# Patient Record
Sex: Female | Born: 1975 | Race: Black or African American | Hispanic: No | Smoking: Current every day smoker
Health system: Southern US, Community
[De-identification: ages and names within clinical notes are randomized; demographics above are authoritative.]

## PROBLEM LIST (undated history)

## (undated) ENCOUNTER — Emergency Department (HOSPITAL_COMMUNITY): Payer: Medicaid Other

## (undated) DIAGNOSIS — E7401 von Gierke disease: Secondary | ICD-10-CM

## (undated) DIAGNOSIS — F319 Bipolar disorder, unspecified: Secondary | ICD-10-CM

## (undated) DIAGNOSIS — F22 Delusional disorders: Secondary | ICD-10-CM

## (undated) DIAGNOSIS — F209 Schizophrenia, unspecified: Secondary | ICD-10-CM

## (undated) DIAGNOSIS — D550 Anemia due to glucose-6-phosphate dehydrogenase [G6PD] deficiency: Secondary | ICD-10-CM

## (undated) DIAGNOSIS — F99 Mental disorder, not otherwise specified: Secondary | ICD-10-CM

## (undated) DIAGNOSIS — D649 Anemia, unspecified: Secondary | ICD-10-CM

## (undated) DIAGNOSIS — F32A Depression, unspecified: Secondary | ICD-10-CM

## (undated) DIAGNOSIS — F419 Anxiety disorder, unspecified: Secondary | ICD-10-CM

## (undated) DIAGNOSIS — Z5189 Encounter for other specified aftercare: Secondary | ICD-10-CM

## (undated) HISTORY — PX: TUBAL LIGATION: SHX77

---

## 2009-03-13 ENCOUNTER — Emergency Department (HOSPITAL_COMMUNITY): Admission: EM | Admit: 2009-03-13 | Discharge: 2009-03-13 | Payer: Self-pay | Admitting: Emergency Medicine

## 2009-03-28 HISTORY — PX: CHOLECYSTECTOMY: SHX55

## 2010-03-25 ENCOUNTER — Inpatient Hospital Stay (HOSPITAL_COMMUNITY)
Admission: EM | Admit: 2010-03-25 | Discharge: 2010-03-29 | Payer: Self-pay | Source: Home / Self Care | Attending: Internal Medicine | Admitting: Internal Medicine

## 2010-03-26 ENCOUNTER — Encounter (INDEPENDENT_AMBULATORY_CARE_PROVIDER_SITE_OTHER): Payer: Self-pay | Admitting: Internal Medicine

## 2010-06-07 LAB — CBC
HCT: 17.6 % — ABNORMAL LOW (ref 36.0–46.0)
HCT: 18.8 % — ABNORMAL LOW (ref 36.0–46.0)
HCT: 30 % — ABNORMAL LOW (ref 36.0–46.0)
Hemoglobin: 4.3 g/dL — CL (ref 12.0–15.0)
Hemoglobin: 4.7 g/dL — CL (ref 12.0–15.0)
Hemoglobin: 7.9 g/dL — ABNORMAL LOW (ref 12.0–15.0)
Hemoglobin: 8 g/dL — ABNORMAL LOW (ref 12.0–15.0)
Hemoglobin: 8.8 g/dL — ABNORMAL LOW (ref 12.0–15.0)
MCH: 16.7 pg — ABNORMAL LOW (ref 26.0–34.0)
MCH: 17.1 pg — ABNORMAL LOW (ref 26.0–34.0)
MCH: 21.7 pg — ABNORMAL LOW (ref 26.0–34.0)
MCH: 21.8 pg — ABNORMAL LOW (ref 26.0–34.0)
MCH: 21.9 pg — ABNORMAL LOW (ref 26.0–34.0)
MCH: 22.3 pg — ABNORMAL LOW (ref 26.0–34.0)
MCHC: 24.4 g/dL — ABNORMAL LOW (ref 30.0–36.0)
MCHC: 25 g/dL — ABNORMAL LOW (ref 30.0–36.0)
MCHC: 28.9 g/dL — ABNORMAL LOW (ref 30.0–36.0)
MCHC: 29.3 g/dL — ABNORMAL LOW (ref 30.0–36.0)
MCHC: 29.3 g/dL — ABNORMAL LOW (ref 30.0–36.0)
MCV: 68.4 fL — ABNORMAL LOW (ref 78.0–100.0)
MCV: 68.5 fL — ABNORMAL LOW (ref 78.0–100.0)
MCV: 74.2 fL — ABNORMAL LOW (ref 78.0–100.0)
MCV: 75.2 fL — ABNORMAL LOW (ref 78.0–100.0)
Platelets: 279 10*3/uL (ref 150–400)
Platelets: 346 10*3/uL (ref 150–400)
Platelets: 425 10*3/uL — ABNORMAL HIGH (ref 150–400)
Platelets: 456 10*3/uL — ABNORMAL HIGH (ref 150–400)
RBC: 2.57 MIL/uL — ABNORMAL LOW (ref 3.87–5.11)
RBC: 2.75 MIL/uL — ABNORMAL LOW (ref 3.87–5.11)
RBC: 3.68 MIL/uL — ABNORMAL LOW (ref 3.87–5.11)
RDW: 23.6 % — ABNORMAL HIGH (ref 11.5–15.5)
RDW: 25.6 % — ABNORMAL HIGH (ref 11.5–15.5)
RDW: 25.7 % — ABNORMAL HIGH (ref 11.5–15.5)
WBC: 10 10*3/uL (ref 4.0–10.5)
WBC: 14 10*3/uL — ABNORMAL HIGH (ref 4.0–10.5)
WBC: 9.3 10*3/uL (ref 4.0–10.5)

## 2010-06-07 LAB — CROSSMATCH
ABO/RH(D): A POS
Antibody Screen: NEGATIVE
Unit division: 0
Unit division: 0
Unit division: 0

## 2010-06-07 LAB — POCT I-STAT, CHEM 8
BUN: 9 mg/dL (ref 6–23)
Calcium, Ion: 1.18 mmol/L (ref 1.12–1.32)
Chloride: 109 mEq/L (ref 96–112)
Creatinine, Ser: 0.9 mg/dL (ref 0.4–1.2)
Glucose, Bld: 80 mg/dL (ref 70–99)
HCT: 18 % — ABNORMAL LOW (ref 36.0–46.0)
Hemoglobin: 6.1 g/dL — CL (ref 12.0–15.0)
Potassium: 4.1 mEq/L (ref 3.5–5.1)
Sodium: 143 mEq/L (ref 135–145)
TCO2: 24 mmol/L (ref 0–100)

## 2010-06-07 LAB — COMPREHENSIVE METABOLIC PANEL
AST: 16 U/L (ref 0–37)
Albumin: 2.9 g/dL — ABNORMAL LOW (ref 3.5–5.2)
BUN: 7 mg/dL (ref 6–23)
CO2: 23 mEq/L (ref 19–32)
Calcium: 8.1 mg/dL — ABNORMAL LOW (ref 8.4–10.5)
Chloride: 109 mEq/L (ref 96–112)
Creatinine, Ser: 0.69 mg/dL (ref 0.4–1.2)
Creatinine, Ser: 0.94 mg/dL (ref 0.4–1.2)
GFR calc Af Amer: >60 mL/min (ref >60)
GFR calc Af Amer: >60 mL/min (ref >60)
GFR calc non Af Amer: >60 mL/min (ref >60)
Glucose, Bld: 90 mg/dL (ref 70–99)
Potassium: 3.3 mEq/L — ABNORMAL LOW (ref 3.5–5.1)
Total Bilirubin: 0.6 mg/dL (ref 0.3–1.2)

## 2010-06-07 LAB — URINE MICROSCOPIC-ADD ON

## 2010-06-07 LAB — DIFFERENTIAL
Basophils Absolute: 0.1 10*3/uL (ref 0.0–0.1)
Basophils Absolute: 0.1 10*3/uL (ref 0.0–0.1)
Basophils Relative: 1 % (ref 0–1)
Eosinophils Absolute: 0.1 10*3/uL (ref 0.0–0.7)
Eosinophils Absolute: 0.2 10*3/uL (ref 0.0–0.7)
Eosinophils Relative: 2 % (ref 0–5)
Lymphocytes Relative: 12 % (ref 12–46)
Lymphocytes Relative: 24 % (ref 12–46)
Lymphs Abs: 2.2 10*3/uL (ref 0.7–4.0)
Monocytes Absolute: 1 10*3/uL (ref 0.1–1.0)
Monocytes Absolute: 1 10*3/uL (ref 0.1–1.0)
Monocytes Relative: 11 % (ref 3–12)
Neutro Abs: 5.8 10*3/uL (ref 1.7–7.7)
Neutrophils Relative %: 62 % (ref 43–77)
Neutrophils Relative %: 79 % — ABNORMAL HIGH (ref 43–77)

## 2010-06-07 LAB — RAPID URINE DRUG SCREEN, HOSP PERFORMED
Amphetamines: NOT DETECTED
Barbiturates: NOT DETECTED
Benzodiazepines: NOT DETECTED
Cocaine: NOT DETECTED
Opiates: NOT DETECTED
Tetrahydrocannabinol: POSITIVE — AB

## 2010-06-07 LAB — URINALYSIS, ROUTINE W REFLEX MICROSCOPIC
Bilirubin Urine: NEGATIVE
Glucose, UA: NEGATIVE mg/dL
Hgb urine dipstick: NEGATIVE
Nitrite: NEGATIVE
Protein, ur: NEGATIVE mg/dL
Specific Gravity, Urine: 1.023 (ref 1.005–1.030)
Urobilinogen, UA: 1 mg/dL (ref 0.0–1.0)
pH: 7 (ref 5.0–8.0)

## 2010-06-07 LAB — PREGNANCY, URINE: Preg Test, Ur: NEGATIVE

## 2010-06-07 LAB — PREPARE RBC (CROSSMATCH)

## 2010-06-07 LAB — ABO/RH: ABO/RH(D): A POS

## 2010-06-07 LAB — VITAMIN B12: Vitamin B-12: 292 pg/mL (ref 211–911)

## 2010-06-07 LAB — LACTATE DEHYDROGENASE
LDH: 113 U/L (ref 94–250)
LDH: 212 U/L (ref 94–250)

## 2010-06-07 LAB — IRON AND TIBC
Iron: <10 ug/dL — ABNORMAL LOW (ref 42–135)
UIBC: 403 ug/dL

## 2010-06-07 LAB — FERRITIN: Ferritin: 2 ng/mL — ABNORMAL LOW (ref 10–291)

## 2010-06-07 LAB — BASIC METABOLIC PANEL
CO2: 26 mEq/L (ref 19–32)
Calcium: 8.3 mg/dL — ABNORMAL LOW (ref 8.4–10.5)
Chloride: 111 mEq/L (ref 96–112)
Creatinine, Ser: 0.72 mg/dL (ref 0.4–1.2)
Glucose, Bld: 86 mg/dL (ref 70–99)

## 2010-06-07 LAB — FOLATE: Folate: 7.8 ng/mL

## 2010-06-07 LAB — CARDIAC PANEL(CRET KIN+CKTOT+MB+TROPI)
CK, MB: 0.4 ng/mL (ref 0.3–4.0)
Relative Index: INVALID (ref 0.0–2.5)
Relative Index: INVALID (ref 0.0–2.5)
Total CK: 47 U/L (ref 7–177)
Total CK: 61 U/L (ref 7–177)
Troponin I: <0.01 ng/mL (ref 0.00–0.06)

## 2010-06-07 LAB — GLUCOSE, CAPILLARY: Glucose-Capillary: 87 mg/dL (ref 70–99)

## 2010-06-07 LAB — CK: Total CK: 59 U/L (ref 7–177)

## 2010-06-07 LAB — MAGNESIUM: Magnesium: 2.1 mg/dL (ref 1.5–2.5)

## 2010-06-07 LAB — RETICULOCYTES: Retic Ct Pct: 1.8 % (ref 0.4–3.1)

## 2010-06-07 LAB — OCCULT BLOOD, POC DEVICE: Fecal Occult Bld: NEGATIVE

## 2010-06-12 ENCOUNTER — Emergency Department (HOSPITAL_COMMUNITY): Payer: Self-pay

## 2010-06-12 ENCOUNTER — Inpatient Hospital Stay (HOSPITAL_COMMUNITY)
Admission: EM | Admit: 2010-06-12 | Discharge: 2010-06-15 | DRG: 812 | Disposition: A | Discharge status: Home / Self Care | Payer: Self-pay | Attending: Internal Medicine | Admitting: Internal Medicine

## 2010-06-12 DIAGNOSIS — F17200 Nicotine dependence, unspecified, uncomplicated: Secondary | ICD-10-CM | POA: Diagnosis present

## 2010-06-12 DIAGNOSIS — R51 Headache: Secondary | ICD-10-CM | POA: Diagnosis present

## 2010-06-12 DIAGNOSIS — D6489 Other specified anemias: Principal | ICD-10-CM | POA: Diagnosis present

## 2010-06-12 DIAGNOSIS — E860 Dehydration: Secondary | ICD-10-CM | POA: Diagnosis present

## 2010-06-12 DIAGNOSIS — R112 Nausea with vomiting, unspecified: Secondary | ICD-10-CM | POA: Diagnosis present

## 2010-06-12 DIAGNOSIS — K5900 Constipation, unspecified: Secondary | ICD-10-CM | POA: Diagnosis present

## 2010-06-12 DIAGNOSIS — D259 Leiomyoma of uterus, unspecified: Secondary | ICD-10-CM | POA: Diagnosis present

## 2010-06-12 DIAGNOSIS — R109 Unspecified abdominal pain: Secondary | ICD-10-CM | POA: Diagnosis present

## 2010-06-12 DIAGNOSIS — N920 Excessive and frequent menstruation with regular cycle: Secondary | ICD-10-CM | POA: Diagnosis present

## 2010-06-12 DIAGNOSIS — R42 Dizziness and giddiness: Secondary | ICD-10-CM | POA: Diagnosis present

## 2010-06-12 DIAGNOSIS — D551 Anemia due to other disorders of glutathione metabolism: Secondary | ICD-10-CM | POA: Diagnosis present

## 2010-06-12 DIAGNOSIS — I959 Hypotension, unspecified: Secondary | ICD-10-CM | POA: Diagnosis not present

## 2010-06-12 DIAGNOSIS — N390 Urinary tract infection, site not specified: Secondary | ICD-10-CM | POA: Diagnosis present

## 2010-06-12 LAB — COMPREHENSIVE METABOLIC PANEL
ALT: 10 U/L (ref 0–35)
AST: 18 U/L (ref 0–37)
Alkaline Phosphatase: 44 U/L (ref 39–117)
CO2: 24 mEq/L (ref 19–32)
Calcium: 8.8 mg/dL (ref 8.4–10.5)
GFR calc Af Amer: >60 mL/min (ref >60)
GFR calc non Af Amer: >60 mL/min (ref >60)
Glucose, Bld: 84 mg/dL (ref 70–99)
Potassium: 4 mEq/L (ref 3.5–5.1)
Sodium: 136 mEq/L (ref 135–145)

## 2010-06-12 LAB — DIFFERENTIAL
Basophils Absolute: 0.1 10*3/uL (ref 0.0–0.1)
Eosinophils Absolute: 0.3 10*3/uL (ref 0.0–0.7)
Lymphocytes Relative: 25 % (ref 12–46)
Monocytes Relative: 8 % (ref 3–12)
Neutrophils Relative %: 63 % (ref 43–77)

## 2010-06-12 LAB — URINALYSIS, ROUTINE W REFLEX MICROSCOPIC
Bilirubin Urine: NEGATIVE
Glucose, UA: NEGATIVE mg/dL
Ketones, ur: NEGATIVE mg/dL
Protein, ur: NEGATIVE mg/dL
pH: 6 (ref 5.0–8.0)

## 2010-06-12 LAB — CBC
HCT: 22.6 % — ABNORMAL LOW (ref 36.0–46.0)
Hemoglobin: 6.1 g/dL — CL (ref 12.0–15.0)
MCHC: 27 g/dL — ABNORMAL LOW (ref 30.0–36.0)
RBC: 3.26 MIL/uL — ABNORMAL LOW (ref 3.87–5.11)
WBC: 8.5 10*3/uL (ref 4.0–10.5)

## 2010-06-12 LAB — URINE MICROSCOPIC-ADD ON

## 2010-06-12 LAB — LIPASE, BLOOD: Lipase: 27 U/L (ref 11–59)

## 2010-06-12 LAB — OCCULT BLOOD, POC DEVICE: Fecal Occult Bld: NEGATIVE

## 2010-06-12 LAB — SAMPLE TO BLOOD BANK

## 2010-06-12 MED ORDER — IOHEXOL 300 MG/ML  SOLN
100.0000 mL | Freq: Once | INTRAMUSCULAR | Status: AC | PRN
  Administered 2010-06-12: 100 mL via INTRAVENOUS

## 2010-06-12 NOTE — H&P (Signed)
Valerie Lee, Valerie Lee                 ACCOUNT NO.:  [id number]  MEDICAL RECORD NO.:  [id number]           PATIENT TYPE:  I  LOCATION:  1320                         FACILITY:  [facility]  PHYSICIAN:  Andreas Blower, MD       DATE OF BIRTH:  1976-02-23  DATE OF ADMISSION:  06/12/2010 DATE OF DISCHARGE:                             HISTORY & PHYSICAL   The patient does not have a primary care physician.  CHIEF COMPLAINT:  Headache, nausea, vomiting, and abdominal pain.  HISTORY OF PRESENT ILLNESS:  Valerie Lee is a 35 year old African American female with history of anemia, history of uterine fibroids, menorrhagia secondary to fibroids, has received multiple blood transfusions in the past, history of headaches and G6PD deficiency, who presents with the above complaints.  The patient was recently hospitalized from December 30 until March 29, 2010, for anemia, at which time she had received 3 units of packed red blood cells.  Her hemoglobin had improved to 8.8. She had initially presented with a hemoglobin of 4.3.  Subsequently, prior to discharge, the patient was given strict instructions to establish with a PCP and a gynecologist; however, the patient has not done that due to financial reasons.  She again presents with similar complaints as her previous hospitalization.  In the ER, she was again found to be anemic with a hemoglobin of 6.1.  As a result, the hospitalist service was asked to admit the patient for further management.  She denies any recent fevers or chills.  Does report nausea, vomiting over the last 2 to 3 days, mainly vomits clear emesis. Denies any blood when she vomits.  She does report that in the last 2 to 3 days, she is able to eat food and is able to keep things down.  She also complains about having a headache initially thought that it was due to her migraines.  Her headache since then has improved.  The headache is located mainly in the posterior region.  She does report  has been constipated, has not had a bowel movement in the last 2 days.  The patient denies any chest pain or shortness of breath.  REVIEW OF SYSTEMS:  All systems were reviewed with the patient and are positive as per HPI.  Otherwise all other systems are negative.  PAST MEDICAL HISTORY: 1. G6PD deficiency. 2. Anemia likely due to uterine fibroids and menorrhagia.  The patient     reports that she has had multiple blood transfusions.  Reports that     she has had at least 7 transfusions so far. 3. Menorrhagia secondary to uterine fibroids. 4. Asthma. 5. Cholelithiasis. 6. Uterine fibroids. 7. Tobacco use.  SOCIAL HISTORY:  The patient smokes half pack per day.  Denies any alcohol use.  Denies any illegal drugs or substances.  Currently not working.  Has recently moved from [place] over the last year.  FAMILY HISTORY:  Significant for having 2 children who have asthma, and mother has asthma.  Mother also has breast cancer and bipolar disorder.  HOME MEDICATIONS: 1. Tylenol Extra Strength 1000 mg every 6 hours as needed.  2. Iron over-the-counter 1 tablet p.o. twice daily. 3. Albuterol inhaler 2 puffs every 4 hours as needed for shortness of     breath.  PHYSICAL EXAM:  VITALS:  Temperature is 98.4; blood pressure is 91/55, initially was 117/74; heart rate 77; respiration 18; saturating at 100% on room air. GENERAL:  The patient was alert, oriented, did not to be any acute distress, was lying in bed comfortably. HEENT:  Extraocular motions are intact.  Pupils equal, round.  Moist mucous membranes. NECK:  Supple. HEART:  Regular with S1 and S2. LUNGS:  Clear to auscultation bilaterally. ABDOMEN:  Soft, nondistended.  Positive bowel sounds.  Had some mild tenderness in the epigastric region.  No guarding or rebound tenderness. EXTREMITIES:  Good peripheral pulses with trace edema. NEURO:  Cranial nerves II-XII grossly intact.  Had 5/5 motor strength in upper as well as  lower extremities.  RADIOLOGY/IMAGING: 1. The patient had a CT of the head without contrast which showed     normal examination. 2. The patient had a CT of the abdomen and pelvis, with contrast,     which showed cholelithiasis.  Minimal pericholecystic fluid with     improvement.  Minimal diffuse gallbladder wall thickening and     enhancement unchanged.  Stable enlarged uterus containing multiple     enhancing fibroids.  LABORATORY DATA:  CBC shows white count of 8.5, hemoglobin 6.1, hematocrit 26.7, platelet count 383.  Electrolytes normal with a creatinine of 0.64, lipase is 2.  Liver function tests normal.  UA was negative for nitrates, had small leukocytes, many squamous epithelial cells.  Fecal occult is negative.  Urine culture is pending.  ASSESSMENT AND PLAN: 1. Anemia, symptomatic at this time.  Will transfuse her 2 units of     blood, suspect her anemia is most likely due to menorrhagia and     G6PD deficiency. 2. Nausea and vomiting, most likely secondary to severe anemia.  The     patient did not have any pain in the right upper quadrant with     palpation, suspect less likely due to cholelithiasis, maybe due to     uterine fibroids. 3. Cholelithiasis based on CT scan, unchanged from previous imaging.     Her G6PD deficiency may be contributing to her cholelithiasis. 4. Menorrhagia secondary to fibroids:  This has been an ongoing issue     for the patient.  Suspect the patient will need a gynecology     evaluation during the hospitalization for long-term management of     her anemia. 5. Uterine fibroids:  Management as indicated above, may need a     gynecology evaluation. 6. G6PD deficiency, stable. 7. Mild dehydration:  Will give her IV fluids. 8. Mild hypotension secondary to anemia:  Will give her IV fluids. 9. Constipation:  Will have her on a bowel regimen. 10.Tobacco use: Encouraged cessation. 11.Prophylaxis:  Sequential compressive devices for deep venous      thrombosis prophylaxis.  No heparin due to anemia. 12.Code status:  The patient is Full Code.  Time spent on admission, talking to the patient and coordinating care was 45 minutes.   Andreas Blower, MD   SR/MEDQ  D:  06/12/2010  T:  06/12/2010  Job:  [id number]  Electronically Signed by Wardell Heath Sunnie Odden  on 06/12/2010 11:21:55 PM

## 2010-06-13 LAB — CBC
MCHC: 29 g/dL — ABNORMAL LOW (ref 30.0–36.0)
MCV: 72.5 fL — ABNORMAL LOW (ref 78.0–100.0)
Platelets: 289 10*3/uL (ref 150–400)
RDW: 23.1 % — ABNORMAL HIGH (ref 11.5–15.5)
WBC: 10.7 10*3/uL — ABNORMAL HIGH (ref 4.0–10.5)

## 2010-06-13 LAB — BASIC METABOLIC PANEL
BUN: 5 mg/dL — ABNORMAL LOW (ref 6–23)
Calcium: 8.4 mg/dL (ref 8.4–10.5)
GFR calc non Af Amer: >60 mL/min (ref >60)
Potassium: 3.5 mEq/L (ref 3.5–5.1)

## 2010-06-14 LAB — URINE CULTURE: Colony Count: 75000

## 2010-06-14 LAB — CBC
HCT: 26.6 % — ABNORMAL LOW (ref 36.0–46.0)
Hemoglobin: 7.6 g/dL — ABNORMAL LOW (ref 12.0–15.0)
MCH: 21 pg — ABNORMAL LOW (ref 26.0–34.0)
MCV: 73.5 fL — ABNORMAL LOW (ref 78.0–100.0)
Platelets: 235 10*3/uL (ref 150–400)
RBC: 3.62 MIL/uL — ABNORMAL LOW (ref 3.87–5.11)

## 2010-06-14 LAB — URINALYSIS, ROUTINE W REFLEX MICROSCOPIC
Bilirubin Urine: NEGATIVE
Glucose, UA: NEGATIVE mg/dL
Hgb urine dipstick: NEGATIVE
Ketones, ur: NEGATIVE mg/dL
Protein, ur: NEGATIVE mg/dL
pH: 7.5 (ref 5.0–8.0)

## 2010-06-14 LAB — BASIC METABOLIC PANEL
BUN: 3 mg/dL — ABNORMAL LOW (ref 6–23)
CO2: 26 mEq/L (ref 19–32)
Chloride: 111 mEq/L (ref 96–112)
Creatinine, Ser: 0.67 mg/dL (ref 0.4–1.2)
Glucose, Bld: 85 mg/dL (ref 70–99)
Potassium: 3.9 mEq/L (ref 3.5–5.1)

## 2010-06-15 LAB — CBC
HCT: 31.4 % — ABNORMAL LOW (ref 36.0–46.0)
Hemoglobin: 9.6 g/dL — ABNORMAL LOW (ref 12.0–15.0)
MCH: 22.9 pg — ABNORMAL LOW (ref 26.0–34.0)
MCHC: 30.6 g/dL (ref 30.0–36.0)
RBC: 4.19 MIL/uL (ref 3.87–5.11)

## 2010-06-15 LAB — URINE CULTURE
Culture  Setup Time: 201203191503
Culture: NO GROWTH
Special Requests: NEGATIVE

## 2010-06-15 LAB — BASIC METABOLIC PANEL
CO2: 25 mEq/L (ref 19–32)
Calcium: 8.3 mg/dL — ABNORMAL LOW (ref 8.4–10.5)
Chloride: 110 mEq/L (ref 96–112)
Creatinine, Ser: 0.58 mg/dL (ref 0.4–1.2)
GFR calc Af Amer: >60 mL/min (ref >60)
Glucose, Bld: 85 mg/dL (ref 70–99)
Sodium: 139 mEq/L (ref 135–145)

## 2010-06-15 LAB — CROSSMATCH: Unit division: 0

## 2010-06-17 NOTE — Discharge Summary (Signed)
Valerie Lee, Valerie Lee                 ACCOUNT NO.:  [id number]  MEDICAL RECORD NO.:  [id number]           PATIENT TYPE:  I  LOCATION:  1320                         FACILITY:  [facility]  PHYSICIAN:  Thad Ranger, MD       DATE OF BIRTH:  04-07-1975  DATE OF ADMISSION:  06/12/2010 DATE OF DISCHARGE:  06/15/2010                              DISCHARGE SUMMARY   DISCHARGE DIAGNOSES: 1. Symptomatic anemia, most likely secondary to menorrhagia, uterine     fibroids, and glucose-6-phosphate transporter deficiency. 2. Nausea and vomiting, improved. 3. Uterine fibroids. 4. Dehydration, improved. 5. Mild vertigo, improved. 6. Tobacco use.  DISCHARGE MEDICATIONS: 1. Ferrous gluconate 325 mg p.o. b.i.d. 2. Meclizine 25 mg p.o. b.i.d. for 5 days then as needed for vertigo. 3. Oxycodone/APAP 5/325 mg 1-2 tablets every 6 hours as needed for     pain, headache. 4. Tylenol Extra Strength 500 mg 2 tablets p.o. every 6 hours as     needed for pain. 5. Ventolin inhaler 2 puffs inhaled q.4 h. as needed for shortness of     breath.  BRIEF HISTORY OF PRESENT ILLNESS:  At the time of admission briefly, Valerie Lee is a 35 year old female with a history of anemia, uterine fibroids, menorrhagia secondary to fibroids has received multiple blood transfusions in the past, history of headache, and G6PT deficiency presented with above complaints.  She was recently hospitalized on December 30th through January 2nd for similar presentation with anemia. For details, please refer to the admission note dictated by Dr. Andreas Blower on June 12, 2010.  RADIOLOGICAL DATA: 1. CT head without contrast, March 17th, normal exam. 2. CT abdomen and pelvis, March 17th, showed,     a.     Cholelithiasis.     b.     Minimal pericholecystic fluid with improvement.     c.     Minimal diffuse gallbladder wall thickening and enhancement,      unchanged.     d.     Stable enlarged uterus, containing multiple enhancing  fibroids.  BRIEF HOSPITALIZATION COURSE: 1. Symptomatic anemia secondary to menorrhagia and G6PT deficiency.     The patient got transfused 3 units of packed RBC transfusion.  At     the time of admission, the hemoglobin was 6.1 and at the time of     discharge has improved to 9.6 with hematocrit of 31.4.  CT abdomen     and pelvis did show multiple enhancing fibroids.  The patient     stated that after discharge from the previous admission, she did     not follow up with Gynecology as was recommended at that time in     January 2012.  PCP was established for the patient at [facility]     and importance of establishing with GYN was also discussed with the     patient. She was also given 1 IV Venofer during the     hospitalization. 2. Uterine fibroids and menorrhagia.  Currently, no active bleeding.     However, it was stressed to the patient extensively to obtain  outpatient GYN evaluation at [facility].  The patient     may eventually require hysterectomy given her multiple admissions     and transfusions for symptomatic anemia.  Headaches, multifactorial     in nature with the history of chronic headaches.  At this point,     has improved.  The patient is on pain medications for the pain.  CT     head was obtained which was negative for any acute intracranial     abnormality. 3. History of G6PT deficiency.  No hemolysis.  The patient's     hematocrit appropriately improved after the blood transfusion. 4. Tobacco abuse.  The patient was encouraged tobacco cessation and     counseled.  She, however, declined NicoDerm patches at the time of     discharge.  PHYSICAL EXAMINATION:  VITAL SIGNS:  At the time of discharge, temperature 98.2, pulse 56, respirations 16, blood pressure 98/60, O2 saturations 100% on room air. GENERAL:  The patient is alert, awake, and oriented x3, not in acute distress. HEENT:  Anicteric sclerae, pale conjunctivae.  Pupils reactive to light and  accommodation.  EOMI. NECK:  Supple.  No lymphadenopathy.  No JVD. CVS:  S1 and S2 clear. CHEST:  Clear to auscultation bilaterally. ABDOMEN:  Soft, nontender, nondistended.  Normal bowel sounds. EXTREMITIES:  No cyanosis, clubbing, or edema noted on upper or lower extremities.  DISCHARGE FOLLOWUP:  At the [facility], Dr. Daphine Deutscher on April 24 at 8:30 a.m.  She has eligibility appointment on April 12th at 10:40 a.m.  This was explained in great detail to the patient to keep up with her appointments.  DISCHARGE TIME:  35 minutes.     Thad Ranger, MD     RR/MEDQ  D:  06/15/2010  T:  06/15/2010  Job:  [id number]  Electronically Signed by Andres Labrum Rithik Odea  on 06/17/2010 05:37:50 PM

## 2010-07-14 ENCOUNTER — Emergency Department (HOSPITAL_COMMUNITY): Payer: Self-pay

## 2010-07-14 ENCOUNTER — Emergency Department (HOSPITAL_COMMUNITY)
Admission: EM | Admit: 2010-07-14 | Discharge: 2010-07-14 | Disposition: A | Discharge status: Home / Self Care | Payer: Self-pay | Attending: Emergency Medicine | Admitting: Emergency Medicine

## 2010-07-14 DIAGNOSIS — R300 Dysuria: Secondary | ICD-10-CM | POA: Insufficient documentation

## 2010-07-14 DIAGNOSIS — K8050 Calculus of bile duct without cholangitis or cholecystitis without obstruction: Secondary | ICD-10-CM | POA: Insufficient documentation

## 2010-07-14 DIAGNOSIS — D551 Anemia due to other disorders of glutathione metabolism: Secondary | ICD-10-CM | POA: Insufficient documentation

## 2010-07-14 DIAGNOSIS — R112 Nausea with vomiting, unspecified: Secondary | ICD-10-CM | POA: Insufficient documentation

## 2010-07-14 DIAGNOSIS — R42 Dizziness and giddiness: Secondary | ICD-10-CM | POA: Insufficient documentation

## 2010-07-14 DIAGNOSIS — N83209 Unspecified ovarian cyst, unspecified side: Secondary | ICD-10-CM | POA: Insufficient documentation

## 2010-07-14 DIAGNOSIS — D259 Leiomyoma of uterus, unspecified: Secondary | ICD-10-CM | POA: Insufficient documentation

## 2010-07-14 DIAGNOSIS — R109 Unspecified abdominal pain: Secondary | ICD-10-CM | POA: Insufficient documentation

## 2010-07-14 LAB — CBC
HCT: 32.8 % — ABNORMAL LOW (ref 36.0–46.0)
Hemoglobin: 10.4 g/dL — ABNORMAL LOW (ref 12.0–15.0)
MCH: 27.2 pg (ref 26.0–34.0)
MCHC: 31.7 g/dL (ref 30.0–36.0)
MCV: 85.9 fL (ref 78.0–100.0)
Platelets: 322 10*3/uL (ref 150–400)
RBC: 3.82 MIL/uL — ABNORMAL LOW (ref 3.87–5.11)
RDW: 28.8 % — ABNORMAL HIGH (ref 11.5–15.5)
WBC: 9.6 10*3/uL (ref 4.0–10.5)

## 2010-07-14 LAB — DIFFERENTIAL
Basophils Absolute: 0.1 10*3/uL (ref 0.0–0.1)
Eosinophils Absolute: 0.2 10*3/uL (ref 0.0–0.7)
Lymphs Abs: 2 10*3/uL (ref 0.7–4.0)
Monocytes Relative: 6 % (ref 3–12)
Neutro Abs: 6.7 10*3/uL (ref 1.7–7.7)

## 2010-07-14 LAB — COMPREHENSIVE METABOLIC PANEL
ALT: 12 U/L (ref 0–35)
AST: 20 U/L (ref 0–37)
Albumin: 3.6 g/dL (ref 3.5–5.2)
Calcium: 9.1 mg/dL (ref 8.4–10.5)
Creatinine, Ser: 0.75 mg/dL (ref 0.4–1.2)
GFR calc Af Amer: >60 mL/min (ref >60)
GFR calc non Af Amer: >60 mL/min (ref >60)
Sodium: 139 mEq/L (ref 135–145)
Total Protein: 6.9 g/dL (ref 6.0–8.3)

## 2010-07-14 LAB — URINALYSIS, ROUTINE W REFLEX MICROSCOPIC
Bilirubin Urine: NEGATIVE
Glucose, UA: NEGATIVE mg/dL
Hgb urine dipstick: NEGATIVE
Nitrite: NEGATIVE
Specific Gravity, Urine: 1.012 (ref 1.005–1.030)
pH: 7 (ref 5.0–8.0)

## 2010-07-14 LAB — POCT PREGNANCY, URINE: Preg Test, Ur: NEGATIVE

## 2010-07-14 LAB — URINE MICROSCOPIC-ADD ON

## 2010-07-14 MED ORDER — IOHEXOL 300 MG/ML  SOLN
100.0000 mL | Freq: Once | INTRAMUSCULAR | Status: AC | PRN
  Administered 2010-07-14: 100 mL via INTRAVENOUS

## 2010-07-20 NOTE — Consult Note (Signed)
Valerie Lee                 ACCOUNT NO.:  [id number]  MEDICAL RECORD NO.:  [id number]          PATIENT TYPE:  LOCATION:                                 FACILITY:  PHYSICIAN:  Angelia Mould. Derrell Lolling, M.D.DATE OF BIRTH:  12/17/1975  DATE OF CONSULTATION:  07/14/2010 DATE OF DISCHARGE:                                CONSULTATION   REQUESTING PHYSICIAN:  Kennon Rounds, MD  PRIMARY CARE PHYSICIAN:  [facility].  CHIEF COMPLAINT:  Abdominal pain.  HISTORY OF PRESENT ILLNESS:  [name] is a 35 year old black female with a history of G6PD deficiency anemia and asthma who developed diffuse abdominal pain last night.  She states her pain did not start at one specific spot, it started diffusely.  She subsequently developed some nausea as well as 2 episodes of emesis last night and one episode of emesis this morning.  She denies any fevers or chills.  She denies any prior abdominal pain similar to this in the past except for some lower abdominal pain she has had in the past due to her fibroids.  Otherwise, the patient had a normal bowel movement this morning with no hematochezia.  She does state that her pain is worse after eating but states it is worse at her umbilicus.  Currently, the patient states that no one specific spot hurts worse than the other but it still hurts diffusely.  She presented to the emergency department due to continuing abdominal pain.  Upon arrival, she had a workup which completed an acute abdominal series which showed no active cardiopulmonary disease and cholelithiasis but otherwise  negative abdominal series.  She did have ultrasound of the abdomen which revealed gallstones but no gallbladder wall thickening.  Her common bile duct was in the upper limits of normal with the caliber of 6 mm and no evidence of choledocholithiasis. Otherwise, her ultrasound was negative.  Due to some tenderness over the gallbladder area, they could not rule out early acute  cholecystitis but no active ultrasonic evidence of cholecystitis.  Because of this, we were asked to evaluate the patient for further recommendations.  REVIEW OF SYSTEMS:  Please see HPI.  Otherwise, all other systems have been reviewed and are negative.  FAMILY HISTORY:  Noncontributory.  PAST MEDICAL HISTORY: 1. G6PD deficiency anemia. 2. Asthma. 3. Known gallstones. 4. Uterine fibroids.  PAST SURGICAL HISTORY:  Tubal ligation.  SOCIAL HISTORY:  The patient is married, with 6 children.  She denies any alcohol or illicit drug abuse.  She admits to tobacco use on a daily basis approximately 3 cigarettes a day.  ALLERGIES:  BACTRIM and PENICILLIN which both cause throat swelling and difficulty breathing as well as a rash.  MEDICATIONS:  Iron, dose is unknown.  PHYSICAL EXAMINATION:  GENERAL:  [name] is a 35 year old black female who is currently lying in bed, in no obvious distress. VITAL SIGNS:  Temperature 97.7, pulse 61, respirations 16, blood pressure 96/64. HEENT:  Head is normocephalic, atraumatic.  Sclerae noninjected.  Pupils are equal, round and reactive to light.  Ears and nose without any obvious masses or lesions.  No rhinorrhea.  Mouth is pink. HEART:  Regular rate and rhythm.  Normal S1, S2.  No murmurs, gallops or rubs are noted.  She does have palpable carotid and pedal pulses bilaterally. LUNGS:  Clear to auscultation bilaterally with no wheezes, rhonchi or rales noted.  Respiratory effort is nonlabored. ABDOMEN:  Soft but diffusely tender with active, but variable voluntary guarding.  She does have hypoactive bowel sounds is nondistended.  There is no one spot which is more tender than any others.  She is not tympanitic and does not have any hernias or organomegaly noted. There is a fullness in the suprapubic area, and I question whether this is the uterus. MUSCULOSKELETAL:  All 4 extremities are symmetrical with no cyanosis, clubbing or edema. NEURO:   Cranial nerves II through XII appear to be grossly intact.  Deep tendon reflex exam is deferred at this time. PSYCH:  The patient is alert and oriented x3 with an appropriate affect.  LABORATORY DATA:  White blood cell count is 9600, hemoglobin 10.4, hematocrit 32.8, platelet count is 322,000.  Sodium 139, potassium 3.8, glucose 81, BUN 10, creatinine 0.75, AST 20, ALT 12, alkaline phosphatase 36, total bilirubin 0.3, lipase is 27.  DIAGNOSTICS:  Ultrasound of the abdomen revealed gallstones within the gallbladder, largest measuring 1.8 cm but no wall thickening or pericholecystic fluid.  The ultrasound tech stated that the patient is tender over the gallbladder and therefore early acute cholecystitis cannot be ruled out.  Acute abdominal series is negative.  IMPRESSION: 1. Diffuse abdominal pain of unknown etiology. 2. Cholelithiasis. Exam and lab work inconsistent with acute cholecystitis. 3. G6PD deficiency anemia. 4. Asthma. 5. History of uterine fibroids.These may be playing a role in her pain.  PLAN:  At this time, I have discussed this patient with Dr. Claud Kelp.  Given the fact that her story is not consistent with gallbladder disease and her physical exam reveals diffuse abdominal tenderness, it is doubtful that this is related to simply gallstones. Therefore, we recommend IV fluid hydration.  Would also recommend a CT scan of the abdomen and pelvis to further evaluate the patient to rule out any potential other problems that may be causing more diffuse abdominal pain.  At that time, we will re-evaluate the situation and make a determination whether this patient will need surgical admission or not.     Letha Cape, PA   ______________________________ Angelia Mould. Derrell Lolling, M.D.    KEO/MEDQ  D:  07/14/2010  T:  07/15/2010  Job:  [id number]  cc:   Melvern Banker Fax: [phone]  [facility]  Kennon Rounds, MD  Electronically Signed by  Barnetta Chapel PA on 07/19/2010 02:30:38 PM Electronically Signed by Claud Kelp M.D. on 07/20/2010 10:00:16 AM

## 2010-08-03 ENCOUNTER — Emergency Department (HOSPITAL_COMMUNITY): Payer: Self-pay

## 2010-08-03 ENCOUNTER — Emergency Department (HOSPITAL_COMMUNITY)
Admission: EM | Admit: 2010-08-03 | Discharge: 2010-08-03 | Disposition: A | Discharge status: Home / Self Care | Payer: Self-pay | Attending: Emergency Medicine | Admitting: Emergency Medicine

## 2010-08-03 DIAGNOSIS — M25559 Pain in unspecified hip: Secondary | ICD-10-CM | POA: Insufficient documentation

## 2010-08-03 DIAGNOSIS — M545 Low back pain, unspecified: Secondary | ICD-10-CM | POA: Insufficient documentation

## 2010-08-03 DIAGNOSIS — R109 Unspecified abdominal pain: Secondary | ICD-10-CM | POA: Insufficient documentation

## 2010-08-03 DIAGNOSIS — N83209 Unspecified ovarian cyst, unspecified side: Secondary | ICD-10-CM | POA: Insufficient documentation

## 2010-08-03 DIAGNOSIS — M5430 Sciatica, unspecified side: Secondary | ICD-10-CM | POA: Insufficient documentation

## 2010-08-03 DIAGNOSIS — D551 Anemia due to other disorders of glutathione metabolism: Secondary | ICD-10-CM | POA: Insufficient documentation

## 2010-08-03 DIAGNOSIS — J45909 Unspecified asthma, uncomplicated: Secondary | ICD-10-CM | POA: Insufficient documentation

## 2010-08-03 DIAGNOSIS — D259 Leiomyoma of uterus, unspecified: Secondary | ICD-10-CM | POA: Insufficient documentation

## 2010-08-03 DIAGNOSIS — N390 Urinary tract infection, site not specified: Secondary | ICD-10-CM | POA: Insufficient documentation

## 2010-08-03 DIAGNOSIS — K8020 Calculus of gallbladder without cholecystitis without obstruction: Secondary | ICD-10-CM | POA: Insufficient documentation

## 2010-08-03 LAB — URINALYSIS, ROUTINE W REFLEX MICROSCOPIC
Glucose, UA: NEGATIVE mg/dL
Protein, ur: NEGATIVE mg/dL
pH: 5.5 (ref 5.0–8.0)

## 2010-08-03 LAB — URINE MICROSCOPIC-ADD ON

## 2010-08-08 ENCOUNTER — Inpatient Hospital Stay (HOSPITAL_COMMUNITY)
Admission: EM | Admit: 2010-08-08 | Discharge: 2010-08-10 | DRG: 419 | Disposition: A | Discharge status: Home / Self Care | Payer: Self-pay | Attending: General Surgery | Admitting: General Surgery

## 2010-08-08 ENCOUNTER — Emergency Department (HOSPITAL_COMMUNITY): Payer: Self-pay

## 2010-08-08 DIAGNOSIS — K8000 Calculus of gallbladder with acute cholecystitis without obstruction: Principal | ICD-10-CM | POA: Diagnosis present

## 2010-08-08 DIAGNOSIS — J45909 Unspecified asthma, uncomplicated: Secondary | ICD-10-CM | POA: Diagnosis present

## 2010-08-08 DIAGNOSIS — Z9851 Tubal ligation status: Secondary | ICD-10-CM

## 2010-08-08 DIAGNOSIS — A5900 Urogenital trichomoniasis, unspecified: Secondary | ICD-10-CM | POA: Diagnosis present

## 2010-08-08 LAB — CBC
HCT: 31 % — ABNORMAL LOW (ref 36.0–46.0)
MCH: 29.2 pg (ref 26.0–34.0)
MCV: 90.4 fL (ref 78.0–100.0)
RBC: 3.43 MIL/uL — ABNORMAL LOW (ref 3.87–5.11)
RDW: 20.4 % — ABNORMAL HIGH (ref 11.5–15.5)
WBC: 13.9 10*3/uL — ABNORMAL HIGH (ref 4.0–10.5)

## 2010-08-08 LAB — DIFFERENTIAL
Eosinophils Relative: 1 % (ref 0–5)
Lymphocytes Relative: 21 % (ref 12–46)
Lymphs Abs: 2.9 10*3/uL (ref 0.7–4.0)
Monocytes Relative: 8 % (ref 3–12)

## 2010-08-08 LAB — COMPREHENSIVE METABOLIC PANEL
ALT: 19 U/L (ref 0–35)
Albumin: 4 g/dL (ref 3.5–5.2)
Alkaline Phosphatase: 48 U/L (ref 39–117)
Chloride: 104 mEq/L (ref 96–112)
Potassium: 3.6 mEq/L (ref 3.5–5.1)
Sodium: 138 mEq/L (ref 135–145)
Total Protein: 7.9 g/dL (ref 6.0–8.3)

## 2010-08-08 LAB — URINALYSIS, ROUTINE W REFLEX MICROSCOPIC
Nitrite: NEGATIVE
Protein, ur: NEGATIVE mg/dL
Specific Gravity, Urine: 1.026 (ref 1.005–1.030)
Urobilinogen, UA: 0.2 mg/dL (ref 0.0–1.0)

## 2010-08-08 LAB — URINE MICROSCOPIC-ADD ON

## 2010-08-08 LAB — WET PREP, GENITAL: Clue Cells Wet Prep HPF POC: NONE SEEN

## 2010-08-08 LAB — POCT PREGNANCY, URINE: Preg Test, Ur: NEGATIVE

## 2010-08-09 ENCOUNTER — Other Ambulatory Visit (INDEPENDENT_AMBULATORY_CARE_PROVIDER_SITE_OTHER): Payer: Self-pay | Admitting: General Surgery

## 2010-08-09 LAB — CBC
HCT: 27.1 % — ABNORMAL LOW (ref 36.0–46.0)
MCH: 28.3 pg (ref 26.0–34.0)
MCHC: 31 g/dL (ref 30.0–36.0)
MCV: 91.2 fL (ref 78.0–100.0)
RDW: 19.8 % — ABNORMAL HIGH (ref 11.5–15.5)

## 2010-08-11 ENCOUNTER — Emergency Department (HOSPITAL_COMMUNITY): Payer: Self-pay

## 2010-08-11 ENCOUNTER — Observation Stay (HOSPITAL_COMMUNITY)
Admission: EM | Admit: 2010-08-11 | Discharge: 2010-08-12 | Disposition: A | Discharge status: Other Acute Inpatient Hospital | Payer: Self-pay | Attending: Internal Medicine | Admitting: Internal Medicine

## 2010-08-11 DIAGNOSIS — F411 Generalized anxiety disorder: Secondary | ICD-10-CM | POA: Insufficient documentation

## 2010-08-11 DIAGNOSIS — E876 Hypokalemia: Secondary | ICD-10-CM | POA: Insufficient documentation

## 2010-08-11 DIAGNOSIS — N920 Excessive and frequent menstruation with regular cycle: Secondary | ICD-10-CM | POA: Insufficient documentation

## 2010-08-11 DIAGNOSIS — R0602 Shortness of breath: Secondary | ICD-10-CM | POA: Insufficient documentation

## 2010-08-11 DIAGNOSIS — D72829 Elevated white blood cell count, unspecified: Secondary | ICD-10-CM | POA: Insufficient documentation

## 2010-08-11 DIAGNOSIS — N390 Urinary tract infection, site not specified: Secondary | ICD-10-CM | POA: Insufficient documentation

## 2010-08-11 DIAGNOSIS — F29 Unspecified psychosis not due to a substance or known physiological condition: Principal | ICD-10-CM | POA: Insufficient documentation

## 2010-08-11 DIAGNOSIS — D259 Leiomyoma of uterus, unspecified: Secondary | ICD-10-CM | POA: Insufficient documentation

## 2010-08-11 DIAGNOSIS — R404 Transient alteration of awareness: Secondary | ICD-10-CM | POA: Insufficient documentation

## 2010-08-11 DIAGNOSIS — J45909 Unspecified asthma, uncomplicated: Secondary | ICD-10-CM | POA: Insufficient documentation

## 2010-08-11 DIAGNOSIS — R000 Tachycardia, unspecified: Secondary | ICD-10-CM | POA: Insufficient documentation

## 2010-08-11 DIAGNOSIS — K668 Other specified disorders of peritoneum: Secondary | ICD-10-CM | POA: Insufficient documentation

## 2010-08-11 DIAGNOSIS — D551 Anemia due to other disorders of glutathione metabolism: Secondary | ICD-10-CM | POA: Insufficient documentation

## 2010-08-11 DIAGNOSIS — Z79899 Other long term (current) drug therapy: Secondary | ICD-10-CM | POA: Insufficient documentation

## 2010-08-11 DIAGNOSIS — I517 Cardiomegaly: Secondary | ICD-10-CM | POA: Insufficient documentation

## 2010-08-11 DIAGNOSIS — H5316 Psychophysical visual disturbances: Secondary | ICD-10-CM | POA: Insufficient documentation

## 2010-08-11 DIAGNOSIS — J9819 Other pulmonary collapse: Secondary | ICD-10-CM | POA: Insufficient documentation

## 2010-08-11 DIAGNOSIS — Z9089 Acquired absence of other organs: Secondary | ICD-10-CM | POA: Insufficient documentation

## 2010-08-11 LAB — URINALYSIS, ROUTINE W REFLEX MICROSCOPIC
Glucose, UA: NEGATIVE mg/dL
Specific Gravity, Urine: 1.005 (ref 1.005–1.030)
Urobilinogen, UA: 0.2 mg/dL (ref 0.0–1.0)
pH: 8 (ref 5.0–8.0)

## 2010-08-11 LAB — URINE MICROSCOPIC-ADD ON

## 2010-08-12 ENCOUNTER — Encounter (HOSPITAL_COMMUNITY): Payer: Self-pay

## 2010-08-12 ENCOUNTER — Ambulatory Visit (HOSPITAL_COMMUNITY)
Admission: EM | Admit: 2010-08-12 | Discharge: 2010-08-12 | Disposition: A | Discharge status: Home / Self Care | Payer: Self-pay | Source: Ambulatory Visit | Attending: Emergency Medicine | Admitting: Emergency Medicine

## 2010-08-12 ENCOUNTER — Inpatient Hospital Stay (HOSPITAL_COMMUNITY)
Admission: AD | Admit: 2010-08-12 | Discharge: 2010-08-18 | DRG: 885 | Disposition: A | Discharge status: Home / Self Care | Payer: Self-pay | Source: Ambulatory Visit | Attending: Psychiatry | Admitting: Psychiatry

## 2010-08-12 DIAGNOSIS — IMO0002 Reserved for concepts with insufficient information to code with codable children: Secondary | ICD-10-CM

## 2010-08-12 DIAGNOSIS — N390 Urinary tract infection, site not specified: Secondary | ICD-10-CM

## 2010-08-12 DIAGNOSIS — Z6379 Other stressful life events affecting family and household: Secondary | ICD-10-CM

## 2010-08-12 DIAGNOSIS — F29 Unspecified psychosis not due to a substance or known physiological condition: Principal | ICD-10-CM

## 2010-08-12 DIAGNOSIS — Z882 Allergy status to sulfonamides status: Secondary | ICD-10-CM

## 2010-08-12 DIAGNOSIS — Z880 Allergy status to penicillin: Secondary | ICD-10-CM

## 2010-08-12 DIAGNOSIS — F4310 Post-traumatic stress disorder, unspecified: Secondary | ICD-10-CM

## 2010-08-12 DIAGNOSIS — D539 Nutritional anemia, unspecified: Secondary | ICD-10-CM

## 2010-08-12 DIAGNOSIS — J45909 Unspecified asthma, uncomplicated: Secondary | ICD-10-CM

## 2010-08-12 DIAGNOSIS — R404 Transient alteration of awareness: Secondary | ICD-10-CM

## 2010-08-12 LAB — POCT CARDIAC MARKERS: Myoglobin, poc: 28.3 ng/mL (ref 12–200)

## 2010-08-12 LAB — DIFFERENTIAL
Eosinophils Relative: 2 % (ref 0–5)
Lymphocytes Relative: 20 % (ref 12–46)
Lymphs Abs: 2.4 10*3/uL (ref 0.7–4.0)
Monocytes Absolute: 1 10*3/uL (ref 0.1–1.0)

## 2010-08-12 LAB — CBC
HCT: 30.2 % — ABNORMAL LOW (ref 36.0–46.0)
MCHC: 32.1 g/dL (ref 30.0–36.0)
MCV: 91 fL (ref 78.0–100.0)
RDW: 19 % — ABNORMAL HIGH (ref 11.5–15.5)

## 2010-08-12 LAB — BASIC METABOLIC PANEL
BUN: 7 mg/dL (ref 6–23)
Calcium: 9.2 mg/dL (ref 8.4–10.5)
GFR calc non Af Amer: >60 mL/min (ref >60)
Glucose, Bld: 93 mg/dL (ref 70–99)
Potassium: 3.4 mEq/L — ABNORMAL LOW (ref 3.5–5.1)

## 2010-08-12 MED ORDER — IOHEXOL 300 MG/ML  SOLN
80.0000 mL | Freq: Once | INTRAMUSCULAR | Status: AC | PRN
  Administered 2010-08-12: 80 mL via INTRAVENOUS

## 2010-08-13 ENCOUNTER — Encounter (HOSPITAL_COMMUNITY): Payer: Self-pay

## 2010-08-13 ENCOUNTER — Inpatient Hospital Stay (HOSPITAL_COMMUNITY): Payer: Self-pay

## 2010-08-13 DIAGNOSIS — F29 Unspecified psychosis not due to a substance or known physiological condition: Secondary | ICD-10-CM

## 2010-08-13 LAB — CBC
HCT: 29.1 % — ABNORMAL LOW (ref 36.0–46.0)
Hemoglobin: 9.1 g/dL — ABNORMAL LOW (ref 12.0–15.0)
RBC: 3.19 MIL/uL — ABNORMAL LOW (ref 3.87–5.11)
WBC: 10.1 10*3/uL (ref 4.0–10.5)

## 2010-08-13 LAB — DIFFERENTIAL
Basophils Absolute: 0 10*3/uL (ref 0.0–0.1)
Lymphocytes Relative: 23 % (ref 12–46)
Monocytes Absolute: 0.7 10*3/uL (ref 0.1–1.0)
Neutro Abs: 6.8 10*3/uL (ref 1.7–7.7)
Neutrophils Relative %: 68 % (ref 43–77)

## 2010-08-13 LAB — URINALYSIS, ROUTINE W REFLEX MICROSCOPIC
Bilirubin Urine: NEGATIVE
Hgb urine dipstick: NEGATIVE
Ketones, ur: NEGATIVE mg/dL
Nitrite: NEGATIVE
pH: 6 (ref 5.0–8.0)

## 2010-08-13 LAB — COMPREHENSIVE METABOLIC PANEL
ALT: 322 U/L — ABNORMAL HIGH (ref 0–35)
AST: 87 U/L — ABNORMAL HIGH (ref 0–37)
Alkaline Phosphatase: 105 U/L (ref 39–117)
CO2: 22 mEq/L (ref 19–32)
Chloride: 105 mEq/L (ref 96–112)
GFR calc Af Amer: >60 mL/min (ref >60)
GFR calc non Af Amer: >60 mL/min (ref >60)
Glucose, Bld: 93 mg/dL (ref 70–99)
Potassium: 3.8 mEq/L (ref 3.5–5.1)
Sodium: 136 mEq/L (ref 135–145)

## 2010-08-13 LAB — LIPASE, BLOOD: Lipase: 46 U/L (ref 11–59)

## 2010-08-13 LAB — URINE MICROSCOPIC-ADD ON

## 2010-08-13 LAB — URINE CULTURE

## 2010-08-13 MED ORDER — IOHEXOL 300 MG/ML  SOLN
100.0000 mL | Freq: Once | INTRAMUSCULAR | Status: AC | PRN
  Administered 2010-08-13: 100 mL via INTRAVENOUS

## 2010-08-13 MED ORDER — IOHEXOL 300 MG/ML  SOLN: 100.0000 mL | Freq: Once | INTRAMUSCULAR | Status: AC | PRN

## 2010-08-14 LAB — URINE CULTURE: Colony Count: 10000

## 2010-08-14 NOTE — H&P (Signed)
NAMEIGNACIA, Valerie Lee                 ACCOUNT NO.:  [id number]  MEDICAL RECORD NO.:  [id number]           PATIENT TYPE:  E  LOCATION:  WLED                         FACILITY:  [facility]  PHYSICIAN:  Houston Siren, MD           DATE OF BIRTH:  01/21/1976  DATE OF ADMISSION:  08/11/2010 DATE OF DISCHARGE:                             HISTORY & PHYSICAL   PRIMARY CARE PHYSICIAN:  [facility].  ADVANCE DIRECTIVE:  Full code.  REASON FOR ADMISSION:  Acute delirium.  HISTORY OF PRESENT ILLNESS:  This is a 35 year old female with a history of asthma and G6PD deficiency anemia, status post lap chole a few days ago, brought to the emergency room with altered mental status.  She was exhibiting severe paranoia, anxiety, and delirium.  She was saying "people were coming into my room."  She points to the wall worrying about people coming, and she exhibits extreme paranoia.  Evaluation in the emergency room included a CBC with slight leukocytosis of 12.3 thousand and hemoglobin of 9.7.  Chest x-ray showed no acute cardiopulmonary disease.  Her urinalysis did show 7-10 wbcs and a few bacteria.  She came into the emergency room on Cipro.  She denies any prior psychiatric problem or any event similar to this.  Hospitalist was asked to admit the patient for altered mental status.  PAST MEDICAL HISTORY:  As above.  SOCIAL HISTORY:  She denies any drug use.  Denies alcohol use.  She is married, with 6 children.  Her husband work's at [employer].  FAMILY HISTORY:  Noncontributory.  REVIEW OF SYSTEMS:  Otherwise unremarkable.  She does have slight abdominal pain with her recent surgery.  MEDICATIONS:  Cipro and oxycodone.  PHYSICAL EXAMINATION:  VITAL SIGNS:  Temperature 98.3, blood pressure 110/63, pulse of 74, respiratory rate of 16. GENERAL:  She is alert and oriented and conversing.  She does exhibit paranoia and anxiety.  She was worrying of people coming through the curtain, etc.  She knows where she is  and she is able to recall recent happenings. CARDIAC:  S1 and S2 regular. LUNGS:  Clear. ABDOMEN:  With well healed scar, slightly tender.  Bowel sounds present, nondistended. EXTREMITIES:  Show no edema.  She is able to move all 4 extremities. SKIN:  Warm and dry. NEUROLOGIC:  Nonfocal.  LABORATORY STUDIES:  See above.  IMPRESSION:  This is a 35 year old who has urinary tract infection and recent laparoscopic cholecystectomy on narcotic along with Cipro presented with acute delirium.  It is highly likely that she has an adverse reaction to her narcotics.  Other differential could include depression with psychotic features, or acute psychosis from psychiatric disorders.  I agree to admit her for observation.  If she has severe anxiety, we will use Ativan.  She also has urinary tract infection and Cipro can cause hemolysis in patients with G6PD deficiency, so I will change it to Macrodantin 100 mg b.i.d. for 3 days. Note that she should avoid fava beans in her diet as this can cause hemolysis as well with G6PD deficiency.  She is a full code.  Will be admitted to [facility].  I would like to get urinary pregnancy test and urinary drug screen as well.  She is stable otherwise.     Houston Siren, MD     PL/MEDQ  D:  08/12/2010  T:  08/12/2010  Job:  [id number]  Electronically Signed by Houston Siren  on 08/14/2010 11:08:04 PM
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
## 2010-08-18 ENCOUNTER — Encounter: Payer: Self-pay | Admitting: Family Medicine

## 2010-08-18 ENCOUNTER — Encounter: Payer: Self-pay | Admitting: Obstetrics & Gynecology
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
## 2010-08-26 ENCOUNTER — Encounter (INDEPENDENT_AMBULATORY_CARE_PROVIDER_SITE_OTHER): Payer: Self-pay | Admitting: General Surgery
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
## 2010-09-13 NOTE — Op Note (Signed)
NAMEDEVANNY, REISSIG                 ACCOUNT NO.:  [id number]  MEDICAL RECORD NO.:  [id number]           PATIENT TYPE:  I  LOCATION:  1524                         FACILITY:  [facility]  PHYSICIAN:  Lennie Muckle, MD      DATE OF BIRTH:  07/24/1975  DATE OF PROCEDURE:  08/09/2010 DATE OF DISCHARGE:                              OPERATIVE REPORT   PREOPERATIVE DIAGNOSES:  Acute cholecystitis, history of cholelithiasis.  POSTOPERATIVE DIAGNOSIS:  Acute cholecystitis, history of cholelithiasis.  PROCEDURE:  Laparoscopic cholecystectomy.  SURGEON:  Irelynn Schermerhorn L. Freida Busman, MD  ASSISTANT:  OR staff.  ANESTHESIA:  General endotracheal anesthesia.  ESTIMATED BLOOD LOSS:  Minimal.  SPECIMEN:  Gallbladder.  COMPLICATIONS:  No immediate complications.  DRAINS:  No drains were placed.  INDICATIONS FOR PROCEDURE:  Ms. [name] is a 34-year female who came in with abdominal pain.  She had had intermittent episodes throughout the year.  Due to her persistent pain, it was believed she had had acute cholecystitis.  She was admitted and received IV antibiotics and informed consent was obtained for laparoscopic cholecystectomy.  DETAILS OF PROCEDURE:  Ms. [name] was identified in the preoperative holding area, seen by Anesthesia, taken to the operating room and once in the operating room placed in supine position.  Sequential compression devices were applied to her lower extremity.  Her abdomen was then prepped and draped in usual sterile fashion after the patient was placed under general endotracheal anesthesia.  A surgical time-out was performed.  I began by placing incision in the right upper quadrant using 5 mm camera and the Optiview, placed the camera into the abdominal cavity.  All layers of the abdominal wall were visualized upon entry.  I inspected the abdomen and found no evidence of injury upon placement of the trocar.  I placed an 11 mm trocar at the umbilical region, I could find no adhesions  except for a few omental bands.  I then placed the camera in the umbilical region, placed 2 additional trocars in the right upper quadrant under visualization with the camera.  The gallbladder was identified in normal anatomic position.  There were few adhesions from the liver to the abdominal wall.  I grasped the fundus of the gallbladder, retracted this to the head of the patient, retracted infundibulum away from liver bed.  Using [place] forceps and electrocautery, obtained a critical view of the cystic duct and artery. There was adequate length on the cystic duct.  I was able to visualize the common bile duct to the patient's thin habitus.  No stones were palpated in the cystic duct.  After clipping and dividing the cyst duct and artery, I continued dissecting the gallbladder from the liver bed. There was no bile spillage.  Minimal amount of bleeding.  I placed the gallbladder in the EndoCatch bag and removed it from the umbilical incision.  I then inspected the liver bed.  There was only minimal amount of bleeding from the liver capsule.  I used electrocautery to easily correct this.  Clips were placed on the cystic duct and artery. I did 1 final inspection of  the liver bed, irrigated, and found no bile leakage and no bleeding.  I then removed the gallbladder from the abdomen, closed the fascial defect in the umbilical region using a 0 Vicryl suture.  After closure of the fascial defect, I had 1 final inspection of the abdomen.  No evidence of bleeding, injury or bile leakage.  I then released pneumoperitoneum and removed the trocars.  20 mL of 0.25% Marcaine was used for local block.  Skin was closed with 4-0 Monocryl.  Dermabond was placed as final dressing.  The patient was extubated, transferred to postanesthesia care in stable condition.  She will probably be discharged home tomorrow.     Lennie Muckle, MD     ALA/MEDQ  D:  08/09/2010  T:  08/09/2010  Job:   [id number]  Electronically Signed by Bertram Savin MD on 09/13/2010 12:06:01 PM

## 2010-09-13 NOTE — H&P (Signed)
NAMETEMPE, RAZZAK                 ACCOUNT NO.:  [id number]  MEDICAL RECORD NO.:  [id number]           PATIENT TYPE:  E  LOCATION:  WLED                         FACILITY:  [facility]  PHYSICIAN:  Lennie Muckle, MD      DATE OF BIRTH:  01/24/1976  DATE OF ADMISSION:  08/08/2010 DATE OF DISCHARGE:                             HISTORY & PHYSICAL   CHIEF COMPLAINT:  Abdominal pain.  HISTORY:  [name] is a 35 year old female who describes having onset of abdominal pain 2 days ago.  She had had a previous bout approximately a month ago, was seen in the emergency department, was told she "passed the stone" and was discharged home.  She states she had several episodes of epigastric discomfort with radiation to her abdomen since then.  The most recent episode started 2 days ago.  The pain is located in epigastric region and it does radiate across all of her abdomen.  She feels slightly distended.  She had associated bouts of nausea and vomiting.  She has had no documented fever but has chills.  She states that nothing relieves the pain.  It has been 10 out of 10 at its worst. She states the pain is worse with eating.  She also describes difficulty with swallowing both to solids and liquids.  PAST MEDICAL HISTORY:  Asthma, G6PD iron-deficiency, has had several transfusions.  FAMILY HISTORY:  Negative.  SURGICAL HISTORY:  Tubal ligation.  SOCIAL HISTORY:  She is married.  Does smoke 1 cigarette a day.  No significant alcohol use.  MEDICATIONS:  No current medications.  ALLERGIES:  Include PENICILLIN and BACTRIM, both cause rash and throat swelling.  REVIEW OF SYSTEMS:  Negative other than she feels that she has a urinary tract infection.  PHYSICAL EXAMINATION:  GENERAL:  On physical examination, she is well- developed, well-nourished female, lying in a stretcher, in no acute distress. VITAL SIGNS:  Temperature is 98.2, pulse is 71, blood pressure 99/82. HEENT EXAM:  Head is  normocephalic.  Extraocular muscles are intact. Pupils are equal and round.  Conjunctivae are clear.  Nares are clear without drainage.  Oral mucosa is pink and moist.  Dentition is fair. NECK:  Neck has no tenderness.  Normal range of motion.  Thyroid without abnormalities.  Trachea is midline. LYMPHATIC SYSTEM:  No lymphadenopathy along the neck, submandibular, submental, superficial, cervical, posterior cervical, supraclavicular area. CHEST:  Chest has normal expansion and excursion.  Clear to auscultation bilaterally.  No crepitus. CARDIOVASCULAR EXAM:  Regular rate and rhythm.  No murmurs, gallops or rubs. EXTREMITIES:  No edema in the lower extremities.  Pulses palpated in the upper extremities. ABDOMEN:  Soft, mildly distended.  She has tenderness throughout most of the abdomen.  She has more pronounced in the epigastric region.  No hernias are listed.  No masses are appreciated. SKIN EXAM:  No rashes, no jaundice.  Skin is warm to touch. MUSCULOSKELETAL:  No deformities.  No pain with palpation. PSYCHIATRIC:  Psychological exam is normal.  Affect, she answers questions appropriately.  LABORATORY DATA:  Blood work reveals a mildly elevated white count  of 13, hemoglobin and hematocrit of 10 and 31.  Liver enzymes are all within normal limits.  Serum chemistries are normal.  Ultrasound reveals stones within the gallbladder.  No pericholecystic fluid.  No Murphy sign.  Common bile duct normal at 5.6 mm.  ASSESSMENT AND PLAN:  Acute cholecystitis with cholelithiasis.  Plan to admit with IV antibiotics.  Discussed laparoscopic cholecystectomy with the patient.  I do not think this is going to solve all of her major complaints, however, I do think this will relate the gallbladder issue. I will give her albuterol and Atrovent for possible asthma.  No need for transfusion with her anemia not significant.  Will apply SCDs for DVT prophylaxis.     Lennie Muckle,  MD     ALA/MEDQ  D:  08/08/2010  T:  08/09/2010  Job:  [id number]  Electronically Signed by Bertram Savin MD on 09/13/2010 12:05:57 PM

## 2010-12-07 ENCOUNTER — Emergency Department (HOSPITAL_COMMUNITY)
Admission: EM | Admit: 2010-12-07 | Discharge: 2010-12-08 | Disposition: A | Discharge status: Home / Self Care | Payer: Self-pay | Attending: Emergency Medicine | Admitting: Emergency Medicine

## 2010-12-07 DIAGNOSIS — N898 Other specified noninflammatory disorders of vagina: Secondary | ICD-10-CM | POA: Insufficient documentation

## 2010-12-07 DIAGNOSIS — A64 Unspecified sexually transmitted disease: Secondary | ICD-10-CM | POA: Insufficient documentation

## 2010-12-07 DIAGNOSIS — N739 Female pelvic inflammatory disease, unspecified: Secondary | ICD-10-CM | POA: Insufficient documentation

## 2010-12-07 DIAGNOSIS — A599 Trichomoniasis, unspecified: Secondary | ICD-10-CM | POA: Insufficient documentation

## 2010-12-07 DIAGNOSIS — D259 Leiomyoma of uterus, unspecified: Secondary | ICD-10-CM | POA: Insufficient documentation

## 2010-12-07 LAB — CBC
Hemoglobin: 9.4 g/dL — ABNORMAL LOW (ref 12.0–15.0)
MCHC: 31.3 g/dL (ref 30.0–36.0)
RDW: 16.1 % — ABNORMAL HIGH (ref 11.5–15.5)

## 2010-12-07 LAB — BASIC METABOLIC PANEL
BUN: 7 mg/dL (ref 6–23)
CO2: 25 mEq/L (ref 19–32)
Calcium: 9.7 mg/dL (ref 8.4–10.5)
Creatinine, Ser: 0.63 mg/dL (ref 0.50–1.10)
Glucose, Bld: 82 mg/dL (ref 70–99)

## 2010-12-07 LAB — URINE MICROSCOPIC-ADD ON

## 2010-12-07 LAB — URINALYSIS, ROUTINE W REFLEX MICROSCOPIC
Glucose, UA: NEGATIVE mg/dL
pH: 6 (ref 5.0–8.0)

## 2010-12-07 LAB — WET PREP, GENITAL: Yeast Wet Prep HPF POC: NONE SEEN

## 2010-12-07 LAB — POCT PREGNANCY, URINE: Preg Test, Ur: NEGATIVE

## 2010-12-08 ENCOUNTER — Emergency Department (HOSPITAL_COMMUNITY): Payer: Self-pay

## 2010-12-08 LAB — DIFFERENTIAL
Basophils Relative: 1 % (ref 0–1)
Lymphocytes Relative: 24 % (ref 12–46)
Monocytes Absolute: 1.1 10*3/uL — ABNORMAL HIGH (ref 0.1–1.0)
Monocytes Relative: 9 % (ref 3–12)

## 2011-01-14 ENCOUNTER — Inpatient Hospital Stay (HOSPITAL_COMMUNITY)
Admission: RE | Admit: 2011-01-14 | Discharge: 2011-01-14 | Disposition: A | Discharge status: Home / Self Care | Payer: Self-pay | Source: Ambulatory Visit | Attending: Emergency Medicine | Admitting: Emergency Medicine

## 2011-01-16 ENCOUNTER — Emergency Department (HOSPITAL_COMMUNITY)
Admission: EM | Admit: 2011-01-16 | Discharge: 2011-01-16 | Discharge status: Against Medical Advice | Payer: Self-pay | Attending: Emergency Medicine | Admitting: Emergency Medicine

## 2011-01-16 ENCOUNTER — Emergency Department (HOSPITAL_COMMUNITY): Payer: Self-pay

## 2011-01-16 DIAGNOSIS — R05 Cough: Secondary | ICD-10-CM | POA: Insufficient documentation

## 2011-01-16 DIAGNOSIS — R51 Headache: Secondary | ICD-10-CM | POA: Insufficient documentation

## 2011-01-16 DIAGNOSIS — H9209 Otalgia, unspecified ear: Secondary | ICD-10-CM | POA: Insufficient documentation

## 2011-01-16 DIAGNOSIS — R059 Cough, unspecified: Secondary | ICD-10-CM | POA: Insufficient documentation

## 2011-01-18 ENCOUNTER — Emergency Department (HOSPITAL_COMMUNITY): Payer: Self-pay

## 2011-01-18 ENCOUNTER — Emergency Department (HOSPITAL_COMMUNITY)
Admission: EM | Admit: 2011-01-18 | Discharge: 2011-01-18 | Disposition: A | Discharge status: Home / Self Care | Payer: Self-pay | Attending: Emergency Medicine | Admitting: Emergency Medicine

## 2011-01-18 DIAGNOSIS — D259 Leiomyoma of uterus, unspecified: Secondary | ICD-10-CM | POA: Insufficient documentation

## 2011-01-18 DIAGNOSIS — J45909 Unspecified asthma, uncomplicated: Secondary | ICD-10-CM | POA: Insufficient documentation

## 2011-01-18 DIAGNOSIS — H6690 Otitis media, unspecified, unspecified ear: Secondary | ICD-10-CM | POA: Insufficient documentation

## 2011-01-18 DIAGNOSIS — K047 Periapical abscess without sinus: Secondary | ICD-10-CM | POA: Insufficient documentation

## 2011-01-18 DIAGNOSIS — Z8659 Personal history of other mental and behavioral disorders: Secondary | ICD-10-CM | POA: Insufficient documentation

## 2011-01-18 DIAGNOSIS — Z79899 Other long term (current) drug therapy: Secondary | ICD-10-CM | POA: Insufficient documentation

## 2011-01-18 DIAGNOSIS — D539 Nutritional anemia, unspecified: Secondary | ICD-10-CM | POA: Insufficient documentation

## 2011-01-18 LAB — POCT I-STAT, CHEM 8
BUN: 7 mg/dL (ref 6–23)
Calcium, Ion: 1.13 mmol/L (ref 1.12–1.32)
Creatinine, Ser: 0.9 mg/dL (ref 0.50–1.10)
Glucose, Bld: 82 mg/dL (ref 70–99)
TCO2: 24 mmol/L (ref 0–100)

## 2011-04-11 ENCOUNTER — Observation Stay (HOSPITAL_COMMUNITY)
Admission: AD | Admit: 2011-04-11 | Discharge: 2011-04-13 | Disposition: A | Discharge status: Home / Self Care | Payer: Medicaid Other | Source: Ambulatory Visit | Attending: Obstetrics & Gynecology | Admitting: Obstetrics & Gynecology

## 2011-04-11 ENCOUNTER — Emergency Department (HOSPITAL_COMMUNITY): Payer: Medicaid Other

## 2011-04-11 ENCOUNTER — Encounter (HOSPITAL_COMMUNITY): Payer: Self-pay | Admitting: *Deleted

## 2011-04-11 DIAGNOSIS — N946 Dysmenorrhea, unspecified: Secondary | ICD-10-CM | POA: Insufficient documentation

## 2011-04-11 DIAGNOSIS — D649 Anemia, unspecified: Secondary | ICD-10-CM | POA: Insufficient documentation

## 2011-04-11 DIAGNOSIS — D219 Benign neoplasm of connective and other soft tissue, unspecified: Secondary | ICD-10-CM

## 2011-04-11 DIAGNOSIS — D259 Leiomyoma of uterus, unspecified: Secondary | ICD-10-CM | POA: Insufficient documentation

## 2011-04-11 DIAGNOSIS — N920 Excessive and frequent menstruation with regular cycle: Principal | ICD-10-CM | POA: Insufficient documentation

## 2011-04-11 HISTORY — DX: Von Gierke disease: E74.01

## 2011-04-11 LAB — URINALYSIS, ROUTINE W REFLEX MICROSCOPIC
Bilirubin Urine: NEGATIVE
Glucose, UA: NEGATIVE mg/dL
Hgb urine dipstick: NEGATIVE
Nitrite: NEGATIVE
Protein, ur: NEGATIVE mg/dL
Specific Gravity, Urine: 1.03 (ref 1.005–1.030)
Urobilinogen, UA: 0.2 mg/dL (ref 0.0–1.0)
pH: 5.5 (ref 5.0–8.0)

## 2011-04-11 LAB — TYPE AND SCREEN
ABO/RH(D): A POS
Antibody Screen: NEGATIVE
Unit division: 0
Unit division: 0

## 2011-04-11 LAB — PREPARE RBC (CROSSMATCH)

## 2011-04-11 LAB — APTT: aPTT: 35 seconds (ref 24–37)

## 2011-04-11 LAB — BASIC METABOLIC PANEL
BUN: 9 mg/dL (ref 6–23)
CO2: 23 mEq/L (ref 19–32)
Calcium: 9 mg/dL (ref 8.4–10.5)
Chloride: 108 mEq/L (ref 96–112)
Creatinine, Ser: 0.76 mg/dL (ref 0.50–1.10)
GFR calc Af Amer: >90 mL/min (ref >90)
GFR calc non Af Amer: >90 mL/min (ref >90)
Glucose, Bld: 118 mg/dL — ABNORMAL HIGH (ref 70–99)
Potassium: 3.6 mEq/L (ref 3.5–5.1)
Sodium: 139 mEq/L (ref 135–145)

## 2011-04-11 LAB — CBC
HCT: 21 % — ABNORMAL LOW (ref 36.0–46.0)
Hemoglobin: 5.9 g/dL — CL (ref 12.0–15.0)
MCH: 18.6 pg — ABNORMAL LOW (ref 26.0–34.0)
MCHC: 28.1 g/dL — ABNORMAL LOW (ref 30.0–36.0)
MCV: 66 fL — ABNORMAL LOW (ref 78.0–100.0)
Platelets: 214 10*3/uL (ref 150–400)
RBC: 3.18 MIL/uL — ABNORMAL LOW (ref 3.87–5.11)
RDW: 20.6 % — ABNORMAL HIGH (ref 11.5–15.5)
WBC: 12.5 10*3/uL — ABNORMAL HIGH (ref 4.0–10.5)

## 2011-04-11 LAB — PREGNANCY, URINE: Preg Test, Ur: NEGATIVE

## 2011-04-11 LAB — PROTIME-INR
INR: 1.09 (ref 0.00–1.49)
Prothrombin Time: 14.3 seconds (ref 11.6–15.2)

## 2011-04-11 LAB — URINE MICROSCOPIC-ADD ON

## 2011-04-11 MED ORDER — ONDANSETRON HCL 4 MG/2ML IJ SOLN
4.0000 mg | Freq: Once | INTRAMUSCULAR | Status: AC
  Administered 2011-04-11: 4 mg via INTRAVENOUS
  Filled 2011-04-11: qty 2

## 2011-04-11 MED ORDER — SODIUM CHLORIDE 0.9 % IV BOLUS (SEPSIS)
1000.0000 mL | Freq: Once | INTRAVENOUS | Status: AC
  Administered 2011-04-11: 1000 mL via INTRAVENOUS

## 2011-04-11 MED ORDER — HYDROMORPHONE HCL PF 1 MG/ML IJ SOLN
1.0000 mg | Freq: Once | INTRAMUSCULAR | Status: AC
  Administered 2011-04-11: 1 mg via INTRAVENOUS
  Filled 2011-04-11: qty 1

## 2011-04-11 NOTE — ED Notes (Signed)
Pt in c/o generalized body aches and n/v/d since yesterday

## 2011-04-11 NOTE — ED Notes (Signed)
Pt with abdominal pain that increases with palpation and generalized body aches since yesterday.  Also chills.  Pt states that she has had n/v with this and LBM was 2 days ago.  Pt states that she has had decreased urine output (no pain or burning with urination)

## 2011-04-12 ENCOUNTER — Encounter (HOSPITAL_COMMUNITY): Payer: Self-pay | Admitting: *Deleted

## 2011-04-12 DIAGNOSIS — N946 Dysmenorrhea, unspecified: Secondary | ICD-10-CM

## 2011-04-12 DIAGNOSIS — D259 Leiomyoma of uterus, unspecified: Secondary | ICD-10-CM

## 2011-04-12 DIAGNOSIS — D649 Anemia, unspecified: Secondary | ICD-10-CM

## 2011-04-12 DIAGNOSIS — N920 Excessive and frequent menstruation with regular cycle: Secondary | ICD-10-CM

## 2011-04-12 LAB — TYPE AND SCREEN
ABO/RH(D): A POS
Unit division: 0

## 2011-04-12 LAB — CBC
Hemoglobin: 11.8 g/dL — ABNORMAL LOW (ref 12.0–15.0)
MCHC: 31.7 g/dL (ref 30.0–36.0)
RDW: 20.7 % — ABNORMAL HIGH (ref 11.5–15.5)
WBC: 10.1 10*3/uL (ref 4.0–10.5)

## 2011-04-12 LAB — MRSA PCR SCREENING: MRSA by PCR: NEGATIVE

## 2011-04-12 LAB — ABO/RH: ABO/RH(D): A POS

## 2011-04-12 MED ORDER — ACETAMINOPHEN 500 MG PO TABS
1000.0000 mg | ORAL_TABLET | Freq: Once | ORAL | Status: AC
  Administered 2011-04-12: 1000 mg via ORAL
  Filled 2011-04-12: qty 2

## 2011-04-12 MED ORDER — PRENATAL MULTIVITAMIN CH
1.0000 | ORAL_TABLET | Freq: Every day | ORAL | Status: DC
  Administered 2011-04-12 – 2011-04-13 (×2): 1 via ORAL
  Filled 2011-04-12 (×2): qty 1

## 2011-04-12 MED ORDER — ONDANSETRON HCL 4 MG/2ML IJ SOLN
4.0000 mg | Freq: Four times a day (QID) | INTRAMUSCULAR | Status: DC | PRN
  Administered 2011-04-12: 4 mg via INTRAVENOUS
  Filled 2011-04-12: qty 2

## 2011-04-12 MED ORDER — ONDANSETRON HCL 4 MG PO TABS: 4.0000 mg | ORAL_TABLET | Freq: Four times a day (QID) | ORAL | Status: DC | PRN

## 2011-04-12 MED ORDER — LACTATED RINGERS IV SOLN
INTRAVENOUS | Status: DC
  Administered 2011-04-12 – 2011-04-13 (×3): via INTRAVENOUS

## 2011-04-12 MED ORDER — DOCUSATE SODIUM 100 MG PO CAPS
100.0000 mg | ORAL_CAPSULE | Freq: Two times a day (BID) | ORAL | Status: DC
  Administered 2011-04-12 – 2011-04-13 (×3): 100 mg via ORAL
  Filled 2011-04-12 (×3): qty 1

## 2011-04-12 MED ORDER — TRAMADOL HCL 50 MG PO TABS
50.0000 mg | ORAL_TABLET | Freq: Four times a day (QID) | ORAL | Status: DC | PRN
  Administered 2011-04-12 (×2): 50 mg via ORAL
  Filled 2011-04-12 (×2): qty 1

## 2011-04-12 MED ORDER — HYDROMORPHONE HCL PF 1 MG/ML IJ SOLN
1.0000 mg | INTRAMUSCULAR | Status: DC | PRN
  Administered 2011-04-12 – 2011-04-13 (×5): 1 mg via INTRAVENOUS
  Filled 2011-04-12 (×6): qty 1

## 2011-04-12 MED ORDER — IOHEXOL 300 MG/ML  SOLN
100.0000 mL | Freq: Once | INTRAMUSCULAR | Status: AC | PRN
  Administered 2011-04-12: 100 mL via INTRAVENOUS

## 2011-04-12 MED ORDER — DIPHENHYDRAMINE HCL 25 MG PO CAPS
25.0000 mg | ORAL_CAPSULE | Freq: Once | ORAL | Status: AC
  Administered 2011-04-12: 25 mg via ORAL
  Filled 2011-04-12: qty 1

## 2011-04-12 MED ORDER — HYDROMORPHONE HCL PF 1 MG/ML IJ SOLN
1.0000 mg | Freq: Once | INTRAMUSCULAR | Status: AC
  Administered 2011-04-12: 1 mg via INTRAVENOUS
  Filled 2011-04-12: qty 1

## 2011-04-12 NOTE — Progress Notes (Signed)
   CARE MANAGEMENT NOTE 04/12/2011  Patient:  Valerie Lee, Valerie Lee   Account Number:  [id number]  Date Initiated:  04/12/2011  Documentation initiated by:  Roseanne Reno  Subjective/Objective Assessment:   Anemia, fibroid uterus.     Action/Plan:   Medication assistance per MD consult.   Anticipated DC Date:  04/12/2011   Anticipated DC Plan:  HOME/SELF CARE      DC Planning Services  Medication Assistance      Choice offered to / List presented to:             Status of service:  Completed, signed off  Discharge Disposition:  HOME/SELF CARE  Comments:  04/12/11  1200p  Noted CM consult for medication assistance. Spoke w/ pt regarding medications and pt stated that she was able to afford her medications and did not need any assistance obtaining them.  Noted that pt does have Medicaid at present.  Pt did say that transportation was an issue for her.  CM noted that a SW consult had been made. CM spoke w/ SW to confirm that consult had been obtained and that SW would follow up regarding transportation issue. Pt's nurse aware of CM consult and that no assistance needed at this time from CM and that SW would be following up regarding transportation.  CM available to assist as needed.  TJohnson, RNBSN  [phone]

## 2011-04-12 NOTE — ED Notes (Signed)
[facility] is at the bedside for transport to [facility].  IV WNL.  Pt is pain free

## 2011-04-12 NOTE — ED Notes (Signed)
2nd unit of PRBC verified with 2nd RN using 4 identifiers.  VSS.  Unit # [id number] Apositive, expiration 04/27/2011 (as previous bag was). VS documented on white sheet

## 2011-04-12 NOTE — ED Notes (Signed)
Pt tolerated the first 15 minutes of the PRBC well.  Increased rate to 224ml/h.  Will continue to monitor

## 2011-04-12 NOTE — ED Notes (Signed)
Blood complete, no reaction.  Pt tolerated this well.  Flushing with NS at this time.

## 2011-04-12 NOTE — H&P (Signed)
Valerie Lee is an 36 y.o. married black female who presents from [facility] ED for further treatment of severe anemia and fibroid pain.  Pt has received 2 units of PRBC's thus far, and to further receive 2 more units.  She was resting on side on arrival to her room.  Placed in AICU as overflow, secondary to 3rd floor acuity.  Accompanied by her eldest daughter who was asleep at bedside at time of assessment.  Pt report lower abdominal pain currently 7/10, and last had "Dilaudid" before transfer to [facility].  She reports monthly menses.  Denies current vaginal bleeding.  Reports only surgeries in past are BTL and gall bladder removal.  When asked about allergies, reports codeine causes "itching" and ibuprofen causes her "blood levels to drop, and I have to get a blood transfusion if I take it."  Medical hx remarkable for fibroid uterus, schizophrenia, and G6PD deficiency.  Has had recent constipation, and last BM was small yesterday.  No UTI s/s, no resp c/o's, or fever to report.  No N/V/D.    Complete H&P and extended PE by ED physician at [facility].    No LMP recorded.    Past Medical History  Diagnosis Date  . Asthma   . G6PD (glucose 6 phosphatase deficiency)     Past Surgical History  Procedure Date  . Cholecystectomy     History reviewed. No pertinent family history.  Social History:  reports that she has been smoking Cigarettes.  She has been smoking about .2 packs per day. She does not have any smokeless tobacco history on file. Her alcohol and drug histories not on file.  Allergies:  Allergies  Allergen Reactions  . Bactrim Anaphylaxis, Itching and Swelling  . Codeine Anaphylaxis, Itching and Swelling  . Penicillins Anaphylaxis, Itching and Swelling  . Ibuprofen Other (See Comments)    Lowers hemoglobin    Prescriptions prior to admission  Medication Sig Dispense Refill  . albuterol (PROVENTIL HFA;VENTOLIN HFA) 108 (90 BASE) MCG/ACT inhaler Inhale 2 puffs into the lungs  every 6 (six) hours as needed.      . diphenhydrAMINE (BENADRYL) 25 MG tablet Take 25 mg by mouth every 6 (six) hours as needed. For allergies, itching, and sleep      . docusate sodium (COLACE) 100 MG capsule Take 100 mg by mouth 2 (two) times daily.      . ferrous sulfate 325 (65 FE) MG tablet Take 325 mg by mouth daily with breakfast.      . lurasidone (LATUDA) 80 MG TABS Take 80 mg by mouth daily with breakfast.      . RisperiDONE (RISPERDAL PO) Take 1 tablet by mouth daily after breakfast.        ROS--see HPI  Blood pressure 90/45, pulse 91, temperature 97.9 F (36.6 C), temperature source Oral, resp. rate 18, weight 58.06 kg (128 lb), SpO2 100.00%. Physical Exam  Constitutional: She is oriented to person, place, and time. She appears well-developed. No distress.  Cardiovascular: Normal rate.   Respiratory: Effort normal.  GI: Soft. She exhibits no mass. There is tenderness. There is no rebound and no guarding.       Uterus enlarged and fundus u/-2-3  Musculoskeletal: She exhibits no edema.  Neurological: She is alert and oriented to person, place, and time.  Skin: Skin is warm and dry.    Results for orders placed during the hospital encounter of 04/11/11 (from the past 24 hour(s))  CBC     Status:  Abnormal   Collection Time   04/11/11  9:48 PM      Component Value Range   WBC 12.5 (*) 4.0 - 10.5 (K/uL)   RBC 3.18 (*) 3.87 - 5.11 (MIL/uL)   Hemoglobin 5.9 (*) 12.0 - 15.0 (g/dL)   HCT 16.1 (*) 09.6 - 46.0 (%)   MCV 66.0 (*) 78.0 - 100.0 (fL)   MCH 18.6 (*) 26.0 - 34.0 (pg)   MCHC 28.1 (*) 30.0 - 36.0 (g/dL)   RDW 04.5 (*) 40.9 - 15.5 (%)   Platelets 214  150 - 400 (K/uL)  BASIC METABOLIC PANEL     Status: Abnormal   Collection Time   04/11/11  9:48 PM      Component Value Range   Sodium 139  135 - 145 (mEq/L)   Potassium 3.6  3.5 - 5.1 (mEq/L)   Chloride 108  96 - 112 (mEq/L)   CO2 23  19 - 32 (mEq/L)   Glucose, Bld 118 (*) 70 - 99 (mg/dL)   BUN 9  6 - 23 (mg/dL)    Creatinine, Ser 8.11  0.50 - 1.10 (mg/dL)   Calcium 9.0  8.4 - 91.4 (mg/dL)   GFR calc non Af Amer >90  >90 (mL/min)   GFR calc Af Amer >90  >90 (mL/min)  PROTIME-INR     Status: Normal   Collection Time   04/11/11  9:48 PM      Component Value Range   Prothrombin Time 14.3  11.6 - 15.2 (seconds)   INR 1.09  0.00 - 1.49   APTT     Status: Normal   Collection Time   04/11/11  9:48 PM      Component Value Range   aPTT 35  24 - 37 (seconds)  URINALYSIS, ROUTINE W REFLEX MICROSCOPIC     Status: Abnormal   Collection Time   04/11/11  9:51 PM      Component Value Range   Color, Urine YELLOW  YELLOW    APPearance CLOUDY (*) CLEAR    Specific Gravity, Urine 1.030  1.005 - 1.030    pH 5.5  5.0 - 8.0    Glucose, UA NEGATIVE  NEGATIVE (mg/dL)   Hgb urine dipstick NEGATIVE  NEGATIVE    Bilirubin Urine NEGATIVE  NEGATIVE    Ketones, ur TRACE (*) NEGATIVE (mg/dL)   Protein, ur NEGATIVE  NEGATIVE (mg/dL)   Urobilinogen, UA 0.2  0.0 - 1.0 (mg/dL)   Nitrite NEGATIVE  NEGATIVE    Leukocytes, UA SMALL (*) NEGATIVE   PREGNANCY, URINE     Status: Normal   Collection Time   04/11/11  9:51 PM      Component Value Range   Preg Test, Ur NEGATIVE    URINE MICROSCOPIC-ADD ON     Status: Abnormal   Collection Time   04/11/11  9:51 PM      Component Value Range   Squamous Epithelial / LPF FEW (*) RARE    WBC, UA 3-6  <3 (WBC/hpf)   Bacteria, UA FEW (*) RARE    Urine-Other MUCOUS PRESENT    TYPE AND SCREEN     Status: Normal (Preliminary result)   Collection Time   04/11/11 10:46 PM      Component Value Range   ABO/RH(D) A POS     Antibody Screen NEG     Sample Expiration 04/14/2011     Unit Number [id number]     Blood Component Type RED CELLS,LR     Unit  division 00     Status of Unit ISSUED     Transfusion Status OK TO TRANSFUSE     Crossmatch Result Compatible     Unit Number [id number]     Blood Component Type RED CELLS,LR     Unit division 00     Status of Unit ISSUED     Transfusion  Status OK TO TRANSFUSE     Crossmatch Result Compatible    PREPARE RBC (CROSSMATCH)     Status: Normal   Collection Time   04/11/11 11:00 PM      Component Value Range   Order Confirmation ORDER PROCESSED BY BLOOD BANK    MRSA PCR SCREENING     Status: Normal   Collection Time   04/12/11  3:10 AM      Component Value Range   MRSA by PCR NEGATIVE  NEGATIVE   PREPARE RBC (CROSSMATCH)     Status: Normal   Collection Time   04/12/11  4:55 AM      Component Value Range   Order Confirmation ORDER PROCESSED BY BLOOD BANK    TYPE AND SCREEN     Status: Normal (Preliminary result)   Collection Time   04/12/11  5:10 AM      Component Value Range   ABO/RH(D) A POS     Antibody Screen NEG     Sample Expiration 04/15/2011     Unit Number [id number]     Blood Component Type RED CELLS,LR     Unit division 00     Status of Unit ISSUED     Transfusion Status OK TO TRANSFUSE     Crossmatch Result Compatible     Unit Number [id number]     Blood Component Type RED CELLS,LR     Unit division 00     Status of Unit ALLOCATED     Transfusion Status OK TO TRANSFUSE     Crossmatch Result Compatible    ABO/RH     Status: Normal   Collection Time   04/12/11  5:10 AM      Component Value Range   ABO/RH(D) A POS      Ct Abdomen Pelvis W Contrast  04/12/2011  *RADIOLOGY REPORT*  Clinical Data: Right lower quadrant pain, tenderness, history of uterine fibroids and prior cholecystectomy.  CT ABDOMEN AND PELVIS WITH CONTRAST  Technique:  Multidetector CT imaging of the abdomen and pelvis was performed following the standard protocol during bolus administration of intravenous contrast.  Contrast: OMNIPAQUE IOHEXOL 300 MG/ML IV SOLN  Comparison: 08/13/2010  Findings: Lung bases clear.  Normal heart size.  No pericardial or pleural effusion.  Abdomen:  Previous cholecystectomy evident.  Liver, biliary system, pancreas, spleen, both kidneys, adrenal glands are within normal limits and demonstrate no acute  process.  No abdominal free fluid, fluid collection, hemorrhage, hematoma, abscess, adenopathy.  No bowel obstruction pattern, dilatation, ileus, or free air.  Pelvis:  Heterogeneous diffuse numerous uterine masses some of which have central necrosis compatible with numerous uterine fibroids. Slight interval enlargement of the multi fibroid uterus. Small amount of physiologic pelvic free fluid dependently.  Stable prominence of the left ovary in the left lower quadrant roughly measuring 4.4 x 2.1 cm. Right ovary not well appreciated.  No pelvic acute inflammation and nine, distal bowel process, adenopathy, hemorrhage, hematoma, inguinal abnormality, or hernia.  No acute abnormal osseous finding.  IMPRESSION: Previous cholecystectomy.  Continued slight interval enlargement of the multi fibroid uterus.  No acute intra-abdominal or  pelvic process demonstrated.  Original Report Authenticated By: Judie Petit. Ruel Favors, M.D.    Assessment/Plan: 1.  36 y.o. MBF w/ severe symptomatic anemia and fibroid pain 2. S/p BTL in past 3.  S/p 2 units PRBC's at [facility] 4.  Schizophrenia/asthma/G6PD def/several medication allergies 5.  Recent constipation  1.  Admit for 23 hr observation w/ Dr. Estanislado Pandy as attending 2.  Rout gyn orders/ pain management 3.  Secondary to allergies, will try po Ultram, and if breakthrough pain, will give prn IV Dilaudid (used at [facility]) 4. Per dr. Estanislado Pandy, transfuse 2 more units PRBC (4 total) and pt to receive H/H 4hr after 4th unit infused 5.  Tylenol 1gm and Benadryl 25 mg po prior to start of 3rd unit 6.  MD to follow  Frazer [place] H 04/12/2011, 6:41 AM

## 2011-04-12 NOTE — ED Provider Notes (Signed)
History    35yF with multiple complaints. General malaise since yesterday. Diffuse lower abdominal pain. Does not lateralize. Constant and worse with movement. No fever. Chills. No urinary complaints. Nausea and vomiting yesterday and today. Pt specifically asked about diarrhea which mentioned in triage note and denied to me. No sick contacts. No CP or SOB.  CSN: [id number]  Arrival date & time 04/11/11  1906   None     Chief Complaint  Patient presents with  . Influenza    (Consider location/radiation/quality/duration/timing/severity/associated sxs/prior treatment) HPI  Past Medical History  Diagnosis Date  . Asthma   . G6PD (glucose 6 phosphatase deficiency)     Past Surgical History  Procedure Date  . Cholecystectomy     History reviewed. No pertinent family history.  History  Substance Use Topics  . Smoking status: Not on file  . Smokeless tobacco: Not on file  . Alcohol Use:     OB History    Grav Para Term Preterm Abortions TAB SAB Ect Mult Living                  Review of Systems   Review of symptoms negative unless otherwise noted in HPI.   Allergies  Codeine; Penicillins; Sulfa antibiotics; Sulfamethoxazole; and Trimethoprim  Home Medications   Current Outpatient Rx  Name Route Sig Dispense Refill  . ALBUTEROL SULFATE HFA 108 (90 BASE) MCG/ACT IN AERS Inhalation Inhale 2 puffs into the lungs every 6 (six) hours as needed.    Marland Kitchen DOCUSATE SODIUM 100 MG PO CAPS Oral Take 100 mg by mouth 2 (two) times daily.    Marland Kitchen FERROUS SULFATE 325 (65 FE) MG PO TABS Oral Take 325 mg by mouth daily with breakfast.    . LURASIDONE HCL 80 MG PO TABS Oral Take 80 mg by mouth daily with breakfast.    . DIPHENHYDRAMINE CITRATE PO Oral Take by mouth.        BP 112/52  Pulse 79  Temp(Src) 99.4 F (37.4 C) (Oral)  Resp 16  SpO2 100%  Physical Exam  Nursing note and vitals reviewed. Constitutional: She is oriented to person, place, and time. She appears  well-developed and well-nourished. No distress.       Laying in bed. Mildly uncomfortable appearing, but not toxic.  HENT:  Head: Normocephalic and atraumatic.  Right Ear: External ear normal.  Left Ear: External ear normal.  Nose: Nose normal.  Mouth/Throat: Oropharynx is clear and moist.  Eyes: Conjunctivae are normal. Pupils are equal, round, and reactive to light. Right eye exhibits no discharge. Left eye exhibits no discharge.  Neck: Neck supple.  Cardiovascular: Normal rate, regular rhythm and normal heart sounds.  Exam reveals no gallop and no friction rub.   No murmur heard. Pulmonary/Chest: Effort normal and breath sounds normal. No respiratory distress.  Abdominal: Soft. She exhibits no distension and no mass. There is tenderness. There is rebound and guarding.       Abdomen exquisitely tender even to relatively superificial palpation. Lower abdomen worse. Pt would not allow deeper palpation and pushes hands away. No rigidity. Does not appear to be rebound tenderness but pt not very tolerant of exam.  Genitourinary:       B/l cva tenderness  Musculoskeletal: She exhibits no edema and no tenderness.  Neurological: She is alert and oriented to person, place, and time.  Skin: Skin is warm and dry.  Psychiatric: She has a normal mood and affect. Her behavior is normal. Thought content normal.  ED Course  Procedures (including critical care time)  Labs Reviewed  CBC - Abnormal; Notable for the following:    WBC 12.5 (*)    RBC 3.18 (*)    Hemoglobin 5.9 (*)    HCT 21.0 (*)    MCV 66.0 (*)    MCH 18.6 (*)    MCHC 28.1 (*)    RDW 20.6 (*)    All other components within normal limits  BASIC METABOLIC PANEL - Abnormal; Notable for the following:    Glucose, Bld 118 (*)    All other components within normal limits  URINALYSIS, ROUTINE W REFLEX MICROSCOPIC - Abnormal; Notable for the following:    APPearance CLOUDY (*)    Ketones, ur TRACE (*)    Leukocytes, UA SMALL (*)      All other components within normal limits  URINE MICROSCOPIC-ADD ON - Abnormal; Notable for the following:    Squamous Epithelial / LPF FEW (*)    Bacteria, UA FEW (*)    All other components within normal limits  PREGNANCY, URINE  TYPE AND SCREEN  PREPARE RBC (CROSSMATCH)  PROTIME-INR  APTT  POCT OCCULT BLOOD STOOL, DEVICE   Ct Abdomen Pelvis W Contrast  04/12/2011  *RADIOLOGY REPORT*  Clinical Data: Right lower quadrant pain, tenderness, history of uterine fibroids and prior cholecystectomy.  CT ABDOMEN AND PELVIS WITH CONTRAST  Technique:  Multidetector CT imaging of the abdomen and pelvis was performed following the standard protocol during bolus administration of intravenous contrast.  Contrast: OMNIPAQUE IOHEXOL 300 MG/ML IV SOLN  Comparison: 08/13/2010  Findings: Lung bases clear.  Normal heart size.  No pericardial or pleural effusion.  Abdomen:  Previous cholecystectomy evident.  Liver, biliary system, pancreas, spleen, both kidneys, adrenal glands are within normal limits and demonstrate no acute process.  No abdominal free fluid, fluid collection, hemorrhage, hematoma, abscess, adenopathy.  No bowel obstruction pattern, dilatation, ileus, or free air.  Pelvis:  Heterogeneous diffuse numerous uterine masses some of which have central necrosis compatible with numerous uterine fibroids. Slight interval enlargement of the multi fibroid uterus. Small amount of physiologic pelvic free fluid dependently.  Stable prominence of the left ovary in the left lower quadrant roughly measuring 4.4 x 2.1 cm. Right ovary not well appreciated.  No pelvic acute inflammation and nine, distal bowel process, adenopathy, hemorrhage, hematoma, inguinal abnormality, or hernia.  No acute abnormal osseous finding.  IMPRESSION: Previous cholecystectomy.  Continued slight interval enlargement of the multi fibroid uterus.  No acute intra-abdominal or pelvic process demonstrated.  Original Report Authenticated By:  Judie Petit. Ruel Favors, M.D.     1:24 AM Pt again asked about previous gynecologic care. Pt states that last seen 11 years ago in [place], [place] with the birth of her son. Pt with numerous ED evaluations and imaging ordered by ER physicians. Pt states that she has followed up with [facility] for various things but doesn't think has ever seen a gynecologist.  1. Fibroids   2. Anemia       MDM  35yF with abdominal pain and anemia. CT with evidence of multiple large fibroids, some of which appear necrotic. Given most likely source of anemia, feel that most appropriate to be evaluated by gynecology. Drop of hemoglobin of 5g from last value, but baseline appears to be around 9. Transfusion initiated. Discussed with gynecology. Will transfer to [facility] for further evaluation.        Raeford Razor, MD 04/12/11 [phone]

## 2011-04-12 NOTE — Progress Notes (Signed)
S: Still struggling with a lot of pain despite multiple doses of Dilaudid. Completed 4 units of PRC at noon. Denies vaginal bleeding.      Reports a regular menstrual cycle but lasting 7-28 days, having to change Pampers every 60 minutes passing clots as large as 5 cm. Associated dysmenorrhea up to 10/10.  O: Alert, cooperative      Normal VS      Uterus exquisitely tender to touch ( pt starts sobbing)      Ext: negative  A: Large symptomatic uterine fibroids with severe pelvic pain due to degeneration     Anemia  S/p transfusion of 4 units of PRC     Poorly controlled schizophrenia     G6PD limiting pain management options     Pt is G6P6 with BTL and desires hysterectomy  P: Transfer to the care of Dr Debroah Loop      Considering Depo-Lupron therapy and Toradol trial for pain management

## 2011-04-12 NOTE — ED Notes (Signed)
Patient transported to CT 

## 2011-04-12 NOTE — Progress Notes (Signed)
Unit #2 (Unit #: [id number]) of blood started at [facility] ED at 0225, pt arrived to [facility] room 373 at 0245 via [facility] with unit infusing... Unit finished transfusing at 0440... Volume infused 350 mL.

## 2011-04-12 NOTE — ED Notes (Signed)
[facility] called for transport 

## 2011-04-12 NOTE — ED Notes (Signed)
Spoke with Corrie Dandy RN in MAU regarding transfer and gave her report. In order to make things smoother for pt she may be transferred directly to a bed in [facility] (verified with Dr. Estanislado Pandy that she will be a 23hour observation pt).   RN at [facility] is to call for orders on pt arrival there.  Passed this along.  Await call back from RN at [facility] with directions on pt room.  Will call for transport after I hear back from her.

## 2011-04-12 NOTE — ED Notes (Signed)
Gave pt PRBC unit # [id number].  A pos.  Verified with 2nd RN using pt DOB, MRN, BLood bank bracelet.  Initial rate of administration 44ml/h for 15 minutes.  Will continue to monitor.

## 2011-04-13 ENCOUNTER — Other Ambulatory Visit: Payer: Self-pay | Admitting: *Deleted

## 2011-04-13 ENCOUNTER — Telehealth: Payer: Self-pay | Admitting: *Deleted

## 2011-04-13 DIAGNOSIS — D259 Leiomyoma of uterus, unspecified: Secondary | ICD-10-CM

## 2011-04-13 MED ORDER — MEDROXYPROGESTERONE ACETATE 5 MG PO TABS
5.0000 mg | ORAL_TABLET | Freq: Every day | ORAL | Status: DC
  Administered 2011-04-13: 5 mg via ORAL
  Filled 2011-04-13 (×2): qty 1

## 2011-04-13 MED ORDER — MEDROXYPROGESTERONE ACETATE 5 MG PO TABS: 5.0000 mg | ORAL_TABLET | Freq: Every day | ORAL | Status: DC

## 2011-04-13 MED ORDER — PRENATAL MULTIVITAMIN CH: 1.0000 | ORAL_TABLET | Freq: Every day | ORAL | Status: DC

## 2011-04-13 MED ORDER — PROMETHAZINE HCL 25 MG PO TABS: ORAL_TABLET | ORAL | Status: DC

## 2011-04-13 MED ORDER — LEUPROLIDE ACETATE 3.75 MG IM KIT: 3.7500 mg | PACK | Freq: Once | INTRAMUSCULAR | Status: AC

## 2011-04-13 MED ORDER — HYDROCODONE-ACETAMINOPHEN 5-500 MG PO TABS: 1.0000 | ORAL_TABLET | Freq: Four times a day (QID) | ORAL | Status: AC | PRN

## 2011-04-13 NOTE — Discharge Summary (Signed)
Physician Discharge Summary  Patient ID: Valerie Lee MRN: [id number] DOB/AGE: 30-Aug-1975 36 y.o.`  Admit date: 04/11/2011 Discharge date: 04/13/2011  Admission Diagnoses:  Discharge Diagnoses:  Active Problems:  * No active hospital problems. *    Discharged Condition: improved   Hospital Course: Patient was admitted for menorrhagia and anemia, dysmenorrhea with fibroid uterus. She was transfused and received Lupron [place], Provera  Consults: none  Significant Diagnostic Studies: labs: HB 5.9 at admission  Treatments: IV hydration, analgesia: Dilaudid and Lupron. Transfused 4 units PRBC  Discharge Exam: Blood pressure 126/72, pulse 55, temperature 98.6 F (37 C), temperature source Oral, resp. rate 18, height 5' (1.524 m), weight 128 lb (58.06 kg), SpO2 99.00%. General appearance: alert, cooperative and no distress GI: soft, not tender, low abdominal mass to umbilicus  Disposition: Home or Self Care  Discharge Orders    Future Appointments: Provider: Department: Dept Phone: Center:   05/19/2011 3:30 PM Myra C. Marice Potter, MD [facility] [phone] [facility]     Medication List  As of 04/13/2011  2:46 PM   TAKE these medications         albuterol 108 (90 BASE) MCG/ACT inhaler   Commonly known as: PROVENTIL HFA;VENTOLIN HFA   Inhale 2 puffs into the lungs every 6 (six) hours as needed.      diphenhydrAMINE 25 MG tablet   Commonly known as: BENADRYL   Take 25 mg by mouth every 6 (six) hours as needed. For allergies, itching, and sleep      docusate sodium 100 MG capsule   Commonly known as: COLACE   Take 100 mg by mouth 2 (two) times daily.      ferrous sulfate 325 (65 FE) MG tablet   Take 325 mg by mouth daily with breakfast.      HYDROcodone-acetaminophen 5-500 MG per tablet   Commonly known as: VICODIN   Take 1 tablet by mouth every 6 (six) hours as needed for pain.      leuprolide 3.75 MG injection   Commonly known as: LUPRON   Inject 3.75 mg into the  muscle once.      lurasidone 80 MG Tabs   Commonly known as: LATUDA   Take 80 mg by mouth daily with breakfast.      medroxyPROGESTERone 5 MG tablet   Commonly known as: PROVERA   Take 1 tablet (5 mg total) by mouth daily.      prenatal multivitamin Tabs   Take 1 tablet by mouth daily.      risperiDONE 1 MG tablet   Commonly known as: RISPERDAL   Take 1 mg by mouth 2 (two) times daily.           Follow-up Information    Follow up with [facility] in 2 weeks.         Signed: Depaul Arizpe 04/13/2011, 2:46 PM

## 2011-04-13 NOTE — Telephone Encounter (Signed)
Pt left message stating that she was calling for Dr. Debroah Loop. She said that since leaving the hospital she is getting very sick. She reported increased amount of bleeding and nausea. Also, she states that she called to get her follow up appt in 2 wks as directed by Dr. Debroah Loop and was told no appts available. I called pt and discussed her concerns. She stated that she is having some dizziness and the bleeding has increased. She reports that she is wearing 2 Depends at a time because she soaks through pads too quickly. I asked pt what time she had received her injection (Lupron) today and she stated that she was given a Rx for the medication which she has not picked up from the pharmacy. I called Dr. Debroah Loop to tell him of the pt's status and he said he had wanted the pt to have the injection while in the hospital. He also would like pt to take provera 5 mg today when she gets the Rx even though she already had a dose while inpatient. I then spoke w/pt again and asked if she could come to the clinic today so that we could administer her injection. She does not have a ride until tomorrow @ 1 pm. I asked pt to come in @ that time and she agreed. Pt will also obtain Rx for provera today and take as directed by Dr. Debroah Loop. I advised pt not to be active today and if she felt her condition deteriorated, she should go to MAU. I offered pt Rx for nausea medication as she previously reported this problem. Pt stated that she would like that. I informed her of her follow up appt on 04/27/11 @ 1315 and this appt will take the place of the one that she was given for February. Pt voiced understanding of all instructions and information. I asked if there was anything else I could do for her and she responded that I had taken care of everything for her.

## 2011-04-14 ENCOUNTER — Ambulatory Visit (INDEPENDENT_AMBULATORY_CARE_PROVIDER_SITE_OTHER): Payer: Medicaid Other

## 2011-04-14 VITALS — BP 108/66 | HR 72

## 2011-04-14 DIAGNOSIS — D219 Benign neoplasm of connective and other soft tissue, unspecified: Secondary | ICD-10-CM

## 2011-04-14 DIAGNOSIS — D259 Leiomyoma of uterus, unspecified: Secondary | ICD-10-CM

## 2011-04-14 MED ORDER — LEUPROLIDE ACETATE (3 MONTH) 11.25 MG IM KIT
11.2500 mg | PACK | Freq: Once | INTRAMUSCULAR | Status: AC
  Administered 2011-04-14: 11.25 mg via INTRAMUSCULAR

## 2011-04-27 ENCOUNTER — Ambulatory Visit: Payer: Medicaid Other | Admitting: Obstetrics & Gynecology

## 2011-04-29 ENCOUNTER — Inpatient Hospital Stay (HOSPITAL_COMMUNITY)
Admission: AD | Admit: 2011-04-29 | Discharge: 2011-04-29 | Disposition: A | Discharge status: Home / Self Care | Payer: Medicaid Other | Source: Ambulatory Visit | Attending: Obstetrics and Gynecology | Admitting: Obstetrics and Gynecology

## 2011-04-29 DIAGNOSIS — R109 Unspecified abdominal pain: Secondary | ICD-10-CM | POA: Insufficient documentation

## 2011-04-29 NOTE — Progress Notes (Signed)
Pt states, " I've had pain in my low abdomen for about a month. I was in the hospital 2 weeks ago far my fibroid tumors. The pain has been worse for two days. I've been taking X-Strenght tylenol but it doesn't help."

## 2011-04-29 NOTE — ED Notes (Signed)
Called, pt not in lobby.

## 2011-04-29 NOTE — ED Notes (Signed)
Called, not in lobby 

## 2011-04-29 NOTE — Progress Notes (Signed)
NOT IN LOBBY 

## 2011-04-30 ENCOUNTER — Inpatient Hospital Stay (HOSPITAL_COMMUNITY)
Admission: AD | Admit: 2011-04-30 | Discharge: 2011-04-30 | Disposition: A | Discharge status: Home / Self Care | Payer: Medicaid Other | Source: Ambulatory Visit | Attending: Family Medicine | Admitting: Family Medicine

## 2011-04-30 ENCOUNTER — Encounter (HOSPITAL_COMMUNITY): Payer: Self-pay | Admitting: Obstetrics and Gynecology

## 2011-04-30 DIAGNOSIS — R109 Unspecified abdominal pain: Secondary | ICD-10-CM | POA: Insufficient documentation

## 2011-04-30 DIAGNOSIS — N390 Urinary tract infection, site not specified: Secondary | ICD-10-CM

## 2011-04-30 DIAGNOSIS — D219 Benign neoplasm of connective and other soft tissue, unspecified: Secondary | ICD-10-CM

## 2011-04-30 DIAGNOSIS — D259 Leiomyoma of uterus, unspecified: Secondary | ICD-10-CM

## 2011-04-30 HISTORY — DX: Anemia due to glucose-6-phosphate dehydrogenase (g6pd) deficiency: D55.0

## 2011-04-30 HISTORY — DX: Anemia, unspecified: D64.9

## 2011-04-30 HISTORY — DX: Schizophrenia, unspecified: F20.9

## 2011-04-30 HISTORY — DX: Mental disorder, not otherwise specified: F99

## 2011-04-30 LAB — URINALYSIS, MICROSCOPIC ONLY
Leukocytes, UA: NEGATIVE
Protein, ur: NEGATIVE mg/dL
Specific Gravity, Urine: 1.025 (ref 1.005–1.030)
Urobilinogen, UA: 0.2 mg/dL (ref 0.0–1.0)

## 2011-04-30 LAB — CBC
HCT: 32.6 % — ABNORMAL LOW (ref 36.0–46.0)
Hemoglobin: 9.8 g/dL — ABNORMAL LOW (ref 12.0–15.0)
MCH: 23.8 pg — ABNORMAL LOW (ref 26.0–34.0)
MCHC: 30.1 g/dL (ref 30.0–36.0)
MCV: 79.1 fL (ref 78.0–100.0)

## 2011-04-30 MED ORDER — CIPROFLOXACIN HCL 500 MG PO TABS: 500.0000 mg | ORAL_TABLET | Freq: Two times a day (BID) | ORAL | Status: AC

## 2011-04-30 MED ORDER — HYDROCODONE-ACETAMINOPHEN 5-325 MG PO TABS
2.0000 | ORAL_TABLET | Freq: Once | ORAL | Status: AC
  Administered 2011-04-30: 2 via ORAL
  Filled 2011-04-30: qty 2

## 2011-04-30 MED ORDER — HYDROCODONE-ACETAMINOPHEN 5-500 MG PO TABS: 1.0000 | ORAL_TABLET | Freq: Four times a day (QID) | ORAL | Status: AC | PRN

## 2011-04-30 NOTE — ED Provider Notes (Signed)
History   Valerie Lee is a 36 y.o. year old [id number] female who presents to MAU reporting generalized low abd pain. She has a Hx of multiple fibroids and was hospitalized for pain due to degenerating fibroids, menorrhagia and blood transfusion in January. She has an appointment w/ Dr. Debroah Loop in Gyn clinic 05/12/11 to discuss surgery. She was Rx Vicodin #30 on 04/13/11 and is requesting pain meds today.  Fibroids, abd pain  CSN: [id number]  Arrival date & time 04/30/11  1128   None     Chief Complaint  Patient presents with  . Abdominal Pain    (Consider location/radiation/quality/duration/timing/severity/associated sxs/prior treatment) HPI  Past Medical History  Diagnosis Date  . Asthma   . G6PD (glucose 6 phosphatase deficiency)   . Anemia   . Mental disorder   . G6PD deficiency anemia   . Schizophrenia   . Schizophrenia     Past Surgical History  Procedure Date  . Cholecystectomy   . Tubal ligation     History reviewed. No pertinent family history.  History  Substance Use Topics  . Smoking status: Current Everyday Smoker -- 0.2 packs/day    Types: Cigarettes  . Smokeless tobacco: Not on file  . Alcohol Use: No    OB History    Grav Para Term Preterm Abortions TAB SAB Ect Mult Living   6 6 5 1  0 0 0 0 0 6      Review of Systems  Constitutional: Negative for fever and chills.  Gastrointestinal: Positive for abdominal pain. Negative for nausea, vomiting, diarrhea, constipation and abdominal distention.  Genitourinary: Negative for dysuria, urgency, frequency, vaginal bleeding and vaginal discharge.  Musculoskeletal: Negative for back pain.    Allergies  Bactrim; Codeine; Penicillins; and Ibuprofen  Home Medications  No current outpatient prescriptions on file.  BP 102/56  Pulse 83  Temp(Src) 98.7 F (37.1 C) (Oral)  Resp 18  Ht 5' 0.5" (1.537 m)  Wt 53.615 kg (118 lb 3.2 oz)  BMI 22.70 kg/m2  LMP 04/12/2011  Physical Exam  Nursing note  and vitals reviewed. Constitutional: She is oriented to person, place, and time. She appears well-developed and well-nourished. She appears distressed (in fetal position, grimacing).  Cardiovascular: Normal rate.   Pulmonary/Chest: Effort normal.  Abdominal: Soft. She exhibits mass (uterus). There is no tenderness. There is no rebound and no guarding.  Neurological: She is alert and oriented to person, place, and time.  Skin: Skin is warm and dry.  Psychiatric: She has a normal mood and affect.    ED Course  Procedures (including critical care time)  Labs Reviewed  URINALYSIS, WITH MICROSCOPIC - Abnormal; Notable for the following:    APPearance HAZY (*)    Hgb urine dipstick LARGE (*)    Bacteria, UA MANY (*)    Squamous Epithelial / LPF FEW (*)    All other components within normal limits  CBC - Abnormal; Notable for the following:    Hemoglobin 9.8 (*)    HCT 32.6 (*)    MCH 23.8 (*)    RDW 23.2 (*)    Platelets 428 (*)    All other components within normal limits  POCT PREGNANCY, URINE  URINALYSIS, ROUTINE W REFLEX MICROSCOPIC   Pain improved w/ 2 Vicodin   1. Fibroid   2. UTI (lower urinary tract infection)    MDM  D/C home per consult w/Dr. Shawnie Pons Rx Vicodin #60 RX Cipro F/U AS 05/12/11 w/ Dr. Debroah Loop in [place] [facility]  Dorathy Kinsman 05/03/2011 4:02 PM

## 2011-04-30 NOTE — Progress Notes (Signed)
Pt presents to MAU with chief complaint of abdominal pain. Pt is a non pregnant female with a significant history of fibroids; surgery has been discussed. Pt is is here for pain management, has an appointment with Dr. Debroah Loop on Feb 14; says she cannot wait that long due to pain.

## 2011-04-30 NOTE — Progress Notes (Signed)
Constant Lower abd pian and dizzy spells x 3 days. Pt reports having a period that started on 1/15 and just stopped bleeding yesterday. Reports bleeding heavier than usual. Still having pain.

## 2011-05-03 NOTE — ED Provider Notes (Signed)
Chart reviewed and agree with management and plan.  

## 2011-05-12 ENCOUNTER — Ambulatory Visit (INDEPENDENT_AMBULATORY_CARE_PROVIDER_SITE_OTHER): Payer: Medicaid Other | Admitting: Obstetrics and Gynecology

## 2011-05-12 ENCOUNTER — Encounter: Payer: Self-pay | Admitting: Obstetrics and Gynecology

## 2011-05-12 ENCOUNTER — Other Ambulatory Visit (HOSPITAL_COMMUNITY)
Admission: RE | Admit: 2011-05-12 | Discharge: 2011-05-12 | Disposition: A | Discharge status: Home / Self Care | Payer: Medicaid Other | Source: Ambulatory Visit | Attending: Obstetrics and Gynecology | Admitting: Obstetrics and Gynecology

## 2011-05-12 DIAGNOSIS — N938 Other specified abnormal uterine and vaginal bleeding: Secondary | ICD-10-CM

## 2011-05-12 DIAGNOSIS — N939 Abnormal uterine and vaginal bleeding, unspecified: Secondary | ICD-10-CM

## 2011-05-12 DIAGNOSIS — D259 Leiomyoma of uterus, unspecified: Secondary | ICD-10-CM | POA: Insufficient documentation

## 2011-05-12 DIAGNOSIS — N949 Unspecified condition associated with female genital organs and menstrual cycle: Secondary | ICD-10-CM

## 2011-05-12 DIAGNOSIS — Z01419 Encounter for gynecological examination (general) (routine) without abnormal findings: Secondary | ICD-10-CM | POA: Insufficient documentation

## 2011-05-12 DIAGNOSIS — R102 Pelvic and perineal pain: Secondary | ICD-10-CM | POA: Insufficient documentation

## 2011-05-12 MED ORDER — LEUPROLIDE ACETATE 3.75 MG IM KIT
3.7500 mg | PACK | Freq: Once | INTRAMUSCULAR | Status: AC
  Administered 2011-05-12: 3.75 mg via INTRAMUSCULAR

## 2011-05-12 NOTE — Progress Notes (Signed)
Subjective:    Patient ID: Valerie Lee, female    DOB: October 30, 1975, 36 y.o.   MRN: [id number]  HPI 36 yo [id number] with LMP 04/12/2011 and BMI 22 presenting today requesting surgical consult. Patient reports experiencing heavy vaginal bleeding lasting 2-4 weeks. Patient reports going through 20 Depends in less than one month. Patient also reports Jozalyn Baglio pelvic pain worst with her menses. Patient is requesting a hysterectomy. Patient has been admitted for blood transfusion in January 2013.   Review of Systems  All other systems reviewed and are negative.   Past Medical History  Diagnosis Date  . Asthma   . G6PD (glucose 6 phosphatase deficiency)   . Anemia   . Mental disorder   . G6PD deficiency anemia   . Schizophrenia   . Schizophrenia    Past Surgical History  Procedure Date  . Cholecystectomy   . Tubal ligation    History reviewed. No pertinent family history. History  Substance Use Topics  . Smoking status: Current Everyday Smoker -- 0.2 packs/day    Types: Cigarettes  . Smokeless tobacco: Not on file  . Alcohol Use: No       Objective:   Physical Exam  GENERAL: Well-developed, well-nourished female in no acute distress.  HEENT: Normocephalic, atraumatic. Sclerae anicteric.  NECK: Supple. Normal thyroid.  LUNGS: Clear to auscultation bilaterally.  HEART: Regular rate and rhythm. BREASTS: Symmetric in size. No palpable masses or lymphadenopathy, skin changes, or nipple drainage. ABDOMEN: Soft, nontender, nondistended. No organomegaly. PELVIC: Normal external female genitalia. Vagina is pink and rugated.  Normal discharge. Normal appearing cervix. Uterus is 18-week size with palpable fibroids. No adnexal mass or tenderness. EXTREMITIES: No cyanosis, clubbing, or edema, 2+ distal pulses.    Pelvic ultrasound: Uterus: Markedly enlarged, measuring 17.0 cm in length, 8.6 cm in  AP dimension and 9.8 cm in transverse dimension. Multiple large  fibroids are again  seen, the largest of which measures 6.2 cm in  Size. Normal ovaries  Assessment & Plan:  36 yo [phone] with abnormal uterine bleeding and fibroid uterus - pap smear performed  - ENDOMETRIAL BIOPSY     The indications for endometrial biopsy were reviewed.   Risks of the biopsy including cramping, bleeding, infection, uterine perforation, inadequate specimen and need for additional procedures  were discussed. The patient states she understands and agrees to undergo procedure today. Consent was signed. Time out was performed. Urine HCG was negative. A sterile speculum was placed in the patient's vagina and the cervix was prepped with Betadine. A single-toothed tenaculum was placed on the anterior lip of the cervix to stabilize it. The uterine cavity was sounded to a depth of 14 cm using the uterine sound. The 3 mm pipelle was introduced into the endometrial cavity without difficulty, 2 passes were made.  A  moderate amount of tissue was  sent to pathology. The instruments were removed from the patient's vagina. Minimal bleeding from the cervix was noted. The patient tolerated the procedure well.  Routine post-procedure instructions were given to the patient. The patient will follow up in two weeks to review the results and for further management.   - Patient will be scheduled for hysterectomy. Risk, benefits and alternatives were explained including but not limited to risk of bleeding, infection and damage to adjacent organs. Patient verbalized understanding. All questions were answered. Patient will be scheduled for TAH.  - Patient to receive Depo-Lupron today.

## 2011-05-12 NOTE — Progress Notes (Signed)
Addended by: Lynnell Dike on: 05/12/2011 03:02 PM   Modules accepted: Orders

## 2011-05-12 NOTE — Progress Notes (Signed)
Addended by: Faythe Casa on: 05/12/2011 02:59 PM   Modules accepted: Orders

## 2011-05-18 ENCOUNTER — Encounter (HOSPITAL_COMMUNITY): Payer: Self-pay | Admitting: Pharmacist

## 2011-05-19 ENCOUNTER — Encounter: Payer: Medicaid Other | Admitting: Obstetrics & Gynecology

## 2011-05-19 ENCOUNTER — Encounter (HOSPITAL_COMMUNITY): Payer: Self-pay

## 2011-05-19 ENCOUNTER — Encounter (HOSPITAL_COMMUNITY)
Admission: RE | Admit: 2011-05-19 | Discharge: 2011-05-19 | Disposition: A | Discharge status: Home / Self Care | Payer: Medicaid Other | Source: Ambulatory Visit | Attending: Obstetrics and Gynecology | Admitting: Obstetrics and Gynecology

## 2011-05-19 HISTORY — DX: Encounter for other specified aftercare: Z51.89

## 2011-05-19 LAB — CBC
MCH: 24.7 pg — ABNORMAL LOW (ref 26.0–34.0)
MCHC: 30.5 g/dL (ref 30.0–36.0)
MCV: 80.9 fL (ref 78.0–100.0)
Platelets: 278 10*3/uL (ref 150–400)
RDW: 23.5 % — ABNORMAL HIGH (ref 11.5–15.5)

## 2011-05-19 NOTE — Patient Instructions (Addendum)
   Your procedure is scheduled [id number] Feb 28th  Enter through the Main Entrance of [facility] at:11am Pick up the phone at the desk and dial [phone] and inform us of your arrival.  Please call this number if you have any problems the morning of surgery: [phone]  Remember: Do not eat food after midnight:Wednesday Do not drink clear liquids after:8:30am Thursday Take these medicines the morning of surgery with a SIP OF WATER: Risperdal in morning day of surgery as taking, please bring your albuterol inhaler with you to hospital. Do not wear jewelry, make-up, or FINGER nail polish Do not wear lotions, powders, perfumes or deodorant. Do not shave 48 hours prior to surgery. Do not bring valuables to the hospital.  Leave suitcase in the car. After Surgery it may be brought to your room. For patients being admitted to the hospital, checkout time is 11:00am the day of discharge.  Patients discharged on the day of surgery will not be allowed to drive home.     Remember to use your hibiclens as instructed.Please shower with 1/2 bottle the evening before your surgery and the other 1/2 bottle the morning of surgery.

## 2011-05-25 MED ORDER — GENTAMICIN IN SALINE 1-0.9 MG/ML-% IV SOLN
100.0000 mg | INTRAVENOUS | Status: DC
  Filled 2011-05-25: qty 100

## 2011-05-25 MED ORDER — METRONIDAZOLE IN NACL 5-0.79 MG/ML-% IV SOLN
500.0000 mg | INTRAVENOUS | Status: DC
  Filled 2011-05-25: qty 100

## 2011-05-25 NOTE — H&P (Signed)
Valerie Lee is an 36 y.o. female [phone] with abnormal uterine bleeding and fibroid uterus presenting today for scheduled hysterectomy. Patient reports heavy vaginal bleeding with her menses to the point that she has to use Depends underwear. Patient was also admitted for blood transfusion in January as a result of severe anemia. Patient requesting definitive treatment with hysterectomy.  Pertinent Gynecological History: Bleeding: dysfunctional uterine bleeding Contraception: tubal ligation DES exposure: denies Blood transfusions: January 2013 Sexually transmitted diseases: no past history Previous GYN Procedures: denies  Last mammogram: n/a  Last pap: normal Date: 05/12/2011    Menstrual History:  Patient's last menstrual period was 04/12/2011.    Past Medical History  Diagnosis Date  . G6PD (glucose 6 phosphatase deficiency)   . Anemia   . G6PD deficiency anemia   . Blood transfusion   . Asthma     rarely uses albuterol rescue  . Mental disorder   . Schizophrenia   . Schizophrenia     Past Surgical History  Procedure Date  . Tubal ligation   . Cholecystectomy 2011    History reviewed. No pertinent family history.  Social History:  reports that she has been smoking Cigarettes.  She has a 3 pack-year smoking history. She does not have any smokeless tobacco history on file. She reports that she does not drink alcohol or use illicit drugs.  Allergies:  Allergies  Allergen Reactions  . Bactrim Anaphylaxis, Itching and Swelling  . Codeine Itching and Swelling    Hallucinations Pt can take vicodin with no reaction  . Penicillins Anaphylaxis, Itching and Swelling  . Ibuprofen Other (See Comments)    Lowers hemoglobin - pt has G6PD deficiency    Prescriptions prior to admission  Medication Sig Dispense Refill  . risperiDONE (RISPERDAL) 1 MG tablet Take 1 mg by mouth 2 (two) times daily.      Marland Kitchen albuterol (PROVENTIL HFA;VENTOLIN HFA) 108 (90 BASE) MCG/ACT inhaler  Inhale 2 puffs into the lungs every 6 (six) hours as needed. For asthma       . diphenhydrAMINE (BENADRYL) 25 MG tablet Take 25 mg by mouth at bedtime.       Marland Kitchen lurasidone (LATUDA) 80 MG TABS Take 80 mg by mouth at bedtime.         Review of Systems  All other systems reviewed and are negative.    Blood pressure 106/62, pulse 77, temperature 98.1 F (36.7 C), temperature source Oral, resp. rate 18, last menstrual period 04/12/2011, SpO2 100.00%. Physical Exam GENERAL: Well-developed, well-nourished female in no acute distress.  HEENT: Normocephalic, atraumatic. Sclerae anicteric.  NECK: Supple. Normal thyroid.  LUNGS: Clear to auscultation bilaterally.  HEART: Regular rate and rhythm. BREASTS: Symmetric in size. No palpable masses or lymphadenopathy, skin changes, or nipple drainage. ABDOMEN: Soft, nontender, nondistended. No organomegaly. PELVIC: Normal external female genitalia. Vagina is pink and rugated.  Normal discharge. Normal appearing cervix. 18-week size fibroid uterus. No adnexal mass or tenderness. EXTREMITIES: No cyanosis, clubbing, or edema, 2+ distal pulses.  Results for orders placed during the hospital encounter of 05/26/11 (from the past 24 hour(s))  PREGNANCY, URINE     Status: Normal   Collection Time   05/26/11 11:08 AM      Component Value Range   Preg Test, Ur NEGATIVE  NEGATIVE     05/12/2011 EmBx: benign secretory endometrium without evidence of hyperplasia or malignancy  Assessment/Plan: 36 yo [id number] with abnormal uterine bleeding and fibroid uterus requesting hysterectomy - Risks, benefits and alternatives were  explained including but not limited to risk of bleeding, infection and damage to adjacent organs. Patient verbalized understanding and all questions were answered. - It was explained to the patient that there is a great likelihood that her abnormal uterine bleeding will be resolved following this procedure.  Denton Derks 05/26/2011, 12:14  PM

## 2011-05-26 ENCOUNTER — Inpatient Hospital Stay (HOSPITAL_COMMUNITY): Payer: Medicaid Other | Admitting: Anesthesiology

## 2011-05-26 ENCOUNTER — Encounter (HOSPITAL_COMMUNITY): Payer: Self-pay | Admitting: Anesthesiology

## 2011-05-26 ENCOUNTER — Inpatient Hospital Stay (HOSPITAL_COMMUNITY)
Admission: RE | Admit: 2011-05-26 | Discharge: 2011-05-28 | DRG: 742 | Disposition: A | Discharge status: Home / Self Care | Payer: Medicaid Other | Source: Ambulatory Visit | Attending: Obstetrics and Gynecology | Admitting: Obstetrics and Gynecology

## 2011-05-26 ENCOUNTER — Encounter (HOSPITAL_COMMUNITY)
Admission: RE | Disposition: A | Discharge status: Home / Self Care | Payer: Self-pay | Source: Ambulatory Visit | Attending: Obstetrics and Gynecology

## 2011-05-26 ENCOUNTER — Encounter (HOSPITAL_COMMUNITY): Payer: Self-pay | Admitting: *Deleted

## 2011-05-26 DIAGNOSIS — D252 Subserosal leiomyoma of uterus: Secondary | ICD-10-CM | POA: Diagnosis present

## 2011-05-26 DIAGNOSIS — D251 Intramural leiomyoma of uterus: Secondary | ICD-10-CM | POA: Diagnosis present

## 2011-05-26 DIAGNOSIS — D649 Anemia, unspecified: Secondary | ICD-10-CM | POA: Diagnosis present

## 2011-05-26 DIAGNOSIS — R102 Pelvic and perineal pain: Secondary | ICD-10-CM

## 2011-05-26 DIAGNOSIS — D259 Leiomyoma of uterus, unspecified: Secondary | ICD-10-CM

## 2011-05-26 DIAGNOSIS — N920 Excessive and frequent menstruation with regular cycle: Secondary | ICD-10-CM | POA: Diagnosis present

## 2011-05-26 DIAGNOSIS — D250 Submucous leiomyoma of uterus: Principal | ICD-10-CM | POA: Diagnosis present

## 2011-05-26 DIAGNOSIS — N949 Unspecified condition associated with female genital organs and menstrual cycle: Secondary | ICD-10-CM

## 2011-05-26 DIAGNOSIS — E7400 Glycogen storage disease, unspecified: Secondary | ICD-10-CM | POA: Diagnosis present

## 2011-05-26 DIAGNOSIS — Z01818 Encounter for other preprocedural examination: Secondary | ICD-10-CM

## 2011-05-26 DIAGNOSIS — Z01812 Encounter for preprocedural laboratory examination: Secondary | ICD-10-CM

## 2011-05-26 HISTORY — PX: ABDOMINAL HYSTERECTOMY: SHX81

## 2011-05-26 LAB — TYPE AND SCREEN
ABO/RH(D): A POS
Antibody Screen: NEGATIVE

## 2011-05-26 SURGERY — HYSTERECTOMY, ABDOMINAL
Anesthesia: General | Site: Abdomen | Wound class: Clean Contaminated

## 2011-05-26 MED ORDER — MUPIROCIN 2 % EX OINT
TOPICAL_OINTMENT | CUTANEOUS | Status: AC
  Administered 2011-05-26: 1 via NASAL
  Filled 2011-05-26: qty 22

## 2011-05-26 MED ORDER — FENTANYL CITRATE 0.05 MG/ML IJ SOLN
INTRAMUSCULAR | Status: AC
  Filled 2011-05-26: qty 2

## 2011-05-26 MED ORDER — LURASIDONE HCL 80 MG PO TABS
80.0000 mg | ORAL_TABLET | Freq: Every day | ORAL | Status: DC
  Administered 2011-05-26 – 2011-05-27 (×2): 80 mg via ORAL
  Filled 2011-05-26 (×2): qty 1

## 2011-05-26 MED ORDER — FENTANYL CITRATE 0.05 MG/ML IJ SOLN
INTRAMUSCULAR | Status: AC
  Administered 2011-05-26: 50 ug via INTRAVENOUS
  Filled 2011-05-26: qty 2

## 2011-05-26 MED ORDER — MIDAZOLAM HCL 2 MG/2ML IJ SOLN
INTRAMUSCULAR | Status: AC
  Filled 2011-05-26: qty 2

## 2011-05-26 MED ORDER — LACTATED RINGERS IV SOLN
INTRAVENOUS | Status: DC
  Administered 2011-05-26 (×2): via INTRAVENOUS

## 2011-05-26 MED ORDER — ONDANSETRON HCL 4 MG/2ML IJ SOLN
INTRAMUSCULAR | Status: AC
  Filled 2011-05-26: qty 2

## 2011-05-26 MED ORDER — DIPHENHYDRAMINE HCL 12.5 MG/5ML PO ELIX: 12.5000 mg | ORAL_SOLUTION | Freq: Four times a day (QID) | ORAL | Status: DC | PRN

## 2011-05-26 MED ORDER — RISPERIDONE 1 MG PO TABS
1.0000 mg | ORAL_TABLET | Freq: Two times a day (BID) | ORAL | Status: DC
  Administered 2011-05-26 – 2011-05-28 (×4): 1 mg via ORAL
  Filled 2011-05-26 (×4): qty 1

## 2011-05-26 MED ORDER — NEOSTIGMINE METHYLSULFATE 1 MG/ML IJ SOLN
INTRAMUSCULAR | Status: DC | PRN
  Administered 2011-05-26: 4 mg via INTRAVENOUS

## 2011-05-26 MED ORDER — DIPHENHYDRAMINE HCL 50 MG/ML IJ SOLN
12.5000 mg | Freq: Four times a day (QID) | INTRAMUSCULAR | Status: DC | PRN
  Administered 2011-05-27: 12.5 mg via INTRAVENOUS
  Filled 2011-05-26: qty 1

## 2011-05-26 MED ORDER — HYDROMORPHONE HCL PF 1 MG/ML IJ SOLN
0.2500 mg | INTRAMUSCULAR | Status: DC | PRN
  Administered 2011-05-26 (×2): 0.5 mg via INTRAVENOUS

## 2011-05-26 MED ORDER — HYDROMORPHONE HCL PF 1 MG/ML IJ SOLN
INTRAMUSCULAR | Status: AC
  Administered 2011-05-26: 0.5 mg via INTRAVENOUS
  Filled 2011-05-26: qty 1

## 2011-05-26 MED ORDER — LIDOCAINE HCL (CARDIAC) 20 MG/ML IV SOLN
INTRAVENOUS | Status: DC | PRN
  Administered 2011-05-26: 40 mg via INTRAVENOUS

## 2011-05-26 MED ORDER — ALBUTEROL SULFATE HFA 108 (90 BASE) MCG/ACT IN AERS: 2.0000 | INHALATION_SPRAY | RESPIRATORY_TRACT | Status: DC | PRN

## 2011-05-26 MED ORDER — GLYCOPYRROLATE 0.2 MG/ML IJ SOLN
INTRAMUSCULAR | Status: AC
  Filled 2011-05-26: qty 1

## 2011-05-26 MED ORDER — GLYCOPYRROLATE 0.2 MG/ML IJ SOLN
INTRAMUSCULAR | Status: DC | PRN
  Administered 2011-05-26: .08 mg via INTRAVENOUS

## 2011-05-26 MED ORDER — FENTANYL CITRATE 0.05 MG/ML IJ SOLN
INTRAMUSCULAR | Status: DC | PRN
  Administered 2011-05-26 (×4): 50 ug via INTRAVENOUS
  Administered 2011-05-26: 100 ug via INTRAVENOUS
  Administered 2011-05-26: 50 ug via INTRAVENOUS

## 2011-05-26 MED ORDER — HYDROMORPHONE 0.3 MG/ML IV SOLN
INTRAVENOUS | Status: AC
  Filled 2011-05-26: qty 25

## 2011-05-26 MED ORDER — MUPIROCIN 2 % EX OINT
TOPICAL_OINTMENT | Freq: Two times a day (BID) | CUTANEOUS | Status: DC
  Administered 2011-05-26: 1 via NASAL

## 2011-05-26 MED ORDER — FENTANYL CITRATE 0.05 MG/ML IJ SOLN
25.0000 ug | INTRAMUSCULAR | Status: DC | PRN
  Administered 2011-05-26 (×3): 50 ug via INTRAVENOUS

## 2011-05-26 MED ORDER — MIDAZOLAM HCL 5 MG/5ML IJ SOLN
INTRAMUSCULAR | Status: DC | PRN
  Administered 2011-05-26: 2 mg via INTRAVENOUS

## 2011-05-26 MED ORDER — LIDOCAINE HCL (CARDIAC) 20 MG/ML IV SOLN
INTRAVENOUS | Status: AC
  Filled 2011-05-26: qty 5

## 2011-05-26 MED ORDER — GENTAMICIN IN SALINE 1.6-0.9 MG/ML-% IV SOLN
INTRAVENOUS | Status: DC | PRN
  Administered 2011-05-26: 100 mg via INTRAVENOUS

## 2011-05-26 MED ORDER — PROPOFOL 10 MG/ML IV EMUL
INTRAVENOUS | Status: AC
  Filled 2011-05-26: qty 50

## 2011-05-26 MED ORDER — LACTATED RINGERS IV SOLN
INTRAVENOUS | Status: DC | PRN
  Administered 2011-05-26 (×2): via INTRAVENOUS

## 2011-05-26 MED ORDER — FENTANYL CITRATE 0.05 MG/ML IJ SOLN
INTRAMUSCULAR | Status: AC
  Filled 2011-05-26: qty 5

## 2011-05-26 MED ORDER — LACTATED RINGERS IV SOLN
INTRAVENOUS | Status: DC
  Administered 2011-05-26: 23:00:00 via INTRAVENOUS

## 2011-05-26 MED ORDER — ROCURONIUM BROMIDE 100 MG/10ML IV SOLN
INTRAVENOUS | Status: DC | PRN
  Administered 2011-05-26: 30 mg via INTRAVENOUS

## 2011-05-26 MED ORDER — PROPOFOL 10 MG/ML IV EMUL
INTRAVENOUS | Status: DC | PRN
  Administered 2011-05-26: 130 mg via INTRAVENOUS
  Administered 2011-05-26: 20 mg via INTRAVENOUS

## 2011-05-26 MED ORDER — PROPOFOL 10 MG/ML IV EMUL
INTRAVENOUS | Status: AC
  Filled 2011-05-26: qty 20

## 2011-05-26 MED ORDER — ALBUTEROL SULFATE HFA 108 (90 BASE) MCG/ACT IN AERS
INHALATION_SPRAY | RESPIRATORY_TRACT | Status: AC
  Filled 2011-05-26: qty 6.7

## 2011-05-26 MED ORDER — ONDANSETRON HCL 4 MG/2ML IJ SOLN: 4.0000 mg | Freq: Four times a day (QID) | INTRAMUSCULAR | Status: DC | PRN

## 2011-05-26 MED ORDER — METRONIDAZOLE IN NACL 5-0.79 MG/ML-% IV SOLN
INTRAVENOUS | Status: DC | PRN
  Administered 2011-05-26: 500 mg via INTRAVENOUS

## 2011-05-26 MED ORDER — HYDROMORPHONE 0.3 MG/ML IV SOLN
INTRAVENOUS | Status: DC
  Administered 2011-05-26: 6 mg via INTRAVENOUS
  Administered 2011-05-26: 16:00:00 via INTRAVENOUS
  Administered 2011-05-26: 3 mg via INTRAVENOUS
  Administered 2011-05-27: 1.2 mg via INTRAVENOUS
  Administered 2011-05-27: 0.9 mg via INTRAVENOUS

## 2011-05-26 MED ORDER — ONDANSETRON HCL 4 MG/2ML IJ SOLN
INTRAMUSCULAR | Status: DC | PRN
  Administered 2011-05-26: 4 mg via INTRAVENOUS

## 2011-05-26 MED ORDER — SODIUM CHLORIDE 0.9 % IJ SOLN: 9.0000 mL | INTRAMUSCULAR | Status: DC | PRN

## 2011-05-26 MED ORDER — NEOSTIGMINE METHYLSULFATE 1 MG/ML IJ SOLN
INTRAMUSCULAR | Status: AC
  Filled 2011-05-26: qty 10

## 2011-05-26 MED ORDER — NALOXONE HCL 0.4 MG/ML IJ SOLN: 0.4000 mg | INTRAMUSCULAR | Status: DC | PRN

## 2011-05-26 MED ORDER — PROPOFOL 10 MG/ML IV EMUL
INTRAVENOUS | Status: DC | PRN
  Administered 2011-05-26: 120 ug/kg/min via INTRAVENOUS

## 2011-05-26 MED ORDER — ROCURONIUM BROMIDE 50 MG/5ML IV SOLN
INTRAVENOUS | Status: AC
  Filled 2011-05-26: qty 1

## 2011-05-26 SURGICAL SUPPLY — 30 items
CANISTER SUCTION 2500CC (MISCELLANEOUS) ×2 IMPLANT
CHLORAPREP W/TINT 26ML (MISCELLANEOUS) ×2 IMPLANT
CLOTH BEACON ORANGE TIMEOUT ST (SAFETY) ×2 IMPLANT
CONT PATH 16OZ SNAP LID 3702 (MISCELLANEOUS) ×2 IMPLANT
DRESSING TELFA 8X3 (GAUZE/BANDAGES/DRESSINGS) ×2 IMPLANT
GAUZE SPONGE 4X4 12PLY STRL LF (GAUZE/BANDAGES/DRESSINGS) ×2 IMPLANT
GAUZE SPONGE 4X4 16PLY XRAY LF (GAUZE/BANDAGES/DRESSINGS) ×2 IMPLANT
GLOVE BIOGEL PI IND STRL 6.5 (GLOVE) ×2 IMPLANT
GLOVE BIOGEL PI INDICATOR 6.5 (GLOVE) ×2
GLOVE SURG SS PI 6.0 STRL IVOR (GLOVE) ×2 IMPLANT
GOWN PREVENTION PLUS LG XLONG (DISPOSABLE) ×6 IMPLANT
NEEDLE HYPO 25X1 1.5 SAFETY (NEEDLE) ×2 IMPLANT
NS IRRIG 1000ML POUR BTL (IV SOLUTION) ×2 IMPLANT
PACK ABDOMINAL GYN (CUSTOM PROCEDURE TRAY) ×2 IMPLANT
PAD ABD 7.5X8 STRL (GAUZE/BANDAGES/DRESSINGS) ×2 IMPLANT
PAD OB MATERNITY 4.3X12.25 (PERSONAL CARE ITEMS) ×2 IMPLANT
PROTECTOR NERVE ULNAR (MISCELLANEOUS) ×2 IMPLANT
SPONGE LAP 18X18 X RAY DECT (DISPOSABLE) ×4 IMPLANT
STAPLER VISISTAT 35W (STAPLE) ×2 IMPLANT
SUT VIC AB 0 CT1 18XCR BRD8 (SUTURE) ×3 IMPLANT
SUT VIC AB 0 CT1 36 (SUTURE) ×8 IMPLANT
SUT VIC AB 0 CT1 8-18 (SUTURE) ×3
SUT VIC AB 3-0 FS2 27 (SUTURE) ×2 IMPLANT
SUT VIC AB 3-0 PS2 18 (SUTURE) ×1
SUT VIC AB 3-0 PS2 18XBRD (SUTURE) ×1 IMPLANT
SUT VICRYL 0 TIES 12 18 (SUTURE) ×2 IMPLANT
SYR CONTROL 10ML LL (SYRINGE) ×2 IMPLANT
TOWEL OR 17X24 6PK STRL BLUE (TOWEL DISPOSABLE) ×4 IMPLANT
TRAY FOLEY CATH 14FR (SET/KITS/TRAYS/PACK) ×2 IMPLANT
WATER STERILE IRR 1000ML POUR (IV SOLUTION) ×2 IMPLANT

## 2011-05-26 NOTE — Transfer of Care (Signed)
Immediate Anesthesia Transfer of Care Note  Patient: Valerie Lee  Procedure(s) Performed: Procedure(s) (LRB): HYSTERECTOMY ABDOMINAL (N/A)  Patient Location: PACU  Anesthesia Type: General  Level of Consciousness: awake, alert  and oriented  Airway & Oxygen Therapy: Patient Spontanous Breathing and Patient connected to nasal cannula oxygen  Post-op Assessment: Report given to PACU RN and Post -op Vital signs reviewed and stable  Post vital signs: Reviewed and stable  Complications: No apparent anesthesia complications

## 2011-05-26 NOTE — Anesthesia Preprocedure Evaluation (Addendum)
Anesthesia Evaluation  Patient identified by MRN, date of birth, ID band Patient awake    Reviewed: Allergy & Precautions, H&P , NPO status , Patient's Chart, lab work & pertinent test results, reviewed documented beta blocker date and time   History of Anesthesia Complications Negative for: history of anesthetic complications  Airway Mallampati: I TM Distance: >3 FB Neck ROM: full    Dental   Reports many broken or chipped teeth:   Pulmonary asthma (last inhaler use last week, uses once every few months) , Current Smoker (3 cigs/day),  clear to auscultation  Pulmonary exam normal       Cardiovascular Exercise Tolerance: Good neg cardio ROS regular Normal    Neuro/Psych PSYCHIATRIC DISORDERS (schizophrenia, ) Negative Neurological ROS     GI/Hepatic negative GI ROS, Neg liver ROS,   Endo/Other  Negative Endocrine ROS  Renal/GU negative Renal ROS  Genitourinary negative   Musculoskeletal   Abdominal   Peds  Hematology  (+) Blood dyscrasia (G6PD, has had multiple blood transfusions - last was in 1/13), anemia ,   Anesthesia Other Findings   Reproductive/Obstetrics negative OB ROS                          Anesthesia Physical Anesthesia Plan  ASA: II  Anesthesia Plan: General ETT   Post-op Pain Management:    Induction: Intravenous  Airway Management Planned: Oral ETT  Additional Equipment:   Intra-op Plan:   Post-operative Plan:   Informed Consent: I have reviewed the patients History and Physical, chart, labs and discussed the procedure including the risks, benefits and alternatives for the proposed anesthesia with the patient or authorized representative who has indicated his/her understanding and acceptance.   Dental Advisory Given  Plan Discussed with: CRNA and Surgeon  Anesthesia Plan Comments: (G6PD - avoid hypothermia, acidosis and hyperglycemia.  Avoid isoflurane,  sevoflurane, diazepam and methylene blue.  OK to use midazolam, fentanyl, propofol and ketamine.  Will plan TIVA.)      Anesthesia Quick Evaluation

## 2011-05-26 NOTE — Op Note (Addendum)
TONGELA ENCINAS PROCEDURE DATE: 05/26/2011  PREOPERATIVE DIAGNOSIS:  36 y.o. [id number] with symptomatic fibroids, menorrhagia POSTOPERATIVE DIAGNOSIS:  Same SURGEON:   Catalina Antigua, M.D. ASSISTANT:   Tinnie Gens OPERATION:  Total abdominal hysterectomy ANESTHESIA:  General endotracheal.  INDICATIONS: The patient is a 36 y.o. [id number] with history of symptomatic uterine fibroids/menorrhagia. The patient made a decision to undergo definite surgical treatment. On the preoperative visit, the risks, benefits, indications, and alternatives of the procedure were reviewed with the patient.  On the day of surgery, the risks of surgery were again discussed with the patient including but not limited to: bleeding which may require transfusion or reoperation; infection which may require antibiotics; injury to bowel, bladder, ureters or other surrounding organs; need for additional procedures; thromboembolic phenomenon, incisional problems and other postoperative/anesthesia complications. Written informed consent was obtained.    OPERATIVE FINDINGS: An 18-week size fibroid uterus with normal tubes and ovaries bilaterally.  ESTIMATED BLOOD LOSS: 200 ml FLUIDS:  1600 ml of Lactated Ringers URINE OUTPUT:  25 ml of clear yellow urine. SPECIMENS:  Uterus and cervix sent to pathology COMPLICATIONS:  None immediate.   DESCRIPTION OF PROCEDURE: The patient received intravenous antibiotics and had sequential compression devices applied to her lower extremities while in the preoperative area.   She was taken to the operating room where anesthesia was induced and found to be adequate. The patient was placed in supine position. The abdomen and perineum were prepped and draped in a sterile manner, and a Foley catheter was inserted into the bladder and attached to Jalayia Bagheri drainage. After an adequate timeout was performed, a Pfannensteil skin incision was made. This incision was taken down to the fascia using  electrocautery with care given to maintain good hemostasis. The fascia was incised in the midline and the fascial incision was then extended bilaterally using mayo scissors. The fascia was then dissected off the underlying rectus muscles using blunt and sharp dissection. The rectus muscles were split bluntly in the midline and the peritoneum entered sharply without complication. This peritoneal incision was then extended superiorly and inferiorly with care given to prevent bowel or bladder injury. Upon entry into the abdominal cavity, the upper abdomen was inspected and found to be normal. Attention was then turned to the pelvis. A retractor was placed into the incision, and the bowel was packed away with moist laparotomy sponges. The uterus at this point was noted to be mobilized. The round ligaments on each side were clamped, suture ligated, and transected with electrocautery allowing entry into the broad ligament. The anterior and posterior leaves of the broad ligament were separated and the ureters were inspected to be safely away from the area of dissection bilaterally. A hole was created in the clear portion of the posterior broad ligament. Adnexae were clamped on the patient's right side. This pedicle was then clamped, cut, and doubly suture ligated with good hemostasis. This procedure was repeated in an identical fashion on the left site allowing for both adnexa to remain in place.   A bladder flap was then created across the anterior leaf of the broad ligament and the bladder was then bluntly dissected off the lower uterine segment and cervix with good hemostasis. The uterine arteries were then skeletonized bilaterally and then clamped, cut, and ligated with care given to prevent ureteral injury. The uterosacral ligaments were then clamped, cut, and ligated bilaterally. Finally, the cardinal ligaments were clamped, cut, and ligated bilaterally.  Acutely curved clamps were placed across the vagina just under  the cervix, and the specimen was amputated and sent to pathology. The vaginal cuff was closed with a series of interrupted 0 Vicryl figure-of-eight sutures with care given to incorporate the anterior pubocervical fascia and the posterior rectovaginal fascia.  The vaginal cuff angles were noted to have good hemostasis.  The pelvis was irrigated and hemostasis was reconfirmed at all pedicles and along the pelvic sidewall.  All laparotomy sponges and instruments were removed from the abdomen. The fascia was closed with 0 Vicryl in a running fashion. The skin was closed with a 3-0 Vicryl subcuticular stitch. Sponge, lap, needle, and instrument counts were correct times two. The patient was taken to the recovery area awake, extubated and in stable condition.

## 2011-05-26 NOTE — Anesthesia Postprocedure Evaluation (Signed)
  Anesthesia Post-op Note  Patient: Valerie Lee  Procedure(s) Performed: Procedure(s) (LRB): HYSTERECTOMY ABDOMINAL (N/A) Patient is awake and responsive. Pain and nausea are reasonably well controlled. Vital signs are stable and clinically acceptable. Oxygen saturation is clinically acceptable. There are no apparent anesthetic complications at this time. Patient is ready for discharge.

## 2011-05-27 ENCOUNTER — Encounter (HOSPITAL_COMMUNITY): Payer: Self-pay | Admitting: Obstetrics and Gynecology

## 2011-05-27 LAB — CBC
HCT: 31.8 % — ABNORMAL LOW (ref 36.0–46.0)
Hemoglobin: 9.6 g/dL — ABNORMAL LOW (ref 12.0–15.0)
MCH: 24.4 pg — ABNORMAL LOW (ref 26.0–34.0)
MCV: 80.9 fL (ref 78.0–100.0)
RBC: 3.93 MIL/uL (ref 3.87–5.11)
WBC: 17.3 10*3/uL — ABNORMAL HIGH (ref 4.0–10.5)

## 2011-05-27 MED ORDER — HYDROMORPHONE HCL 2 MG PO TABS
2.0000 mg | ORAL_TABLET | Freq: Once | ORAL | Status: AC
  Administered 2011-05-28: 2 mg via ORAL
  Filled 2011-05-27 (×2): qty 1

## 2011-05-27 MED ORDER — PROMETHAZINE HCL 25 MG/ML IJ SOLN
12.5000 mg | Freq: Four times a day (QID) | INTRAMUSCULAR | Status: DC | PRN
  Administered 2011-05-27: 12.5 mg via INTRAVENOUS
  Filled 2011-05-27: qty 1

## 2011-05-27 MED ORDER — OXYCODONE-ACETAMINOPHEN 5-325 MG PO TABS
1.0000 | ORAL_TABLET | ORAL | Status: DC | PRN
  Administered 2011-05-27 (×2): 2 via ORAL
  Administered 2011-05-27: 1 via ORAL
  Filled 2011-05-27 (×2): qty 2
  Filled 2011-05-27: qty 1

## 2011-05-27 NOTE — Addendum Note (Signed)
Addendum  created 05/27/11 1610 by Madison Hickman, CRNA   Modules edited:Notes Section

## 2011-05-27 NOTE — Progress Notes (Signed)
1 Day Post-Op Procedure(s) (LRB): HYSTERECTOMY ABDOMINAL (N/A)  Subjective: Patient reports doing well. She is eager to have foley catheter removed. Tolerated clear liquid diet    Objective: I have reviewed patient's vital signs, intake and output and labs.  General: alert, cooperative and no distress Resp: clear to auscultation bilaterally Cardio: regular rate and rhythm GI: soft, non-tender; bowel sounds normal; no masses,  no organomegaly and incision: clean, dry and intact Extremities: Homans sign is negative, no sign of DVT and no edema, redness or tenderness in the calves or thighs  Assessment: s/p Procedure(s) (LRB): HYSTERECTOMY ABDOMINAL (N/A): stable  Plan: Advance diet Encourage ambulation Advance to PO medication  LOS: 1 day    Valerie Lee 05/27/2011, 8:17 AM

## 2011-05-27 NOTE — Progress Notes (Signed)
UR Chart review completed.  

## 2011-05-27 NOTE — Anesthesia Postprocedure Evaluation (Signed)
  Anesthesia Post-op Note  Patient: Valerie Lee  Procedure(s) Performed: Procedure(s) (LRB): HYSTERECTOMY ABDOMINAL (N/A)  Patient Location: [facility]  Anesthesia Type: General  Level of Consciousness: awake, alert  and oriented  Airway and Oxygen Therapy: Patient Spontanous Breathing  Post-op Pain: none  Post-op Assessment: Post-op Vital signs reviewed and Patient's Cardiovascular Status Stable  Post-op Vital Signs: Reviewed and stable  Complications: No apparent anesthesia complications

## 2011-05-27 NOTE — Progress Notes (Signed)
Pt vomited moderate amount of greenish liquid. Pt states she does not like the percocet for pain relief. Will notify md.

## 2011-05-28 MED ORDER — HYDROCODONE-ACETAMINOPHEN 5-325 MG PO TABS: 1.0000 | ORAL_TABLET | ORAL | Status: AC | PRN

## 2011-05-28 MED ORDER — DOCUSATE SODIUM 100 MG PO CAPS: 100.0000 mg | ORAL_CAPSULE | Freq: Two times a day (BID) | ORAL | Status: AC

## 2011-05-28 MED ORDER — BISACODYL 10 MG RE SUPP
10.0000 mg | Freq: Once | RECTAL | Status: AC
  Administered 2011-05-28: 10 mg via RECTAL
  Filled 2011-05-28: qty 1

## 2011-05-28 MED ORDER — HYDROCODONE-ACETAMINOPHEN 5-325 MG PO TABS
1.0000 | ORAL_TABLET | ORAL | Status: DC | PRN
  Administered 2011-05-28: 2 via ORAL
  Filled 2011-05-28: qty 2

## 2011-05-28 NOTE — Progress Notes (Signed)
Went to give patient 2 mg of dilaudid PO for unrelieved pain. She stated" can I take it later, I am too tired now". I asked "how is your pain"? She state " none, I am not having any when I lay on my stomach like this, it feels better". Informed pt that med was a one time dose and that when her pain reoccurred we would do trial to see if it worked better than percocet.

## 2011-05-28 NOTE — Progress Notes (Signed)
2 Days Post-Op Procedure(s) (LRB): HYSTERECTOMY ABDOMINAL (N/A)  Subjective: Patient reports no problem. She is tolerating regular diet, voiding, passing some gas and ambulating without difficulty  Objective: I have reviewed patient's vital signs.  General: alert, cooperative and no distress GI: soft, non-tender; bowel sounds normal; no masses,  no organomegaly and incision: clean, dry and intact Extremities: Homans sign is negative, no sign of DVT and no edema, redness or tenderness in the calves or thighs  Assessment: s/p Procedure(s) (LRB): HYSTERECTOMY ABDOMINAL (N/A): stable  Plan: Discharge home  LOS: 2 days    Valerie Lee 05/28/2011, 8:00 AM

## 2011-05-28 NOTE — Discharge Instructions (Signed)
Hysterectomy, Abdominal  °Care After °Please read the instructions below. Refer to these instructions for the next few weeks. These instructions provide you with general information on caring for yourself after surgery. Your caregiver may also give you specific instructions. While your treatment has been planned according to the most current medical practices available, unavoidable problems sometimes happen. If you have any problems or questions after you leave, please call your caregiver. °HOME CARE INSTRUCTIONS  °Healing will take time. You will have discomfort, tenderness, swelling and bruising at the operative site for a couple of weeks. This is normal and will get better as time goes on.  °· Only take over-the-counter or prescription medicines for pain, discomfort or fever as directed by your caregiver.  °· Do not take aspirin. It can cause bleeding.  °· Do not drive when taking pain medication.  °· Follow your caregiver's advice regarding diet, exercise, lifting, driving and general activities.  °· Resume your usual diet as directed and allowed.  °· Get plenty of rest and sleep.  °· Do not douche, use tampons, or have sexual intercourse until your caregiver gives you permission.  °· Change your bandages (dressings) as directed.  °· Take your temperature twice a day. Write it down.  °· Your caregiver may recommend showers instead of baths for a few weeks.  °· Do not drink alcohol until your caregiver gives you permission.  °· If you develop constipation, you may take a mild laxative with your caregiver's permission. Bran foods and drinking fluids helps with constipation problems.  °· Try to have someone home with you for a week or two to help with the household activities.  °· Make sure you and your family understands everything about your operation and recovery.  °· Do not sign any legal documents until you feel normal again.  °· Keep all your follow-up appointments as recommended by your caregiver.  °SEEK  MEDICAL CARE IF:  °· There is swelling, redness or increasing pain in the wound area.  °· Pus is coming from the wound.  °· You notice a bad smell from the wound or surgical dressing.  °· You have pain, redness and swelling from the intravenous site.  °· The wound is breaking open (the edges are not staying together).  °· You feel dizzy or feel like fainting.  °· You develop pain or bleeding when you urinate.  °· You develop diarrhea.  °· You develop nausea and vomiting.  °· You develop abnormal vaginal discharge.  °· You develop a rash.  °· You have any type of abnormal reaction or develop an allergy to your medication.  °· You need stronger pain medication for your pain.  °SEEK IMMEDIATE MEDICAL CARE IF: °· You have a fever.  °· You develop abdominal pain.  °· You develop chest pain.  °· You develop shortness of breath.  °· You pass out.  °· You develop pain, swelling or redness of your leg.  °· You develop heavy vaginal bleeding with or without blood clots.  °Document Released: 10/01/2004 Document Revised: 09/17/2010 Document Reviewed: 12/14/2008 °[employer]® Patient Information ©2012 [employer]. °

## 2011-05-28 NOTE — Discharge Summary (Signed)
Physician Discharge Summary  Patient ID: Valerie Lee MRN: [id number] DOB/AGE: 08/27/1975 35 y.o.  Admit date: 05/26/2011 Discharge date: 05/28/2011  Admission Diagnoses: Fibroid uterus and menorrhagia   Discharge Diagnoses: same, s/p TAH Active Problems:  * No active hospital problems. *    Discharged Condition: good  Hospital Course: Patient admitted on 2/28 for scheduled TAH for definitive management of fibroid uterus and abnormal uterine bleeding. An 18 week size fibroid uterus was removed and normal tubes and ovaries were noted at the time of her laparotomy. Patient had an uncomplicated post-operative period and was found stable for discharge on POD#2.  Consults: None  Significant Diagnostic Studies: labs: pre-op Hg 11--> POD#1 Hg 9.6  Treatments: surgery: TAH  Discharge Exam: Blood pressure 93/55, pulse 66, temperature 98.2 F (36.8 C), temperature source Oral, resp. rate 18, height 5' 0.5" (1.537 m), weight 58.06 kg (128 lb), last menstrual period 04/12/2011, SpO2 97.00%. General appearance: alert, cooperative and no distress Resp: clear to auscultation bilaterally Cardio: regular rate and rhythm GI: soft, non-tender; bowel sounds normal; no masses,  no organomegaly and incision: clean, dry and intact Extremities: Homans sign is negative, no sign of DVT and no edema, redness or tenderness in the calves or thighs  Disposition: 01-Home or Self Care   Medication List  As of 05/28/2011  8:40 AM   TAKE these medications         albuterol 108 (90 BASE) MCG/ACT inhaler   Commonly known as: PROVENTIL HFA;VENTOLIN HFA   Inhale 2 puffs into the lungs every 6 (six) hours as needed. For asthma        diphenhydrAMINE 25 MG tablet   Commonly known as: BENADRYL   Take 25 mg by mouth at bedtime.      docusate sodium 100 MG capsule   Commonly known as: COLACE   Take 1 capsule (100 mg total) by mouth 2 (two) times daily.      HYDROcodone-acetaminophen 5-325 MG per tablet     Commonly known as: NORCO   Take 1-2 tablets by mouth every 4 (four) hours as needed.      lurasidone 80 MG Tabs   Commonly known as: LATUDA   Take 80 mg by mouth at bedtime.      risperiDONE 1 MG tablet   Commonly known as: RISPERDAL   Take 1 mg by mouth 2 (two) times daily.           Follow-up Information    Follow up with [facility]. (An appointment will be made for you in 4 -6 weeks)    Contact information:   [address] [place] [address] [phone]         Signed: Marisa Hufstetler 05/28/2011, 8:40 AM

## 2011-05-28 NOTE — Progress Notes (Signed)
Pt complaining of no pain relief from 2  Percocet given at 2118.Reasseses at 2225. Called provider. CNM will put in order for 2 mg dilaudid PO. Instructed to call back if dilaudid not effective.

## 2011-05-28 NOTE — Progress Notes (Signed)
Pt d/c home with friend via wc, to private car. D/C instructions and prescriptions reviewed with pt. Pt verbalized understanding.

## 2011-07-08 ENCOUNTER — Ambulatory Visit: Payer: Medicaid Other | Admitting: Obstetrics and Gynecology

## 2011-11-09 ENCOUNTER — Emergency Department (HOSPITAL_COMMUNITY)
Admission: EM | Admit: 2011-11-09 | Discharge: 2011-11-09 | Disposition: A | Discharge status: Home / Self Care | Payer: Medicaid Other | Attending: Emergency Medicine | Admitting: Emergency Medicine

## 2011-11-09 ENCOUNTER — Encounter (HOSPITAL_COMMUNITY): Payer: Self-pay | Admitting: *Deleted

## 2011-11-09 DIAGNOSIS — F17200 Nicotine dependence, unspecified, uncomplicated: Secondary | ICD-10-CM | POA: Insufficient documentation

## 2011-11-09 DIAGNOSIS — R42 Dizziness and giddiness: Secondary | ICD-10-CM | POA: Insufficient documentation

## 2011-11-09 DIAGNOSIS — D551 Anemia due to other disorders of glutathione metabolism: Secondary | ICD-10-CM | POA: Insufficient documentation

## 2011-11-09 DIAGNOSIS — F209 Schizophrenia, unspecified: Secondary | ICD-10-CM | POA: Insufficient documentation

## 2011-11-09 DIAGNOSIS — R51 Headache: Secondary | ICD-10-CM

## 2011-11-09 DIAGNOSIS — D649 Anemia, unspecified: Secondary | ICD-10-CM | POA: Insufficient documentation

## 2011-11-09 LAB — CBC
HCT: 40 % (ref 36.0–46.0)
Hemoglobin: 13.2 g/dL (ref 12.0–15.0)
RBC: 4.14 MIL/uL (ref 3.87–5.11)

## 2011-11-09 LAB — BASIC METABOLIC PANEL
BUN: 10 mg/dL (ref 6–23)
CO2: 24 mEq/L (ref 19–32)
Chloride: 104 mEq/L (ref 96–112)
Glucose, Bld: 92 mg/dL (ref 70–99)
Potassium: 3.4 mEq/L — ABNORMAL LOW (ref 3.5–5.1)
Sodium: 139 mEq/L (ref 135–145)

## 2011-11-09 MED ORDER — KETOROLAC TROMETHAMINE 30 MG/ML IJ SOLN
30.0000 mg | Freq: Once | INTRAMUSCULAR | Status: AC
  Administered 2011-11-09: 30 mg via INTRAVENOUS
  Filled 2011-11-09: qty 1

## 2011-11-09 MED ORDER — DIAZEPAM 5 MG PO TABS
5.0000 mg | ORAL_TABLET | Freq: Once | ORAL | Status: AC
Start: 2011-11-09 — End: 2011-11-09
  Administered 2011-11-09: 5 mg via ORAL
  Filled 2011-11-09: qty 1

## 2011-11-09 MED ORDER — LORAZEPAM 2 MG/ML IJ SOLN
1.0000 mg | Freq: Once | INTRAMUSCULAR | Status: AC
Start: 2011-11-09 — End: 2011-11-09
  Administered 2011-11-09: 1 mg via INTRAVENOUS
  Filled 2011-11-09: qty 1

## 2011-11-09 MED ORDER — METOCLOPRAMIDE HCL 5 MG/ML IJ SOLN
10.0000 mg | Freq: Once | INTRAMUSCULAR | Status: AC
  Administered 2011-11-09: 10 mg via INTRAVENOUS
  Filled 2011-11-09: qty 2

## 2011-11-09 MED ORDER — MECLIZINE HCL 25 MG PO TABS: 25.0000 mg | ORAL_TABLET | Freq: Four times a day (QID) | ORAL | Status: AC

## 2011-11-09 MED ORDER — HYDROMORPHONE HCL PF 1 MG/ML IJ SOLN
1.0000 mg | Freq: Once | INTRAMUSCULAR | Status: AC
  Administered 2011-11-09: 1 mg via INTRAVENOUS
  Filled 2011-11-09: qty 1

## 2011-11-09 NOTE — ED Notes (Addendum)
Pt reports having dizziness and headaches for three days accompanied with blurry vision, seeing spots. Pt reports having similar symptoms before then getting a blood transfusion as a result. Pt has hx of anemia. Pt reports shortness of breath. Upon assessment respirations are equal and unlabored. Skin is warm and dry. Pt denies chest pain. Pt reports sharp pain in head, neck, shoulder, back and feet.

## 2011-11-09 NOTE — ED Provider Notes (Signed)
History     CSN: [id number]  Arrival date & time 11/09/11  0235   First MD Initiated Contact with Patient 11/09/11 0320      Chief Complaint  Patient presents with  . Dizziness  . Headache    (Consider location/radiation/quality/duration/timing/severity/associated sxs/prior treatment) The history is provided by the patient.   the patient reports 2 days if generalized posterior headache.  She has photophobia without phonophobia.  She reports a history of migraine headaches and reports this feels similar to that.  She also reports new vertiginous symptoms with a sensation that the room is spinning around and around when she opens her eyes.  Her symptoms are worsened when she moves her head rapidly.  She denies weakness in her upper lower extremities.  She's had no change in her vision.  She denies recent trauma to her head.  Past Medical History  Diagnosis Date  . G6PD (glucose 6 phosphatase deficiency)   . Anemia   . G6PD deficiency anemia   . Blood transfusion   . Asthma     rarely uses albuterol rescue  . Mental disorder   . Schizophrenia   . Schizophrenia     Past Surgical History  Procedure Date  . Tubal ligation   . Cholecystectomy 2011  . Abdominal hysterectomy 05/26/2011    Procedure: HYSTERECTOMY ABDOMINAL;  Surgeon: Catalina Antigua, MD;  Location: [facility];  Service: Gynecology;  Laterality: N/A;  . Tubal ligation     History reviewed. No pertinent family history.  History  Substance Use Topics  . Smoking status: Current Everyday Smoker -- 0.2 packs/day for 15 years    Types: Cigarettes  . Smokeless tobacco: Never Used  . Alcohol Use: No    OB History    Grav Para Term Preterm Abortions TAB SAB Ect Mult Living   6 6 5 1  0 0 0 0 0 6      Review of Systems  Neurological: Positive for headaches.  All other systems reviewed and are negative.    Allergies  Bactrim; Codeine; Penicillins; Aspirin; and Ibuprofen  Home Medications   Current Outpatient Rx   Name Route Sig Dispense Refill  . ALBUTEROL SULFATE HFA 108 (90 BASE) MCG/ACT IN AERS Inhalation Inhale 2 puffs into the lungs every 6 (six) hours as needed. For asthma     . DIPHENHYDRAMINE HCL 25 MG PO TABS Oral Take 25 mg by mouth at bedtime.     Marland Kitchen LURASIDONE HCL 80 MG PO TABS Oral Take 80 mg by mouth at bedtime.     Marland Kitchen RISPERIDONE 1 MG PO TABS Oral Take 1 mg by mouth 2 (two) times daily.    Marland Kitchen MECLIZINE HCL 25 MG PO TABS Oral Take 1 tablet (25 mg total) by mouth 4 (four) times daily. 28 tablet 0    BP 113/62  Pulse 59  Temp 98.2 F (36.8 C) (Oral)  Resp 16  SpO2 98%  LMP 04/12/2011  Physical Exam  Nursing note and vitals reviewed. Constitutional: She is oriented to person, place, and time. She appears well-developed and well-nourished. No distress.  HENT:  Head: Normocephalic and atraumatic.  Eyes: EOM are normal. Pupils are equal, round, and reactive to light.  Neck: Normal range of motion.  Cardiovascular: Normal rate, regular rhythm and normal heart sounds.   Pulmonary/Chest: Effort normal and breath sounds normal.  Abdominal: Soft. She exhibits no distension. There is no tenderness.  Musculoskeletal: Normal range of motion.  Neurological: She is alert and oriented to  person, place, and time.       5/5 strength in major muscle groups of  bilateral upper and lower extremities. Speech normal. No facial asymetry.   Skin: Skin is warm and dry.  Psychiatric: She has a normal mood and affect. Judgment normal.    ED Course  Procedures (including critical care time)  Labs Reviewed  BASIC METABOLIC PANEL - Abnormal; Notable for the following:    Potassium 3.4 (*)     All other components within normal limits  CBC   No results found.   1. Headache   2. Vertigo       MDM  5:49 AM The patient feels much better at this time.  Sober symptoms unlike peripheral vertigo and other sound like comfort and migraine.  Regardless she feels much better at this time.  She has a  normal neurologic exam.  There is no indication for head CT additional imaging at this time.        Lyanne Co, MD 11/09/11 [phone]

## 2012-01-01 IMAGING — CT CT ABD-PELV W/ CM
1 of 2 series · 15 of 32 positions shown, 19 images · IV contrast (agent unspecified)
Comparison: CT abdomen and pelvis 06/12/2010 and 03/28/2010.

CLINICAL DATA: Diffuse abdominal pain.  Nausea and vomiting.

CT ABDOMEN AND PELVIS WITH CONTRAST 07/14/2010:
TECHNIQUE: Multidetector CT imaging of the abdomen and pelvis was
performed following the standard protocol during bolus
administration of intravenous contrast.
Contrast: 100 ml Tmnipaque-XJJ IV.  Oral contrast also
administered.

[Series 2: rtn ap with st · axial · 0.60mm/px · z∈[-426,-30]mm · 15 of 87 slices shown, 19 images]
[im 4/87  soft-tissue]
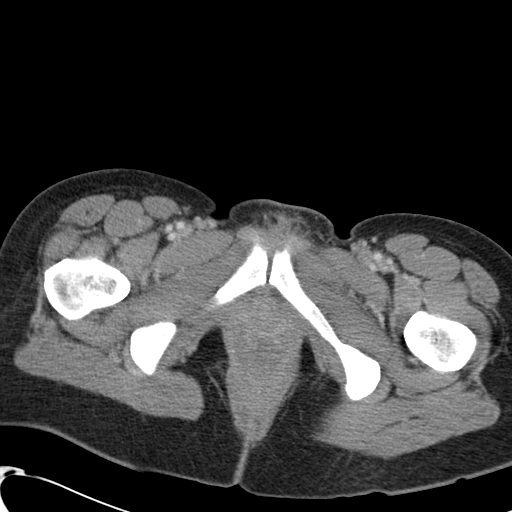
[im 4/87  bone]
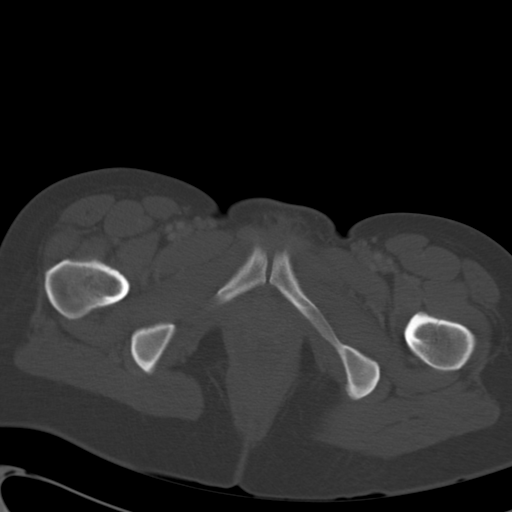
[im 11/87  soft-tissue]
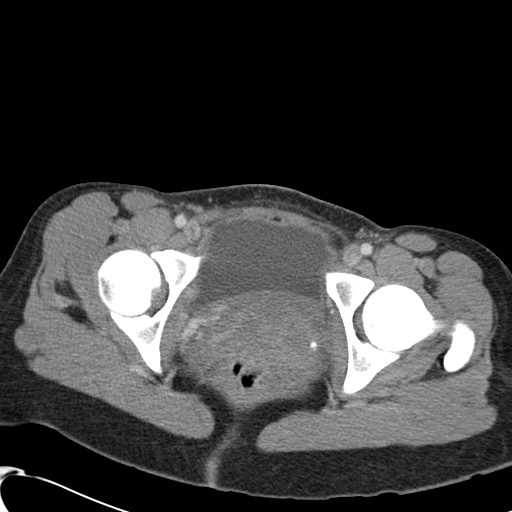
[im 18/87  soft-tissue]
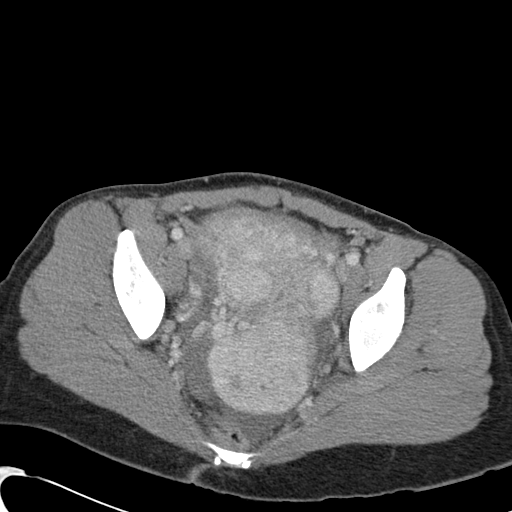
[im 26/87  soft-tissue]
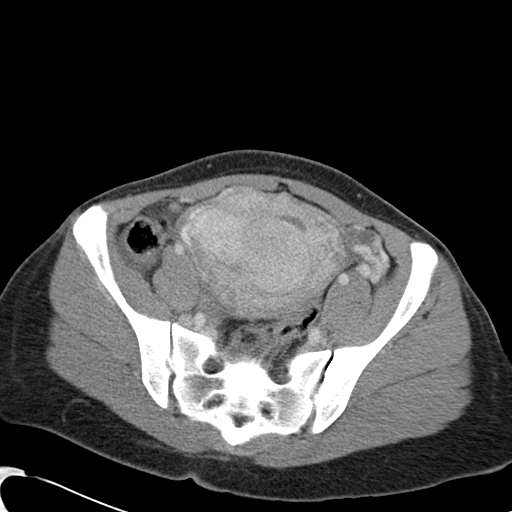
[im 29/87  soft-tissue]
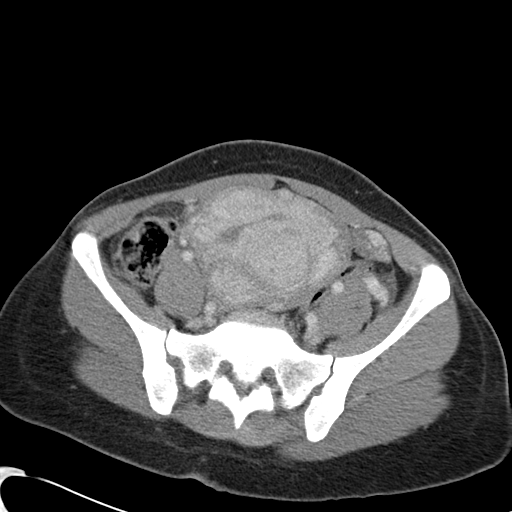
[im 36/87  soft-tissue]
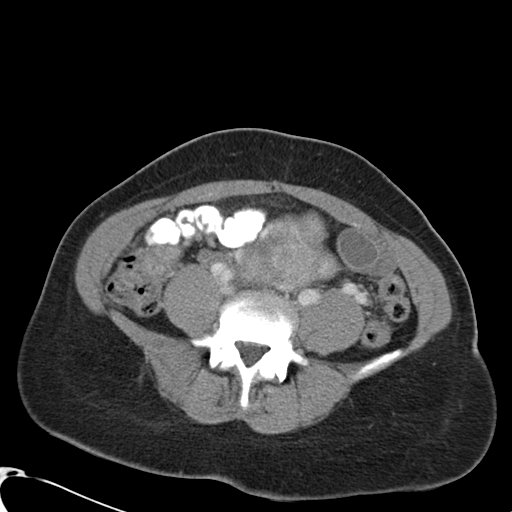
[im 44/87  soft-tissue]
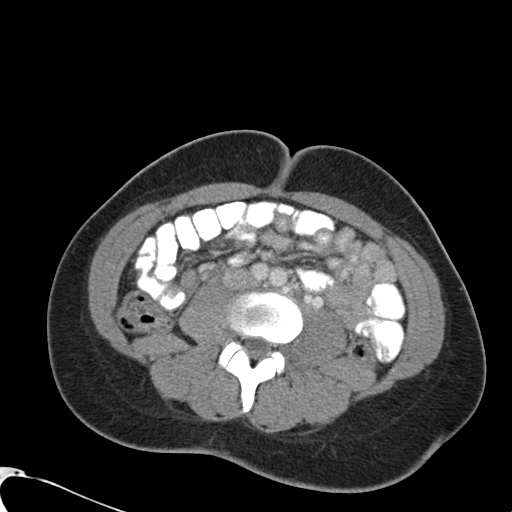
[im 51/87  soft-tissue]
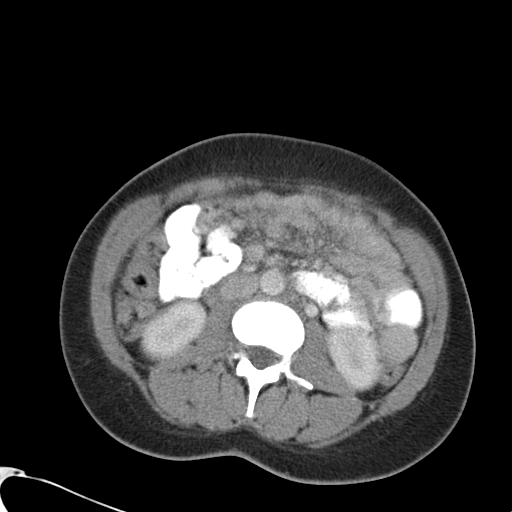
[im 58/87  soft-tissue]
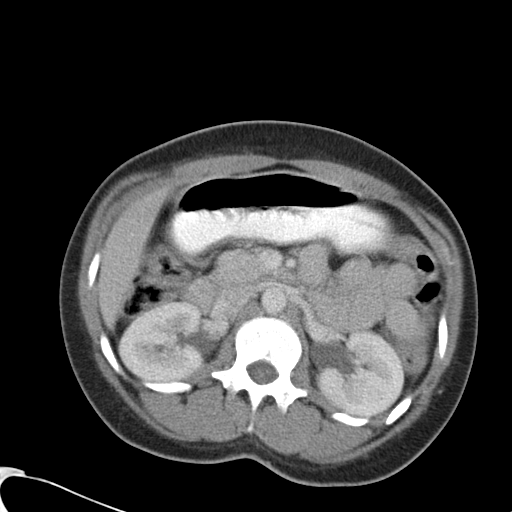
[im 58/87  bone]
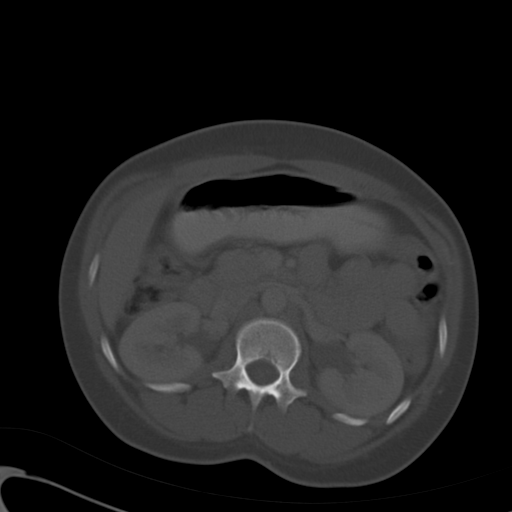
[im 61/87  soft-tissue]
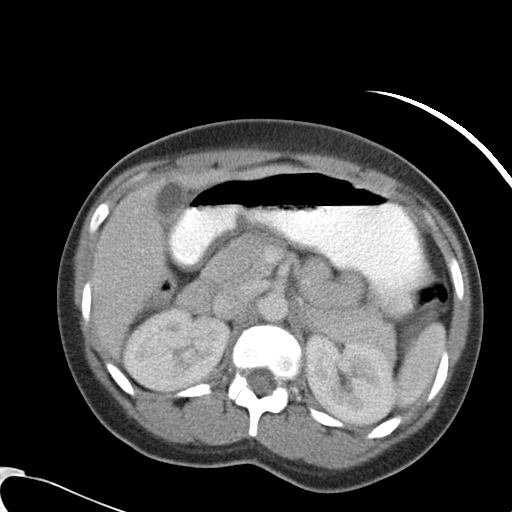
[im 69/87  soft-tissue]
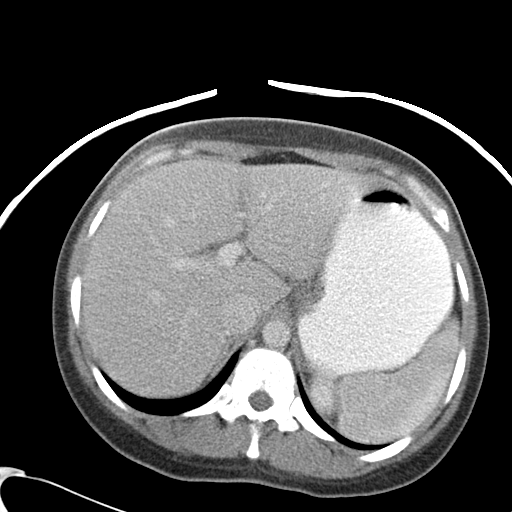
[im 72/87  lung]
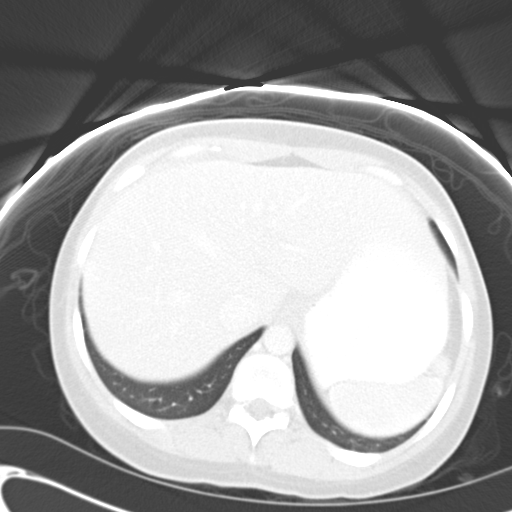
[im 76/87  soft-tissue]
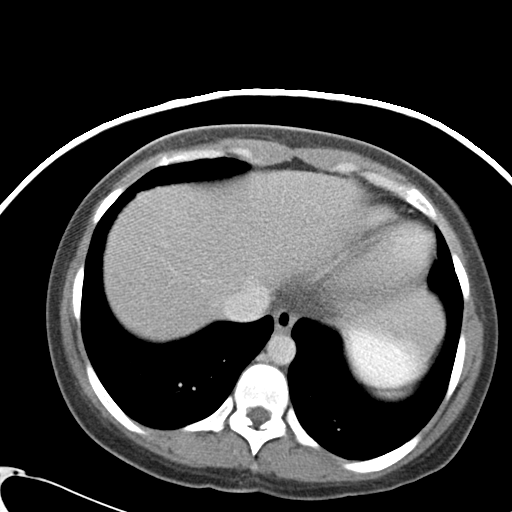
[im 76/87  lung]
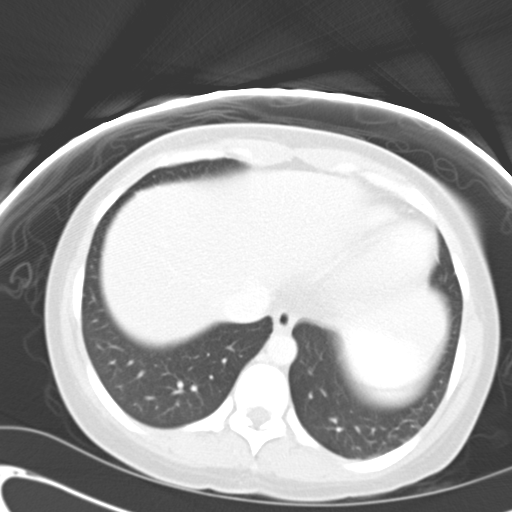
[im 79/87  lung]
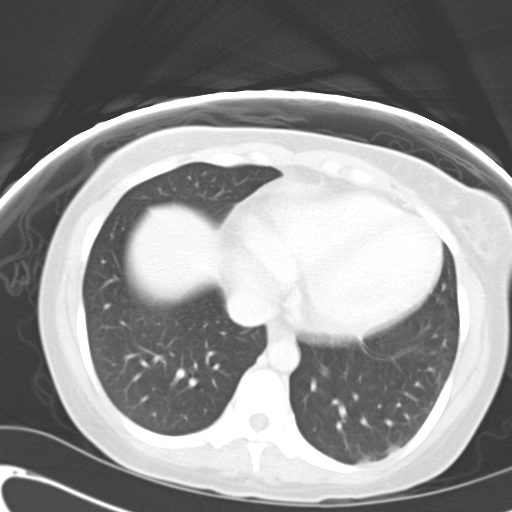
[im 83/87  soft-tissue]
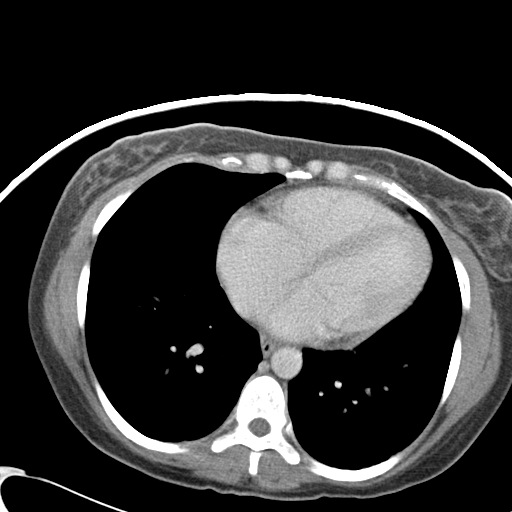
[im 83/87  lung]
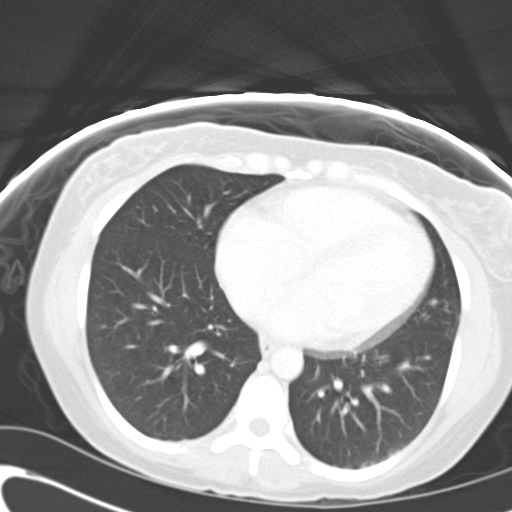

[15 of 32 positions shown; findings below may reference images not displayed]

FINDINGS: Calcified gallstones within the contracted gallbladder,
the largest approximating 1.3 cm, unchanged.  Borderline
gallbladder wall thickening without pericholecystic fluid
currently.  No biliary ductal dilation.  Normal appearing liver,
spleen, pancreas, adrenal glands, and kidneys.  No visible aorto-
iliofemoral atherosclerosis.  Patent visceral arteries.  No
significant lymphadenopathy.

Stomach normal by CT.  Normal appearing small bowel.  Moderate
stool throughout normal appearing colon.  No ascites.

Markedly enlarged uterus containing innumerable fibroids as noted
previously, the largest fibroid arising from the lower uterine
segment approximating 6 cm.  Prominent parametrial vessels.  Right
ovarian cyst approximating 4.0 x 1.9 cm.  Small amount of free
fluid in the cul-de-sac.  Urinary bladder unremarkable.  Pelvic
phleboliths.  Bone window images unremarkable.  Visualized lung
bases clear.  Heart size upper normal.
IMPRESSION: 1.  Cholelithiasis.  Contracted gallbladder.  No CT evidence for
acute cholecystitis.
2.  Markedly enlarged uterus containing innumerable fibroids.
3.  Approximate 4 x 2 cm right ovarian cyst.  Small amount of free
fluid in the cul-de-sac, query recent cyst rupture.

## 2012-01-26 ENCOUNTER — Encounter (HOSPITAL_COMMUNITY): Payer: Self-pay | Admitting: Emergency Medicine

## 2012-01-26 ENCOUNTER — Emergency Department (HOSPITAL_COMMUNITY)
Admission: EM | Admit: 2012-01-26 | Discharge: 2012-01-26 | Disposition: A | Discharge status: Home / Self Care | Payer: Medicaid Other | Attending: Emergency Medicine | Admitting: Emergency Medicine

## 2012-01-26 DIAGNOSIS — Z8614 Personal history of Methicillin resistant Staphylococcus aureus infection: Secondary | ICD-10-CM | POA: Insufficient documentation

## 2012-01-26 DIAGNOSIS — Z79899 Other long term (current) drug therapy: Secondary | ICD-10-CM | POA: Insufficient documentation

## 2012-01-26 DIAGNOSIS — D649 Anemia, unspecified: Secondary | ICD-10-CM | POA: Insufficient documentation

## 2012-01-26 DIAGNOSIS — D551 Anemia due to other disorders of glutathione metabolism: Secondary | ICD-10-CM | POA: Insufficient documentation

## 2012-01-26 DIAGNOSIS — IMO0002 Reserved for concepts with insufficient information to code with codable children: Secondary | ICD-10-CM | POA: Insufficient documentation

## 2012-01-26 DIAGNOSIS — L0291 Cutaneous abscess, unspecified: Secondary | ICD-10-CM

## 2012-01-26 DIAGNOSIS — F209 Schizophrenia, unspecified: Secondary | ICD-10-CM | POA: Insufficient documentation

## 2012-01-26 DIAGNOSIS — F17200 Nicotine dependence, unspecified, uncomplicated: Secondary | ICD-10-CM | POA: Insufficient documentation

## 2012-01-26 DIAGNOSIS — J45909 Unspecified asthma, uncomplicated: Secondary | ICD-10-CM | POA: Insufficient documentation

## 2012-01-26 MED ORDER — DIPHENHYDRAMINE HCL 50 MG/ML IJ SOLN: 25.0000 mg | Freq: Once | INTRAMUSCULAR | Status: DC

## 2012-01-26 MED ORDER — HYDROCODONE-ACETAMINOPHEN 5-325 MG PO TABS: 2.0000 | ORAL_TABLET | Freq: Four times a day (QID) | ORAL | Status: DC | PRN

## 2012-01-26 MED ORDER — HYDROMORPHONE HCL PF 1 MG/ML IJ SOLN: 1.0000 mg | Freq: Once | INTRAMUSCULAR | Status: DC

## 2012-01-26 NOTE — ED Provider Notes (Signed)
History     CSN: [id number]  Arrival date & time 01/26/12  1144   First MD Initiated Contact with Patient 01/26/12 1211      Chief Complaint  Patient presents with  . Abscess    swelling under r/axilla x 7 days    (Consider location/radiation/quality/duration/timing/severity/associated sxs/prior treatment) HPI Comments: Patient presents with an abscess of her right axilla.  She has never had an abscess before.  No known history of MRSA or DM.    Patient is a 36 y.o. female presenting with abscess. The history is provided by the patient.  Abscess  This is a new problem. Episode onset: one week. The onset was gradual. The problem has been gradually worsening. Affected Location: right axilla. Pertinent negatives include no fever and no vomiting.    Past Medical History  Diagnosis Date  . G6PD (glucose 6 phosphatase deficiency)   . Anemia   . G6PD deficiency anemia   . Blood transfusion   . Asthma     rarely uses albuterol rescue  . Mental disorder   . Schizophrenia   . Schizophrenia     Past Surgical History  Procedure Date  . Tubal ligation   . Cholecystectomy 2011  . Abdominal hysterectomy 05/26/2011    Procedure: HYSTERECTOMY ABDOMINAL;  Surgeon: Catalina Antigua, MD;  Location: [facility];  Service: Gynecology;  Laterality: N/A;  . Tubal ligation     Family History  Problem Relation Age of Onset  . Hypertension Mother     History  Substance Use Topics  . Smoking status: Current Every Day Smoker -- 0.2 packs/day for 15 years    Types: Cigarettes  . Smokeless tobacco: Never Used  . Alcohol Use: No    OB History    Grav Para Term Preterm Abortions TAB SAB Ect Mult Living   6 6 5 1  0 0 0 0 0 6      Review of Systems  Constitutional: Negative for fever and chills.  Gastrointestinal: Negative for vomiting.  Skin:       abscess  All other systems reviewed and are negative.    Allergies  Bactrim; Codeine; Penicillins; Aspirin; and Ibuprofen  Home  Medications   Current Outpatient Rx  Name Route Sig Dispense Refill  . ALBUTEROL SULFATE HFA 108 (90 BASE) MCG/ACT IN AERS Inhalation Inhale 2 puffs into the lungs every 6 (six) hours as needed. For asthma     . DIPHENHYDRAMINE HCL 25 MG PO TABS Oral Take 25 mg by mouth at bedtime.     Marland Kitchen LURASIDONE HCL 80 MG PO TABS Oral Take 80 mg by mouth at bedtime.     . MULTI-VITAMIN/MINERALS PO TABS Oral Take 1 tablet by mouth daily.    Marland Kitchen RISPERIDONE 1 MG PO TABS Oral Take 1 mg by mouth 2 (two) times daily.      BP 97/56  Pulse 84  Temp 98.7 F (37.1 C) (Oral)  Resp 16  SpO2 99%  LMP 04/12/2011  Physical Exam  Nursing note and vitals reviewed. Constitutional: She appears well-developed and well-nourished. No distress.  HENT:  Head: Normocephalic and atraumatic.  Mouth/Throat: Oropharynx is clear and moist.  Cardiovascular: Normal rate, regular rhythm and normal heart sounds.   Pulmonary/Chest: Effort normal and breath sounds normal.  Neurological: She is alert.  Skin: She is not diaphoretic.       Approximately 3 cm abscess of the right axilla without surrounding erythema, induration, or warmth.  Psychiatric: She has a normal mood and  affect.    ED Course  Procedures (including critical care time)  Labs Reviewed - No data to display No results found.   No diagnosis found.  INCISION AND DRAINAGE Performed by: Anne Shutter, Angla Delahunt Consent: Verbal consent obtained. Risks and benefits: risks, benefits and alternatives were discussed Type: abscess  Body area: right axilla  Anesthesia: local infiltration  Local anesthetic: lidocaine 2% with epinephrine  Anesthetic total: 4 ml  Complexity: complex Blunt dissection to break up loculations  Drainage: purulent  Drainage amount: moderate  Patient tolerance: Patient tolerated the procedure well with no immediate complications.     MDM  Patient with skin abscess amenable to incision and drainage.  Abscess was not large  enough to warrant packing or drain,  wound recheck in 2 days. Encouraged home warm soaks.  No signs of cellulitis in surrounding skin.  Will d/c to home.  No antibiotic therapy is indicated.        Pascal Lux [place], PA-C 01/26/12 [phone]

## 2012-01-26 NOTE — ED Notes (Signed)
Pt reports swelling and tenderness  Under r/axilla x 1 week

## 2012-01-27 NOTE — ED Provider Notes (Signed)
Medical screening examination/treatment/procedure(s) were performed by non-physician practitioner and as supervising physician I was immediately available for consultation/collaboration.  Flint Melter, MD 01/27/12 [phone]

## 2012-02-29 ENCOUNTER — Emergency Department (HOSPITAL_COMMUNITY)
Admission: EM | Admit: 2012-02-29 | Discharge: 2012-02-29 | Disposition: A | Discharge status: Home / Self Care | Payer: Medicaid Other | Attending: Emergency Medicine | Admitting: Emergency Medicine

## 2012-02-29 ENCOUNTER — Encounter (HOSPITAL_COMMUNITY): Payer: Self-pay

## 2012-02-29 DIAGNOSIS — R112 Nausea with vomiting, unspecified: Secondary | ICD-10-CM | POA: Insufficient documentation

## 2012-02-29 DIAGNOSIS — B349 Viral infection, unspecified: Secondary | ICD-10-CM

## 2012-02-29 DIAGNOSIS — R52 Pain, unspecified: Secondary | ICD-10-CM | POA: Insufficient documentation

## 2012-02-29 DIAGNOSIS — B9789 Other viral agents as the cause of diseases classified elsewhere: Secondary | ICD-10-CM | POA: Insufficient documentation

## 2012-02-29 DIAGNOSIS — F209 Schizophrenia, unspecified: Secondary | ICD-10-CM | POA: Insufficient documentation

## 2012-02-29 DIAGNOSIS — R5381 Other malaise: Secondary | ICD-10-CM | POA: Insufficient documentation

## 2012-02-29 DIAGNOSIS — Z5189 Encounter for other specified aftercare: Secondary | ICD-10-CM | POA: Insufficient documentation

## 2012-02-29 DIAGNOSIS — Z79899 Other long term (current) drug therapy: Secondary | ICD-10-CM | POA: Insufficient documentation

## 2012-02-29 DIAGNOSIS — J45909 Unspecified asthma, uncomplicated: Secondary | ICD-10-CM | POA: Insufficient documentation

## 2012-02-29 DIAGNOSIS — F17200 Nicotine dependence, unspecified, uncomplicated: Secondary | ICD-10-CM | POA: Insufficient documentation

## 2012-02-29 DIAGNOSIS — Z8639 Personal history of other endocrine, nutritional and metabolic disease: Secondary | ICD-10-CM | POA: Insufficient documentation

## 2012-02-29 DIAGNOSIS — R42 Dizziness and giddiness: Secondary | ICD-10-CM | POA: Insufficient documentation

## 2012-02-29 DIAGNOSIS — Z862 Personal history of diseases of the blood and blood-forming organs and certain disorders involving the immune mechanism: Secondary | ICD-10-CM | POA: Insufficient documentation

## 2012-02-29 DIAGNOSIS — F489 Nonpsychotic mental disorder, unspecified: Secondary | ICD-10-CM | POA: Insufficient documentation

## 2012-02-29 LAB — URINALYSIS, ROUTINE W REFLEX MICROSCOPIC
Bilirubin Urine: NEGATIVE
Ketones, ur: NEGATIVE mg/dL
Nitrite: NEGATIVE
Specific Gravity, Urine: 1.027 (ref 1.005–1.030)
Urobilinogen, UA: 1 mg/dL (ref 0.0–1.0)

## 2012-02-29 LAB — COMPREHENSIVE METABOLIC PANEL
ALT: 8 U/L (ref 0–35)
BUN: 10 mg/dL (ref 6–23)
CO2: 28 mEq/L (ref 19–32)
Calcium: 9.4 mg/dL (ref 8.4–10.5)
GFR calc Af Amer: >90 mL/min (ref >90)
GFR calc non Af Amer: >90 mL/min (ref >90)
Glucose, Bld: 92 mg/dL (ref 70–99)
Sodium: 140 mEq/L (ref 135–145)

## 2012-02-29 LAB — CBC WITH DIFFERENTIAL/PLATELET
Eosinophils Relative: 2 % (ref 0–5)
HCT: 34 % — ABNORMAL LOW (ref 36.0–46.0)
Hemoglobin: 11.4 g/dL — ABNORMAL LOW (ref 12.0–15.0)
Lymphocytes Relative: 28 % (ref 12–46)
Lymphs Abs: 3.2 10*3/uL (ref 0.7–4.0)
MCH: 32.5 pg (ref 26.0–34.0)
MCV: 96.9 fL (ref 78.0–100.0)
Monocytes Absolute: 0.9 10*3/uL (ref 0.1–1.0)
Monocytes Relative: 8 % (ref 3–12)
Platelets: 302 10*3/uL (ref 150–400)
RBC: 3.51 MIL/uL — ABNORMAL LOW (ref 3.87–5.11)
WBC: 11.7 10*3/uL — ABNORMAL HIGH (ref 4.0–10.5)

## 2012-02-29 MED ORDER — MORPHINE SULFATE 4 MG/ML IJ SOLN
4.0000 mg | Freq: Once | INTRAMUSCULAR | Status: AC
  Administered 2012-02-29: 4 mg via INTRAVENOUS
  Filled 2012-02-29: qty 1

## 2012-02-29 MED ORDER — SODIUM CHLORIDE 0.9 % IV BOLUS (SEPSIS)
1000.0000 mL | Freq: Once | INTRAVENOUS | Status: AC
  Administered 2012-02-29: 1000 mL via INTRAVENOUS

## 2012-02-29 NOTE — ED Notes (Addendum)
Patient reports that she has been having a headache, body aches, sore throat, and dizziness x 2 days. Patient denies N/V, diarrhea, or fever.

## 2012-02-29 NOTE — ED Provider Notes (Signed)
History     CSN: [id number]  Arrival date & time 02/29/12  1217   First MD Initiated Contact with Patient 02/29/12 1608      Chief Complaint  Patient presents with  . Headache  . Dizziness  . Generalized Body Aches    (Consider location/radiation/quality/duration/timing/severity/associated sxs/prior treatment) HPI Comments: Patient with a 2 day history of body aches, dizziness, headache, sore throat.  She denies fevers or chills.  No n/v/d.    Patient is a 36 y.o. female presenting with headaches. The history is provided by the patient.  Headache  This is a new problem. The current episode started 2 days ago. The problem occurs constantly. The problem has been gradually worsening. The pain is moderate. The pain does not radiate. Associated symptoms include malaise/fatigue and nausea. Pertinent negatives include no fever and no vomiting. She has tried nothing for the symptoms.    Past Medical History  Diagnosis Date  . G6PD (glucose 6 phosphatase deficiency)   . Anemia   . G6PD deficiency anemia   . Blood transfusion   . Asthma     rarely uses albuterol rescue  . Mental disorder   . Schizophrenia   . Schizophrenia     Past Surgical History  Procedure Date  . Tubal ligation   . Cholecystectomy 2011  . Abdominal hysterectomy 05/26/2011    Procedure: HYSTERECTOMY ABDOMINAL;  Surgeon: Catalina Antigua, MD;  Location: [facility];  Service: Gynecology;  Laterality: N/A;  . Tubal ligation     Family History  Problem Relation Age of Onset  . Hypertension Mother     History  Substance Use Topics  . Smoking status: Current Every Day Smoker -- 0.2 packs/day for 15 years    Types: Cigarettes  . Smokeless tobacco: Never Used  . Alcohol Use: No    OB History    Grav Para Term Preterm Abortions TAB SAB Ect Mult Living   6 6 5 1  0 0 0 0 0 6      Review of Systems  Constitutional: Positive for malaise/fatigue. Negative for fever.  Gastrointestinal: Positive for nausea.  Negative for vomiting.  Neurological: Positive for headaches.  All other systems reviewed and are negative.    Allergies  Bactrim; Codeine; Penicillins; Aspirin; and Ibuprofen  Home Medications   Current Outpatient Rx  Name  Route  Sig  Dispense  Refill  . ALBUTEROL SULFATE HFA 108 (90 BASE) MCG/ACT IN AERS   Inhalation   Inhale 2 puffs into the lungs every 6 (six) hours as needed. For asthma          . DIPHENHYDRAMINE HCL 25 MG PO TABS   Oral   Take 25 mg by mouth at bedtime.          Marland Kitchen HYDROCODONE-ACETAMINOPHEN 5-325 MG PO TABS   Oral   Take 2 tablets by mouth every 6 (six) hours as needed for pain.   10 tablet   0   . LURASIDONE HCL 80 MG PO TABS   Oral   Take 80 mg by mouth at bedtime.          . MULTI-VITAMIN/MINERALS PO TABS   Oral   Take 1 tablet by mouth daily.         Marland Kitchen PRESCRIPTION MEDICATION      Pt takes an sleeping pill. Cant confirm with pt's pharmacy         . RISPERIDONE 1 MG PO TABS   Oral   Take 1 mg by  mouth 2 (two) times daily.           BP 96/63  Pulse 89  Temp 98.2 F (36.8 C) (Oral)  Resp 16  SpO2 97%  LMP 04/12/2011  Physical Exam  Nursing note and vitals reviewed. Constitutional: She is oriented to person, place, and time. She appears well-developed and well-nourished. No distress.  HENT:  Head: Normocephalic and atraumatic.       PO is erythematous but no exudates.    Eyes: EOM are normal. Pupils are equal, round, and reactive to light.  Neck: Normal range of motion. Neck supple.  Cardiovascular: Normal rate and regular rhythm.  Exam reveals no gallop and no friction rub.   No murmur heard. Pulmonary/Chest: Effort normal and breath sounds normal. No respiratory distress. She has no wheezes.  Abdominal: Soft. Bowel sounds are normal. She exhibits no distension. There is no tenderness.  Musculoskeletal: Normal range of motion.  Neurological: She is alert and oriented to person, place, and time. No cranial nerve  deficit. She exhibits normal muscle tone. Coordination normal.  Skin: Skin is warm and dry. She is not diaphoretic.    ED Course  Procedures (including critical care time)   Labs Reviewed  RAPID STREP SCREEN  CBC WITH DIFFERENTIAL  COMPREHENSIVE METABOLIC PANEL  URINALYSIS, ROUTINE W REFLEX MICROSCOPIC   No results found.   No diagnosis found.    MDM  The patient presents with generalized body aches, headache, and symptoms that sound viral in nature.  She was hydrated and is feeling better.          Geoffery Lyons, MD 02/29/12 [phone]

## 2012-03-06 ENCOUNTER — Encounter (HOSPITAL_COMMUNITY): Payer: Self-pay | Admitting: Emergency Medicine

## 2012-03-06 ENCOUNTER — Emergency Department (HOSPITAL_COMMUNITY)
Admission: EM | Admit: 2012-03-06 | Discharge: 2012-03-06 | Disposition: A | Discharge status: Home / Self Care | Payer: Medicaid Other | Attending: Emergency Medicine | Admitting: Emergency Medicine

## 2012-03-06 ENCOUNTER — Emergency Department (HOSPITAL_COMMUNITY): Payer: Medicaid Other

## 2012-03-06 DIAGNOSIS — R109 Unspecified abdominal pain: Secondary | ICD-10-CM | POA: Insufficient documentation

## 2012-03-06 DIAGNOSIS — M79609 Pain in unspecified limb: Secondary | ICD-10-CM

## 2012-03-06 DIAGNOSIS — M79606 Pain in leg, unspecified: Secondary | ICD-10-CM

## 2012-03-06 DIAGNOSIS — Z862 Personal history of diseases of the blood and blood-forming organs and certain disorders involving the immune mechanism: Secondary | ICD-10-CM | POA: Insufficient documentation

## 2012-03-06 DIAGNOSIS — IMO0001 Reserved for inherently not codable concepts without codable children: Secondary | ICD-10-CM | POA: Insufficient documentation

## 2012-03-06 DIAGNOSIS — Z8659 Personal history of other mental and behavioral disorders: Secondary | ICD-10-CM | POA: Insufficient documentation

## 2012-03-06 DIAGNOSIS — F209 Schizophrenia, unspecified: Secondary | ICD-10-CM | POA: Insufficient documentation

## 2012-03-06 DIAGNOSIS — J45909 Unspecified asthma, uncomplicated: Secondary | ICD-10-CM | POA: Insufficient documentation

## 2012-03-06 DIAGNOSIS — M2550 Pain in unspecified joint: Secondary | ICD-10-CM | POA: Insufficient documentation

## 2012-03-06 DIAGNOSIS — Z79899 Other long term (current) drug therapy: Secondary | ICD-10-CM | POA: Insufficient documentation

## 2012-03-06 DIAGNOSIS — F17200 Nicotine dependence, unspecified, uncomplicated: Secondary | ICD-10-CM | POA: Insufficient documentation

## 2012-03-06 DIAGNOSIS — M25559 Pain in unspecified hip: Secondary | ICD-10-CM | POA: Insufficient documentation

## 2012-03-06 LAB — CBC WITH DIFFERENTIAL/PLATELET
Basophils Relative: 0 % (ref 0–1)
Eosinophils Absolute: 0.2 10*3/uL (ref 0.0–0.7)
MCH: 33 pg (ref 26.0–34.0)
MCHC: 34.4 g/dL (ref 30.0–36.0)
Neutrophils Relative %: 76 % (ref 43–77)
Platelets: 341 10*3/uL (ref 150–400)
RBC: 4.03 MIL/uL (ref 3.87–5.11)

## 2012-03-06 LAB — BASIC METABOLIC PANEL
BUN: 11 mg/dL (ref 6–23)
Creatinine, Ser: 0.61 mg/dL (ref 0.50–1.10)
GFR calc Af Amer: >90 mL/min (ref >90)
GFR calc non Af Amer: >90 mL/min (ref >90)

## 2012-03-06 MED ORDER — OXYCODONE-ACETAMINOPHEN 5-325 MG PO TABS: 2.0000 | ORAL_TABLET | ORAL | Status: DC | PRN

## 2012-03-06 MED ORDER — IOHEXOL 300 MG/ML  SOLN
80.0000 mL | Freq: Once | INTRAMUSCULAR | Status: AC | PRN
  Administered 2012-03-06: 80 mL via INTRAVENOUS

## 2012-03-06 MED ORDER — MORPHINE SULFATE 4 MG/ML IJ SOLN
4.0000 mg | Freq: Once | INTRAMUSCULAR | Status: AC
  Administered 2012-03-06: 4 mg via INTRAVENOUS
  Filled 2012-03-06: qty 1

## 2012-03-06 MED ORDER — MORPHINE SULFATE 4 MG/ML IJ SOLN
2.0000 mg | Freq: Once | INTRAMUSCULAR | Status: AC
  Administered 2012-03-06: 2 mg via INTRAVENOUS
  Filled 2012-03-06: qty 1

## 2012-03-06 NOTE — ED Notes (Signed)
Patient transported to CT 

## 2012-03-06 NOTE — ED Notes (Signed)
Pt reports that she has a constant hip and leg pain that are in both sides. Pt is tearful and rates pain 10/10. Pt states that it hurts to walk or move them. 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

## 2012-03-06 NOTE — ED Notes (Signed)
Pt complaining of worsening pain. PA aware.

## 2012-03-06 NOTE — ED Provider Notes (Signed)
History     CSN: [id number]  Arrival date & time 03/06/12  4098   First MD Initiated Contact with Patient 03/06/12 1049      Chief Complaint  Patient presents with  . Leg Pain  . Hip Pain    (Consider location/radiation/quality/duration/timing/severity/associated sxs/prior treatment) HPI Comments: Patient is a 36 year old female with a past medical history of schizophrenia and G6PD deficiency who presents with a 1 day history of bilateral hip and leg pain. Patient reports the pain started gradually this morning and progressively worsened since the onset. Pain is described as aching and severe. Patient has not tried anything for symptom relief. The pain is present throughout her entire legs and does not radiate. No associated symptoms. Walking, touching her legs and moving her legs makes the pain worse. Nothing makes the pain better.    Past Medical History  Diagnosis Date  . G6PD (glucose 6 phosphatase deficiency)   . Anemia   . G6PD deficiency anemia   . Blood transfusion   . Asthma     rarely uses albuterol rescue  . Mental disorder   . Schizophrenia   . Schizophrenia     Past Surgical History  Procedure Date  . Tubal ligation   . Cholecystectomy 2011  . Abdominal hysterectomy 05/26/2011    Procedure: HYSTERECTOMY ABDOMINAL;  Surgeon: Catalina Antigua, MD;  Location: [facility];  Service: Gynecology;  Laterality: N/A;  . Tubal ligation     Family History  Problem Relation Age of Onset  . Hypertension Mother     History  Substance Use Topics  . Smoking status: Current Every Day Smoker -- 0.2 packs/day for 15 years    Types: Cigarettes  . Smokeless tobacco: Never Used  . Alcohol Use: No    OB History    Grav Para Term Preterm Abortions TAB SAB Ect Mult Living   6 6 5 1  0 0 0 0 0 6      Review of Systems  Musculoskeletal: Positive for myalgias and arthralgias.  All other systems reviewed and are negative.    Allergies  Bactrim; Codeine; Penicillins;  Aspirin; and Ibuprofen  Home Medications   Current Outpatient Rx  Name  Route  Sig  Dispense  Refill  . ALBUTEROL SULFATE HFA 108 (90 BASE) MCG/ACT IN AERS   Inhalation   Inhale 2 puffs into the lungs every 6 (six) hours as needed. For asthma          . DIPHENHYDRAMINE HCL 25 MG PO TABS   Oral   Take 25 mg by mouth at bedtime.          Marland Kitchen LURASIDONE HCL 80 MG PO TABS   Oral   Take 80 mg by mouth at bedtime.          Marland Kitchen PRESCRIPTION MEDICATION      Pt takes an sleeping pill. Cant confirm with pt's pharmacy         . RISPERIDONE 1 MG PO TABS   Oral   Take 1 mg by mouth 2 (two) times daily.         Marland Kitchen HYDROCODONE-ACETAMINOPHEN 5-325 MG PO TABS   Oral   Take 2 tablets by mouth every 6 (six) hours as needed for pain.   10 tablet   0   . MULTI-VITAMIN/MINERALS PO TABS   Oral   Take 1 tablet by mouth daily.           BP 109/61  Pulse 88  Temp  98.2 F (36.8 C) (Oral)  Resp 18  SpO2 98%  LMP 04/12/2011  Physical Exam  Nursing note and vitals reviewed. Constitutional: She is oriented to person, place, and time. She appears well-developed and well-nourished. No distress.  HENT:  Head: Normocephalic and atraumatic.  Eyes: Conjunctivae normal are normal.  Neck: Normal range of motion. Neck supple.  Cardiovascular: Normal rate and regular rhythm.  Exam reveals no gallop and no friction rub.   No murmur heard.      Lower extremity pulses not palpable or heard with doppler.   Pulmonary/Chest: Effort normal and breath sounds normal. She has no wheezes. She has no rales. She exhibits no tenderness.  Abdominal: Soft. She exhibits no distension. There is tenderness. There is no rebound and no guarding.       RLQ tenderness to palpation. No other focal areas of tenderness.   Musculoskeletal:       Unable to assess ROM of lower extremity due to extreme pain. Tenderness to light touch of bilateral legs.   Neurological: She is alert and oriented to person, place, and  time.       Speech is goal-oriented. Moves limbs without ataxia.   Skin: Skin is warm and dry. She is not diaphoretic.  Psychiatric:       Depressed mood.     ED Course  Procedures (including critical care time)  Labs Reviewed  CBC WITH DIFFERENTIAL - Abnormal; Notable for the following:    WBC 16.0 (*)     Neutro Abs 12.1 (*)     All other components within normal limits  BASIC METABOLIC PANEL  URINALYSIS, ROUTINE W REFLEX MICROSCOPIC   Ct Abdomen Pelvis W Contrast  03/06/2012  *RADIOLOGY REPORT*  Clinical Data: Abdominal pain  CT ABDOMEN AND PELVIS WITH CONTRAST  Technique:  Multidetector CT imaging of the abdomen and pelvis was performed following the standard protocol during bolus administration of intravenous contrast.  Contrast: 80mL OMNIPAQUE IOHEXOL 300 MG/ML  SOLN  Comparison: 04/11/2011.  Findings: Lung bases:  The lung bases appear clear.  No pleural or pericardial effusion.  The heart size appears mildly enlarged.  Abdomen/pelvis:  There is no focal liver abnormalities identified. Previous cholecystectomy.  Mild intrahepatic bile duct dilatation. The common bile duct measures up to 7 mm.  No CT apparent stones or obstructing mass noted.  The pancreatic duct is normal in caliber. Pancreas appears normal.  The spleen is negative.  Normal appearance of both adrenal glands.  The kidneys are both unremarkable.  The kidneys both appear normal.  The urinary bladder is within normal limits.  Previous hysterectomy.  Cyst within the right ovary measures 2.5 cm.  Left ovary appears normal.  No significant free fluid identified within the pelvis.  There are no enlarged upper abdominal lymph nodes.  There are no enlarged pelvic or inguinal lymph nodes.  The stomach appears normal.  The small bowel loops have a normal course and caliber.  The appendix is visualized and is normal. Normal appearance of the colon.  Bones/Musculoskeletal:  Review of the visualized osseous structures is unremarkable.   IMPRESSION:  1.  Status post cholecystectomy with mild increase in caliber of the common bile duct. 2.  Prior hysterectomy.  Right ovarian cyst identified.   Original Report Authenticated By: Signa Kell, M.D.      1. Leg pain   2. Abdominal pain       MDM  11:05 AM Basic labs pending. [place] bilateral lower extremity pending. Patient will  have morphine for pain.   12:20 PM Lower extremity US shows no clot.   1:41 PM Patient admits to some relief of pain. Patient having lower abdominal tenderness and WBC 16. Patient will have CT abdomen pelvis. Patient walking with minor difficulty.    4:33 PM CT scan unremarkable. Patient will be discharged with pain medication. No neurologic deficits, no back pain, no bowel/bladder incontinence. No further evaluation needed at this time.   Emilia Beck, PA-C 03/06/12 [address] PA-C 03/06/12 1655

## 2012-03-06 NOTE — ED Notes (Signed)
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

## 2012-03-06 NOTE — ED Notes (Signed)
PA at bedside.

## 2012-03-06 NOTE — Progress Notes (Signed)
*  PRELIMINARY RESULTS* Vascular Ultrasound Lower extremity venous duplex has been completed.  Preliminary findings: Bilateral:  No evidence of DVT, superficial thrombosis, or Baker's Cyst.            Checked arterial flow as well. Appears normal bilaterally.    Farrel Demark, RDMS, RVT 03/06/2012, 12:05 PM

## 2012-03-06 NOTE — ED Notes (Signed)
Attempted to collect urine, patient stated that she didn't have to go to the bathroom, rolled back over and went back to sleep

## 2012-03-06 NOTE — ED Notes (Signed)
Ultrasound at pt's bedside.

## 2012-03-07 NOTE — ED Provider Notes (Signed)
Medical screening examination/treatment/procedure(s) were performed by non-physician practitioner and as supervising physician I was immediately available for consultation/collaboration.  Tobin Chad, MD 03/07/12 [phone]

## 2012-06-18 ENCOUNTER — Encounter (HOSPITAL_COMMUNITY): Payer: Self-pay

## 2012-06-18 ENCOUNTER — Emergency Department (HOSPITAL_COMMUNITY)
Admission: EM | Admit: 2012-06-18 | Discharge: 2012-06-18 | Disposition: A | Discharge status: Home / Self Care | Payer: Medicaid Other | Attending: Emergency Medicine | Admitting: Emergency Medicine

## 2012-06-18 DIAGNOSIS — Z862 Personal history of diseases of the blood and blood-forming organs and certain disorders involving the immune mechanism: Secondary | ICD-10-CM | POA: Insufficient documentation

## 2012-06-18 DIAGNOSIS — Z3202 Encounter for pregnancy test, result negative: Secondary | ICD-10-CM | POA: Insufficient documentation

## 2012-06-18 DIAGNOSIS — Z202 Contact with and (suspected) exposure to infections with a predominantly sexual mode of transmission: Secondary | ICD-10-CM

## 2012-06-18 DIAGNOSIS — Z8659 Personal history of other mental and behavioral disorders: Secondary | ICD-10-CM | POA: Insufficient documentation

## 2012-06-18 DIAGNOSIS — J45909 Unspecified asthma, uncomplicated: Secondary | ICD-10-CM | POA: Insufficient documentation

## 2012-06-18 DIAGNOSIS — Z79899 Other long term (current) drug therapy: Secondary | ICD-10-CM | POA: Insufficient documentation

## 2012-06-18 DIAGNOSIS — F319 Bipolar disorder, unspecified: Secondary | ICD-10-CM | POA: Insufficient documentation

## 2012-06-18 DIAGNOSIS — F17200 Nicotine dependence, unspecified, uncomplicated: Secondary | ICD-10-CM | POA: Insufficient documentation

## 2012-06-18 DIAGNOSIS — F209 Schizophrenia, unspecified: Secondary | ICD-10-CM | POA: Insufficient documentation

## 2012-06-18 HISTORY — DX: Bipolar disorder, unspecified: F31.9

## 2012-06-18 LAB — URINALYSIS, ROUTINE W REFLEX MICROSCOPIC
Bilirubin Urine: NEGATIVE
Ketones, ur: NEGATIVE mg/dL
Leukocytes, UA: NEGATIVE
Nitrite: NEGATIVE
Specific Gravity, Urine: 1.006 (ref 1.005–1.030)
Urobilinogen, UA: 0.2 mg/dL (ref 0.0–1.0)

## 2012-06-18 MED ORDER — CEFTRIAXONE SODIUM 250 MG IJ SOLR
250.0000 mg | INTRAMUSCULAR | Status: DC
  Administered 2012-06-18: 250 mg via INTRAMUSCULAR
  Filled 2012-06-18: qty 250

## 2012-06-18 MED ORDER — AZITHROMYCIN 250 MG PO TABS
1000.0000 mg | ORAL_TABLET | Freq: Once | ORAL | Status: AC
  Administered 2012-06-18: 1000 mg via ORAL
  Filled 2012-06-18: qty 4

## 2012-06-18 NOTE — ED Provider Notes (Signed)
History     CSN: [id number] Arrival date & time 06/18/12  1344 First MD Initiated Contact with Patient 06/18/12 1531      Chief Complaint  Patient presents with  . SEXUALLY TRANSMITTED DISEASE    HPI Pt was told by a sexual partner that they had been exposed to chlamydia.  Pt denies any symptoms but came here to get checked out.  No vomiting, no fever, no discharge at this time.  Pt has some white discharge but this is usual for her. No odors.   Past Medical History  Diagnosis Date  . G6PD (glucose 6 phosphatase deficiency)   . Anemia   . G6PD deficiency anemia   . Blood transfusion   . Asthma     rarely uses albuterol rescue  . Mental disorder   . Schizophrenia   . Schizophrenia   . Bipolar 1 disorder     Past Surgical History  Procedure Laterality Date  . Tubal ligation    . Cholecystectomy  2011  . Abdominal hysterectomy  05/26/2011    Procedure: HYSTERECTOMY ABDOMINAL;  Surgeon: Catalina Antigua, MD;  Location: [facility];  Service: Gynecology;  Laterality: N/A;  . Tubal ligation      Family History  Problem Relation Age of Onset  . Hypertension Mother     History  Substance Use Topics  . Smoking status: Current Every Day Smoker -- 0.20 packs/day for 15 years    Types: Cigarettes  . Smokeless tobacco: Never Used  . Alcohol Use: No    OB History   Grav Para Term Preterm Abortions TAB SAB Ect Mult Living   6 6 5 1  0 0 0 0 0 6      Review of Systems  Constitutional: Negative for fever.  Genitourinary: Negative for dysuria, frequency, genital sores, pelvic pain and dyspareunia.  All other systems reviewed and are negative.    Allergies  Bactrim; Codeine; Penicillins; Aspirin; and Ibuprofen  Home Medications   Current Outpatient Rx  Name  Route  Sig  Dispense  Refill  . albuterol (PROVENTIL HFA;VENTOLIN HFA) 108 (90 BASE) MCG/ACT inhaler   Inhalation   Inhale 2 puffs into the lungs every 6 (six) hours as needed. For asthma          .  diphenhydrAMINE (BENADRYL) 25 MG tablet   Oral   Take 25 mg by mouth at bedtime.          . hydrOXYzine (VISTARIL) 25 MG capsule   Oral   Take 25 mg by mouth at bedtime as needed (for sleep.).         Marland Kitchen lurasidone (LATUDA) 80 MG TABS   Oral   Take 80 mg by mouth at bedtime.          . Multiple Vitamins-Minerals (MULTIVITAMIN WITH MINERALS) tablet   Oral   Take 1 tablet by mouth 2 (two) times daily.          . risperiDONE (RISPERDAL) 1 MG tablet   Oral   Take 1 mg by mouth 2 (two) times daily.           BP 122/86  Pulse 78  Temp(Src) 97.8 F (36.6 C) (Oral)  Resp 15  SpO2 100%  LMP 04/12/2011  Physical Exam  Nursing note and vitals reviewed. Constitutional: She appears well-developed and well-nourished. No distress.  HENT:  Head: Normocephalic and atraumatic.  Right Ear: External ear normal.  Left Ear: External ear normal.  Eyes: Conjunctivae are normal. Right eye  exhibits no discharge. Left eye exhibits no discharge. No scleral icterus.  Neck: Neck supple. No tracheal deviation present.  Cardiovascular: Normal rate, regular rhythm and intact distal pulses.   Pulmonary/Chest: Effort normal and breath sounds normal. No stridor. No respiratory distress. She has no wheezes. She has no rales.  Abdominal: Soft. Bowel sounds are normal. She exhibits no distension. There is no tenderness. There is no rebound and no guarding.  Musculoskeletal: She exhibits no edema and no tenderness.  Neurological: She is alert. She has normal strength. No sensory deficit. Cranial nerve deficit:  no gross defecits noted. She exhibits normal muscle tone. She displays no seizure activity. Coordination normal.  Skin: Skin is warm and dry. No rash noted.  Psychiatric: She has a normal mood and affect.    ED Course  Procedures (including critical care time)  Labs Reviewed  URINALYSIS, ROUTINE W REFLEX MICROSCOPIC - Abnormal; Notable for the following:    APPearance CLOUDY (*)    Hgb  urine dipstick TRACE (*)    All other components within normal limits  URINE MICROSCOPIC-ADD ON - Abnormal; Notable for the following:    Squamous Epithelial / LPF FEW (*)    Bacteria, UA FEW (*)    All other components within normal limits  POCT PREGNANCY, URINE   No results found.   No diagnosis found.    MDM  Will treat pt empirically for chlamydia and gonorrhea exposure.  Recc follow up at public health or PCP after treatment.        Celene Kras, MD 06/18/12 [phone]

## 2012-06-18 NOTE — ED Notes (Signed)
Patient reports that her sexual partner reported to the patient that he had been exposed to an STD and patient was wanting to be checked. Patient states she has a white vaginal discharge., but denies any abdominal pain.

## 2012-08-25 ENCOUNTER — Encounter (HOSPITAL_COMMUNITY): Payer: Self-pay | Admitting: Emergency Medicine

## 2012-08-25 ENCOUNTER — Emergency Department (HOSPITAL_COMMUNITY)
Admission: EM | Admit: 2012-08-25 | Discharge: 2012-08-25 | Disposition: A | Discharge status: Home / Self Care | Payer: Medicaid Other | Attending: Emergency Medicine | Admitting: Emergency Medicine

## 2012-08-25 DIAGNOSIS — K0381 Cracked tooth: Secondary | ICD-10-CM | POA: Insufficient documentation

## 2012-08-25 DIAGNOSIS — Z79899 Other long term (current) drug therapy: Secondary | ICD-10-CM | POA: Insufficient documentation

## 2012-08-25 DIAGNOSIS — D649 Anemia, unspecified: Secondary | ICD-10-CM | POA: Insufficient documentation

## 2012-08-25 DIAGNOSIS — K0889 Other specified disorders of teeth and supporting structures: Secondary | ICD-10-CM

## 2012-08-25 DIAGNOSIS — F319 Bipolar disorder, unspecified: Secondary | ICD-10-CM | POA: Insufficient documentation

## 2012-08-25 DIAGNOSIS — D551 Anemia due to other disorders of glutathione metabolism: Secondary | ICD-10-CM | POA: Insufficient documentation

## 2012-08-25 DIAGNOSIS — E7400 Glycogen storage disease, unspecified: Secondary | ICD-10-CM | POA: Insufficient documentation

## 2012-08-25 DIAGNOSIS — K029 Dental caries, unspecified: Secondary | ICD-10-CM | POA: Insufficient documentation

## 2012-08-25 DIAGNOSIS — F17200 Nicotine dependence, unspecified, uncomplicated: Secondary | ICD-10-CM | POA: Insufficient documentation

## 2012-08-25 MED ORDER — OXYCODONE-ACETAMINOPHEN 5-325 MG PO TABS: 1.0000 | ORAL_TABLET | ORAL | Status: DC | PRN

## 2012-08-25 MED ORDER — OXYCODONE-ACETAMINOPHEN 5-325 MG PO TABS
1.0000 | ORAL_TABLET | Freq: Once | ORAL | Status: AC
  Administered 2012-08-25: 1 via ORAL
  Filled 2012-08-25: qty 1

## 2012-08-25 MED ORDER — CLINDAMYCIN HCL 150 MG PO CAPS: 300.0000 mg | ORAL_CAPSULE | Freq: Four times a day (QID) | ORAL | Status: DC

## 2012-08-25 NOTE — ED Notes (Signed)
Pt c/o of tooth pain from broke tooth on left lower jaw.VSS

## 2012-08-25 NOTE — ED Provider Notes (Signed)
History  This chart was scribed for non-physician practitioner Jaynie Crumble, PA-C working with Raeford Razor, MD, by Candelaria Stagers, ED Scribe. This patient was seen in room WTR9/WTR9 and the patient's care was started at 4:03 PM   CSN: [id number]  Arrival date & time 08/25/12  1443   First MD Initiated Contact with Patient 08/25/12 1514      Chief Complaint  Patient presents with  . Dental Pain     The history is provided by the patient. No language interpreter was used.   HPI Comments: Valerie Lee is a 37 y.o. female who presents to the Emergency Department complaining of constant left lower dental pain that started three days ago after she bit into an apple and the tooth cracked.  Pt reports she is also experiencing tongue swelling.  She denies trouble swallowing, fever, chills, nausea, or vomiting. She has taken nothing for the pain.    Past Medical History  Diagnosis Date  . G6PD (glucose 6 phosphatase deficiency)   . Anemia   . G6PD deficiency anemia   . Blood transfusion   . Asthma     rarely uses albuterol rescue  . Mental disorder   . Schizophrenia   . Schizophrenia   . Bipolar 1 disorder     Past Surgical History  Procedure Laterality Date  . Tubal ligation    . Cholecystectomy  2011  . Abdominal hysterectomy  05/26/2011    Procedure: HYSTERECTOMY ABDOMINAL;  Surgeon: Catalina Antigua, MD;  Location: [facility];  Service: Gynecology;  Laterality: N/A;  . Tubal ligation      Family History  Problem Relation Age of Onset  . Hypertension Mother     History  Substance Use Topics  . Smoking status: Current Every Day Smoker -- 0.20 packs/day for 15 years    Types: Cigarettes  . Smokeless tobacco: Never Used  . Alcohol Use: No    OB History   Grav Para Term Preterm Abortions TAB SAB Ect Mult Living   6 6 5 1  0 0 0 0 0 6      Review of Systems  Constitutional: Negative for fever and chills.  HENT: Positive for dental problem (left lower dental  pain). Negative for trouble swallowing.   Gastrointestinal: Negative for nausea, vomiting and diarrhea.  All other systems reviewed and are negative.    Allergies  Bactrim; Codeine; Penicillins; Aspirin; and Ibuprofen  Home Medications   Current Outpatient Rx  Name  Route  Sig  Dispense  Refill  . albuterol (PROVENTIL HFA;VENTOLIN HFA) 108 (90 BASE) MCG/ACT inhaler   Inhalation   Inhale 2 puffs into the lungs every 6 (six) hours as needed. For asthma          . diphenhydrAMINE (BENADRYL) 25 MG tablet   Oral   Take 25 mg by mouth at bedtime.          Marland Kitchen HYDROcodone-acetaminophen (NORCO/VICODIN) 5-325 MG per tablet   Oral   Take 1 tablet by mouth every 6 (six) hours as needed for pain.         . hydrOXYzine (VISTARIL) 25 MG capsule   Oral   Take 25 mg by mouth at bedtime as needed (for sleep.).         Marland Kitchen lurasidone (LATUDA) 80 MG TABS   Oral   Take 80 mg by mouth at bedtime.          . Multiple Vitamins-Minerals (MULTIVITAMIN WITH MINERALS) tablet   Oral  Take 1 tablet by mouth 2 (two) times daily.          Marland Kitchen oxyCODONE-acetaminophen (PERCOCET/ROXICET) 5-325 MG per tablet   Oral   Take 1 tablet by mouth every 4 (four) hours as needed for pain.         Marland Kitchen risperiDONE (RISPERDAL) 1 MG tablet   Oral   Take 1 mg by mouth 2 (two) times daily.           BP 98/61  Pulse 68  Temp(Src) 98.3 F (36.8 C) (Oral)  Resp 18  SpO2 98%  LMP 04/12/2011  Physical Exam  Nursing note and vitals reviewed. Constitutional: She is oriented to person, place, and time. She appears well-developed and well-nourished. No distress.  HENT:  Head: Normocephalic and atraumatic.  Left second and third lower molars with large cavities, partially avulsed with surrounding gum swelling and tenderness.  No facial swelling.  Tender in the left submandibular area.  Eyes: EOM are normal.  Neck: Neck supple. No tracheal deviation present.  Cardiovascular: Normal rate.    Pulmonary/Chest: Effort normal. No respiratory distress.  Musculoskeletal: Normal range of motion.  Neurological: She is alert and oriented to person, place, and time.  Skin: Skin is warm and dry.  Psychiatric: She has a normal mood and affect. Her behavior is normal.    ED Course  Procedures   DIAGNOSTIC STUDIES: Oxygen Saturation is 98% on room air, normal by my interpretation.    COORDINATION OF CARE:  4:05 PM Discussed course of care with pt which includes antibiotic and pain medication.  Advised pt to follow up with a dentist.  Pt understands and agrees.   Labs Reviewed - No data to display No results found.   1. Pain, dental       MDM  Pt with dental pain due to carries and possible early infection. She is afebrile, no facial swelling, no swelling over the neck or under the tongue. She will be treated with clindmycin, she is pen allergic, and percocet for pain, allergic to ibuprofen. She will be given referral to a dentist. Instructed to follow up Monday.   Filed Vitals:   08/25/12 1504  BP: 98/61  Pulse: 68  Temp: 98.3 F (36.8 C)  Resp: 18   - I personally performed the services described in this documentation, which was scribed in my presence. The recorded information has been reviewed and is accurate.       Lottie Mussel, PA-C 08/26/12 0207

## 2012-08-30 NOTE — ED Provider Notes (Signed)
Medical screening examination/treatment/procedure(s) were performed by non-physician practitioner and as supervising physician I was immediately available for consultation/collaboration.  Mertie Haslem, MD 08/30/12 0516 

## 2013-02-26 ENCOUNTER — Emergency Department (EMERGENCY_DEPARTMENT_HOSPITAL)
Admission: EM | Admit: 2013-02-26 | Discharge: 2013-02-27 | Disposition: A | Discharge status: Other Acute Inpatient Hospital | Payer: Medicare Other | Source: Home / Self Care | Attending: Emergency Medicine | Admitting: Emergency Medicine

## 2013-02-26 ENCOUNTER — Encounter (HOSPITAL_COMMUNITY): Payer: Self-pay | Admitting: Emergency Medicine

## 2013-02-26 DIAGNOSIS — J45909 Unspecified asthma, uncomplicated: Secondary | ICD-10-CM | POA: Insufficient documentation

## 2013-02-26 DIAGNOSIS — F489 Nonpsychotic mental disorder, unspecified: Secondary | ICD-10-CM | POA: Insufficient documentation

## 2013-02-26 DIAGNOSIS — F909 Attention-deficit hyperactivity disorder, unspecified type: Secondary | ICD-10-CM | POA: Insufficient documentation

## 2013-02-26 DIAGNOSIS — F22 Delusional disorders: Secondary | ICD-10-CM

## 2013-02-26 DIAGNOSIS — Z862 Personal history of diseases of the blood and blood-forming organs and certain disorders involving the immune mechanism: Secondary | ICD-10-CM | POA: Insufficient documentation

## 2013-02-26 DIAGNOSIS — Z880 Allergy status to penicillin: Secondary | ICD-10-CM | POA: Insufficient documentation

## 2013-02-26 DIAGNOSIS — F209 Schizophrenia, unspecified: Secondary | ICD-10-CM | POA: Insufficient documentation

## 2013-02-26 DIAGNOSIS — IMO0002 Reserved for concepts with insufficient information to code with codable children: Secondary | ICD-10-CM | POA: Insufficient documentation

## 2013-02-26 DIAGNOSIS — F411 Generalized anxiety disorder: Secondary | ICD-10-CM | POA: Insufficient documentation

## 2013-02-26 DIAGNOSIS — F319 Bipolar disorder, unspecified: Secondary | ICD-10-CM | POA: Insufficient documentation

## 2013-02-26 DIAGNOSIS — F17200 Nicotine dependence, unspecified, uncomplicated: Secondary | ICD-10-CM | POA: Insufficient documentation

## 2013-02-26 DIAGNOSIS — Z79899 Other long term (current) drug therapy: Secondary | ICD-10-CM | POA: Insufficient documentation

## 2013-02-26 DIAGNOSIS — R000 Tachycardia, unspecified: Secondary | ICD-10-CM | POA: Insufficient documentation

## 2013-02-26 LAB — COMPREHENSIVE METABOLIC PANEL
AST: 18 U/L (ref 0–37)
BUN: 10 mg/dL (ref 6–23)
CO2: 22 mEq/L (ref 19–32)
Chloride: 99 mEq/L (ref 96–112)
Creatinine, Ser: 0.66 mg/dL (ref 0.50–1.10)
GFR calc Af Amer: >90 mL/min (ref >90)
GFR calc non Af Amer: >90 mL/min (ref >90)
Glucose, Bld: 87 mg/dL (ref 70–99)
Total Bilirubin: 0.4 mg/dL (ref 0.3–1.2)

## 2013-02-26 LAB — CBC
HCT: 37.7 % (ref 36.0–46.0)
MCV: 96.4 fL (ref 78.0–100.0)
RBC: 3.91 MIL/uL (ref 3.87–5.11)
WBC: 13.9 10*3/uL — ABNORMAL HIGH (ref 4.0–10.5)

## 2013-02-26 LAB — RAPID URINE DRUG SCREEN, HOSP PERFORMED: Amphetamines: NOT DETECTED

## 2013-02-26 LAB — SALICYLATE LEVEL: Salicylate Lvl: <2 mg/dL — ABNORMAL LOW (ref 2.8–20.0)

## 2013-02-26 LAB — ETHANOL: Alcohol, Ethyl (B): <11 mg/dL (ref 0–11)

## 2013-02-26 MED ORDER — ZOLPIDEM TARTRATE 5 MG PO TABS
5.0000 mg | ORAL_TABLET | Freq: Every evening | ORAL | Status: DC | PRN
Start: 2013-02-26 — End: 2013-02-27

## 2013-02-26 MED ORDER — RISPERIDONE 1 MG PO TABS: 1.0000 mg | ORAL_TABLET | Freq: Two times a day (BID) | ORAL | Status: DC

## 2013-02-26 MED ORDER — LORAZEPAM 1 MG PO TABS
2.0000 mg | ORAL_TABLET | Freq: Once | ORAL | Status: AC
  Administered 2013-02-26: 2 mg via ORAL
  Filled 2013-02-26: qty 2

## 2013-02-26 MED ORDER — ONDANSETRON HCL 4 MG PO TABS: 4.0000 mg | ORAL_TABLET | Freq: Three times a day (TID) | ORAL | Status: DC | PRN

## 2013-02-26 MED ORDER — NICOTINE 21 MG/24HR TD PT24: 21.0000 mg | MEDICATED_PATCH | Freq: Every day | TRANSDERMAL | Status: DC

## 2013-02-26 NOTE — ED Notes (Addendum)
Pt tearful with cover over her head. Pt states that "they are going to get her". When asking about "them" she states she does not know them, that they are all kinds of people from women, men, boys and girl and that they keep coming even after she prays. Pt states that the voices are telling her to kill herself. Pt states that she wants to kill herself but doe not have a plan.

## 2013-02-26 NOTE — ED Provider Notes (Addendum)
CSN: [id number]     Arrival date & time 02/26/13  2136 History  This chart was scribed for Earley Favor, NP, working with Gilda Crease, * by Blanchard Kelch, ED Scribe. This patient was seen in room WTR3/WLPT3 and the patient's care was started at 9:40 PM.    Chief Complaint  Patient presents with  . Medical Clearance    The history is provided by the patient. No language interpreter was used.    HPI Comments: Valerie Lee is a 37 y.o. female who presents to the Emergency Department for a medical clearance. She states that she believes people are going to get her. She does not know their names, how many or what they look like. She has been hearing the voices intermittently today, but it is worsening. She usually takes medications for her schizophrenia but she ran out. She does not know when she ran out. She sees doctors at [facility] to get her prescriptions filled but does not know when she last saw them. She lives at home with her daughters. Her boyfriend called the EMS tonight. She states she has been eating and has not been vomiting. She denies there is any chance she could be pregnant because she has had a hysterectomy.  Past Medical History  Diagnosis Date  . G6PD (glucose 6 phosphatase deficiency)   . Anemia   . G6PD deficiency anemia   . Blood transfusion   . Asthma     rarely uses albuterol rescue  . Mental disorder   . Schizophrenia   . Schizophrenia   . Bipolar 1 disorder    Past Surgical History  Procedure Laterality Date  . Tubal ligation    . Cholecystectomy  2011  . Abdominal hysterectomy  05/26/2011    Procedure: HYSTERECTOMY ABDOMINAL;  Surgeon: Catalina Antigua, MD;  Location: [facility];  Service: Gynecology;  Laterality: N/A;  . Tubal ligation     Family History  Problem Relation Age of Onset  . Hypertension Mother    History  Substance Use Topics  . Smoking status: Current Every Day Smoker -- 0.20 packs/day for 15 years    Types: Cigarettes  .  Smokeless tobacco: Never Used  . Alcohol Use: No   OB History   Grav Para Term Preterm Abortions TAB SAB Ect Mult Living   6 6 5 1  0 0 0 0 0 6     Review of Systems  Psychiatric/Behavioral: Positive for agitation. Negative for suicidal ideas. The patient is hyperactive.   All other systems reviewed and are negative.    Allergies  Bactrim; Codeine; Penicillins; Aspirin; and Ibuprofen  Home Medications   Current Outpatient Rx  Name  Route  Sig  Dispense  Refill  . risperiDONE (RISPERDAL) 1 MG tablet   Oral   Take 1 mg by mouth 2 (two) times daily.          Triage Vitals: BP 133/65  Pulse 107  Temp(Src) 98.5 F (36.9 C) (Oral)  Resp 20  SpO2 100%  LMP 04/12/2011  Physical Exam  Nursing note and vitals reviewed. Constitutional: She appears well-developed and well-nourished.  Eyes: Pupils are equal, round, and reactive to light.  Cardiovascular: Regular rhythm.  Tachycardia present.   Pulmonary/Chest: Effort normal.  Abdominal: Soft.  Musculoskeletal: Normal range of motion.  Neurological: She is alert.  Skin: Skin is warm. No rash noted.  Psychiatric: Her mood appears anxious. Her speech is rapid and/or pressured. She is agitated and actively hallucinating. Thought content is  paranoid. Cognition and memory are impaired. She expresses inappropriate judgment. She expresses no homicidal and no suicidal ideation. She expresses no suicidal plans and no homicidal plans.    ED Course  Procedures (including critical care time)  DIAGNOSTIC STUDIES: Oxygen Saturation is 100% on room air, normal by my interpretation.    COORDINATION OF CARE: 6:07 AM -Will order labs. Patient verbalizes understanding and agrees with treatment plan.    Labs Review Labs Reviewed  CBC - Abnormal; Notable for the following:    WBC 13.9 (*)    All other components within normal limits  COMPREHENSIVE METABOLIC PANEL - Abnormal; Notable for the following:    Sodium 133 (*)    Potassium 3.2  (*)    All other components within normal limits  SALICYLATE LEVEL - Abnormal; Notable for the following:    Salicylate Lvl <2.0 (*)    All other components within normal limits  URINE RAPID DRUG SCREEN (HOSP PERFORMED) - Abnormal; Notable for the following:    Tetrahydrocannabinol POSITIVE (*)    All other components within normal limits  ACETAMINOPHEN LEVEL  ETHANOL   Imaging Review No results found.  EKG Interpretation   None       MDM   1. Paranoia     After PO Ativan patient now able to leave room and use phone no longer hiding under a blanket in fear   I personally performed the services described in this documentation, which was scribed in my presence. The recorded information has been reviewed and is accurate.    Arman Filter, NP 02/26/13 2303  Arman Filter, NP 02/27/13 0549  Arman Filter, NP 02/27/13 [phone]

## 2013-02-26 NOTE — ED Notes (Signed)
Bed: WLPT3 Expected date:  Expected time:  Means of arrival:  Comments: EMS schizo -off meds, anxiety

## 2013-02-26 NOTE — ED Provider Notes (Signed)
Medical screening examination/treatment/procedure(s) were performed by non-physician practitioner and as supervising physician I was immediately available for consultation/collaboration.  EKG Interpretation   None         Gilda Crease, MD 02/26/13 [phone]

## 2013-02-26 NOTE — ED Notes (Signed)
Pt has in belonging bag: blue and red KD shoes, multi colored socks, blue jeans, black belt, grey and black jacket, grey t-shirt, pink colored bra, and large pink blanket.

## 2013-02-27 ENCOUNTER — Encounter (HOSPITAL_COMMUNITY): Payer: Self-pay | Admitting: *Deleted

## 2013-02-27 ENCOUNTER — Inpatient Hospital Stay (HOSPITAL_COMMUNITY)
Admission: EM | Admit: 2013-02-27 | Discharge: 2013-03-07 | DRG: 885 | Disposition: A | Discharge status: Home / Self Care | Payer: Medicare Other | Source: Intra-hospital | Attending: Psychiatry | Admitting: Psychiatry

## 2013-02-27 DIAGNOSIS — IMO0002 Reserved for concepts with insufficient information to code with codable children: Secondary | ICD-10-CM

## 2013-02-27 DIAGNOSIS — F22 Delusional disorders: Secondary | ICD-10-CM

## 2013-02-27 DIAGNOSIS — Z79899 Other long term (current) drug therapy: Secondary | ICD-10-CM

## 2013-02-27 DIAGNOSIS — F319 Bipolar disorder, unspecified: Secondary | ICD-10-CM | POA: Diagnosis present

## 2013-02-27 DIAGNOSIS — F4310 Post-traumatic stress disorder, unspecified: Secondary | ICD-10-CM

## 2013-02-27 DIAGNOSIS — J45909 Unspecified asthma, uncomplicated: Secondary | ICD-10-CM | POA: Diagnosis present

## 2013-02-27 DIAGNOSIS — F200 Paranoid schizophrenia: Principal | ICD-10-CM | POA: Diagnosis present

## 2013-02-27 DIAGNOSIS — F329 Major depressive disorder, single episode, unspecified: Secondary | ICD-10-CM | POA: Diagnosis present

## 2013-02-27 MED ORDER — ACETAMINOPHEN 325 MG PO TABS: 650.0000 mg | ORAL_TABLET | Freq: Four times a day (QID) | ORAL | Status: DC | PRN

## 2013-02-27 MED ORDER — HALOPERIDOL 2 MG PO TABS
2.0000 mg | ORAL_TABLET | Freq: Two times a day (BID) | ORAL | Status: DC
  Administered 2013-02-27 – 2013-02-28 (×2): 2 mg via ORAL
  Filled 2013-02-27 (×4): qty 1

## 2013-02-27 MED ORDER — POTASSIUM CHLORIDE CRYS ER 20 MEQ PO TBCR
40.0000 meq | EXTENDED_RELEASE_TABLET | Freq: Once | ORAL | Status: AC
  Administered 2013-02-27: 40 meq via ORAL
  Filled 2013-02-27: qty 2

## 2013-02-27 MED ORDER — TRAZODONE HCL 50 MG PO TABS
50.0000 mg | ORAL_TABLET | Freq: Every evening | ORAL | Status: DC | PRN
  Administered 2013-03-01 – 2013-03-06 (×5): 50 mg via ORAL
  Filled 2013-02-27: qty 1
  Filled 2013-02-27: qty 3
  Filled 2013-02-27 (×4): qty 1

## 2013-02-27 MED ORDER — ALBUTEROL SULFATE HFA 108 (90 BASE) MCG/ACT IN AERS: 2.0000 | INHALATION_SPRAY | Freq: Four times a day (QID) | RESPIRATORY_TRACT | Status: DC | PRN

## 2013-02-27 MED ORDER — INFLUENZA VAC SPLIT QUAD 0.5 ML IM SUSP
0.5000 mL | INTRAMUSCULAR | Status: DC
  Filled 2013-02-27: qty 0.5

## 2013-02-27 MED ORDER — ALUM & MAG HYDROXIDE-SIMETH 200-200-20 MG/5ML PO SUSP: 30.0000 mL | ORAL | Status: DC | PRN

## 2013-02-27 MED ORDER — RISPERIDONE 1 MG PO TABS
1.0000 mg | ORAL_TABLET | Freq: Two times a day (BID) | ORAL | Status: DC
  Filled 2013-02-27 (×3): qty 1

## 2013-02-27 MED ORDER — MAGNESIUM HYDROXIDE 400 MG/5ML PO SUSP: 30.0000 mL | Freq: Every day | ORAL | Status: DC | PRN

## 2013-02-27 MED ORDER — HYDROXYZINE HCL 25 MG PO TABS: 25.0000 mg | ORAL_TABLET | Freq: Four times a day (QID) | ORAL | Status: DC | PRN

## 2013-02-27 MED ORDER — CITALOPRAM HYDROBROMIDE 10 MG PO TABS
10.0000 mg | ORAL_TABLET | Freq: Every day | ORAL | Status: DC
  Administered 2013-02-27 – 2013-02-28 (×2): 10 mg via ORAL
  Filled 2013-02-27 (×3): qty 1

## 2013-02-27 NOTE — Tx Team (Signed)
Initial Interdisciplinary Treatment Plan  PATIENT STRENGTHS: (choose at least two) Ability for insight Average or above average intelligence Communication skills Motivation for treatment/growth Supportive family/friends  PATIENT STRESSORS: Financial difficulties Medication change or noncompliance   PROBLEM LIST: Problem List/Patient Goals Date to be addressed Date deferred Reason deferred Estimated date of resolution  Psychosis 02/27/2013     Suicidal Ideation 02/27/2013     Previous suicide attempts 02/27/2013     Recent divorce 02/27/2013     Financial difficulties 02/27/2013     Medication stabilization                         DISCHARGE CRITERIA:  Improved stabilization in mood, thinking, and/or behavior Motivation to continue treatment in a less acute level of care Need for constant or close observation no longer present Verbal commitment to aftercare and medication compliance  PRELIMINARY DISCHARGE PLAN: Attend aftercare/continuing care group Outpatient therapy Return to previous living arrangement  PATIENT/FAMIILY INVOLVEMENT: This treatment plan has been presented to and reviewed with the patient, Valerie Lee.  The patient and family have been given the opportunity to ask questions and make suggestions.  Cranford Mon 02/27/2013, 11:32 AM
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
## 2013-02-27 NOTE — ED Provider Notes (Signed)
7:46 AM Pt to be transferred to [facility] under Dr. Jannifer Franklin.  I was not directly involved in pt care or disposition planning.    1. Paranoia      Shanna Cisco, MD 02/27/13 [phone]
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
## 2013-02-27 NOTE — BHH Suicide Risk Assessment (Signed)
Suicide Risk Assessment  Admission Assessment     Nursing information obtained from:    Demographic factors:    Current Mental Status:    Loss Factors:    Historical Factors:    Risk Reduction Factors:     CLINICAL FACTORS:   Depression:   Anhedonia Comorbid alcohol abuse/dependence Delusional Hopelessness Impulsivity Insomnia Alcohol/Substance Abuse/Dependencies Currently Psychotic Unstable or Poor Therapeutic Relationship  COGNITIVE FEATURES THAT CONTRIBUTE TO RISK:  Closed-mindedness Polarized thinking    SUICIDE RISK:   Minimal: No identifiable suicidal ideation.  Patients presenting with no risk factors but with morbid ruminations; may be classified as minimal risk based on the severity of the depressive symptoms  PLAN OF CARE:1. Admit for crisis management and stabilization. 2. Medication management to reduce current symptoms to base line and improve the     patient's overall level of functioning 3. Treat health problems as indicated. 4. Develop treatment plan to decrease risk of relapse upon discharge and the need for     readmission. 5. Psycho-social education regarding relapse prevention and self care. 6. Health care follow up as needed for medical problems. 7. Restart home medications where appropriate.  I certify that inpatient services furnished can reasonably be expected to improve the patient's condition.  Thedore Mins, MD 02/27/2013, 11:47 AM

## 2013-02-27 NOTE — Progress Notes (Signed)
Patient ID: Valerie Lee, female   DOB: 09/25/1975, 37 y.o.   MRN: [id number] Nursing admission note:  Patient brought into ED after boyfriend called EMS.  Patient believes that "people are following her and out to get her."  She reports auditory and visual hallucinations.  She has passive SI with no specific plan.  Patient has a hx of suicide attempts, with the most recent two weeks ago.  Patient stated, "I tried to jump out a window and someone caught me before I went out."  She is paranoid and suspicious.  She does not feel safe with a lot people around.  This morning MD spoke with her and patient kept the blanket up around her mouth stating, "I'm afraid of you."  Patient was assured of her safety on the unit.  Patient states she has been off her medication for a while due to not being able to afford them.  She is living with her 4 daughters and went through a divorce 2 weeks ago.  Patient med hx G6PD dificiency anemia.  She has a hs of schizophrenia and bipolar disorder.  Patient oriented to unit and room.  Patient was last here in 2012.

## 2013-02-27 NOTE — Progress Notes (Signed)
Adult Psychoeducational Group Note  Date:  02/27/2013 Time:  1:11 PM  Group Topic/Focus:  Dimensions of Wellness:   The focus of this group is to introduce the topic of wellness and discuss the role each dimension of wellness plays in total health.  Participation Level:  Did Not Attend  Participation Quality:  Did Not Attend  Affect:  Did Not Attend  Cognitive:  Did Not Attend  Insight: None  Engagement in Group:  Did Not Attend  Modes of Intervention:  Education  Additional Comments:  Patient did not attend psychoeducation group.  Merleen Milliner 02/27/2013, 1:11 PM

## 2013-02-27 NOTE — Tx Team (Signed)
  Interdisciplinary Treatment Plan Update   Date Reviewed:  02/27/2013  Time Reviewed:  1:45 PM  Progress in Treatment:   Attending groups: Yes Participating in groups: Yes Taking medication as prescribed: Yes  Tolerating medication: Yes Family/Significant other contact made:No  Patient understands diagnosis: Yes  As evidenced by asking to get back on medication Discussing patient identified problems/goals with staff: Yes  See initial care plan Medical problems stabilized or resolved: Yes Denies suicidal/homicidal ideation: Yes  In tx team Patient has not harmed self or others: Yes  For review of initial/current patient goals, please see plan of care.  Estimated Length of Stay:  4-5 days  Reason for Continuation of Hospitalization: Depression Hallucinations Medication stabilization  New Problems/Goals identified:  N/A  Discharge Plan or Barriers:   return home,. Follow up [facility]  Additional Comments:  Patient was brought in by EMS after boyfriend called for them. Patient has kept her head covered with blanket and will not look at people. Believes that "they are following her and trying to get at her." Hearing voices to harm herself.   Attendees:  Signature: Thedore Mins, MD 02/27/2013 1:45 PM   Signature: Richelle Ito, LCSW 02/27/2013 1:45 PM  Signature: Fransisca Kaufmann, NP 02/27/2013 1:45 PM  Signature: Joslyn Devon, RN 02/27/2013 1:45 PM  Signature: Liborio Nixon, RN 02/27/2013 1:45 PM  Signature:  02/27/2013 1:45 PM  Signature:   02/27/2013 1:45 PM  Signature:    Signature:    Signature:    Signature:    Signature:    Signature:      Scribe for Treatment Team:   Richelle Ito, LCSW  02/27/2013 1:45 PM
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
## 2013-02-27 NOTE — [facility] Group Notes (Signed)
[facility] Group Therapy  02/27/2013  1:53 PM  Type of Therapy:  [facility] [employer] Presentation   Participation Level:  Minimal  Participation Quality:  Drowsy  Affect:  Flat  Cognitive:  Lacking  Insight:  Poor  Engagement in Therapy:  Poor  Modes of Intervention:  Discussion, Education and Socialization   Summary of Progress/Problems:  Onalee Hua from [facility] [employer] [facility] came to present his recovery story and play the guitar.  Valeree covered her face and body for the majority of group.  She fell asleep and left in the last 15 minutes of group.    Simona Huh   02/27/2013  1:53 PM

## 2013-02-27 NOTE — Consult Note (Signed)
  Psychiatric Specialty Exam: Physical Exam  ROS  Blood pressure 101/63, pulse 76, temperature 97.7 F (36.5 C), temperature source Oral, resp. rate 18, last menstrual period 04/12/2011, SpO2 99.00%.There is no weight on file to calculate BMI.  General Appearance: Casual  Eye Contact::  Good  Speech:  Clear and Coherent  Volume:  Normal  Mood:  Flat mood  Affect:  Flat  Thought Process:  Goal Directed  Orientation:  Full (Time, Place, and Person)  Thought Content:  says she is hearing no voices this morning but she has denied voices in the past when she apparently was hearing them  Suicidal Thoughts:  No  Homicidal Thoughts:  No  Memory:  Immediate;   Fair Recent;   Fair Remote;   Fair  Judgement:  Impaired  Insight:  Shallow  Psychomotor Activity:  Normal  Concentration:  Good  Recall:  Good  Akathisia:  Negative  Handed:  Right  AIMS (if indicated):     Assets:  Social Support  Sleep:   good  Valerie Lee is being transferred to [facility] this am for treatment of her schizophrenia.

## 2013-02-27 NOTE — ED Notes (Signed)
Patient presents paranoid and withdrawn with covers over her head, initially not responding to questions then only short choppy responses to a couple of questions; appears to be responding to internal stimuli.

## 2013-02-27 NOTE — [facility] Counselor (Signed)
Support paperwork signed and faxed to [facility]. [employer] placed in pt's chart. RN Joanie Coddington and [facility] psychiatrist Dr. Ladona Ridgel aware.   Valerie Lee, [place] Assessment Counselor

## 2013-02-27 NOTE — Progress Notes (Signed)
Patient ID: Valerie Lee, female   DOB: 07/19/1975, 37 y.o.   MRN: [id number] D: Pt. Lying in bed, but makes eye contact, interacts with client, reports voices, and SI with no plan. A: Writer introduced self to client and encouraged group. Staff will monitor q50min for safety. R: Pt. Is safe on the unit, did not attend group.

## 2013-02-27 NOTE — Progress Notes (Signed)
The focus of this group is to help patients review their daily goal of treatment and discuss progress on daily workbooks. Pt attended the evening group session and responded to discussion prompts from the Writer. Pt shared that today was a good day on the unit, one highlight from which was getting along especially well with her new roommate. Pt shared that her only additional needs from Nursing Staff this evening for for towels, which were given to her following group. Pt's affect was appropriate.

## 2013-02-28 LAB — COMPREHENSIVE METABOLIC PANEL
Albumin: 3.8 g/dL (ref 3.5–5.2)
Alkaline Phosphatase: 54 U/L (ref 39–117)
BUN: 10 mg/dL (ref 6–23)
Calcium: 9.5 mg/dL (ref 8.4–10.5)
Creatinine, Ser: 0.8 mg/dL (ref 0.50–1.10)
GFR calc Af Amer: >90 mL/min (ref >90)
Glucose, Bld: 85 mg/dL (ref 70–99)
Total Protein: 7.5 g/dL (ref 6.0–8.3)

## 2013-02-28 MED ORDER — DOCUSATE SODIUM 100 MG PO CAPS
100.0000 mg | ORAL_CAPSULE | Freq: Two times a day (BID) | ORAL | Status: DC
  Administered 2013-02-28 – 2013-03-07 (×14): 100 mg via ORAL
  Filled 2013-02-28 (×19): qty 1

## 2013-02-28 MED ORDER — OLANZAPINE 5 MG PO TBDP: 5.0000 mg | ORAL_TABLET | Freq: Three times a day (TID) | ORAL | Status: DC | PRN

## 2013-02-28 MED ORDER — CITALOPRAM HYDROBROMIDE 20 MG PO TABS
20.0000 mg | ORAL_TABLET | Freq: Every day | ORAL | Status: DC
  Administered 2013-03-01 – 2013-03-07 (×7): 20 mg via ORAL
  Filled 2013-02-28 (×7): qty 1
  Filled 2013-02-28: qty 3
  Filled 2013-02-28: qty 1
  Filled 2013-02-28: qty 3

## 2013-02-28 MED ORDER — HYDROXYZINE HCL 25 MG PO TABS
25.0000 mg | ORAL_TABLET | Freq: Two times a day (BID) | ORAL | Status: DC
  Administered 2013-02-28 – 2013-03-07 (×14): 25 mg via ORAL
  Filled 2013-02-28 (×3): qty 1
  Filled 2013-02-28: qty 6
  Filled 2013-02-28 (×3): qty 1
  Filled 2013-02-28: qty 6
  Filled 2013-02-28 (×2): qty 1
  Filled 2013-02-28: qty 6
  Filled 2013-02-28 (×6): qty 1
  Filled 2013-02-28: qty 6
  Filled 2013-02-28 (×3): qty 1

## 2013-02-28 MED ORDER — HALOPERIDOL 5 MG PO TABS
5.0000 mg | ORAL_TABLET | Freq: Two times a day (BID) | ORAL | Status: DC
  Administered 2013-02-28 – 2013-03-07 (×14): 5 mg via ORAL
  Filled 2013-02-28 (×3): qty 1
  Filled 2013-02-28: qty 6
  Filled 2013-02-28: qty 1
  Filled 2013-02-28: qty 6
  Filled 2013-02-28 (×6): qty 1
  Filled 2013-02-28: qty 6
  Filled 2013-02-28 (×5): qty 1
  Filled 2013-02-28: qty 6
  Filled 2013-02-28: qty 1

## 2013-02-28 MED ORDER — ACETAMINOPHEN 325 MG PO TABS
650.0000 mg | ORAL_TABLET | Freq: Four times a day (QID) | ORAL | Status: DC | PRN
  Administered 2013-02-28 – 2013-03-03 (×4): 650 mg via ORAL
  Filled 2013-02-28 (×4): qty 2

## 2013-02-28 NOTE — Progress Notes (Signed)
[facility] MD Progress Note  02/28/2013 10:44 AM Valerie Lee  MRN:  [id number] Subjective: " I am so fearful, I don't want to be around people." Objective:  Patient reports ongoing anxiety, being fearful around people thinking that they are going to hurt her and depressive symptoms. She endorsed hearing voices telling her to hurt herself, paranoia and suicidal thoughts with no significant plan. Patient reports that she has had a lot of stressed in her life, including divorcing her husband and taking care of her 6 children. She is compliant with her medications and has not verbalized any adverse reactions. Diagnosis:   DSM5: Schizophrenia Disorders:  Delusional Disorder (297.1) and Brief Psychotic Disorder (298.8) Obsessive-Compulsive Disorders:   Trauma-Stressor Disorders:   Substance/Addictive Disorders:  Cannabis Use Disorder - Moderate 9304.30) Depressive Disorders:  Major Depressive Disorder - with Psychotic Features (296.24)  Axis I: Paranoid Schizophrenia           Cannabis use disorder   ADL's:  Intact  Sleep: Fair  Appetite:  Fair  Suicidal Ideation: yes Plan:  denies  Intent:  denies Means:  denies Homicidal Ideation: denies   AEB (as evidenced by):  Psychiatric Specialty Exam: Review of Systems  Constitutional: Positive for malaise/fatigue.  HENT: Negative.   Eyes: Negative.   Respiratory: Negative.   Cardiovascular: Negative.   Gastrointestinal: Negative.   Genitourinary: Negative.   Musculoskeletal: Positive for joint pain and myalgias.  Skin: Negative.   Neurological: Negative.   Endo/Heme/Allergies: Negative.   Psychiatric/Behavioral: Positive for depression, suicidal ideas, hallucinations and substance abuse. The patient is nervous/anxious and has insomnia.     Blood pressure 96/67, pulse 91, temperature 97.9 F (36.6 C), temperature source Oral, resp. rate 18, height 5\' 1"  (1.549 m), weight 49.896 kg (110 lb), last menstrual period 04/12/2011.Body mass  index is 20.8 kg/(m^2).  General Appearance: Fairly Groomed  Patent attorney::  Minimal  Speech:  Slow  Volume:  Decreased  Mood:  Anxious and Depressed  Affect:  Constricted  Thought Process:  Linear  Orientation:  Full (Time, Place, and Person)  Thought Content:  Delusions and Hallucinations: Auditory  Suicidal Thoughts:  Yes.  without intent/plan  Homicidal Thoughts:  No  Memory:  Immediate;   Fair Recent;   Fair Remote;   Fair  Judgement:  Poor  Insight:  Lacking  Psychomotor Activity:  Decreased  Concentration:  Fair  Recall:  Fair  Akathisia:  No  Handed:  Right  AIMS (if indicated):     Assets:  Communication Skills Desire for Improvement Social Support  Sleep:  Number of Hours: 6.5   Current Medications: Current Facility-Administered Medications  Medication Dose Route Frequency Provider Last Rate Last Dose  . acetaminophen (TYLENOL) tablet 650 mg  650 mg Oral Q6H PRN Alix Stowers      . albuterol (PROVENTIL HFA;VENTOLIN HFA) 108 (90 BASE) MCG/ACT inhaler 2 puff  2 puff Inhalation Q6H PRN Fransisca Kaufmann, NP      . alum & mag hydroxide-simeth (MAALOX/MYLANTA) 200-200-20 MG/5ML suspension 30 mL  30 mL Oral Q4H PRN Benjaman Pott, MD      . Melene Muller ON 03/01/2013] citalopram (CELEXA) tablet 20 mg  20 mg Oral Daily Falon Huesca      . haloperidol (HALDOL) tablet 5 mg  5 mg Oral BID Deverick Pruss      . hydrOXYzine (ATARAX/VISTARIL) tablet 25 mg  25 mg Oral BID PC Bryley Kovacevic      . influenza vac split quadrivalent PF (FLUARIX) injection 0.5 mL  0.5 mL Intramuscular Tomorrow-1000 Eusebio Blazejewski      . magnesium hydroxide (MILK OF MAGNESIA) suspension 30 mL  30 mL Oral Daily PRN Benjaman Pott, MD      . OLANZapine zydis (ZYPREXA) disintegrating tablet 5 mg  5 mg Oral Q8H PRN Ota Ebersole      . traZODone (DESYREL) tablet 50 mg  50 mg Oral QHS PRN Fransisca Kaufmann, NP        Lab Results:  Results for orders placed during the hospital encounter of 02/27/13 (from the  past 48 hour(s))  COMPREHENSIVE METABOLIC PANEL     Status: Abnormal   Collection Time    02/28/13  6:35 AM      Result Value Range   Sodium 134 (*) 135 - 145 mEq/L   Potassium 3.9  3.5 - 5.1 mEq/L   Chloride 100  96 - 112 mEq/L   CO2 27  19 - 32 mEq/L   Glucose, Bld 85  70 - 99 mg/dL   BUN 10  6 - 23 mg/dL   Creatinine, Ser 1.61  0.50 - 1.10 mg/dL   Calcium 9.5  8.4 - 09.6 mg/dL   Total Protein 7.5  6.0 - 8.3 g/dL   Albumin 3.8  3.5 - 5.2 g/dL   AST 16  0 - 37 U/L   ALT 14  0 - 35 U/L   Alkaline Phosphatase 54  39 - 117 U/L   Total Bilirubin 0.5  0.3 - 1.2 mg/dL   GFR calc non Af Amer >90  >90 mL/min   GFR calc Af Amer >90  >90 mL/min   Comment: (NOTE)     The eGFR has been calculated using the CKD EPI equation.     This calculation has not been validated in all clinical situations.     eGFR's persistently <90 mL/min signify possible Chronic Kidney     Disease.     Performed at [facility]    Physical Findings: AIMS: Facial and Oral Movements Muscles of Facial Expression: None, normal Lips and Perioral Area: None, normal Jaw: None, normal Tongue: None, normal,Extremity Movements Upper (arms, wrists, hands, fingers): None, normal Lower (legs, knees, ankles, toes): None, normal, Trunk Movements Neck, shoulders, hips: None, normal, Overall Severity Severity of abnormal movements (highest score from questions above): None, normal Incapacitation due to abnormal movements: None, normal Patient's awareness of abnormal movements (rate only patient's report): No Awareness, Dental Status Current problems with teeth and/or dentures?: No Does patient usually wear dentures?: No  CIWA:    COWS:     Treatment Plan Summary: Daily contact with patient to assess and evaluate symptoms and progress in treatment Medication management  Plan:1. Admit for crisis management and stabilization. 2. Medication management to reduce current symptoms to base line and improve  the     patient's overall level of functioning 3. Treat health problems as indicated. 4. Develop treatment plan to decrease risk of relapse upon discharge and the need for     readmission. 5. Psycho-social education regarding relapse prevention and self care. 6. Health care follow up as needed for medical problems. 7. Restart home medications where appropriate. 8. Increase Haldol to 5mg  po bid for psychosis/delusion 9. Increase Celexa to 20mg  po daily for depression.  Medical Decision Making Problem Points:  Established problem, stable/improving (1), Review of last therapy session (1) and Review of psycho-social stressors (1) Data Points:  Order Aims Assessment (2) Review of medication regiment & side effects (2) Review  of new medications or change in dosage (2)  I certify that inpatient services furnished can reasonably be expected to improve the patient's condition.   Thedore Mins, MD 02/28/2013, 10:44 AM

## 2013-02-28 NOTE — Progress Notes (Signed)
Patient ID: KHALEAH DUER, female   DOB: [birth year], 37 y.o.   MRN: [id number] D: Pt is awake and active on the unit this PM. Pt endorses passive SI and AH with voices telling her to harm herself, but she is able to contract for safety. Pt rates their hopelessness at 8. Pt's mood is anxious and her affect is flat. Pt is somewhat isolative but does come out of her room periodically, although she interacts very little. Pt is pleasant with staff and peers. Pt conceals her face when sitting in the dayroom and appears to be suspicious of others. Pt verbalizes needs and requested prn medication.   A: Encouraged pt to discuss feelings with staff and administered medication per MD orders. Writer also encouraged pt to participate in groups.  R: Writer will continue to monitor. 15 minute checks are ongoing for safety.

## 2013-02-28 NOTE — [facility] Group Notes (Signed)
[facility] Group Notes:  (Counselor/Nursing/MHT/Case Management/Adjunct)  02/28/2013 1:15PM  Type of Therapy:  Group Therapy  Participation Level:  Did not attend    Summary of Progress/Problems: The topic for group was balance in life.  Pt participated in the discussion about when their life was in balance and out of balance and how this feels.  Pt discussed ways to get back in balance and short term goals they can work on to get where they want to be.    Daryel Gerald B 02/28/2013 1:25 PM

## 2013-02-28 NOTE — [facility] Suicide Risk Assessment (Signed)
[facility] INPATIENT:  Family/Significant Other Suicide Prevention Education  Suicide Prevention Education:  Education Completed; Daughter, Pierre Bali [phone] has been identified by the patient as the family member/significant other with whom the patient will be residing, and identified as the person(s) who will aid the patient in the prior event of a mental health crisis (suicidal ideations/suicide attempt).  With written consent from the patient, the family member/significant other has been provided the following suicide prevention education, to the and/or following the discharge of the patient.   The suicide prevention education provided includes the following:  Suicide risk factors  Suicide prevention and interventions  National Suicide Hotline telephone number  [facility] assessment telephone number  [facility] Emergency Assistance 911  [facility] and/or Residential Mobile Crisis Unit telephone number  Request made of family/significant other to:  Remove weapons (e.g., guns, rifles, knives), all items previously/currently identified as safety concern.    Remove drugs/medications (over-the-counter, prescriptions, illicit drugs), all items previously/currently identified as a safety concern.  The family member/significant other verbalizes understanding of the suicide prevention education information provided.  The family member/significant other agrees to remove the items of safety concern listed above.  Simona Huh 02/28/2013, 10:06 AM

## 2013-03-01 DIAGNOSIS — F1210 Cannabis abuse, uncomplicated: Secondary | ICD-10-CM

## 2013-03-01 DIAGNOSIS — F23 Brief psychotic disorder: Secondary | ICD-10-CM

## 2013-03-01 DIAGNOSIS — F200 Paranoid schizophrenia: Secondary | ICD-10-CM

## 2013-03-01 DIAGNOSIS — F323 Major depressive disorder, single episode, severe with psychotic features: Secondary | ICD-10-CM

## 2013-03-01 NOTE — Progress Notes (Signed)
Patient ID: Valerie Lee, female   DOB: [birth year], 37 y.o.   MRN: [id number] D. The patient has a depressed mood and anxious affect. Engaged in self soothing by wrapping in a blanket sucking her thumb and rubbing ear during group. Stated she had a good day because she spoke with her parents. Reports still hearing voices, but they are not as bothersome . A. Encouraged to attend evening wrap up group. Asked about her plan for recovery and what she felt would be important to prevent relapse. Administered PRN sleep medication. R. Attended and participated in evening group. Feels she is getting better. Stated the one thing she knows that is important for her to prevent relapse is to stay on her medication.

## 2013-03-01 NOTE — Progress Notes (Signed)
[facility] Group Notes:  (Nursing/MHT/Case Management/Adjunct)  Date:  03/01/2013  Time:  8:00 p.m.   Type of Therapy:  Psychoeducational Skills  Participation Level:  Minimal  Participation Quality:  Attentive  Affect:  Blunted  Cognitive:  Appropriate  Insight:  Appropriate  Engagement in Group:  Limited  Modes of Intervention:  Education  Summary of Progress/Problems: The patient shared in group this evening that her day was "ok".  She states that she had a good talk with her mother and father. As a theme for the day, the patient intends to stay on her medication as a relapse prevention.   [name] S 03/01/2013, 8:57 PM

## 2013-03-01 NOTE — [facility] Group Notes (Signed)
[facility] LCSW Aftercare Discharge Planning Group Note   03/01/2013 9:32 AM  Participation Quality:  Did not attend    [place]

## 2013-03-01 NOTE — Progress Notes (Signed)
D: Patient denies SI/HI and visual hallucinations. Patient is positive for auditory hallucinations and states that "The voices I hear are getting a little quieter." The patient wrote on her self-inventory "I want to die." When clarifying this with patient, the patient stated "I really don't have those thoughts since I've been here." Patient was able to contract for safety with RN in case those thoughts emerged. The patient is attending groups and interacting appropriately within the milieu.  A: Patient given emotional support from RN. Patient encouraged to come to staff with concerns and/or questions. Patient's medication routine continued. Patient's orders and plan of care reviewed.  R: Patient remains cooperative. Will continue to monitor patient q15 minutes for safety.

## 2013-03-01 NOTE — Progress Notes (Signed)
Patient ID: Valerie Lee, female   DOB: Aug 22, 1975, 37 y.o.   MRN: [id number] [facility] MD Progress Note  03/01/2013 12:07 PM KYLAR FAHL  MRN:  [id number] Subjective: Patient states "I don't want to be alive. I hate that I'm alive. I am useless to people. I am very anxious. The only improvement I can tell is that I'm not hearing the voices as often. I know the MD just increased my medications and that takes time. But I'm just tired. I would hurt myself if I was not here but I'm not going to tell you how."   Objective:   Patient continues to be isolative on the unit endorsing symptoms of psychosis, anxiety, and depression. She is often observed covering up with a blanket reporting this helps shield her from other people as the patient is paranoid about being harmed. The patient is reporting some mild improvement in her symptoms since medication changes were made yesterday. Patient agrees to make an effort to come out of her room more stating "I promise I will try."   Diagnosis:   DSM5: Schizophrenia Disorders:  Delusional Disorder (297.1) and Brief Psychotic Disorder (298.8) Obsessive-Compulsive Disorders:   Trauma-Stressor Disorders:   Substance/Addictive Disorders:  Cannabis Use Disorder - Moderate 9304.30) Depressive Disorders:  Major Depressive Disorder - with Psychotic Features (296.24)  Axis I: Paranoid Schizophrenia           Cannabis use disorder  ADL's:  Intact  Sleep: Good  Appetite:  Fair  Suicidal Ideation:  Passive SI with plan with will not disclose  Homicidal Ideation:  Denies AEB (as evidenced by):  Psychiatric Specialty Exam: Review of Systems  Constitutional: Positive for malaise/fatigue.  HENT: Negative.   Eyes: Negative.   Respiratory: Negative.   Cardiovascular: Negative.   Gastrointestinal: Negative.   Genitourinary: Negative.   Musculoskeletal: Positive for joint pain.  Skin: Negative.   Neurological: Negative.   Endo/Heme/Allergies:  Negative.   Psychiatric/Behavioral: Positive for depression, suicidal ideas, hallucinations and substance abuse. The patient is nervous/anxious and has insomnia.     Blood pressure 101/69, pulse 86, temperature 98.4 F (36.9 C), temperature source Oral, resp. rate 18, height 5\' 1"  (1.549 m), weight 49.896 kg (110 lb), last menstrual period 04/12/2011.Body mass index is 20.8 kg/(m^2).  General Appearance: Fairly Groomed  Patent attorney::  Minimal  Speech:  Slow  Volume:  Decreased  Mood:  Anxious and Depressed  Affect:  Constricted  Thought Process:  Linear  Orientation:  Full (Time, Place, and Person)  Thought Content:  Delusions and Hallucinations: Auditory  Suicidal Thoughts:  Yes.  with intent/plan  Homicidal Thoughts:  No  Memory:  Immediate;   Fair Recent;   Fair Remote;   Fair  Judgement:  Poor  Insight:  Lacking  Psychomotor Activity:  Decreased  Concentration:  Fair  Recall:  Fair  Akathisia:  No  Handed:  Right  AIMS (if indicated):     Assets:  Communication Skills Desire for Improvement Social Support  Sleep:  Number of Hours: 6.75   Current Medications: Current Facility-Administered Medications  Medication Dose Route Frequency Provider Last Rate Last Dose  . acetaminophen (TYLENOL) tablet 650 mg  650 mg Oral Q6H PRN Mojeed Akintayo   650 mg at 02/28/13 1623  . albuterol (PROVENTIL HFA;VENTOLIN HFA) 108 (90 BASE) MCG/ACT inhaler 2 puff  2 puff Inhalation Q6H PRN Fransisca Kaufmann, NP      . alum & mag hydroxide-simeth (MAALOX/MYLANTA) 200-200-20 MG/5ML suspension 30 mL  30 mL Oral  Q4H PRN Benjaman Pott, MD      . citalopram (CELEXA) tablet 20 mg  20 mg Oral Daily Mojeed Akintayo   20 mg at 03/01/13 0754  . docusate sodium (COLACE) capsule 100 mg  100 mg Oral BID Mojeed Akintayo   100 mg at 03/01/13 0755  . haloperidol (HALDOL) tablet 5 mg  5 mg Oral BID Mojeed Akintayo   5 mg at 03/01/13 0754  . hydrOXYzine (ATARAX/VISTARIL) tablet 25 mg  25 mg Oral BID PC Mojeed Akintayo    25 mg at 03/01/13 0755  . influenza vac split quadrivalent PF (FLUARIX) injection 0.5 mL  0.5 mL Intramuscular Tomorrow-1000 Mojeed Akintayo      . magnesium hydroxide (MILK OF MAGNESIA) suspension 30 mL  30 mL Oral Daily PRN Benjaman Pott, MD      . OLANZapine zydis (ZYPREXA) disintegrating tablet 5 mg  5 mg Oral Q8H PRN Mojeed Akintayo      . traZODone (DESYREL) tablet 50 mg  50 mg Oral QHS PRN Fransisca Kaufmann, NP        Lab Results:  Results for orders placed during the hospital encounter of 02/27/13 (from the past 48 hour(s))  COMPREHENSIVE METABOLIC PANEL     Status: Abnormal   Collection Time    02/28/13  6:35 AM      Result Value Range   Sodium 134 (*) 135 - 145 mEq/L   Potassium 3.9  3.5 - 5.1 mEq/L   Chloride 100  96 - 112 mEq/L   CO2 27  19 - 32 mEq/L   Glucose, Bld 85  70 - 99 mg/dL   BUN 10  6 - 23 mg/dL   Creatinine, Ser 1.61  0.50 - 1.10 mg/dL   Calcium 9.5  8.4 - 09.6 mg/dL   Total Protein 7.5  6.0 - 8.3 g/dL   Albumin 3.8  3.5 - 5.2 g/dL   AST 16  0 - 37 U/L   ALT 14  0 - 35 U/L   Alkaline Phosphatase 54  39 - 117 U/L   Total Bilirubin 0.5  0.3 - 1.2 mg/dL   GFR calc non Af Amer >90  >90 mL/min   GFR calc Af Amer >90  >90 mL/min   Comment: (NOTE)     The eGFR has been calculated using the CKD EPI equation.     This calculation has not been validated in all clinical situations.     eGFR's persistently <90 mL/min signify possible Chronic Kidney     Disease.     Performed at [facility]    Physical Findings: AIMS: Facial and Oral Movements Muscles of Facial Expression: None, normal Lips and Perioral Area: None, normal Jaw: None, normal Tongue: None, normal,Extremity Movements Upper (arms, wrists, hands, fingers): None, normal Lower (legs, knees, ankles, toes): None, normal, Trunk Movements Neck, shoulders, hips: None, normal, Overall Severity Severity of abnormal movements (highest score from questions above): None, normal Incapacitation  due to abnormal movements: None, normal Patient's awareness of abnormal movements (rate only patient's report): No Awareness, Dental Status Current problems with teeth and/or dentures?: No Does patient usually wear dentures?: No  CIWA:    COWS:     Treatment Plan Summary: Daily contact with patient to assess and evaluate symptoms and progress in treatment Medication management  Plan:1. Continue crisis management and stabilization. 2. Medication management to reduce current symptoms to base line and improve the patient's overall level of functioning 3. Treat health problems as  indicated. 4. Develop treatment plan to decrease risk of relapse upon discharge and the need for readmission. 5. Psycho-social education regarding relapse prevention and self care. 6. Health care follow up as needed for medical problems. 7. Restart home medications where appropriate. 8. Continue Haldol to 5mg  po bid for psychosis/delusion 9. Continue Celexa to 20mg  po daily for depression.  Medical Decision Making Problem Points:  Established problem, stable/improving (1), Review of last therapy session (1) and Review of psycho-social stressors (1) Data Points:  Order Aims Assessment (2) Review of medication regiment & side effects (2) Review of new medications or change in dosage (2)  I certify that inpatient services furnished can reasonably be expected to improve the patient's condition.   Fransisca Kaufmann, NP-C 03/01/2013, 12:07 PM

## 2013-03-01 NOTE — Progress Notes (Signed)
Patient ID: Valerie Lee, female   DOB: 1975/04/29, 37 y.o.   MRN: [id number] D: pt. Lying in bed, eyes closed, respiration even. A: Writer assessed for s/s of distress. Staff will monitor q40min for safety. R: Pt. Is safe on the unit, no distress noted.

## 2013-03-01 NOTE — Care Management Utilization Note (Signed)
   Per State Regulation 482.30  This chart was reviewed for necessity with respect to the patient's Admission/ Duration of stay.  Next review date: 03/04/13   Nicolasa Ducking RN, BSN

## 2013-03-01 NOTE — [facility] Group Notes (Signed)
[facility] LCSW Group Therapy  03/01/2013  1:05 PM  Type of Therapy:  Group therapy  Participation Level:  Active  Participation Quality:  Attentive  Affect:  Flat  Cognitive:  Oriented  Insight:  Limited  Engagement in Therapy:  Limited  Modes of Intervention:  Discussion, Socialization  Summary of Progress/Problems:  Chaplain was here to lead a group on themes of hope and courage. Eulonda was initially engaged and talked about her family and how they only love her conditionally.  Went on to talk about her best friend that does give her unconditional love.  Abruptly left after 10 minutes and did not come back. Daryel Gerald B 03/01/2013 1:32 PM

## 2013-03-02 DIAGNOSIS — F1220 Cannabis dependence, uncomplicated: Secondary | ICD-10-CM

## 2013-03-02 NOTE — Progress Notes (Signed)
[facility] Group Notes:  (Nursing/MHT/Case Management/Adjunct)  Date:  03/02/2013  Time:  8:00 p.m.   Type of Therapy:  Psychoeducational Skills  Participation Level:  Active  Participation Quality:  Appropriate  Affect:  Appropriate  Cognitive:  Appropriate  Insight:  Appropriate  Engagement in Group:  Developing/Improving  Modes of Intervention:  Education  Summary of Progress/Problems: The patient described her day as having been "okay". She attributed her good day to having a positive visit with her brother and because the voices have decreased. In terms of the theme of the day, she mentioned that she will be taking her medicine and calling her pastor as a coping skill.   [name] S 03/02/2013, 9:41 PM

## 2013-03-02 NOTE — Progress Notes (Signed)
Patient ID: VENECIA MEHL, female   DOB: 19-May-1975, 37 y.o.   MRN: [id number] Psychoeducational Group Note  Date:  03/02/2013 Time:0930am  Group Topic/Focus:  Identifying Needs:   The focus of this group is to help patients identify their personal needs that have been historically problematic and identify healthy behaviors to address their needs.  Participation Level:  Active  Participation Quality:  Appropriate  Affect:  Appropriate  Cognitive:  Appropriate  Insight:  Supportive  Engagement in Group:  Supportive  Additional Comments:  Inventory and Psychoeducational group   Valente David 03/02/2013,10:11 AM

## 2013-03-02 NOTE — [facility] Group Notes (Signed)
[facility] Group Notes:  (Clinical Social Work)  03/02/2013  11:15-11:45AM  Summary of Progress/Problems:   The main focus of today's process group was for the patient to identify ways in which they have in the past sabotaged their own recovery and reasons they may have done this/what they received from doing it.  We then worked to identify a specific plan to avoid doing this when discharged from the hospital for this admission.  The patient expressed that she is in the hospital with auditory/visual hallucinations as well as suicidal thoughts, and said she self-sabotages by not taking her medications because she thinks she is well, or because she is at someone else's house and does not want to take a number of medications in front of them and be ridiculed for it.  She wants to go back home, take care of her 6 children aged 65 to 70, and not return to the hospital, which she states will necessitate staying on her medication, no matter what.  Type of Therapy:  Group Therapy - Process  Participation Level:  Active  Participation Quality:  Attentive, Sharing and Supportive  Affect:  Anxious and Blunted  Cognitive:  Appropriate and Oriented  Insight:  Engaged  Engagement in Therapy:  Engaged  Modes of Intervention:  Clarification, Education, Exploration, Discussion  Ambrose Mantle, LCSW 03/02/2013, 12:54 PM

## 2013-03-02 NOTE — Progress Notes (Signed)
Patient ID: Valerie Lee, female   DOB: October 20, 1975, 37 y.o.   MRN: [id number] [facility] MD Progress Note  03/02/2013 10:20 AM CINDY-LOU ABRAMO  MRN:  [id number] Subjective: Patient states paranoia and ongoing hallucinations. Says to be get frustrated easily and guarded of what people are asking her.  Objective:   Patient continues to endorse paranoia and isolative behaviour.  Diagnosis:   DSM5: Schizophrenia Disorders:  Delusional Disorder (297.1) and Brief Psychotic Disorder (298.8) Obsessive-Compulsive Disorders:   Trauma-Stressor Disorders:   Substance/Addictive Disorders:  Cannabis Use Disorder - Moderate 9304.30) Depressive Disorders:  Major Depressive Disorder - with Psychotic Features (296.24)  Axis I: Paranoid Schizophrenia           Cannabis use disorder  ADL's:  Intact  Sleep: Good  Appetite:  Fair  Suicidal Ideation:  Passive SI with plan with will not disclose  Homicidal Ideation:  Denies AEB (as evidenced by):  Psychiatric Specialty Exam: Review of Systems  Constitutional: Positive for malaise/fatigue.  HENT: Negative.   Eyes: Negative.   Respiratory: Negative.   Cardiovascular: Negative.   Gastrointestinal: Negative.   Genitourinary: Negative.   Musculoskeletal: Positive for joint pain.  Skin: Negative.   Neurological: Negative.   Endo/Heme/Allergies: Negative.   Psychiatric/Behavioral: Positive for depression, suicidal ideas, hallucinations and substance abuse. The patient is nervous/anxious and has insomnia.     Blood pressure 113/71, pulse 88, temperature 98.2 F (36.8 C), temperature source Oral, resp. rate 16, height 5\' 1"  (1.549 m), weight 49.896 kg (110 lb), last menstrual period 04/12/2011.Body mass index is 20.8 kg/(m^2).  General Appearance: Fairly Groomed  Patent attorney::  Minimal  Speech:  Slow  Volume:  Decreased  Mood:  Anxious and Depressed  Affect:  Constricted  Thought Process:  Linear  Orientation:  Full (Time, Place, and Person)   Thought Content:  Delusions and Hallucinations: Auditory  Suicidal Thoughts:  Yes.  with intent/plan  Homicidal Thoughts:  No  Memory:  Immediate;   Fair Recent;   Fair Remote;   Fair  Judgement:  Poor  Insight:  Lacking  Psychomotor Activity:  Decreased  Concentration:  Fair  Recall:  Fair  Akathisia:  No  Handed:  Right  AIMS (if indicated):     Assets:  Communication Skills Desire for Improvement Social Support  Sleep:  Number of Hours: 6.5   Current Medications: Current Facility-Administered Medications  Medication Dose Route Frequency Provider Last Rate Last Dose  . acetaminophen (TYLENOL) tablet 650 mg  650 mg Oral Q6H PRN Mojeed Akintayo   650 mg at 03/01/13 1623  . albuterol (PROVENTIL HFA;VENTOLIN HFA) 108 (90 BASE) MCG/ACT inhaler 2 puff  2 puff Inhalation Q6H PRN Fransisca Kaufmann, NP      . alum & mag hydroxide-simeth (MAALOX/MYLANTA) 200-200-20 MG/5ML suspension 30 mL  30 mL Oral Q4H PRN Benjaman Pott, MD      . citalopram (CELEXA) tablet 20 mg  20 mg Oral Daily Mojeed Akintayo   20 mg at 03/02/13 0820  . docusate sodium (COLACE) capsule 100 mg  100 mg Oral BID Mojeed Akintayo   100 mg at 03/02/13 0820  . haloperidol (HALDOL) tablet 5 mg  5 mg Oral BID Mojeed Akintayo   5 mg at 03/02/13 0820  . hydrOXYzine (ATARAX/VISTARIL) tablet 25 mg  25 mg Oral BID PC Mojeed Akintayo   25 mg at 03/02/13 0820  . influenza vac split quadrivalent PF (FLUARIX) injection 0.5 mL  0.5 mL Intramuscular Tomorrow-1000 Mojeed Akintayo      .  magnesium hydroxide (MILK OF MAGNESIA) suspension 30 mL  30 mL Oral Daily PRN Benjaman Pott, MD      . OLANZapine zydis (ZYPREXA) disintegrating tablet 5 mg  5 mg Oral Q8H PRN Mojeed Akintayo      . traZODone (DESYREL) tablet 50 mg  50 mg Oral QHS PRN Fransisca Kaufmann, NP   50 mg at 03/01/13 2120    Lab Results:  No results found for this or any previous visit (from the past 48 hour(s)).  Physical Findings: AIMS: Facial and Oral Movements Muscles of  Facial Expression: None, normal Lips and Perioral Area: None, normal Jaw: None, normal Tongue: None, normal,Extremity Movements Upper (arms, wrists, hands, fingers): None, normal Lower (legs, knees, ankles, toes): None, normal, Trunk Movements Neck, shoulders, hips: None, normal, Overall Severity Severity of abnormal movements (highest score from questions above): None, normal Incapacitation due to abnormal movements: None, normal Patient's awareness of abnormal movements (rate only patient's report): No Awareness, Dental Status Current problems with teeth and/or dentures?: No Does patient usually wear dentures?: No  CIWA:    COWS:     Treatment Plan Summary: Daily contact with patient to assess and evaluate symptoms and progress in treatment Medication management  Plan:1. Continue crisis management and stabilization. 2. Medication management to reduce current symptoms to base line and improve the patient's overall level of functioning 3. Treat health problems as indicated. 4. Develop treatment plan to decrease risk of relapse upon discharge and the need for readmission. 5. Psycho-social education regarding relapse prevention and self care. 6. Health care follow up as needed for medical problems. 7. Restart home medications where appropriate. 8. Continue Haldol to 5mg  po bid for psychosis/delusion 9. Continue Celexa to 20mg  po daily for depression.  Medical Decision Making Problem Points:  Established problem, stable/improving (1), Review of last therapy session (1) and Review of psycho-social stressors (1) Data Points  I certify that inpatient services furnished can reasonably be expected to improve the patient's condition.   Zaeden Lastinger, NP-C 03/02/2013, 10:20 AM

## 2013-03-02 NOTE — Progress Notes (Signed)
Patient ID: Valerie Lee, female   DOB: 1976-03-19, 37 y.o.   MRN: [id number] 03-02-13 nursing shift note: D: pt has been visible in the milieu, coming to groups, taking her medications and has not had any physical complaints today. A: she states she has been having auditory hallucinations and some passive SI. R: the pt is able to contract verbally for the SI. on her inventory sheet she wrote: slept well, appetite good, energy low, attention improving with her depression and hopelessness both a 6. Physical problems have been headaches in the last 24 hrs.. After discharge pt plans to ; "remember to take my med's all the time even if I think I am doing better". Also she stated " I am having an ok day today, getting better". RN will continue to monitor and Q 15 min ck's continue.

## 2013-03-03 MED ORDER — ACETAMINOPHEN 500 MG PO TABS
1000.0000 mg | ORAL_TABLET | Freq: Four times a day (QID) | ORAL | Status: DC | PRN
  Administered 2013-03-03 – 2013-03-07 (×7): 1000 mg via ORAL
  Filled 2013-03-03 (×7): qty 2

## 2013-03-03 NOTE — Progress Notes (Signed)
Found pt resting in bed, sucking her thumb. Awakened easily. Pt pleasant. States mood is improved greatly and only complaint is lower back pain of a 10/10. States voices have ceased. No VH. Pt medicated with tylenol and given heat packs. Snack and fluids also given. Support offered. She denies SI/HI and remains safe. Valerie Lee

## 2013-03-03 NOTE — Progress Notes (Signed)
Patient ID: Valerie Lee, female   DOB: 1975/10/20, 37 y.o.   MRN: [id number] [facility] MD Progress Note  03/03/2013 10:24 AM Valerie Lee  MRN:  [id number] Subjective: Patient lying in bed but less paranoid. Feels better still isolating herself. Objective:   Alert oriented, endorses paranoia. Tolerating medications.  Diagnosis:   DSM5: Schizophrenia Disorders:  Delusional Disorder (297.1) and Brief Psychotic Disorder (298.8) Obsessive-Compulsive Disorders:   Trauma-Stressor Disorders:   Substance/Addictive Disorders:  Cannabis Use Disorder - Moderate 9304.30) Depressive Disorders:  Major Depressive Disorder - with Psychotic Features (296.24)  Axis I: Paranoid Schizophrenia           Cannabis use disorder  ADL's:  Intact  Sleep: Good  Appetite:  Fair  Suicidal Ideation:  Passive SI with plan with will not disclose  Homicidal Ideation:  Denies AEB (as evidenced by):  Psychiatric Specialty Exam: Review of Systems  Constitutional: Positive for malaise/fatigue.  HENT: Negative.   Eyes: Negative.   Respiratory: Negative.   Cardiovascular: Negative.   Gastrointestinal: Negative.   Genitourinary: Negative.   Musculoskeletal: Positive for joint pain.  Skin: Negative.   Neurological: Negative.   Endo/Heme/Allergies: Negative.   Psychiatric/Behavioral: Positive for depression, suicidal ideas, hallucinations and substance abuse. The patient is nervous/anxious and has insomnia.     Blood pressure 94/62, pulse 84, temperature 98.5 F (36.9 C), temperature source Oral, resp. rate 18, height 5\' 1"  (1.549 m), weight 49.896 kg (110 lb), last menstrual period 04/12/2011.Body mass index is 20.8 kg/(m^2).  General Appearance: Fairly Groomed  Patent attorney::  Minimal  Speech:  Slow  Volume:  Decreased  Mood:  Anxious and Depressed  Affect:  Constricted  Thought Process:  Linear  Orientation:  Full (Time, Place, and Person)  Thought Content:  Delusions and Hallucinations:  Auditory  Suicidal Thoughts:  Yes without intent  Homicidal Thoughts:  No  Memory:  Immediate;   Fair Recent;   Fair Remote;   Fair  Judgement:  Poor  Insight:  Lacking  Psychomotor Activity:  Decreased  Concentration:  Fair  Recall:  Fair  Akathisia:  No  Handed:  Right  AIMS (if indicated):     Assets:  Communication Skills Desire for Improvement Social Support  Sleep:  Number of Hours: 6.75   Current Medications: Current Facility-Administered Medications  Medication Dose Route Frequency Provider Last Rate Last Dose  . acetaminophen (TYLENOL) tablet 650 mg  650 mg Oral Q6H PRN Mojeed Akintayo   650 mg at 03/03/13 0749  . albuterol (PROVENTIL HFA;VENTOLIN HFA) 108 (90 BASE) MCG/ACT inhaler 2 puff  2 puff Inhalation Q6H PRN Fransisca Kaufmann, NP      . alum & mag hydroxide-simeth (MAALOX/MYLANTA) 200-200-20 MG/5ML suspension 30 mL  30 mL Oral Q4H PRN Benjaman Pott, MD      . citalopram (CELEXA) tablet 20 mg  20 mg Oral Daily Mojeed Akintayo   20 mg at 03/03/13 0749  . docusate sodium (COLACE) capsule 100 mg  100 mg Oral BID Mojeed Akintayo   100 mg at 03/03/13 0749  . haloperidol (HALDOL) tablet 5 mg  5 mg Oral BID Mojeed Akintayo   5 mg at 03/03/13 0749  . hydrOXYzine (ATARAX/VISTARIL) tablet 25 mg  25 mg Oral BID PC Mojeed Akintayo   25 mg at 03/03/13 0749  . influenza vac split quadrivalent PF (FLUARIX) injection 0.5 mL  0.5 mL Intramuscular Tomorrow-1000 Mojeed Akintayo      . magnesium hydroxide (MILK OF MAGNESIA) suspension 30 mL  30 mL  Oral Daily PRN Benjaman Pott, MD      . OLANZapine zydis [facility]) disintegrating tablet 5 mg  5 mg Oral Q8H PRN Mojeed Akintayo      . traZODone (DESYREL) tablet 50 mg  50 mg Oral QHS PRN Fransisca Kaufmann, NP   50 mg at 03/02/13 2115    Lab Results:  No results found for this or any previous visit (from the past 48 hour(s)).  Physical Findings: AIMS: Facial and Oral Movements Muscles of Facial Expression: None, normal Lips and Perioral Area:  None, normal Jaw: None, normal Tongue: None, normal,Extremity Movements Upper (arms, wrists, hands, fingers): None, normal Lower (legs, knees, ankles, toes): None, normal, Trunk Movements Neck, shoulders, hips: None, normal, Overall Severity Severity of abnormal movements (highest score from questions above): None, normal Incapacitation due to abnormal movements: None, normal Patient's awareness of abnormal movements (rate only patient's report): No Awareness, Dental Status Current problems with teeth and/or dentures?: No Does patient usually wear dentures?: No  CIWA:    COWS:     Treatment Plan Summary: Daily contact with patient to assess and evaluate symptoms and progress in treatment Medication management  Plan:1. Continue crisis management and stabilization. 2. Medication management to reduce current symptoms to base line and improve the patient's overall level of functioning 3. Treat health problems as indicated. 4. Develop treatment plan to decrease risk of relapse upon discharge and the need for readmission. 5. Psycho-social education regarding relapse prevention and self care. 6. Health care follow up as needed for medical problems. 7. Restart home medications where appropriate. 8. Continue Haldol to 5mg  po bid for psychosis/delusion 9. Continue Celexa to 20mg  po daily for depression.  Medical Decision Making Problem Points:  Established problem, stable/improving (1), Review of last therapy session (1) and Review of psycho-social stressors (1) Data Points  I certify that inpatient services furnished can reasonably be expected to improve the patient's condition.   Thresa Ross, MD 03/03/2013, 10:24 AM

## 2013-03-03 NOTE — Progress Notes (Signed)
Psychoeducational Group Note  Date:  03/03/2013 Time:  8:00 p.m.   Group Topic/Focus:  Wrap-Up Group:   The focus of this group is to help patients review their daily goal of treatment and discuss progress on daily workbooks.  Participation Level: Did Not Attend  Participation Quality:  Not Applicable  Affect:  Not Applicable  Cognitive:  Not Applicable  Insight:  Not Applicable  Engagement in Group: Not Applicable  Additional Comments:  The patient did not attend group this evening since she was dealing with some back pain at that time.   [name] S 03/03/2013, 9:23 PM

## 2013-03-03 NOTE — Progress Notes (Signed)
Patient ID: Valerie Lee, female   DOB: Sep 13, 1975, 37 y.o.   MRN: [id number] 03-03-13 nursing shift note: D: pt has very pleasant today. She is coming to groups, taking her med's, but remains somewhat isolative to her room. She finished all the worksheets associated with the groups and gave them to the RN.  A: staff continues to support, encourage and praise. R: she denied any suicidal thoughts or self harm. On her inventory sheet she wrote: she requested sleep medication, appetite good, energy low, attention good with her depression at 6 and hopelessness at 5. No w/d symptoms. Physical pain has been back pain. After discharge she plans to "stay on her medications". RN will monitor and Q 15 min ck's continue.

## 2013-03-03 NOTE — [facility] Group Notes (Signed)
[facility] Group Notes:  (Clinical Social Work)  03/03/2013   11:15am-12:00pm  Summary of Progress/Problems:  The main focus of today's process group was to listen to a variety of genres of music and to identify that different types of music provoke different responses.  The patient then was able to identify personally what was soothing for them, as well as energizing.  Handouts were used to record feelings evoked, as well as how patient can personally use this knowledge in sleep habits, with depression, and with other symptoms.  The patient expressed understanding of concepts, as well as knowledge of how each type of music affected them and how this can be used when they are at home as a tool in their recovery.  Destinie was fully engaged in group and could identify her feelings readily.  Type of Therapy:  Music Therapy   Participation Level:  Active  Participation Quality:  Attentive and Sharing  Affect:  Blunted  Cognitive:  Oriented  Insight:  Engaged  Engagement in Therapy:  Engaged  Modes of Intervention:   Activity, Exploration  Ambrose Mantle, LCSW 03/03/2013, 12:30pm

## 2013-03-03 NOTE — Progress Notes (Signed)
Patient ID: Valerie Lee, female   DOB: 1976/03/04, 37 y.o.   MRN: [id number] D. The patient was more visible in the milieu this evening. She is a little brighter and reports the voices are starting to diminish. Was observed sucking her thumb during evening group.  A. Met with patient 1:1 to assess. Discussed her recovery plan. R. Stated that she knows she needs to take her medication to prevent relapse. Identified her pastor and her church as her support system.

## 2013-03-03 NOTE — Progress Notes (Signed)
Patient ID: VERLIN DUKE, female   DOB: 14-Jan-1976, 36 y.o.   MRN: [id number] Psychoeducational Group Note  Date:  03/03/2013 Time:  0930am  Group Topic/Focus:  Making Healthy Choices:   The focus of this group is to help patients identify negative/unhealthy choices they were using prior to admission and identify positive/healthier coping strategies to replace them upon discharge.  Participation Level:  Active  Participation Quality:  Appropriate  Affect:  Appropriate  Cognitive:  Appropriate  Insight:  Supportive  Engagement in Group:  Supportive  Additional Comments:  Inventory and Psychoeducational group - healthy support system   Valente David 03/03/2013,10:27 AM

## 2013-03-04 NOTE — Progress Notes (Signed)
Patient ID: Valerie Lee, female   DOB: Oct 09, 1975, 37 y.o.   MRN: [id number] [facility] MD Progress Note  03/04/2013 11:12 AM Valerie Lee  MRN:  [id number] Subjective: " I am doing much better except for occasional anxiety and back pain." Objective:  Patient reports decreased delusions, psychosis and depressive symptoms. However, she reports ongoing anxiety and subtle paranoid thinking. She denies suicidal/homicidal ideations, intent or plan. She is compliant with her medications and has not verbalized any adverse reactions. Diagnosis:   DSM5: Schizophrenia Disorders:  Delusional Disorder (297.1) and Brief Psychotic Disorder (298.8) Obsessive-Compulsive Disorders:   Trauma-Stressor Disorders:   Substance/Addictive Disorders:  Cannabis Use Disorder - Moderate 9304.30) Depressive Disorders:  Major Depressive Disorder - with Psychotic Features (296.24)  Axis I: Paranoid Schizophrenia           Cannabis use disorder   ADL's:  Intact  Sleep: Fair  Appetite:  Fair  Suicidal Ideation: yes Plan:  denies  Intent:  denies Means:  denies Homicidal Ideation: denies   AEB (as evidenced by):  Psychiatric Specialty Exam: Review of Systems  Constitutional: Positive for malaise/fatigue.  HENT: Negative.   Eyes: Negative.   Respiratory: Negative.   Cardiovascular: Negative.   Gastrointestinal: Negative.   Genitourinary: Negative.   Musculoskeletal: Positive for joint pain and myalgias.  Skin: Negative.   Neurological: Negative.   Endo/Heme/Allergies: Negative.   Psychiatric/Behavioral: Positive for depression, suicidal ideas, hallucinations and substance abuse. The patient is nervous/anxious and has insomnia.     Blood pressure 100/66, pulse 86, temperature 98.3 F (36.8 C), temperature source Oral, resp. rate 16, height 5\' 1"  (1.549 m), weight 49.896 kg (110 lb), last menstrual period 04/12/2011.Body mass index is 20.8 kg/(m^2).  General Appearance: Fairly Groomed  Proofreader::  Minimal  Speech:  normal  Volume:  Decreased  Mood:  Anxious and Depressed  Affect:  neutral  Thought Process:  Linear  Orientation:  Full (Time, Place, and Person)  Thought Content:  Delusions   Suicidal Thoughts:  denies  Homicidal Thoughts:  No  Memory:  Immediate;   Fair Recent;   Fair Remote;   Fair  Judgement:  Poor  Insight:  Lacking  Psychomotor Activity:  Decreased  Concentration:  Fair  Recall:  Fair  Akathisia:  No  Handed:  Right  AIMS (if indicated):     Assets:  Communication Skills Desire for Improvement Social Support  Sleep:  Number of Hours: 6   Current Medications: Current Facility-Administered Medications  Medication Dose Route Frequency Provider Last Rate Last Dose  . acetaminophen (TYLENOL) tablet 1,000 mg  1,000 mg Oral Q6H PRN Sanjuana Kava, NP   1,000 mg at 03/03/13 2034  . albuterol (PROVENTIL HFA;VENTOLIN HFA) 108 (90 BASE) MCG/ACT inhaler 2 puff  2 puff Inhalation Q6H PRN Fransisca Kaufmann, NP      . alum & mag hydroxide-simeth (MAALOX/MYLANTA) 200-200-20 MG/5ML suspension 30 mL  30 mL Oral Q4H PRN Benjaman Pott, MD      . citalopram (CELEXA) tablet 20 mg  20 mg Oral Daily Bahja Bence   20 mg at 03/04/13 0746  . docusate sodium (COLACE) capsule 100 mg  100 mg Oral BID Carmen Vallecillo   100 mg at 03/04/13 0746  . haloperidol (HALDOL) tablet 5 mg  5 mg Oral BID Raylyn Speckman   5 mg at 03/04/13 0746  . hydrOXYzine (ATARAX/VISTARIL) tablet 25 mg  25 mg Oral BID PC Mazie Fencl   25 mg at 03/04/13 0746  .  influenza vac split quadrivalent PF (FLUARIX) injection 0.5 mL  0.5 mL Intramuscular Tomorrow-1000 Carrah Eppolito      . magnesium hydroxide (MILK OF MAGNESIA) suspension 30 mL  30 mL Oral Daily PRN Benjaman Pott, MD      . OLANZapine zydis (ZYPREXA) disintegrating tablet 5 mg  5 mg Oral Q8H PRN Joshu Furukawa      . traZODone (DESYREL) tablet 50 mg  50 mg Oral QHS PRN Fransisca Kaufmann, NP   50 mg at 03/02/13 2115    Lab Results:  No  results found for this or any previous visit (from the past 48 hour(s)).  Physical Findings: AIMS: Facial and Oral Movements Muscles of Facial Expression: None, normal Lips and Perioral Area: None, normal Jaw: None, normal Tongue: None, normal,Extremity Movements Upper (arms, wrists, hands, fingers): None, normal Lower (legs, knees, ankles, toes): None, normal, Trunk Movements Neck, shoulders, hips: None, normal, Overall Severity Severity of abnormal movements (highest score from questions above): None, normal Incapacitation due to abnormal movements: None, normal Patient's awareness of abnormal movements (rate only patient's report): No Awareness, Dental Status Current problems with teeth and/or dentures?: No Does patient usually wear dentures?: No  CIWA:    COWS:     Treatment Plan Summary: Daily contact with patient to assess and evaluate symptoms and progress in treatment Medication management  Plan:1. Admit for crisis management and stabilization. 2. Medication management to reduce current symptoms to base line and improve the patient's overall level of functioning 3. Treat health problems as indicated. 4. Develop treatment plan to decrease risk of relapse upon discharge and the need for readmission. 5. Psycho-social education regarding relapse prevention and self care. 6. Health care follow up as needed for medical problems. 7. Restart home medications where appropriate. 8. Continue Haldol to 5mg  po bid for psychosis/delusion 9. Continue Celexa to 20mg  po daily for depression.  Medical Decision Making Problem Points:  Established problem, stable/improving (1), Review of last therapy session (1) and Review of psycho-social stressors (1) Data Points:  Order Aims Assessment (2) Review of medication regiment & side effects (2) Review of new medications or change in dosage (2)  I certify that inpatient services furnished can reasonably be expected to improve the patient's  condition.   Thedore Mins, MD 03/04/2013, 11:12 AM

## 2013-03-04 NOTE — Tx Team (Signed)
  Interdisciplinary Treatment Plan Update   Date Reviewed:  03/04/2013  Time Reviewed:  8:08 AM  Progress in Treatment:   Attending groups: Yes Participating in groups: Yes Taking medication as prescribed: Yes  Tolerating medication: Yes Family/Significant other contact made: Yes  Patient understands diagnosis: Yes  Discussing patient identified problems/goals with staff: Yes Medical problems stabilized or resolved: Yes Denies suicidal/homicidal ideation: Yes Patient has not harmed self or others: Yes  For review of initial/current patient goals, please see plan of care.  Estimated Length of Stay:  3-4 days  Reason for Continuation of Hospitalization: Anxiety Medication stabilization  New Problems/Goals identified:  N/A  Discharge Plan or Barriers:   return home, follow up outpt  Additional Comments:  Ryder denies psychosis today, feels like the medication she is getting is appropriate.  No concerns with side effects.  C/O mainly of pain and accompanying anxiety.  Likely d/c Thursday or Friday.  Attendees:  Signature: Thedore Mins, MD 03/04/2013 8:08 AM   Signature: Richelle Ito, LCSW 03/04/2013 8:08 AM  Signature: Fransisca Kaufmann, NP 03/04/2013 8:08 AM  Signature: Joslyn Devon, RN 03/04/2013 8:08 AM  Signature: Liborio Nixon, RN 03/04/2013 8:08 AM  Signature:  03/04/2013 8:08 AM  Signature:   03/04/2013 8:08 AM  Signature:    Signature:    Signature:    Signature:    Signature:    Signature:      Scribe for Treatment Team:   Richelle Ito, LCSW  03/04/2013 8:08 AM

## 2013-03-04 NOTE — ED Provider Notes (Signed)
Medical screening examination/treatment/procedure(s) were performed by non-physician practitioner and as supervising physician I was immediately available for consultation/collaboration.  EKG Interpretation   None          Christopher J. Pollina, MD 03/04/13 2332 

## 2013-03-04 NOTE — [facility] Group Notes (Signed)
[facility] LCSW Group Therapy  03/04/2013 1:15 pm  Type of Therapy: Process Group Therapy  Participation Level:  Active  Participation Quality:  Appropriate  Affect:  Flat  Cognitive:  Oriented  Insight:  Improving  Engagement in Group:  Limited  Engagement in Therapy:  Limited  Modes of Intervention:  Activity, Clarification, Education, Problem-solving and Support  Summary of Progress/Problems: Today's group addressed the issue of overcoming obstacles.  Patients were asked to identify their biggest obstacle post d/c that stands in the way of their on-going success, and then problem solve as to how to manage this.  Stated her biggest obstacle is remembering to take her meds.  She has addressed this by asking her 63 year old daughter to remind her.  "I know she will do it.  I guess I should have asked her to do this before."  Left soon after making this statement, and did not return.  Daryel Gerald B 03/04/2013   3:50 PM

## 2013-03-04 NOTE — Care Management Utilization Note (Signed)
   Per State Regulation 482.30  This chart was reviewed for necessity with respect to the patient's Admission/ Duration of stay.  Next review date:  03/07/13  Arville Postlewaite Morrison RN, BSN 

## 2013-03-04 NOTE — Progress Notes (Signed)
Seen and agreed. Tenelle Andreason, MD 

## 2013-03-04 NOTE — Progress Notes (Signed)
Adult Psychoeducational Group Note  Date:  03/04/2013 Time:  11:37 PM  Group Topic/Focus:  Wrap-Up Group:   The focus of this group is to help patients review their daily goal of treatment and discuss progress on daily workbooks.  Participation Level:  Active  Participation Quality:  Appropriate  Affect:  Appropriate  Cognitive:  Appropriate  Insight: Good  Engagement in Group:  Engaged  Modes of Intervention:  Support  Additional Comments:  Patient attended and participated in group tonight. She reports having a good day until she received a phone call. She advised that she have had a headache since. For her wellness she eat, shower and attend church  Lita Mains [facility] 03/04/2013, 11:37 PM

## 2013-03-04 NOTE — Progress Notes (Signed)
D: Patient in bed awake on approach.  Patient states she did not have a good day due to having a headache.  Patient states it is important that she stay on her medications.  Patient denies SI/HI and denies AVH tonight. A: Staff to monitor Q 15 mins for safety.  Encouragement and support offered.  No scheduled medications administered per orders. Tylenol and trazodone administered prn. R: Patient remains safe on the unit.  Patient attended group tonight.  Patient visible on the uni and interacting with peers.  Patient taking administered medications.

## 2013-03-04 NOTE — Progress Notes (Signed)
Patient ID: Valerie Lee, female   DOB: 03/02/1976, 37 y.o.   MRN: [id number] D: Patient's mood has improved.  She remains isolative to room, however, she is interacting more with staff and her peers.  She remains paranoid and watchful. She denies any SI/HI.  She reports that she is currently not hearing any voices.  Patient met with MD and asked how long she will be here.  Patient stated she would like to stay until the end of the week.  She is responding well to her medications. She is encouraged to attend groups and participating in her treatment.  A: continue to monitor medication management and MD orders.  Safety checks completed every 15 minutes per protocol.  R: patient is receptive to staff and her behavior is appropriate.

## 2013-03-04 NOTE — [facility] Group Notes (Signed)
[facility] LCSW Aftercare Discharge Planning Group Note   03/04/2013 8:09 AM  Participation Quality:  Engaged  Mood/Affect:  Appropriate  Depression Rating:  4  Anxiety Rating: 6  Thoughts of Suicide:  No Will you contract for safety?   NA  Current AVH:  No  Plan for Discharge/Comments:  C/O back lower back pain, but no mental health concerns.  Denies any of the symptoms that brought her in, other than anxiety.  Looks much better than she did on Friday.  Stayed for entire group.  Wondered when she would leave, but no agitating to go.  Transportation Means: family  Supports: family  [place], Valerie Lee

## 2013-03-04 NOTE — Progress Notes (Signed)
Recreation Therapy Notes   Date: 12.08.2014 Time: 9:30am Location: [address] Dayroom  Group Topic: Wellness  Goal Area(s) Addresses:  Patient will define components of whole wellness. Patient will verbalize benefit to self of whole wellness.  Behavioral Response: Attentive, Appropriate, Engaged   Intervention: Mind Map  Activity: Patients created a group flow chart relating to wellness and what makes up wellness. Patients were then asked to identify what they do to invest in each part of wellness.    Education: Wellness, Discharge Planning  Education Outcome: Acknowledges Education   Clinical Observations/Feedback: Patient with peers identified the following dimensions of wellness: Mental, Emotions, Physical and Spiritual. Patient actively engaged in group session, identifying dimensions of wellness, as well as giving examples of ways to invest in dimensions of wellness. Patient additionally identified that all dimensions of wellness are connected.   Marykay Lex Varnika Butz, LRT/CTRS  Irwin Toran L 03/04/2013 12:30 PM

## 2013-03-05 NOTE — Progress Notes (Signed)
D: Patiet in the dayroom eating a snack on approach.  Patient states she had a good day but states she had a migraine for most of the day.  Patient states the tylenol and sleep help some.  Patient state she has learned that she has to take of herself.  Patient appears brighter tonight.  Patient denies SI/HI and denies AVH. A: Staff to monitor Q 15 mins for safety.  Encouragement and support offered.  No scheduled medications administered per orders.  Tylenol and trazodone administered prn tonight. R: Patient remains safe on the unit.  Patient attended group tonight.  Patient visible on the unit.  Patient taking administered medications.

## 2013-03-05 NOTE — [facility] Group Notes (Signed)
[facility] LCSW Group Therapy  03/05/2013 , 12:15 PM   Type of Therapy:  Group Therapy  Participation Level:  Active  Participation Quality:  Attentive  Affect:  Appropriate  Cognitive:  Alert  Insight:  Improving  Engagement in Therapy:  Engaged  Modes of Intervention:  Discussion, Exploration and Socialization  Summary of Progress/Problems: Today's group focused on the term Diagnosis.  Participants were asked to define the term, and then pronounce whether it is a negative, positive or neutral term.  Valerie Lee was engaged and active for the first 10 minutes of group.  She abruptly got up and stumbled out, saying it was too bright in the room and she just couldn't take it, and did not come back.  Valerie Lee 03/05/2013 , 12:15 PM

## 2013-03-05 NOTE — Progress Notes (Signed)
Patient ID: Valerie Lee, female   DOB: 04-Jun-1975, 37 y.o.   MRN: [id number] [facility] MD Progress Note  03/05/2013 9:55 AM Valerie Lee  MRN:  [id number] Subjective: " I have headache and still hearing voices occasionally." Objective:  Patient reports that she is making slow but steady progress, she endorsed decreased anxiety, delusions, psychosis and depressive symptoms. She denies suicidal/homicidal ideations, intent or plan. Patient is compliant with her medications and has not verbalized any adverse reactions. Diagnosis:   DSM5: Schizophrenia Disorders:  Delusional Disorder (297.1) and Brief Psychotic Disorder (298.8) Obsessive-Compulsive Disorders:   Trauma-Stressor Disorders:   Substance/Addictive Disorders:  Cannabis Use Disorder - Moderate 9304.30) Depressive Disorders:  Major Depressive Disorder - with Psychotic Features (296.24)  Axis I: Paranoid Schizophrenia           Cannabis use disorder   ADL's:  Intact  Sleep: Fair  Appetite:  Fair  Suicidal Ideation: yes Plan:  denies  Intent:  denies Means:  denies Homicidal Ideation: denies   AEB (as evidenced by):  Psychiatric Specialty Exam: Review of Systems  Constitutional: Positive for malaise/fatigue.  HENT: Negative.   Eyes: Negative.   Respiratory: Negative.   Cardiovascular: Negative.   Gastrointestinal: Negative.   Genitourinary: Negative.   Musculoskeletal: Positive for joint pain and myalgias.  Skin: Negative.   Neurological: Negative.   Endo/Heme/Allergies: Negative.   Psychiatric/Behavioral: Positive for depression, hallucinations and substance abuse. The patient is nervous/anxious.     Blood pressure 104/69, pulse 85, temperature 98.2 F (36.8 C), temperature source Oral, resp. rate 20, height 5\' 1"  (1.549 m), weight 49.896 kg (110 lb), last menstrual period 04/12/2011.Body mass index is 20.8 kg/(m^2).  General Appearance: Fairly Groomed  Patent attorney::  Minimal  Speech:  normal  Volume:   normal  Mood:  Anxious   Affect:  neutral  Thought Process:  Linear  Orientation:  Full (Time, Place, and Person)  Thought Content:  Delusions   Suicidal Thoughts:  denies  Homicidal Thoughts:  No  Memory:  Immediate;   Fair Recent;   Fair Remote;   Fair  Judgement:  Poor  Insight:  Lacking  Psychomotor Activity:  Decreased  Concentration:  Fair  Recall:  Fair  Akathisia:  No  Handed:  Right  AIMS (if indicated):     Assets:  Communication Skills Desire for Improvement Social Support  Sleep:  Number of Hours: 6.75   Current Medications: Current Facility-Administered Medications  Medication Dose Route Frequency Provider Last Rate Last Dose  . acetaminophen (TYLENOL) tablet 1,000 mg  1,000 mg Oral Q6H PRN Sanjuana Kava, NP   1,000 mg at 03/05/13 0742  . albuterol (PROVENTIL HFA;VENTOLIN HFA) 108 (90 BASE) MCG/ACT inhaler 2 puff  2 puff Inhalation Q6H PRN Fransisca Kaufmann, NP      . alum & mag hydroxide-simeth (MAALOX/MYLANTA) 200-200-20 MG/5ML suspension 30 mL  30 mL Oral Q4H PRN Benjaman Pott, MD      . citalopram (CELEXA) tablet 20 mg  20 mg Oral Daily Doral Ventrella   20 mg at 03/05/13 0742  . docusate sodium (COLACE) capsule 100 mg  100 mg Oral BID Johnnisha Forton   100 mg at 03/05/13 0742  . haloperidol (HALDOL) tablet 5 mg  5 mg Oral BID Harley Mccartney   5 mg at 03/05/13 0742  . hydrOXYzine (ATARAX/VISTARIL) tablet 25 mg  25 mg Oral BID PC Demmi Sindt   25 mg at 03/05/13 0742  . influenza vac split quadrivalent PF (FLUARIX) injection 0.5  mL  0.5 mL Intramuscular Tomorrow-1000 Tonea Leiphart      . magnesium hydroxide (MILK OF MAGNESIA) suspension 30 mL  30 mL Oral Daily PRN Benjaman Pott, MD      . OLANZapine zydis (ZYPREXA) disintegrating tablet 5 mg  5 mg Oral Q8H PRN Earlyne Feeser      . traZODone (DESYREL) tablet 50 mg  50 mg Oral QHS PRN Fransisca Kaufmann, NP   50 mg at 03/04/13 2118    Lab Results:  No results found for this or any previous visit (from the past 48  hour(s)).  Physical Findings: AIMS: Facial and Oral Movements Muscles of Facial Expression: None, normal Lips and Perioral Area: None, normal Jaw: None, normal Tongue: None, normal,Extremity Movements Upper (arms, wrists, hands, fingers): None, normal Lower (legs, knees, ankles, toes): None, normal, Trunk Movements Neck, shoulders, hips: None, normal, Overall Severity Severity of abnormal movements (highest score from questions above): None, normal Incapacitation due to abnormal movements: None, normal Patient's awareness of abnormal movements (rate only patient's report): No Awareness, Dental Status Current problems with teeth and/or dentures?: No Does patient usually wear dentures?: No  CIWA:    COWS:     Treatment Plan Summary: Daily contact with patient to assess and evaluate symptoms and progress in treatment Medication management  Plan:1. Admit for crisis management and stabilization. 2. Medication management to reduce current symptoms to base line and improve the patient's overall level of functioning 3. Treat health problems as indicated. 4. Develop treatment plan to decrease risk of relapse upon discharge and the need for readmission. 5. Psycho-social education regarding relapse prevention and self care. 6. Health care follow up as needed for medical problems. 7. Restart home medications where appropriate. 8. Continue Haldol to 5mg  po bid for psychosis/delusion 9. Continue Celexa to 20mg  po daily for depression.  Medical Decision Making Problem Points:  Established problem, stable/improving (1), Review of last therapy session (1) and Review of psycho-social stressors (1) Data Points:  Order Aims Assessment (2) Review of medication regiment & side effects (2) Review of new medications or change in dosage (2)  I certify that inpatient services furnished can reasonably be expected to improve the patient's condition.   Thedore Mins, MD 03/05/2013, 9:55 AM

## 2013-03-05 NOTE — Progress Notes (Signed)
Patient ID: Valerie Lee, female   DOB: [birth year], 37 y.o.   MRN: [id number] D: Patient complained of headache this morning and given tylenol for same.  She has been in bed this morning because of her "pounding headache."  She states the auditory hallucinations have decreased; she is less depressed today.  She denies any SI/HI.  Patient is interacting well with staff and her peers on the unit.  She is less isolative to her room.  A: encourage patient to attend groups and participate in her treatment.  Continue to monitor medication management and MD orders.  Safety checks completed every 15 minutes per protocol.  R: patient is receptive to staff and her behavior is appropriate.

## 2013-03-05 NOTE — Progress Notes (Signed)
Adult Psychoeducational Group Note  Date:  03/05/2013 Time:  9:33 PM  Group Topic/Focus:  Wrap-Up Group:   The focus of this group is to help patients review their daily goal of treatment and discuss progress on daily workbooks.  Participation Level:  Minimal  Participation Quality:  Attentive  Affect:  Appropriate  Cognitive:  Alert  Insight: Good  Engagement in Group:  Engaged  Modes of Intervention:  Discussion  Additional Comments:  Patient states that she had a good day but had a migraine. Pt. Says she will stay on her medication and will have follow up care with [facility]. Patient says the most positive thing that happened for her today was that the Lord woke her up this morning.  [facility] 03/05/2013, 9:33 PM

## 2013-03-06 NOTE — [facility] Group Notes (Signed)
[facility] LCSW Aftercare Discharge Planning Group Note   03/06/2013 9:59 AM  Participation Quality:  Engaged  Mood/Affect:  Flat  Depression Rating:  2  Anxiety Rating:  2  Thoughts of Suicide:  No Will you contract for safety?   NA  Current AVH:  No  Plan for Discharge/Comments:  Is hoping to leave tomorrow instead of Friday, but is resigned to the fact that she may not go until Friday.  Plans to follow up at [facility].  Said her pastor will pick her up.  Also said she gets services from a place called [facility], and would like a letter for them.  Transportation Means: pastor  Supports: Education officer, environmental, friends  [place], [place] B

## 2013-03-06 NOTE — Progress Notes (Signed)
[facility] Adult Case Management Discharge Plan :  Will you be returning to the same living situation after discharge: Yes,  home At discharge, do you have transportation home?:Yes,  pastor Do you have the ability to pay for your medications:Yes,  MCD  Release of information consent forms completed and in the chart;  Patient's signature needed at discharge.  Patient to Follow up at: Follow-up Information   Follow up with [facility] . (Go to the walk-in clinic between 8 and 9AM M-F for your hospital follow-up appointment)    Contact information:   [address] [phone]      Patient denies SI/HI:   Yes,  yes    Safety Planning and Suicide Prevention discussed:  Yes,  yes  Valerie Lee 03/06/2013, 1:50 PM

## 2013-03-06 NOTE — Progress Notes (Signed)
Adult Psychoeducational Group Note  Date:  03/06/2013 Time:  9:10 PM  Group Topic/Focus:  Wrap-Up Group:   The focus of this group is to help patients review their daily goal of treatment and discuss progress on daily workbooks.  Participation Level:  Active  Participation Quality:  Appropriate and sharing  Affect:  Appropriate  Cognitive:  Appropriate  Insight: Improving  Engagement in Group:  Engaged  Modes of Intervention:  Support  Additional Comments:  Patient attended and participated in group tonight. She reports having a good day. She watched television and colored. The highlight of her day was her roommate made her feel special. Today she slept on and off. For her personal development she will continue taking her medication.  Valerie Lee [facility] 03/06/2013, 9:10 PM

## 2013-03-06 NOTE — Progress Notes (Signed)
D:  Patient in her room reading on approach.  Patient states she had a better day today.  Patient states she no longer is having a migraine headache.  Patient states she did not learn anything today but states she is ready to be discharged.  Patient denies SI/HI and denies AVH.    A: Staff to monitor Q 15 mins for safety.  Encouragement and support offered.  No scheduled medications administered per orders.  Tylenol and trazodone administered prn tonight. R: Patient remains safe on the unit.  Patient attended group tonight.  Patient visible on the unit.  Patient taking administered medications.

## 2013-03-06 NOTE — [facility] Group Notes (Signed)
[facility] [employer] Group Therapy  03/06/2013 , 1:18 PM    Type of Therapy:  [employer] Presentation  Participation Level:  Active  Participation Quality:  Attentive  Affect:  Blunted  Cognitive:  Oriented  Insight:  Limited  Engagement in Therapy:  Engaged  Modes of Intervention:  Discussion, Education and Socialization  Summary of Progress/Problems:  Onalee Hua from [employer] came to present his recovery story and play the guitar.  Sat quietly, often sucking her thumb and stroking her ear.  Eventually fell asleep.  Woke up again for the guitar playing portion.  Stayed for the entire group.  Daryel Gerald B 03/06/2013 , 1:18 PM

## 2013-03-06 NOTE — Progress Notes (Signed)
Recreation Therapy Notes  Date: 12.10.2014 Time: 9:30am Location: [address] Dayroom  Group Topic: Leisure Education  Goal Area(s) Addresses:  Patient will identify positive leisure activities.  Patient will identify one positive benefit of participation in leisure activities.   Behavioral Response: Engaged  Intervention: Game  Activity: Leisure ABC's.   Education:  Leisure Education, Pharmacologist, Building control surveyor.   Education Outcome: Acknowledges understanding  Clinical Observations/Feedback: Patient actively engaged in group activity identifying positive leisure activities to correspond with the letters of the alphabet. Patient contributed to group discussion identifying a positive benefit of leisure participation.   Marykay Lex Teosha Casso, LRT/CTRS  Kailey Esquilin L 03/06/2013 1:50 PM

## 2013-03-06 NOTE — Progress Notes (Signed)
Patient ID: Valerie Lee, female   DOB: 25-Apr-1975, 37 y.o.   MRN: [id number] [facility] MD Progress Note  03/06/2013 1:37 PM [name]  MRN:  [id number] Subjective: Patient states "I feel stable since I'm back on my medications. I am going to stay on them. My daughter is going to help me set my phone alarm so I will remember to take them. I'm sorry that I was rude to you when I first came in here."   Objective:  Patient has become more visible on the unit. She is no longer covering herself up expressing extreme fear of others around her. Patient reports feeling as though her medications have helped her and she seems investing in continuing her mental health care after discharge. Patient wanting to leave the hospital today reporting daughter was sick. She has been told her discharge will take place tomorrow since she is doing well.   Diagnosis:   DSM5: Schizophrenia Disorders:  Delusional Disorder (297.1) and Brief Psychotic Disorder (298.8) Obsessive-Compulsive Disorders:   Trauma-Stressor Disorders:   Substance/Addictive Disorders:  Cannabis Use Disorder - Moderate 9304.30) Depressive Disorders:  Major Depressive Disorder - with Psychotic Features (296.24)  Axis I: Paranoid Schizophrenia           Cannabis use disorder  ADL's:  Intact  Sleep: Fair  Appetite:  Fair  Suicidal Ideation: yes Plan:  denies  Intent:  denies Means:  denies Homicidal Ideation: denies   AEB (as evidenced by):  Psychiatric Specialty Exam: Review of Systems  Constitutional: Negative.   HENT: Negative.   Eyes: Negative.   Respiratory: Negative.   Cardiovascular: Negative.   Gastrointestinal: Negative.   Genitourinary: Negative.   Musculoskeletal: Negative.   Skin: Negative.   Neurological: Negative.   Endo/Heme/Allergies: Negative.   Psychiatric/Behavioral: Positive for depression, hallucinations and substance abuse. Negative for suicidal ideas and memory loss. The patient is  nervous/anxious. The patient does not have insomnia.     Blood pressure 104/72, pulse 76, temperature 98.2 F (36.8 C), temperature source Oral, resp. rate 20, height 5\' 1"  (1.549 m), weight 49.896 kg (110 lb), last menstrual period 04/12/2011.Body mass index is 20.8 kg/(m^2).  General Appearance: Fairly Groomed  Patent attorney::  Fair  Speech:  normal  Volume:  normal  Mood:  Anxious   Affect:  neutral  Thought Process:  Linear  Orientation:  Full (Time, Place, and Person)  Thought Content:  Delusions   Suicidal Thoughts:  denies  Homicidal Thoughts:  No  Memory:  Immediate;   Fair Recent;   Fair Remote;   Fair  Judgement:  Poor  Insight:  Lacking  Psychomotor Activity:  Decreased  Concentration:  Fair  Recall:  Fair  Akathisia:  No  Handed:  Right  AIMS (if indicated):     Assets:  Communication Skills Desire for Improvement Social Support  Sleep:  Number of Hours: 6.75   Current Medications: Current Facility-Administered Medications  Medication Dose Route Frequency Provider Last Rate Last Dose  . acetaminophen (TYLENOL) tablet 1,000 mg  1,000 mg Oral Q6H PRN Sanjuana Kava, NP   1,000 mg at 03/05/13 2113  . albuterol (PROVENTIL HFA;VENTOLIN HFA) 108 (90 BASE) MCG/ACT inhaler 2 puff  2 puff Inhalation Q6H PRN Fransisca Kaufmann, NP      . alum & mag hydroxide-simeth (MAALOX/MYLANTA) 200-200-20 MG/5ML suspension 30 mL  30 mL Oral Q4H PRN Benjaman Pott, MD      . citalopram (CELEXA) tablet 20 mg  20 mg Oral Daily Mojeed  Akintayo   20 mg at 03/06/13 0802  . docusate sodium (COLACE) capsule 100 mg  100 mg Oral BID Mojeed Akintayo   100 mg at 03/06/13 0802  . haloperidol (HALDOL) tablet 5 mg  5 mg Oral BID Mojeed Akintayo   5 mg at 03/06/13 0802  . hydrOXYzine (ATARAX/VISTARIL) tablet 25 mg  25 mg Oral BID PC Mojeed Akintayo   25 mg at 03/06/13 0802  . influenza vac split quadrivalent PF (FLUARIX) injection 0.5 mL  0.5 mL Intramuscular Tomorrow-1000 Mojeed Akintayo      . magnesium  hydroxide (MILK OF MAGNESIA) suspension 30 mL  30 mL Oral Daily PRN Benjaman Pott, MD      . OLANZapine zydis (ZYPREXA) disintegrating tablet 5 mg  5 mg Oral Q8H PRN Mojeed Akintayo      . traZODone (DESYREL) tablet 50 mg  50 mg Oral QHS PRN Fransisca Kaufmann, NP   50 mg at 03/05/13 2112    Lab Results:  No results found for this or any previous visit (from the past 48 hour(s)).  Physical Findings: AIMS: Facial and Oral Movements Muscles of Facial Expression: None, normal Lips and Perioral Area: None, normal Jaw: None, normal Tongue: None, normal,Extremity Movements Upper (arms, wrists, hands, fingers): None, normal Lower (legs, knees, ankles, toes): None, normal, Trunk Movements Neck, shoulders, hips: None, normal, Overall Severity Severity of abnormal movements (highest score from questions above): None, normal Incapacitation due to abnormal movements: None, normal Patient's awareness of abnormal movements (rate only patient's report): No Awareness, Dental Status Current problems with teeth and/or dentures?: No Does patient usually wear dentures?: No  CIWA:    COWS:     Treatment Plan Summary: Daily contact with patient to assess and evaluate symptoms and progress in treatment Medication management  Plan:1. Continue crisis management and stabilization. 2. Medication management to reduce current symptoms to base line and improve the patient's overall level of functioning 3. Treat health problems as indicated. 4. Develop treatment plan to decrease risk of relapse upon discharge and the need for readmission. 5. Psycho-social education regarding relapse prevention and self care. 6. Health care follow up as needed for medical problems. 7. Restart home medications where appropriate. 8. Continue Haldol to 5mg  po bid for psychosis/delusion 9. Continue Celexa to 20mg  po daily for depression. 10. Anticipate that the patient will d/c tomorrow.   Medical Decision Making Problem Points:   Established problem, stable/improving (1), Review of last therapy session (1) and Review of psycho-social stressors (1) Data Points:  Order Aims Assessment (2) Review of medication regiment & side effects (2) Review of new medications or change in dosage (2)  I certify that inpatient services furnished can reasonably be expected to improve the patient's condition.   Fransisca Kaufmann, NP-C 03/06/2013, 1:37 PM

## 2013-03-06 NOTE — Tx Team (Signed)
  Interdisciplinary Treatment Plan Update   Date Reviewed:  03/06/2013  Time Reviewed:  1:44 PM  Progress in Treatment:   Attending groups: Yes Participating in groups: Yes Taking medication as prescribed: Yes  Tolerating medication: Yes Family/Significant other contact made: Yes  Patient understands diagnosis: Yes  Discussing patient identified problems/goals with staff: Yes Medical problems stabilized or resolved: Yes Denies suicidal/homicidal ideation: Yes Patient has not harmed self or others: Yes  For review of initial/current patient goals, please see plan of care.  Estimated Length of Stay:  Likely d/c tomorrow  Reason for Continuation of Hospitalization:   New Problems/Goals identified:  N/A  Discharge Plan or Barriers:   return home, follow up outpt  Additional Comments:  Attendees:  Signature: Thedore Mins, MD 03/06/2013 1:44 PM   Signature: Richelle Ito, LCSW 03/06/2013 1:44 PM  Signature: Fransisca Kaufmann, NP 03/06/2013 1:44 PM  Signature: Joslyn Devon, RN 03/06/2013 1:44 PM  Signature: Liborio Nixon, RN 03/06/2013 1:44 PM  Signature:  03/06/2013 1:44 PM  Signature:   03/06/2013 1:44 PM  Signature:    Signature:    Signature:    Signature:    Signature:    Signature:      Scribe for Treatment Team:   Richelle Ito, LCSW  03/06/2013 1:44 PM

## 2013-03-06 NOTE — Progress Notes (Signed)
Patient ID: Valerie Lee, female   DOB: 04-20-1975, 37 y.o.   MRN: [id number] D: Patient presents with flat, blunted affect.  She came to medication window asked to speak with the doctor.  She states that her daughter is sick and "I'm the only one she responds to."  Patient stated the daughter is having body aches all over and "she is going to the hospital."  Patient had requested to be discharged on Friday.  She now would like to speak with the doctor about getting released earlier.  She denies any SI/HI/AVH.  A: continue to monitor medication management and MD orders.  Safety checks completed every 15 minutes per protocol.  R: patient is receptive to staff and her behavior is appropriate.

## 2013-03-07 DIAGNOSIS — F329 Major depressive disorder, single episode, unspecified: Secondary | ICD-10-CM | POA: Diagnosis present

## 2013-03-07 DIAGNOSIS — F3289 Other specified depressive episodes: Secondary | ICD-10-CM

## 2013-03-07 MED ORDER — TRAZODONE HCL 50 MG PO TABS: 50.0000 mg | ORAL_TABLET | Freq: Every evening | ORAL | Status: DC | PRN

## 2013-03-07 MED ORDER — HYDROXYZINE HCL 25 MG PO TABS: 25.0000 mg | ORAL_TABLET | Freq: Two times a day (BID) | ORAL | Status: DC

## 2013-03-07 MED ORDER — HALOPERIDOL 5 MG PO TABS: 5.0000 mg | ORAL_TABLET | Freq: Two times a day (BID) | ORAL | Status: DC

## 2013-03-07 MED ORDER — CITALOPRAM HYDROBROMIDE 20 MG PO TABS: 20.0000 mg | ORAL_TABLET | Freq: Every day | ORAL | Status: DC

## 2013-03-07 NOTE — Progress Notes (Signed)
Patient ID: Valerie Lee, female   DOB: December 11, 1975, 37 y.o.   MRN: [id number]  D: Patient reports feels ready for discharge. Denies any SI, paranoia, or a/v hallucinations. Treatment team felt patient ready for discharge. A: Obtained all belongings in room and locker. Obtained prescriptions along with sample medications. Follow up appointment at [facility]. R: Family in lobby to transport

## 2013-03-07 NOTE — [facility] Suicide Risk Assessment (Signed)
Suicide Risk Assessment  Discharge Assessment     Demographic Factors:  Low socioeconomic status and Unemployed  Mental Status Per Nursing Assessment::   On Admission:     Current Mental Status by Physician: patient denies suicidal ideation, intent or plan   Loss Factors: N/A  Historical Factors: Impulsivity  Risk Reduction Factors:   Sense of responsibility to family, Living with another person, especially a relative, Positive social support and Positive coping skills or problem solving skills  Continued Clinical Symptoms:  Resolving psychosis and delusions  Cognitive Features That Contribute To Risk:  Closed-mindedness Polarized thinking    Suicide Risk:  Minimal: No identifiable suicidal ideation.  Patients presenting with no risk factors but with morbid ruminations; may be classified as minimal risk based on the severity of the depressive symptoms  Discharge Diagnoses:   AXIS I:  Paranoid Schizophrenia               Unspecified depression  AXIS II:  Deferred AXIS III:   Past Medical History  Diagnosis Date  . G6PD (glucose 6 phosphatase deficiency)   . Anemia   . G6PD deficiency anemia   . Blood transfusion   . Asthma     rarely uses albuterol rescue  . Mental disorder   . Schizophrenia   . Schizophrenia   . Bipolar 1 disorder    AXIS IV:  other psychosocial or environmental problems and problems with access to health care services AXIS V:  61-70 mild symptoms  Plan Of Care/Follow-up recommendations:  Activity:  as tolerated Diet:  healthy Tests:  routine Other:  patient to keep her after care appointment  Is patient on multiple antipsychotic therapies at discharge:  No   Has Patient had three or more failed trials of antipsychotic monotherapy by history:  No  Recommended Plan for Multiple Antipsychotic Therapies: NA  Thedore Mins, MD 03/07/2013, 9:41 AM

## 2013-03-07 NOTE — Progress Notes (Signed)
Patient ID: Valerie Lee, female   DOB: 11-30-1975, 37 y.o.   MRN: [id number]  D: Patient pleasant on approach. Denies SI or a/v hallucinations. Reports possible discharge today. A: Staff will continue to monitor on q 15 minute checks, follow treatment plan, and give meds. R: Cooperative on unit.

## 2013-03-07 NOTE — Progress Notes (Signed)
Seen and agreed. Rhylei Mcquaig, MD 
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
## 2013-03-07 NOTE — Progress Notes (Signed)
Adult Psychoeducational Group Note  Date:  03/07/2013 Time:  10:20 AM  Group Topic/Focus:  Leisure and Lifestyle changes.    Participation Level:  Did Not Attend  Participation Quality:    Affect:    Cognitive:    Insight:   Engagement in Group:    Modes of Intervention:    Additional Comments:  Pt did not attend leisure and lifestyle changes group this morning.    Jerrod Damiano A 03/07/2013, 10:20 AM

## 2013-03-08 NOTE — Discharge Summary (Signed)
Seen and agreed. Tnya Ades, MD 

## 2013-03-12 NOTE — Progress Notes (Signed)
Patient Discharge Instructions:  After Visit Summary (AVS):   Faxed to:  03/12/13 Discharge Summary Note:   Faxed to:  03/12/13 Psychiatric Admission Assessment Note:   Faxed to:  03/12/13 Suicide Risk Assessment - Discharge Assessment:   Faxed to:  03/12/13 Faxed/Sent to the Next Level Care provider:  03/12/13 Faxed to [facility] @ [phone]  Jerelene Redden, 03/12/2013, 4:10 PM

## 2013-07-30 DIAGNOSIS — F259 Schizoaffective disorder, unspecified: Secondary | ICD-10-CM | POA: Diagnosis not present

## 2013-08-01 DIAGNOSIS — F259 Schizoaffective disorder, unspecified: Secondary | ICD-10-CM | POA: Diagnosis not present

## 2013-08-02 DIAGNOSIS — F259 Schizoaffective disorder, unspecified: Secondary | ICD-10-CM | POA: Diagnosis not present

## 2013-08-03 DIAGNOSIS — F259 Schizoaffective disorder, unspecified: Secondary | ICD-10-CM | POA: Diagnosis not present

## 2013-08-04 DIAGNOSIS — F259 Schizoaffective disorder, unspecified: Secondary | ICD-10-CM | POA: Diagnosis not present

## 2013-10-09 DIAGNOSIS — F259 Schizoaffective disorder, unspecified: Secondary | ICD-10-CM | POA: Diagnosis not present

## 2013-10-15 DIAGNOSIS — F259 Schizoaffective disorder, unspecified: Secondary | ICD-10-CM | POA: Diagnosis not present

## 2013-11-29 ENCOUNTER — Emergency Department (HOSPITAL_COMMUNITY)
Admission: EM | Admit: 2013-11-29 | Discharge: 2013-11-29 | Disposition: A | Discharge status: Home / Self Care | Payer: Medicare Other | Attending: Emergency Medicine | Admitting: Emergency Medicine

## 2013-11-29 ENCOUNTER — Emergency Department (HOSPITAL_COMMUNITY): Payer: Medicare Other

## 2013-11-29 ENCOUNTER — Encounter (HOSPITAL_COMMUNITY): Payer: Self-pay | Admitting: Emergency Medicine

## 2013-11-29 DIAGNOSIS — F319 Bipolar disorder, unspecified: Secondary | ICD-10-CM | POA: Diagnosis not present

## 2013-11-29 DIAGNOSIS — N898 Other specified noninflammatory disorders of vagina: Secondary | ICD-10-CM | POA: Insufficient documentation

## 2013-11-29 DIAGNOSIS — J45909 Unspecified asthma, uncomplicated: Secondary | ICD-10-CM | POA: Insufficient documentation

## 2013-11-29 DIAGNOSIS — N83209 Unspecified ovarian cyst, unspecified side: Secondary | ICD-10-CM | POA: Insufficient documentation

## 2013-11-29 DIAGNOSIS — Z3202 Encounter for pregnancy test, result negative: Secondary | ICD-10-CM | POA: Diagnosis not present

## 2013-11-29 DIAGNOSIS — R509 Fever, unspecified: Secondary | ICD-10-CM | POA: Insufficient documentation

## 2013-11-29 DIAGNOSIS — Z862 Personal history of diseases of the blood and blood-forming organs and certain disorders involving the immune mechanism: Secondary | ICD-10-CM | POA: Insufficient documentation

## 2013-11-29 DIAGNOSIS — Z79899 Other long term (current) drug therapy: Secondary | ICD-10-CM | POA: Diagnosis not present

## 2013-11-29 DIAGNOSIS — F209 Schizophrenia, unspecified: Secondary | ICD-10-CM | POA: Diagnosis not present

## 2013-11-29 DIAGNOSIS — Z90710 Acquired absence of both cervix and uterus: Secondary | ICD-10-CM | POA: Insufficient documentation

## 2013-11-29 DIAGNOSIS — R52 Pain, unspecified: Secondary | ICD-10-CM | POA: Insufficient documentation

## 2013-11-29 DIAGNOSIS — R1084 Generalized abdominal pain: Secondary | ICD-10-CM | POA: Insufficient documentation

## 2013-11-29 DIAGNOSIS — A749 Chlamydial infection, unspecified: Secondary | ICD-10-CM | POA: Diagnosis not present

## 2013-11-29 DIAGNOSIS — F17200 Nicotine dependence, unspecified, uncomplicated: Secondary | ICD-10-CM | POA: Diagnosis not present

## 2013-11-29 DIAGNOSIS — Z880 Allergy status to penicillin: Secondary | ICD-10-CM | POA: Insufficient documentation

## 2013-11-29 DIAGNOSIS — Z9889 Other specified postprocedural states: Secondary | ICD-10-CM | POA: Diagnosis not present

## 2013-11-29 DIAGNOSIS — N83202 Unspecified ovarian cyst, left side: Secondary | ICD-10-CM

## 2013-11-29 DIAGNOSIS — A7489 Other chlamydial diseases: Secondary | ICD-10-CM | POA: Diagnosis not present

## 2013-11-29 LAB — COMPREHENSIVE METABOLIC PANEL
ALBUMIN: 4.1 g/dL (ref 3.5–5.2)
ALK PHOS: 52 U/L (ref 39–117)
ALT: 11 U/L (ref 0–35)
AST: 20 U/L (ref 0–37)
Anion gap: 12 (ref 5–15)
BUN: 13 mg/dL (ref 6–23)
CALCIUM: 9.8 mg/dL (ref 8.4–10.5)
CO2: 25 mEq/L (ref 19–32)
Chloride: 102 mEq/L (ref 96–112)
Creatinine, Ser: 0.74 mg/dL (ref 0.50–1.10)
GFR calc Af Amer: >90 mL/min (ref >90)
GFR calc non Af Amer: >90 mL/min (ref >90)
Glucose, Bld: 81 mg/dL (ref 70–99)
Potassium: 4.5 mEq/L (ref 3.7–5.3)
SODIUM: 139 meq/L (ref 137–147)
TOTAL PROTEIN: 8.2 g/dL (ref 6.0–8.3)
Total Bilirubin: 0.3 mg/dL (ref 0.3–1.2)

## 2013-11-29 LAB — CBC WITH DIFFERENTIAL/PLATELET
BASOS ABS: 0 10*3/uL (ref 0.0–0.1)
Basophils Relative: 1 % (ref 0–1)
EOS ABS: 0.2 10*3/uL (ref 0.0–0.7)
EOS PCT: 2 % (ref 0–5)
HCT: 41.4 % (ref 36.0–46.0)
Hemoglobin: 14 g/dL (ref 12.0–15.0)
Lymphocytes Relative: 22 % (ref 12–46)
Lymphs Abs: 1.9 10*3/uL (ref 0.7–4.0)
MCH: 33.2 pg (ref 26.0–34.0)
MCHC: 33.8 g/dL (ref 30.0–36.0)
MCV: 98.1 fL (ref 78.0–100.0)
Monocytes Absolute: 0.6 10*3/uL (ref 0.1–1.0)
Monocytes Relative: 7 % (ref 3–12)
NEUTROS PCT: 68 % (ref 43–77)
Neutro Abs: 6.1 10*3/uL (ref 1.7–7.7)
PLATELETS: 252 10*3/uL (ref 150–400)
RBC: 4.22 MIL/uL (ref 3.87–5.11)
RDW: 12.5 % (ref 11.5–15.5)
WBC: 8.8 10*3/uL (ref 4.0–10.5)

## 2013-11-29 LAB — URINALYSIS, ROUTINE W REFLEX MICROSCOPIC
Bilirubin Urine: NEGATIVE
Glucose, UA: NEGATIVE mg/dL
Ketones, ur: NEGATIVE mg/dL
LEUKOCYTES UA: NEGATIVE
Nitrite: NEGATIVE
PH: 6 (ref 5.0–8.0)
PROTEIN: NEGATIVE mg/dL
SPECIFIC GRAVITY, URINE: 1.012 (ref 1.005–1.030)
Urobilinogen, UA: 0.2 mg/dL (ref 0.0–1.0)

## 2013-11-29 LAB — URINE MICROSCOPIC-ADD ON

## 2013-11-29 LAB — POC URINE PREG, ED: PREG TEST UR: NEGATIVE

## 2013-11-29 LAB — LIPASE, BLOOD: Lipase: 34 U/L (ref 11–59)

## 2013-11-29 LAB — WET PREP, GENITAL
TRICH WET PREP: NONE SEEN
WBC, Wet Prep HPF POC: NONE SEEN
YEAST WET PREP: NONE SEEN

## 2013-11-29 LAB — HIV ANTIBODY (ROUTINE TESTING W REFLEX): HIV 1&2 Ab, 4th Generation: NONREACTIVE

## 2013-11-29 LAB — RPR

## 2013-11-29 MED ORDER — ONDANSETRON HCL 4 MG/2ML IJ SOLN
4.0000 mg | Freq: Once | INTRAMUSCULAR | Status: AC
  Administered 2013-11-29: 4 mg via INTRAVENOUS
  Filled 2013-11-29: qty 2

## 2013-11-29 MED ORDER — CEFTRIAXONE SODIUM 250 MG IJ SOLR
250.0000 mg | Freq: Once | INTRAMUSCULAR | Status: AC
Start: 2013-11-29 — End: 2013-11-29
  Administered 2013-11-29: 250 mg via INTRAMUSCULAR
  Filled 2013-11-29: qty 250

## 2013-11-29 MED ORDER — IOHEXOL 300 MG/ML  SOLN
50.0000 mL | Freq: Once | INTRAMUSCULAR | Status: AC | PRN
  Administered 2013-11-29: 50 mL via ORAL

## 2013-11-29 MED ORDER — AZITHROMYCIN 1 G PO PACK
1.0000 g | PACK | Freq: Once | ORAL | Status: AC
Start: 2013-11-29 — End: 2013-11-29
  Administered 2013-11-29: 1 g via ORAL
  Filled 2013-11-29: qty 1

## 2013-11-29 MED ORDER — HYDROCODONE-ACETAMINOPHEN 5-325 MG PO TABS: 1.0000 | ORAL_TABLET | ORAL | Status: DC | PRN

## 2013-11-29 MED ORDER — HYDROMORPHONE HCL PF 1 MG/ML IJ SOLN
0.5000 mg | INTRAMUSCULAR | Status: DC | PRN
  Administered 2013-11-29 (×2): 0.5 mg via INTRAVENOUS
  Filled 2013-11-29 (×2): qty 1

## 2013-11-29 MED ORDER — IOHEXOL 300 MG/ML  SOLN
100.0000 mL | Freq: Once | INTRAMUSCULAR | Status: AC | PRN
  Administered 2013-11-29: 100 mL via INTRAVENOUS

## 2013-11-29 MED ORDER — LIDOCAINE HCL 1 % IJ SOLN
INTRAMUSCULAR | Status: AC
  Administered 2013-11-29: 20 mL
  Filled 2013-11-29: qty 20

## 2013-11-29 MED ORDER — SODIUM CHLORIDE 0.9 % IV SOLN
1000.0000 mL | Freq: Once | INTRAVENOUS | Status: AC
Start: 2013-11-29 — End: 2013-11-29
  Administered 2013-11-29: 1000 mL via INTRAVENOUS

## 2013-11-29 MED ORDER — SODIUM CHLORIDE 0.9 % IV SOLN
1000.0000 mL | INTRAVENOUS | Status: DC
  Administered 2013-11-29: 1000 mL via INTRAVENOUS

## 2013-11-29 NOTE — ED Provider Notes (Signed)
CSN: [id number]     Arrival date & time 11/29/13  0917 History   First MD Initiated Contact with Patient 11/29/13 0957     Chief Complaint  Patient presents with  . Generalized Body Aches  . Exposure to STD     HPI Pt has been having pain from the top of her head to the bottom of her feet for the last two days.  The pain is constant aching pain.  Nothing makes it better or worse.  She has tried naproxen but it has not helped.  She has had fevers, up to 101 measured at home.  Patient is also having abdominal pain next diffuse. Palpation and movement increased the pain.  Pt has been told by her partner yesterday that he was diagnosed with chlamydia.  She denies any vaginal symptoms. Past Medical History  Diagnosis Date  . G6PD (glucose 6 phosphatase deficiency)   . Anemia   . G6PD deficiency anemia   . Blood transfusion   . Asthma     rarely uses albuterol rescue  . Mental disorder   . Schizophrenia   . Schizophrenia   . Bipolar 1 disorder    Past Surgical History  Procedure Laterality Date  . Tubal ligation    . Cholecystectomy  2011  . Abdominal hysterectomy  05/26/2011    Procedure: HYSTERECTOMY ABDOMINAL;  Surgeon: Mora Bellman, MD;  Location: [facility];  Service: Gynecology;  Laterality: N/A;  . Tubal ligation     Family History  Problem Relation Age of Onset  . Hypertension Mother    History  Substance Use Topics  . Smoking status: Current Every Day Smoker -- 0.20 packs/day for 15 years    Types: Cigarettes  . Smokeless tobacco: Never Used  . Alcohol Use: No   OB History   Grav Para Term Preterm Abortions TAB SAB Ect Mult Living   6 6 5 1  0 0 0 0 0 6     Review of Systems  Constitutional: Positive for fever.  HENT: Negative for sore throat.   Respiratory: Negative for cough.   Gastrointestinal: Positive for vomiting and abdominal pain.  Genitourinary: Negative for dysuria and vaginal discharge.  Skin: Negative for rash.  All other systems reviewed and are  negative.     Allergies  Bactrim; Blue dyes (parenteral); Codeine; Penicillins; Aspirin; and Ibuprofen  Home Medications   Prior to Admission medications   Medication Sig Start Date End Date Taking? Authorizing Provider  ARIPiprazole (ABILIFY) 5 MG tablet Take 10 mg by mouth daily.   Yes Historical Provider, MD  IRON PO Take 1 tablet by mouth daily.   Yes Historical Provider, MD  modafinil (PROVIGIL) 200 MG tablet Take 200 mg by mouth daily.   Yes Historical Provider, MD  prazosin (MINIPRESS) 1 MG capsule Take 1 mg by mouth at bedtime.   Yes Historical Provider, MD  traZODone (DESYREL) 50 MG tablet Take 1 tablet (50 mg total) by mouth at bedtime as needed for sleep. 03/07/13  Yes Elmarie Shiley, NP  citalopram (CELEXA) 20 MG tablet Take 1 tablet (20 mg total) by mouth daily. 03/07/13   Elmarie Shiley, NP  haloperidol (HALDOL) 5 MG tablet Take 1 tablet (5 mg total) by mouth 2 (two) times daily. For mood control. 03/07/13   Elmarie Shiley, NP  HYDROcodone-acetaminophen (NORCO/VICODIN) 5-325 MG per tablet Take 1-2 tablets by mouth every 4 (four) hours as needed. 11/29/13   Dorie Rank, MD  hydrOXYzine (ATARAX/VISTARIL) 25 MG tablet Take 1  tablet (25 mg total) by mouth 2 (two) times daily after a meal. 03/07/13   Elmarie Shiley, NP   BP 107/62  Pulse 68  Temp(Src) 98.6 F (37 C) (Oral)  Resp 21  SpO2 99%  LMP 04/12/2011 Physical Exam  Nursing note and vitals reviewed. Constitutional: She appears well-developed and well-nourished. No distress.  HENT:  Head: Normocephalic and atraumatic.  Right Ear: External ear normal.  Left Ear: External ear normal.  Eyes: Conjunctivae are normal. Right eye exhibits no discharge. Left eye exhibits no discharge. No scleral icterus.  Neck: Neck supple. No tracheal deviation present.  Cardiovascular: Normal rate, regular rhythm and intact distal pulses.   Pulmonary/Chest: Effort normal and breath sounds normal. No stridor. No respiratory distress. She has no wheezes.  She has no rales.  Abdominal: Soft. Bowel sounds are normal. She exhibits no distension. There is generalized tenderness. There is guarding. There is no rigidity, no rebound and no CVA tenderness. No hernia.  Genitourinary: Right adnexum displays tenderness. Right adnexum displays no mass. Left adnexum displays tenderness. Left adnexum displays no mass. There is tenderness around the vagina. Vaginal discharge found.  Status post hysterectomy  Musculoskeletal: She exhibits no edema and no tenderness.  Neurological: She is alert. She has normal strength. No cranial nerve deficit (no facial droop, extraocular movements intact, no slurred speech) or sensory deficit. She exhibits normal muscle tone. She displays no seizure activity. Coordination normal.  Skin: Skin is warm and dry. No rash noted.  Psychiatric: She has a normal mood and affect.    ED Course  Procedures (including critical care time) Labs Review Labs Reviewed  WET PREP, GENITAL - Abnormal; Notable for the following:    Clue Cells Wet Prep HPF POC MANY (*)    All other components within normal limits  URINALYSIS, ROUTINE W REFLEX MICROSCOPIC - Abnormal; Notable for the following:    APPearance CLOUDY (*)    Hgb urine dipstick TRACE (*)    All other components within normal limits  URINE MICROSCOPIC-ADD ON - Abnormal; Notable for the following:    Squamous Epithelial / LPF MANY (*)    Bacteria, UA MANY (*)    All other components within normal limits  GC/CHLAMYDIA PROBE AMP  CBC WITH DIFFERENTIAL  COMPREHENSIVE METABOLIC PANEL  LIPASE, BLOOD  RPR  HIV ANTIBODY (ROUTINE TESTING)  POC URINE PREG, ED    Imaging Review Ct Abdomen Pelvis W Contrast  11/29/2013   CLINICAL DATA:  Diffuse abdominal pain, fever, history asthma, glucose 6 phosphatase deficiency, smoker  EXAM: CT ABDOMEN AND PELVIS WITH CONTRAST  TECHNIQUE: Multidetector CT imaging of the abdomen and pelvis was performed using the standard protocol following bolus  administration of intravenous contrast. Sagittal and coronal MPR images reconstructed from axial data set.  CONTRAST:  83mL OMNIPAQUE IOHEXOL 300 MG/ML SOLN PO, 122mL OMNIPAQUE IOHEXOL 300 MG/ML SOLN IV  COMPARISON:  03/06/2012  FINDINGS: Dependent atelectasis and posterior lower lobes.  Post cholecystectomy.  Liver, spleen, pancreas, kidneys, and adrenal glands normal appearance.  Normal appendix, coiled adjacent to tip of cecum.  LEFT ovarian cyst 5.4 x 3.7 x 3.7 cm.  Uterus surgically absent with unremarkable RIGHT adnexa and bladder.  Stomach and bowel loops normal appearance.  No mass, adenopathy, free fluid, or inflammatory process.  Osseous structures unremarkable.  IMPRESSION: Large new LEFT ovarian cyst 5.4 x 3.7 x 3.7 cm.  Post hysterectomy and cholecystectomy.  No addition acute intra-abdominal or intrapelvic abnormalities.   Electronically Signed   By: Elta Guadeloupe  Thornton Papas M.D.   On: 11/29/2013 12:31    Medications  0.9 %  sodium chloride infusion (0 mLs Intravenous Stopped 11/29/13 1159)    Followed by  0.9 %  sodium chloride infusion (1,000 mLs Intravenous New Bag/Given 11/29/13 1142)  HYDROmorphone (DILAUDID) injection 0.5 mg (0.5 mg Intravenous Given 11/29/13 1141)  cefTRIAXone (ROCEPHIN) injection 250 mg (not administered)  azithromycin (ZITHROMAX) powder 1 g (not administered)  ondansetron (ZOFRAN) injection 4 mg (4 mg Intravenous Given 11/29/13 1038)  iohexol (OMNIPAQUE) 300 MG/ML solution 50 mL (50 mLs Oral Contrast Given 11/29/13 1121)  iohexol (OMNIPAQUE) 300 MG/ML solution 100 mL (100 mLs Intravenous Contrast Given 11/29/13 1213)     MDM   Final diagnoses:  Left ovarian cyst  Chlamydia    The patient's CT scan shows a large ovarian cyst on the left side. Patient was exposed to chlamydia recently. She was treated in the emergency room with ceftriaxone and azithromycin.  The patient improved with treatment. We'll discharge her home.  She should follow up with her primary doctor for a  gynecology referral.  Findings and treatment plan were discussed with the patient.    Dorie Rank, MD 11/29/13 1329

## 2013-11-29 NOTE — Progress Notes (Signed)
  CARE MANAGEMENT ED NOTE 11/29/2013  Patient:  Valerie Lee, Valerie Lee   Account Number:  [id number]  Date Initiated:  11/29/2013  Documentation initiated by:  Jackelyn Poling  Subjective/Objective Assessment:   38 yr old medicare pt c/o generalized body aches x 2 days sts fever 101 yesterday also reports her spouse was diagnosed with chlamydia week ago, and she needs to be checked.     Subjective/Objective Assessment Detail:   states pcp is [facility] Dr Jeanie Cooks     Action/Plan:   EPIC updated   Action/Plan Detail:   Anticipated DC Date:  11/29/2013     Status Recommendation to Physician:   Result of Recommendation:    Other ED [place]  PCP issues  Other  Outpatient Services - Pt will follow up    Choice offered to / List presented to:            Status of service:  Completed, signed off  ED Comments:   ED Comments Detail:

## 2013-11-29 NOTE — Discharge Instructions (Signed)
Chlamydia Chlamydia is an infection. It is spread through sexual contact. Chlamydia can be in different areas of the body. These areas include the cervix, urethra, throat, or rectum. You may not know you have chlamydia because many people never develop the symptoms. Chlamydia is not difficult to treat once you know you have it. However, if it is left untreated, chlamydia can lead to more serious health problems.  CAUSES  Chlamydia is caused by bacteria. It is a sexually transmitted disease. It is passed from an infected partner during intimate contact. This contact could be with the genitals, mouth, or rectal area. Chlamydia can also be passed from mothers to babies during birth. SIGNS AND SYMPTOMS  There may not be any symptoms. This is often the case early in the infection. If symptoms develop, they may include:  Mild pain and discomfort when urinating.  Redness, soreness, and swelling (inflammation) of the rectum.  Vaginal discharge.  Painful intercourse.  Abdominal pain.  Bleeding between menstrual periods. DIAGNOSIS  To diagnose this infection, your health care provider will do a pelvic exam. Cultures will be taken of the vagina, cervix, urine, and possibly the rectum to verify the diagnosis.  TREATMENT You will be given antibiotic medicines. If you are pregnant, certain types of antibiotics will need to be avoided. Any sexual partners should also be treated, even if they do not show symptoms.  HOME CARE INSTRUCTIONS   Take your antibiotic medicine as directed by your health care provider. Finish the antibiotic even if you start to feel better.  Take medicines only as directed by your health care provider.  Inform any sexual partners about the infection. They should also be treated.  Do not have sexual contact until your health care provider tells you it is okay.  Get plenty of rest.  Eat a well-balanced diet.  Drink enough fluids to keep your urine clear or pale  yellow.  Keep all follow-up visits as directed by your health care provider. SEEK MEDICAL CARE IF:  You have painful urination.  You have abdominal pain.  You have vaginal discharge.  You have painful sexual intercourse.  You have bleeding between periods and after sex.  You have a fever. SEEK IMMEDIATE MEDICAL CARE IF:   You experience nausea or vomiting.  You experience excessive sweating (diaphoresis).  You have difficulty swallowing. MAKE SURE YOU:   Understand these instructions.  Will watch your condition.  Will get help right away if you are not doing well or get worse. Document Released: 12/22/2004 Document Revised: 07/29/2013 Document Reviewed: 11/19/2012 [facility] Patient Information 2015 [place], [place]. This information is not intended to replace advice given to you by your health care provider. Make sure you discuss any questions you have with your health care provider.  Ovarian Cyst An ovarian cyst is a sac filled with fluid or blood. This sac is attached to the ovary. Some cysts go away on their own. Other cysts need treatment.  HOME CARE   Only take medicine as told by your doctor.  Follow up with your doctor as told.  Get regular pelvic exams and Pap tests. GET HELP IF:  Your periods are late, not regular, or painful.  You stop having periods.  Your belly (abdominal) or pelvic pain does not go away.  Your belly becomes large or puffy (swollen).  You have a hard time peeing (totally emptying your bladder).  You have pressure on your bladder.  You have pain during sex.  You feel fullness, pressure, or  discomfort in your belly.  You lose weight for no reason.  You feel sick most of the time.  You have a hard time pooping (constipation).  You do not feel like eating.  You develop pimples (acne).  You have an increase in hair on your body and face.  You are gaining weight for no reason.  You think you are pregnant. GET HELP RIGHT  AWAY IF:   Your belly pain gets worse.  You feel sick to your stomach (nauseous), and you throw up (vomit).  You have a fever that comes on fast.  You have belly pain while pooping (bowel movement).  Your periods are heavier than usual. MAKE SURE YOU:   Understand these instructions.  Will watch your condition.  Will get help right away if you are not doing well or get worse. Document Released: 08/31/2007 Document Revised: 01/02/2013 Document Reviewed: 11/19/2012 [facility] Patient Information 2015 [place], [place]. This information is not intended to replace advice given to you by your health care provider. Make sure you discuss any questions you have with your health care provider.

## 2013-11-29 NOTE — ED Notes (Signed)
Pt c/o generalized body aches x 2 days, sts fever 101 yesterday, also reports her spouse was diagnosed with chlamydia week ago, and she needs to be checked.

## 2013-11-30 LAB — GC/CHLAMYDIA PROBE AMP
CT Probe RNA: NEGATIVE
GC PROBE AMP APTIMA: NEGATIVE

## 2013-12-25 ENCOUNTER — Inpatient Hospital Stay: Payer: Self-pay | Admitting: Psychiatry

## 2013-12-25 LAB — DRUG SCREEN, URINE
Amphetamines, Ur Screen: NEGATIVE (ref ?–1000)
BENZODIAZEPINE, UR SCRN: NEGATIVE (ref ?–200)
Barbiturates, Ur Screen: NEGATIVE (ref ?–200)
COCAINE METABOLITE, UR ~~LOC~~: NEGATIVE (ref ?–300)
Cannabinoid 50 Ng, Ur ~~LOC~~: POSITIVE (ref ?–50)
MDMA (Ecstasy)Ur Screen: NEGATIVE (ref ?–500)
Methadone, Ur Screen: NEGATIVE (ref ?–300)
Opiate, Ur Screen: NEGATIVE (ref ?–300)
Phencyclidine (PCP) Ur S: NEGATIVE (ref ?–25)
TRICYCLIC, UR SCREEN: NEGATIVE (ref ?–1000)

## 2013-12-25 LAB — URINALYSIS, COMPLETE
Bilirubin,UR: NEGATIVE
Blood: NEGATIVE
GLUCOSE, UR: NEGATIVE mg/dL (ref 0–75)
Ketone: NEGATIVE
Leukocyte Esterase: NEGATIVE
Nitrite: NEGATIVE
Ph: 5 (ref 4.5–8.0)
Protein: NEGATIVE
RBC,UR: 3 /HPF (ref 0–5)
SPECIFIC GRAVITY: 1.018 (ref 1.003–1.030)
WBC UR: 1 /HPF (ref 0–5)

## 2013-12-26 LAB — CBC
HCT: 40.7 % (ref 35.0–47.0)
HGB: 13.3 g/dL (ref 12.0–16.0)
MCH: 32.3 pg (ref 26.0–34.0)
MCHC: 32.7 g/dL (ref 32.0–36.0)
MCV: 99 fL (ref 80–100)
Platelet: 243 10*3/uL (ref 150–440)
RBC: 4.12 10*6/uL (ref 3.80–5.20)
RDW: 12.7 % (ref 11.5–14.5)
WBC: 9 10*3/uL (ref 3.6–11.0)

## 2013-12-26 LAB — COMPREHENSIVE METABOLIC PANEL
ALK PHOS: 54 U/L
ANION GAP: 6 — AB (ref 7–16)
AST: 17 U/L (ref 15–37)
Albumin: 3.7 g/dL (ref 3.4–5.0)
BILIRUBIN TOTAL: 0.5 mg/dL (ref 0.2–1.0)
BUN: 10 mg/dL (ref 7–18)
CALCIUM: 9 mg/dL (ref 8.5–10.1)
CREATININE: 0.95 mg/dL (ref 0.60–1.30)
Chloride: 106 mmol/L (ref 98–107)
Co2: 27 mmol/L (ref 21–32)
EGFR (African American): >60
EGFR (Non-African Amer.): >60
Glucose: 104 mg/dL — ABNORMAL HIGH (ref 65–99)
OSMOLALITY: 277 (ref 275–301)
POTASSIUM: 3.9 mmol/L (ref 3.5–5.1)
SGPT (ALT): 20 U/L
Sodium: 139 mmol/L (ref 136–145)
Total Protein: 8 g/dL (ref 6.4–8.2)

## 2013-12-26 LAB — LIPID PANEL
Cholesterol: 165 mg/dL (ref 0–200)
HDL: 51 mg/dL (ref 40–60)
Ldl Cholesterol, Calc: 87 mg/dL (ref 0–100)
Triglycerides: 135 mg/dL (ref 0–200)
VLDL Cholesterol, Calc: 27 mg/dL (ref 5–40)

## 2013-12-26 LAB — HEMOGLOBIN A1C: Hemoglobin A1C: 4.2 % (ref 4.2–6.3)

## 2013-12-26 LAB — TSH: Thyroid Stimulating Horm: 1.36 u[IU]/mL

## 2014-01-27 ENCOUNTER — Encounter (HOSPITAL_COMMUNITY): Payer: Self-pay | Admitting: Emergency Medicine

## 2014-07-19 NOTE — H&P (Signed)
PATIENT NAME:  Valerie Lee, Valerie Lee MR#:  [id number] DATE OF BIRTH:  [birth year]  DATE OF ADMISSION:  12/25/2013  REFERRING PHYSICIAN: [facility] physician.    ATTENDING PHYSICIAN:  Kristine Linea, MD.   IDENTIFYING DATA: Valerie Lee is a 39 year old female with history of schizoaffective disorder.   CHIEF COMPLAINT: "I was hallucinating."   HISTORY OF PRESENT ILLNESS:   Valerie Lee has a long history of psychosis and mood instability.  She has been a patient at [facility] in [place]. She has been stable on a combination of Abilify, Latuda, and Vistaril.  Unfortunately a month or so ago she ran out of medications. It is unclear why she did not get her refills, she gets her medicines at the [facility] pharmacy.  As her resources are limited and she does not have insurance that would pay for her medications she takes advantage of low-cost pharmacy at [facility]. She says that the cost of Latuda, Abilify and Vistaril is $15 a month and she is able to afford it. When she stayed briefly at [facility] they started her on Invega, but this is a new medication to her and she does not want to start medications that she is uncertain she would be able to afford. She reports that over the past month she has been increasingly paranoid, feeling that people are out to get her, and recently started hallucinating. She has auditory command hallucinations telling her to hurt herself and other people and explaining to her how to do it. She became frightened and asked her daughter to drive her to the crisis Center. As above she was given [place] tablet at [facility] and she felt that it was helpful, but still she would prefer to go to her medications that she can afford and she knows work for. She has been feeling increasingly depressed with poor sleep, decreased appetite, anhedonia, feeling of guilt, hopelessness, worthlessness, crying spells, poor energy and concentration, social isolation, she has always had social anxiety. She has  panic attacks that recently became more frequent. She also has symptoms of OCD, she cleans too much, organizes all of the time, spends a long time in the shower completing her rituals. She worries all the time and has racing thoughts and inability to sleep. She denies symptoms suggestive of bipolar mania. She denies alcohol, illicit drugs, or prescription pill abuse.   PAST PSYCHIATRIC HISTORY: She has been diagnosed many years ago. She has been tried on other medications, but does not remember the names. She has never attempted suicide.  FAMILY PSYCHIATRIC HISTORY: Mother with schizophrenia.   PAST MEDICAL HISTORY: None.   ALLERGIES: BACTRIM, CODEINE, IBUPROFEN, PAXIL, PENICILLIN.    MEDICATIONS ON ADMISSION:  None. Medications she should be taking, Abilify unknown dose, Latuda unknown dose, Vistaril unknown dose   SOCIAL HISTORY: She has 6 children, ages 9,15,16,18, 12, and 80.  She lives with her 4 children who are still in school. She has disability and some kind of other income, unfortunately no insurance that would cover her medications.   REVIEW OF SYSTEMS:  CONSTITUTIONAL: No fevers or chills. No weight changes.  EYES: No double or blurred vision.  ENT: No hearing loss.  RESPIRATORY: No shortness of breath or cough.  CARDIOVASCULAR: No chest pain or orthopnea.  GASTROINTESTINAL: No abdominal pain, nausea, vomiting, or diarrhea.  GENITOURINARY: No incontinence or frequency.  ENDOCRINE: No heat or cold intolerance.  LYMPHATIC: No anemia or easy bruising.  INTEGUMENTARY: No acne or rash.  MUSCULOSKELETAL: No muscle or joint pain.  NEUROLOGIC: No tingling or weakness.  PSYCHIATRIC: See history of present illness for details.   PHYSICAL EXAMINATION:  VITAL SIGNS: Blood pressure 102/70, pulse 80, respirations 16, temperature 98.1.  GENERAL: This is a slender middle-aged female in no acute distress.  HEENT: The pupils are equal, round, and reactive to light. Sclerae are anicteric.   NECK: Supple. No thyromegaly.  LUNGS: Clear to auscultation. No dullness to percussion.  HEART: Regular rhythm and rate. No murmurs, rubs, or gallops.  ABDOMEN: Soft, nontender, nondistended. Positive bowel sounds.  MUSCULOSKELETAL: Normal muscle strength in all extremities.  SKIN: No rashes or bruises.  LYMPHATIC: No cervical adenopathy.  NEUROLOGIC: Cranial nerves II-XII are intact.   LABORATORY DATA: Chemistries are within normal limits. Blood alcohol level not tested.  Lipid panel and hemoglobin A1c within normal limits. LFTs within normal limits. TSH 1.36. Urine toxicology screen positive for cannabinoids. CBC within normal limits. Urinalysis is not suggestive of urinary tract infection.   MENTAL STATUS EXAMINATION ON ADMISSION: The patient is alert and oriented to person, place, time, and situation. She is pleasant, polite, and cooperative. She is well groomed and casually dressed. She maintains good eye contact. Her speech is of normal rhythm, rate, and volume. Mood is depressed with flat affect. Thought process is logical and goal oriented. Thought content, she denies thoughts of hurting herself or others, but endorses auditory command hallucinations instructing her to hurt herself and possibly others, but she is able to contract for safety and feels safe in the hospital. She is somewhat paranoid and delusional. Her cognition is grossly intact. Registration, recall, short and long-term memory are intact. She is of average intelligence and fund of knowledge. Her insight and judgment are fair.   SUICIDE RISK ASSESSMENT ON ADMISSION: This is a patient with a long history of psychosis and depression who came to the hospital suicidal, depressed, with auditory command hallucinations in the context of treatment noncompliance.   INITIAL DIAGNOSES:   AXIS I:  1.  Schizoaffective disorder, depressed type.  2.  Cannabis use disorder.   AXIS II: Deferred.   AXIS III: Deferred.   PLAN: The  patient was admitted to [facility] behavioral medicine unit for safety, stabilization, and medication management. She was initially placed on suicide precautions and was closely monitored for any unsafe behaviors. She underwent full psychiatric and risk assessment. She received pharmacotherapy, individual and group psychotherapy, substance abuse counseling, and support from therapeutic milieu.   1.  Suicidal and homicidal ideation. She is able to contract for safety.  2.  Mood and psychosis: We will restart Abilify and Latuda.   3.  Anxiety: We will offer Vistaril and start Luvox for OCD at night.  4.  Disposition:  She will be discharged to home.     ____________________________ Ellin Goodie. Jennet Maduro, MD jbp:[id number] D: 12/26/2013 14:36:00 ET T: 12/26/2013 14:59:57 ET JOB#: [id number]  cc: Arantxa Piercey B. Jennet Maduro, MD, <Dictator> Shari Prows MD ELECTRONICALLY SIGNED 01/02/2014 0:53

## 2014-11-23 ENCOUNTER — Emergency Department (HOSPITAL_COMMUNITY): Payer: Commercial Managed Care - HMO

## 2014-11-23 ENCOUNTER — Emergency Department (HOSPITAL_COMMUNITY)
Admission: EM | Admit: 2014-11-23 | Discharge: 2014-11-24 | Disposition: A | Discharge status: Other Acute Inpatient Hospital | Payer: Commercial Managed Care - HMO | Attending: Emergency Medicine | Admitting: Emergency Medicine

## 2014-11-23 ENCOUNTER — Encounter (HOSPITAL_COMMUNITY): Payer: Self-pay

## 2014-11-23 DIAGNOSIS — F319 Bipolar disorder, unspecified: Secondary | ICD-10-CM | POA: Diagnosis not present

## 2014-11-23 DIAGNOSIS — I517 Cardiomegaly: Secondary | ICD-10-CM | POA: Diagnosis not present

## 2014-11-23 DIAGNOSIS — J45909 Unspecified asthma, uncomplicated: Secondary | ICD-10-CM | POA: Diagnosis not present

## 2014-11-23 DIAGNOSIS — Z880 Allergy status to penicillin: Secondary | ICD-10-CM | POA: Diagnosis not present

## 2014-11-23 DIAGNOSIS — Z720 Tobacco use: Secondary | ICD-10-CM | POA: Diagnosis not present

## 2014-11-23 DIAGNOSIS — Z862 Personal history of diseases of the blood and blood-forming organs and certain disorders involving the immune mechanism: Secondary | ICD-10-CM | POA: Insufficient documentation

## 2014-11-23 DIAGNOSIS — Z79899 Other long term (current) drug therapy: Secondary | ICD-10-CM | POA: Diagnosis not present

## 2014-11-23 DIAGNOSIS — F1210 Cannabis abuse, uncomplicated: Secondary | ICD-10-CM | POA: Diagnosis not present

## 2014-11-23 DIAGNOSIS — F419 Anxiety disorder, unspecified: Secondary | ICD-10-CM | POA: Insufficient documentation

## 2014-11-23 DIAGNOSIS — F200 Paranoid schizophrenia: Secondary | ICD-10-CM | POA: Diagnosis not present

## 2014-11-23 DIAGNOSIS — M25531 Pain in right wrist: Secondary | ICD-10-CM | POA: Diagnosis not present

## 2014-11-23 DIAGNOSIS — Z0389 Encounter for observation for other suspected diseases and conditions ruled out: Secondary | ICD-10-CM | POA: Diagnosis not present

## 2014-11-23 DIAGNOSIS — Z8639 Personal history of other endocrine, nutritional and metabolic disease: Secondary | ICD-10-CM | POA: Insufficient documentation

## 2014-11-23 DIAGNOSIS — R079 Chest pain, unspecified: Secondary | ICD-10-CM | POA: Diagnosis present

## 2014-11-23 DIAGNOSIS — M79674 Pain in right toe(s): Secondary | ICD-10-CM | POA: Diagnosis not present

## 2014-11-23 DIAGNOSIS — J9809 Other diseases of bronchus, not elsewhere classified: Secondary | ICD-10-CM | POA: Diagnosis not present

## 2014-11-23 LAB — I-STAT CHEM 8, ED
BUN: 10 mg/dL (ref 6–20)
CREATININE: 0.8 mg/dL (ref 0.44–1.00)
Calcium, Ion: 1.12 mmol/L (ref 1.12–1.23)
Chloride: 107 mmol/L (ref 101–111)
Glucose, Bld: 98 mg/dL (ref 65–99)
HEMATOCRIT: 42 % (ref 36.0–46.0)
HEMOGLOBIN: 14.3 g/dL (ref 12.0–15.0)
Potassium: 3.7 mmol/L (ref 3.5–5.1)
Sodium: 140 mmol/L (ref 135–145)
TCO2: 20 mmol/L (ref 0–100)

## 2014-11-23 LAB — BASIC METABOLIC PANEL
Anion gap: 5 (ref 5–15)
BUN: 11 mg/dL (ref 6–20)
CHLORIDE: 110 mmol/L (ref 101–111)
CO2: 23 mmol/L (ref 22–32)
Calcium: 9.2 mg/dL (ref 8.9–10.3)
Creatinine, Ser: 0.74 mg/dL (ref 0.44–1.00)
GFR calc Af Amer: >60 mL/min (ref >60)
GFR calc non Af Amer: >60 mL/min (ref >60)
Glucose, Bld: 99 mg/dL (ref 65–99)
POTASSIUM: 3.7 mmol/L (ref 3.5–5.1)
Sodium: 138 mmol/L (ref 135–145)

## 2014-11-23 LAB — URINE MICROSCOPIC-ADD ON

## 2014-11-23 LAB — CBC
HCT: 38.3 % (ref 36.0–46.0)
Hemoglobin: 12.9 g/dL (ref 12.0–15.0)
MCH: 32.7 pg (ref 26.0–34.0)
MCHC: 33.7 g/dL (ref 30.0–36.0)
MCV: 97.2 fL (ref 78.0–100.0)
Platelets: 293 10*3/uL (ref 150–400)
RBC: 3.94 MIL/uL (ref 3.87–5.11)
RDW: 12.4 % (ref 11.5–15.5)
WBC: 12.6 10*3/uL — ABNORMAL HIGH (ref 4.0–10.5)

## 2014-11-23 LAB — RAPID URINE DRUG SCREEN, HOSP PERFORMED
Amphetamines: NOT DETECTED
Barbiturates: NOT DETECTED
Benzodiazepines: NOT DETECTED
Cocaine: NOT DETECTED
Opiates: NOT DETECTED
Tetrahydrocannabinol: POSITIVE — AB

## 2014-11-23 LAB — I-STAT CG4 LACTIC ACID, ED: Lactic Acid, Venous: 1.44 mmol/L (ref 0.5–2.0)

## 2014-11-23 LAB — URINALYSIS, ROUTINE W REFLEX MICROSCOPIC
Bilirubin Urine: NEGATIVE
Glucose, UA: NEGATIVE mg/dL
Ketones, ur: NEGATIVE mg/dL
Nitrite: NEGATIVE
PROTEIN: NEGATIVE mg/dL
Specific Gravity, Urine: 1.016 (ref 1.005–1.030)
UROBILINOGEN UA: 0.2 mg/dL (ref 0.0–1.0)
pH: 6 (ref 5.0–8.0)

## 2014-11-23 LAB — I-STAT TROPONIN, ED: TROPONIN I, POC: 0 ng/mL (ref 0.00–0.08)

## 2014-11-23 LAB — ETHANOL

## 2014-11-23 MED ORDER — GI COCKTAIL ~~LOC~~
30.0000 mL | Freq: Once | ORAL | Status: AC
  Administered 2014-11-23: 30 mL via ORAL
  Filled 2014-11-23: qty 30

## 2014-11-23 MED ORDER — ZOLPIDEM TARTRATE 5 MG PO TABS
5.0000 mg | ORAL_TABLET | Freq: Every evening | ORAL | Status: DC | PRN
  Administered 2014-11-23: 5 mg via ORAL
  Filled 2014-11-23: qty 1

## 2014-11-23 MED ORDER — ONDANSETRON HCL 4 MG PO TABS: 4.0000 mg | ORAL_TABLET | Freq: Three times a day (TID) | ORAL | Status: DC | PRN

## 2014-11-23 MED ORDER — ACETAMINOPHEN 325 MG PO TABS
650.0000 mg | ORAL_TABLET | ORAL | Status: DC | PRN
  Administered 2014-11-23: 650 mg via ORAL
  Filled 2014-11-23: qty 2

## 2014-11-23 MED ORDER — NICOTINE 21 MG/24HR TD PT24: 21.0000 mg | MEDICATED_PATCH | Freq: Every day | TRANSDERMAL | Status: DC

## 2014-11-23 MED ORDER — PANTOPRAZOLE SODIUM 40 MG IV SOLR
40.0000 mg | Freq: Once | INTRAVENOUS | Status: AC
  Administered 2014-11-23: 40 mg via INTRAVENOUS
  Filled 2014-11-23: qty 40

## 2014-11-23 MED ORDER — ALUM & MAG HYDROXIDE-SIMETH 200-200-20 MG/5ML PO SUSP: 30.0000 mL | ORAL | Status: DC | PRN

## 2014-11-23 MED ORDER — MORPHINE SULFATE (PF) 2 MG/ML IV SOLN
2.0000 mg | Freq: Once | INTRAVENOUS | Status: AC
  Administered 2014-11-23: 2 mg via INTRAVENOUS
  Filled 2014-11-23: qty 1

## 2014-11-23 MED ORDER — LORAZEPAM 1 MG PO TABS
1.0000 mg | ORAL_TABLET | Freq: Three times a day (TID) | ORAL | Status: DC | PRN
Start: 2014-11-23 — End: 2014-11-24
  Administered 2014-11-23 – 2014-11-24 (×3): 1 mg via ORAL
  Filled 2014-11-23 (×3): qty 1

## 2014-11-23 NOTE — ED Notes (Signed)
Bed: WA15 Expected date:  Expected time:  Means of arrival:  Comments: Hold triage 2 
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
## 2014-11-23 NOTE — ED Notes (Signed)
Pt's [employer]. Joya Gaskins [phone]  Pt's Assistant Delos Haring F. Sarita Haver  [phone]     -Sts she will have the Pt's children while the Pt is in our care

## 2014-11-23 NOTE — ED Notes (Addendum)
Pt extremely (!!) anxious and jittery.  She is hyperfocused on her surroundings.  The two ladies with her are family church members she states she has no other family other than her daughters (3).  She also revealed as I was attempting to start her IV, that she has an injury to her right wrist and index finger and left great toe.  This happened about a month ago and she does not recall.  Initially, she did not want the doctors to know and I told her it was completely up to her, she finally agreed and asked me to let the MD know.  She is tearful.  She continued to see a man or men around her that she fears.  Church members are very encouraging assisting her to slow her breathing and singing gospel hymns.  This calms her slightly.  Dr. Audie Pinto made aware of the injuries.  Also, pt is having difficulty urinating.  I obtained a small sample and sent to lab.  Urine was cloudy.  With anxiety attack patient c/o chest pain that feels like someone standing on her    She states "she has been off her medications for a long time".

## 2014-11-23 NOTE — ED Notes (Signed)
Church members took all belongings except pts Bible.

## 2014-11-23 NOTE — BH Assessment (Addendum)
Contacted the following facilities for placement:  AT [facility]: [facility], per [facility], per [facility], per [facility], per [facility], per [facility], per [facility], per [facility], per [facility], per [facility], per [facility], per [facility], per [facility], per [facility], per [facility], per [facility], per [facility], per [place] [facility], per [facility], per [facility], per [facility], per [facility], per [facility], [facility], [facility], [facility] Triage Specialist [phone]

## 2014-11-23 NOTE — ED Notes (Signed)
MD at bedside, pt sts, "that men scare her right now and she doesn't want to be alone with a female EDP."
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
## 2014-11-23 NOTE — ED Notes (Signed)
X-ray at bedside

## 2014-11-23 NOTE — BH Assessment (Signed)
Completed assessment. Consulted with Waylan Boga, DNP who recommends inpatient treatment at this time.  Informed EDP of disposition.    Rosalin Hawking, LCSW Therapeutic Triage Specialist [place] 11/23/2014 3:50 PM

## 2014-11-24 ENCOUNTER — Inpatient Hospital Stay (HOSPITAL_COMMUNITY)
Admission: AD | Admit: 2014-11-24 | Discharge: 2014-12-01 | DRG: 885 | Disposition: A | Discharge status: Home / Self Care | Payer: Commercial Managed Care - HMO | Source: Intra-hospital | Attending: Psychiatry | Admitting: Psychiatry

## 2014-11-24 ENCOUNTER — Encounter (HOSPITAL_COMMUNITY): Payer: Self-pay

## 2014-11-24 DIAGNOSIS — F17210 Nicotine dependence, cigarettes, uncomplicated: Secondary | ICD-10-CM | POA: Diagnosis present

## 2014-11-24 DIAGNOSIS — F1210 Cannabis abuse, uncomplicated: Secondary | ICD-10-CM | POA: Diagnosis not present

## 2014-11-24 DIAGNOSIS — Z880 Allergy status to penicillin: Secondary | ICD-10-CM | POA: Diagnosis not present

## 2014-11-24 DIAGNOSIS — Z79899 Other long term (current) drug therapy: Secondary | ICD-10-CM | POA: Diagnosis not present

## 2014-11-24 DIAGNOSIS — E221 Hyperprolactinemia: Secondary | ICD-10-CM | POA: Clinically undetermined

## 2014-11-24 DIAGNOSIS — F419 Anxiety disorder, unspecified: Secondary | ICD-10-CM | POA: Diagnosis not present

## 2014-11-24 DIAGNOSIS — F251 Schizoaffective disorder, depressive type: Principal | ICD-10-CM | POA: Diagnosis present

## 2014-11-24 DIAGNOSIS — F200 Paranoid schizophrenia: Secondary | ICD-10-CM | POA: Diagnosis not present

## 2014-11-24 DIAGNOSIS — J45909 Unspecified asthma, uncomplicated: Secondary | ICD-10-CM | POA: Diagnosis not present

## 2014-11-24 DIAGNOSIS — F319 Bipolar disorder, unspecified: Secondary | ICD-10-CM | POA: Diagnosis not present

## 2014-11-24 DIAGNOSIS — Z720 Tobacco use: Secondary | ICD-10-CM | POA: Diagnosis not present

## 2014-11-24 DIAGNOSIS — Z862 Personal history of diseases of the blood and blood-forming organs and certain disorders involving the immune mechanism: Secondary | ICD-10-CM | POA: Diagnosis not present

## 2014-11-24 DIAGNOSIS — F250 Schizoaffective disorder, bipolar type: Secondary | ICD-10-CM | POA: Diagnosis present

## 2014-11-24 LAB — I-STAT TROPONIN, ED: Troponin i, poc: 0 ng/mL (ref 0.00–0.08)

## 2014-11-24 MED ORDER — MAGNESIUM HYDROXIDE 400 MG/5ML PO SUSP: 30.0000 mL | Freq: Every day | ORAL | Status: DC | PRN

## 2014-11-24 MED ORDER — ONDANSETRON HCL 4 MG PO TABS: 4.0000 mg | ORAL_TABLET | Freq: Three times a day (TID) | ORAL | Status: DC | PRN

## 2014-11-24 MED ORDER — AMANTADINE HCL 100 MG PO CAPS
100.0000 mg | ORAL_CAPSULE | Freq: Two times a day (BID) | ORAL | Status: DC
  Administered 2014-11-24: 100 mg via ORAL
  Filled 2014-11-24 (×3): qty 1

## 2014-11-24 MED ORDER — HYDROXYZINE HCL 25 MG PO TABS: 25.0000 mg | ORAL_TABLET | Freq: Four times a day (QID) | ORAL | Status: DC | PRN

## 2014-11-24 MED ORDER — ALUM & MAG HYDROXIDE-SIMETH 200-200-20 MG/5ML PO SUSP: 30.0000 mL | ORAL | Status: DC | PRN

## 2014-11-24 MED ORDER — FLUPHENAZINE HCL 2.5 MG PO TABS
2.5000 mg | ORAL_TABLET | Freq: Two times a day (BID) | ORAL | Status: DC
  Filled 2014-11-24 (×2): qty 1

## 2014-11-24 MED ORDER — METRONIDAZOLE 500 MG PO TABS
2000.0000 mg | ORAL_TABLET | Freq: Once | ORAL | Status: AC
  Administered 2014-11-24: 2000 mg via ORAL
  Filled 2014-11-24: qty 4

## 2014-11-24 MED ORDER — FLUPHENAZINE HCL 2.5 MG PO TABS
2.5000 mg | ORAL_TABLET | Freq: Two times a day (BID) | ORAL | Status: DC
  Administered 2014-11-24 – 2014-11-25 (×2): 2.5 mg via ORAL
  Filled 2014-11-24 (×5): qty 1

## 2014-11-24 MED ORDER — HYDROXYZINE HCL 25 MG PO TABS
25.0000 mg | ORAL_TABLET | Freq: Four times a day (QID) | ORAL | Status: DC | PRN
  Administered 2014-11-25 – 2014-11-30 (×4): 25 mg via ORAL
  Filled 2014-11-24 (×4): qty 1

## 2014-11-24 MED ORDER — ACETAMINOPHEN 325 MG PO TABS
650.0000 mg | ORAL_TABLET | Freq: Four times a day (QID) | ORAL | Status: DC | PRN
  Administered 2014-11-25 – 2014-11-30 (×9): 650 mg via ORAL
  Filled 2014-11-24 (×9): qty 2

## 2014-11-24 MED ORDER — NICOTINE 21 MG/24HR TD PT24
21.0000 mg | MEDICATED_PATCH | Freq: Every day | TRANSDERMAL | Status: DC
  Administered 2014-11-25 – 2014-11-26 (×2): 21 mg via TRANSDERMAL
  Filled 2014-11-24 (×5): qty 1

## 2014-11-24 MED ORDER — FLUPHENAZINE HCL 2.5 MG PO TABS
2.5000 mg | ORAL_TABLET | Freq: Two times a day (BID) | ORAL | Status: DC
  Administered 2014-11-24: 2.5 mg via ORAL
  Filled 2014-11-24 (×3): qty 1

## 2014-11-24 MED ORDER — ALBUTEROL SULFATE HFA 108 (90 BASE) MCG/ACT IN AERS: 2.0000 | INHALATION_SPRAY | Freq: Four times a day (QID) | RESPIRATORY_TRACT | Status: DC | PRN

## 2014-11-24 MED ORDER — METRONIDAZOLE 500 MG PO TABS: 2000.0000 mg | ORAL_TABLET | Freq: Once | ORAL | Status: DC

## 2014-11-24 MED ORDER — AMANTADINE HCL 100 MG PO CAPS
100.0000 mg | ORAL_CAPSULE | Freq: Two times a day (BID) | ORAL | Status: DC
  Administered 2014-11-24 – 2014-11-28 (×8): 100 mg via ORAL
  Filled 2014-11-24 (×11): qty 1

## 2014-11-24 MED ORDER — ACETAMINOPHEN 325 MG PO TABS: 650.0000 mg | ORAL_TABLET | ORAL | Status: DC | PRN

## 2014-11-24 NOTE — Progress Notes (Signed)
The focus of this group is to help patients review their daily goal of treatment and discuss progress on daily workbooks. Pt did not attend the evening group. 
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
## 2014-11-24 NOTE — Progress Notes (Signed)
Patient ID: PANDA CROSSIN, female   DOB: 09/22/1975, 39 y.o.   MRN: [id number]  D: Patient was lying in bed on approach tonight. She has been in bed since shift change. She woke to speak with undersign and to take medication. She reports that she has been hearing voices and that is why she came here.  A: Staff will continue to monitor on q 15 minute checks, follow treatment plan, and give medications as ordered R: Took prolixin and cogentin without any issues

## 2014-11-24 NOTE — Consult Note (Signed)
[facility] Consult   Reason for Consult:  Hallucinations Referring Physician:  EDP Patient Identification: Valerie Lee MRN:  [id number] Principal Diagnosis: Schizophrenia, paranoid, chronic with acute exacerbation Diagnosis:   Patient Active Problem List   Diagnosis Date Noted  . Schizophrenia, paranoid, chronic with acute exacerbation [F20.0] 11/24/2014    Priority: High  . Depressive disorder, not elsewhere classified [F32.9] 03/07/2013  . Fibroid uterus [D25.9] 05/12/2011  . Pelvic pain [R10.2] 05/12/2011    Total Time spent with patient: 45 minutes  Subjective:   Valerie Lee is a 39 y.o. female patient admitted with psychosis.  HPI:   39 y.o. female who presents to [facility] voluntary with members of her church due to having auditory and visual hallucinations of someone telling her that "it's ok" to hurt herself. Patient states that she has been feeling this way for a "cfouple days" and has became increasingly anxious and saw "the man" today. Patient refused to answer questions regarding hurting herself or someone else, however, patient states that she does not have the desire to hurt herself or anyone but has attempted to hurt herself "a couple" times in the past. Patient states that she does not have any weapons in the home and does not have a history of haring others. Patient states that she has a history of schizophrenia and has been treated at [facility] on an outpatient basis "for a while." Patient states that she has not been to [facility] or taken her medications since February but notes that she is much better when taking her prescribed medications. Patient continue to complain of anxiety and chest pain during the assessments. Patient states that she has been stressed "about a lot of things" but declined to elaborate. Patient states that "I've been praying about everything and I know that God will help me." Patient states that she was previously prescribed  Latuda, Abilify, and "something to make me not think too much" previously and that combination worked for her. Patient states that she was not on an injection. Patient states that she has not been to [facility] or taken her medication since her mother passed away. Patient states that her mother and uncle have also been diagnosed with schizophrenia. Patient states that she knows that someone is following her and requested that this writer keep the door closed during her assessment. Patient denied use of substances. Patient ETOH <5 clear and UDS was not ordered at time of the assessment. During the assessment patient states that "the man" "is telling me to hurt you guys, but i keep praying." Patient cannot contract for safety at time.   Today:  The patient continues to see a man and hides under her covers because he is "scarry".  She has not been on her medications since February.  Last regiment was Latuda, Abilify, 3 sleeping pills.  She was to start an Abilify injection but did not.  Patient at [facility].  HPI Elements:   Location:  generalized. Quality:  acute. Severity:  severe. Timing:  constant. Duration:  two days. Context:  not taking her medications.  Past Medical History:  Past Medical History  Diagnosis Date  . G6PD (glucose 6 phosphatase deficiency)   . Anemia   . G6PD deficiency anemia   . Blood transfusion   . Asthma     rarely uses albuterol rescue  . Mental disorder   . Schizophrenia   . Schizophrenia   . Bipolar 1 disorder     Past Surgical History  Procedure  Laterality Date  . Tubal ligation    . Cholecystectomy  2011  . Abdominal hysterectomy  05/26/2011    Procedure: HYSTERECTOMY ABDOMINAL;  Surgeon: Mora Bellman, MD;  Location: [facility];  Service: Gynecology;  Laterality: N/A;  . Tubal ligation     Family History:  Family History  Problem Relation Age of Onset  . Hypertension Mother    Social History:  History  Alcohol Use No     History  Drug Use  . Yes  .  Special: Marijuana    Social History   Social History  . Marital Status: Married    Spouse Name: N/A  . Number of Children: N/A  . Years of Education: N/A   Social History Main Topics  . Smoking status: Current Every Day Smoker -- 0.20 packs/day for 15 years    Types: Cigarettes  . Smokeless tobacco: Never Used  . Alcohol Use: No  . Drug Use: Yes    Special: Marijuana  . Sexual Activity: Yes    Birth Control/ Protection: Surgical   Other Topics Concern  . None   Social History Narrative   Additional Social History:    Pain Medications: See PTA Prescriptions: See PTA Over the Counter: See PTA History of alcohol / drug use?: No history of alcohol / drug abuse                     Allergies:   Allergies  Allergen Reactions  . Bactrim Anaphylaxis, Itching and Swelling  . Blue Dyes (Parenteral) Anaphylaxis and Shortness Of Breath  . Codeine Itching and Swelling    Hallucinations Pt can take vicodin with no reaction  . Penicillins Anaphylaxis, Itching and Swelling  . Aspirin Swelling  . Ibuprofen Other (See Comments)    Lowers hemoglobin - pt has G6PD deficiency    Labs:  Results for orders placed or performed during the hospital encounter of 11/23/14 (from the past 48 hour(s))  Basic metabolic panel     Status: None   Collection Time: 11/23/14 12:53 PM  Result Value Ref Range   Sodium 138 135 - 145 mmol/L   Potassium 3.7 3.5 - 5.1 mmol/L   Chloride 110 101 - 111 mmol/L   CO2 23 22 - 32 mmol/L   Glucose, Bld 99 65 - 99 mg/dL   BUN 11 6 - 20 mg/dL   Creatinine, Ser 0.74 0.44 - 1.00 mg/dL   Calcium 9.2 8.9 - 10.3 mg/dL   GFR calc non Af Amer >60 >60 mL/min   GFR calc Af Amer >60 >60 mL/min    Comment: (NOTE) The eGFR has been calculated using the CKD EPI equation. This calculation has not been validated in all clinical situations. eGFR's persistently <60 mL/min signify possible Chronic Kidney Disease.    Anion gap 5 5 - 15  CBC     Status: Abnormal    Collection Time: 11/23/14 12:53 PM  Result Value Ref Range   WBC 12.6 (H) 4.0 - 10.5 K/uL   RBC 3.94 3.87 - 5.11 MIL/uL   Hemoglobin 12.9 12.0 - 15.0 g/dL   HCT 38.3 36.0 - 46.0 %   MCV 97.2 78.0 - 100.0 fL   MCH 32.7 26.0 - 34.0 pg   MCHC 33.7 30.0 - 36.0 g/dL   RDW 12.4 11.5 - 15.5 %   Platelets 293 150 - 400 K/uL  Ethanol     Status: None   Collection Time: 11/23/14 12:58 PM  Result Value Ref Range  Alcohol, Ethyl (B) <5 <5 mg/dL    Comment:        LOWEST DETECTABLE LIMIT FOR SERUM ALCOHOL IS 5 mg/dL FOR MEDICAL PURPOSES ONLY   I-stat troponin, ED     Status: None   Collection Time: 11/23/14  1:02 PM  Result Value Ref Range   Troponin i, poc 0.00 0.00 - 0.08 ng/mL   Comment 3            Comment: Due to the release kinetics of cTnI, a negative result within the first hours of the onset of symptoms does not rule out myocardial infarction with certainty. If myocardial infarction is still suspected, repeat the test at appropriate intervals.   I-stat chem 8, ed     Status: None   Collection Time: 11/23/14  1:05 PM  Result Value Ref Range   Sodium 140 135 - 145 mmol/L   Potassium 3.7 3.5 - 5.1 mmol/L   Chloride 107 101 - 111 mmol/L   BUN 10 6 - 20 mg/dL   Creatinine, Ser 0.80 0.44 - 1.00 mg/dL   Glucose, Bld 98 65 - 99 mg/dL   Calcium, Ion 1.12 1.12 - 1.23 mmol/L   TCO2 20 0 - 100 mmol/L   Hemoglobin 14.3 12.0 - 15.0 g/dL   HCT 42.0 36.0 - 46.0 %  I-Stat CG4 Lactic Acid, ED     Status: None   Collection Time: 11/23/14  1:05 PM  Result Value Ref Range   Lactic Acid, Venous 1.44 0.5 - 2.0 mmol/L  Urinalysis, Routine w reflex microscopic (not at [facility])     Status: Abnormal   Collection Time: 11/23/14  1:47 PM  Result Value Ref Range   Color, Urine YELLOW YELLOW   APPearance TURBID (A) CLEAR   Specific Gravity, Urine 1.016 1.005 - 1.030   pH 6.0 5.0 - 8.0   Glucose, UA NEGATIVE NEGATIVE mg/dL   Hgb urine dipstick SMALL (A) NEGATIVE   Bilirubin Urine NEGATIVE  NEGATIVE   Ketones, ur NEGATIVE NEGATIVE mg/dL   Protein, ur NEGATIVE NEGATIVE mg/dL   Urobilinogen, UA 0.2 0.0 - 1.0 mg/dL   Nitrite NEGATIVE NEGATIVE   Leukocytes, UA SMALL (A) NEGATIVE  Urine microscopic-add on     Status: Abnormal   Collection Time: 11/23/14  1:47 PM  Result Value Ref Range   Squamous Epithelial / LPF MANY (A) RARE   WBC, UA 3-6 <3 WBC/hpf   Bacteria, UA FEW (A) RARE   Urine-Other AMORPHOUS URATES/PHOSPHATES     Comment: TRICHOMONAS PRESENT  I-Stat Troponin, ED (not at [facility])     Status: None   Collection Time: 11/23/14  5:26 PM  Result Value Ref Range   Troponin i, poc 0.00 0.00 - 0.08 ng/mL   Comment 3            Comment: Due to the release kinetics of cTnI, a negative result within the first hours of the onset of symptoms does not rule out myocardial infarction with certainty. If myocardial infarction is still suspected, repeat the test at appropriate intervals.   Urine rapid drug screen (hosp performed)     Status: Abnormal   Collection Time: 11/23/14  6:03 PM  Result Value Ref Range   Opiates NONE DETECTED NONE DETECTED   Cocaine NONE DETECTED NONE DETECTED   Benzodiazepines NONE DETECTED NONE DETECTED   Amphetamines NONE DETECTED NONE DETECTED   Tetrahydrocannabinol POSITIVE (A) NONE DETECTED   Barbiturates NONE DETECTED NONE DETECTED    Comment:  DRUG SCREEN FOR MEDICAL PURPOSES ONLY.  IF CONFIRMATION IS NEEDED FOR ANY PURPOSE, NOTIFY LAB WITHIN 5 DAYS.        LOWEST DETECTABLE LIMITS FOR URINE DRUG SCREEN Drug Class       Cutoff (ng/mL) Amphetamine      1000 Barbiturate      200 Benzodiazepine   220 Tricyclics       254 Opiates          300 Cocaine          300 THC              50     Vitals: Blood pressure 103/62, pulse 80, temperature 98.3 F (36.8 C), temperature source Oral, resp. rate 18, last menstrual period 04/12/2011, SpO2 99 %.  Risk to Self: Suicidal Ideation:  (UTA) Suicidal Intent:  (UTA) Is patient at risk for  suicide?: Yes Suicidal Plan?:  (UTA) Access to Means: No What has been your use of drugs/alcohol within the last 12 months?: UTA How many times?:  (couple- 2 years ago) Other Self Harm Risks: UTA Triggers for Past Attempts:  (UTA) Intentional Self Injurious Behavior: Cutting Comment - Self Injurious Behavior: cut "about two years ago Risk to Others: Homicidal Ideation:  (UTA) Thoughts of Harm to Others:  (UTA) Current Homicidal Intent:  (UTA) Current Homicidal Plan:  (UTA) Access to Homicidal Means:  (UTA) Identified Victim: UTA History of harm to others?: No Assessment of Violence: None Noted Violent Behavior Description: Denies Does patient have access to weapons?: No Criminal Charges Pending?: No Does patient have a court date: No Prior Inpatient Therapy: Prior Inpatient Therapy: Yes Prior Therapy Dates: March/April 2016 Prior Therapy Facilty/Provider(s): [facility]) Reason for Treatment: Schizophrenia Prior Outpatient Therapy: Prior Outpatient Therapy: Yes Prior Therapy Dates: Present Prior Therapy Facilty/Provider(s): [facility] Reason for Treatment: Schizophrenia Does patient have an ACCT team?: No Does patient have Intensive In-House Services?  : No Does patient have [facility] services? : Yes Does patient have P4CC services?: No  Current Facility-Administered Medications  Medication Dose Route Frequency Provider Last Rate Last Dose  . acetaminophen (TYLENOL) tablet 650 mg  650 mg Oral Q4H PRN Leonard Schwartz, MD   650 mg at 11/23/14 2107  . alum & mag hydroxide-simeth (MAALOX/MYLANTA) 200-200-20 MG/5ML suspension 30 mL  30 mL Oral PRN Leonard Schwartz, MD      . amantadine (SYMMETREL) capsule 100 mg  100 mg Oral BID Becka Lagasse      . fluPHENAZine (PROLIXIN) tablet 2.5 mg  2.5 mg Oral BID PC Patrecia Pour, NP      . hydrOXYzine (ATARAX/VISTARIL) tablet 25 mg  25 mg Oral Q6H PRN Gabrien Mentink      . nicotine (NICODERM CQ - dosed in mg/24 hours) patch 21 mg  21 mg  Transdermal Daily Leonard Schwartz, MD   21 mg at 11/23/14 1837  . ondansetron (ZOFRAN) tablet 4 mg  4 mg Oral Q8H PRN Leonard Schwartz, MD       Current Outpatient Prescriptions  Medication Sig Dispense Refill  . ARIPiprazole (ABILIFY) 5 MG tablet Take 10 mg by mouth daily.    . citalopram (CELEXA) 20 MG tablet Take 1 tablet (20 mg total) by mouth daily. (Patient not taking: Reported on 11/23/2014) 30 tablet 0  . haloperidol (HALDOL) 5 MG tablet Take 1 tablet (5 mg total) by mouth 2 (two) times daily. For mood control. (Patient not taking: Reported on 11/23/2014) 60 tablet 0  . hydrOXYzine (ATARAX/VISTARIL) 25 MG tablet  Take 1 tablet (25 mg total) by mouth 2 (two) times daily after a meal. (Patient not taking: Reported on 11/23/2014) 30 tablet 0  . Lurasidone HCl (LATUDA PO) Take 1 tablet by mouth daily.    . modafinil (PROVIGIL) 200 MG tablet Take 200 mg by mouth daily.    . prazosin (MINIPRESS) 1 MG capsule Take 1 mg by mouth at bedtime.    . traZODone (DESYREL) 50 MG tablet Take 1 tablet (50 mg total) by mouth at bedtime as needed for sleep. (Patient not taking: Reported on 11/23/2014) 30 tablet 0    Musculoskeletal: Strength & Muscle Tone: within normal limits Gait & Station: normal Patient leans: N/A  Psychiatric Specialty Exam: Physical Exam  Review of Systems  Constitutional: Negative.   HENT: Negative.   Eyes: Negative.   Respiratory: Negative.   Cardiovascular: Negative.   Gastrointestinal: Negative.   Genitourinary: Negative.   Musculoskeletal: Negative.   Skin: Negative.   Neurological: Negative.   Endo/Heme/Allergies: Negative.   Psychiatric/Behavioral: Positive for hallucinations. The patient is nervous/anxious.     Blood pressure 103/62, pulse 80, temperature 98.3 F (36.8 C), temperature source Oral, resp. rate 18, last menstrual period 04/12/2011, SpO2 99 %.There is no weight on file to calculate BMI.  General Appearance: Disheveled  Eye Contact::  Minimal  Speech:   Normal Rate  Volume:  Normal  Mood:  Anxious and scared from halluncinations  Affect:  Congruent  Thought Process:  Coherent  Orientation:  Full (Time, Place, and Person)  Thought Content:  Hallucinations: Auditory Visual  Suicidal Thoughts:  No  Homicidal Thoughts:  No  Memory:  Immediate;   Good Recent;   Good Remote;   Good  Judgement:  Impaired  Insight:  Fair  Psychomotor Activity:  Decreased  Concentration:  Fair  Recall:  [place] of Knowledge:Good  Language: Good  Akathisia:  No  Handed:  Right  AIMS (if indicated):     Assets:  Leisure Time Physical Health Resilience  ADL's:  Intact  Cognition: Impaired,  Mild  Sleep:      Medical Decision Making: Review of Psycho-Social Stressors (1), Review or order clinical lab tests (1) and Review of Medication Regimen & Side Effects (2)  Treatment Plan Summary: Daily contact with patient to assess and evaluate symptoms and progress in treatment, Medication management and Plan Paranoid schizophrenia, chronic, with acute exacerbation:  -Start Prolixin 2.5 mg BID for psychosis, amantadine 100 mg daily to prevent EPS, Vistaril 25 mg every six hours PRN anxiety  Plan:  Recommend psychiatric Inpatient admission when medically cleared. Disposition: Admit to [facility] for stabilization  Waylan Boga, [place] 11/24/2014 12:19 PM Patient seen face-to-face for psychiatric evaluation, chart reviewed and case discussed with the physician extender and developed treatment plan. Reviewed the information documented and agree with the treatment plan. Corena Pilgrim, MD

## 2014-11-24 NOTE — ED Notes (Signed)
Pt oriented to room and unit.  Pt is very anxious and says she is scared of men.  15 minute checks in place.
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
## 2014-11-24 NOTE — Tx Team (Signed)
Initial Interdisciplinary Treatment Plan   PATIENT STRESSORS: Financial difficulties Loss of mom in February Traumatic event   PATIENT STRENGTHS: Ability for insight Agricultural engineer for treatment/growth Religious Affiliation Supportive church family   PROBLEM LIST: Problem List/Patient Goals Date to be addressed Date deferred Reason deferred Estimated date of resolution  "voices telling me to harm myself" December 06, 2014     "voices don't like other people" December 06, 2014     "don't want to go on" after mother's death 12/06/14                                          DISCHARGE CRITERIA:  Improved stabilization in mood, thinking, and/or behavior  PRELIMINARY DISCHARGE PLAN: Outpatient therapy  PATIENT/FAMIILY INVOLVEMENT: This treatment plan has been presented to and reviewed with the patient, Valerie Lee.  The patient and family have been given the opportunity to ask questions and make suggestions.  Marya Landry 12-06-2014, 4:45 PM

## 2014-11-25 ENCOUNTER — Encounter (HOSPITAL_COMMUNITY): Payer: Self-pay | Admitting: Psychiatry

## 2014-11-25 DIAGNOSIS — F251 Schizoaffective disorder, depressive type: Principal | ICD-10-CM

## 2014-11-25 MED ORDER — FLUPHENAZINE HCL 5 MG PO TABS
5.0000 mg | ORAL_TABLET | Freq: Two times a day (BID) | ORAL | Status: DC
  Administered 2014-11-25 – 2014-11-28 (×6): 5 mg via ORAL
  Filled 2014-11-25 (×9): qty 1

## 2014-11-25 MED ORDER — LAMOTRIGINE 25 MG PO TABS
25.0000 mg | ORAL_TABLET | Freq: Every day | ORAL | Status: DC
  Administered 2014-11-25 – 2014-11-28 (×4): 25 mg via ORAL
  Filled 2014-11-25 (×10): qty 1

## 2014-11-25 NOTE — Tx Team (Signed)
Interdisciplinary Treatment Plan Update (Adult)  Date:  11/25/2014   Time Reviewed:  10:34 AM   Progress in Treatment: Attending groups: Yes. Participating in groups:  Yes. Taking medication as prescribed:  Yes. Tolerating medication:  Yes. Family/Significant othe contact made:  No Patient understands diagnosis:  Yes  As evidenced by seeking help with command hallucinations Discussing patient identified problems/goals with staff:  Yes, see initial care plan. Medical problems stabilized or resolved:  Yes. Denies suicidal/homicidal ideation: Yes. Issues/concerns per patient self-inventory:  No. Other:  New problem(s) identified:  Discharge Plan or Barriers: return home, follow up outpt  Reason for Continuation of Hospitalization: Depression Hallucinations Medication stabilization  Comments:  Subjective:  " I still have AH asking me to hurt myself and I could not sleep due to my pain last night.'  Valerie Lee is a 39 y.o.AA female who presented to [facility] with members of her church due to having auditory and visual hallucinations of someone telling her that "it's ok" to hurt herself. Patient states that she has been feeling this way for a "couple days" and has became increasingly anxious and saw "the man" today. The patient has a long history of psychosis and mood instability. Patient has been patient in the past at [facility] and was stable on a combination of Abilify, Latuda, and Vistaril but was unable to afford the medications.  Prolixin, Lamictal trial Estimated length of stay: 4-5 days  New goal(s):  Review of initial/current patient goals per problem list:   Review of initial/current patient goals per problem list:  1. Goal(s): Patient will participate in aftercare plan   Met: Yes   Target date: 3-5 days post admission date   As evidenced by: Patient will participate within aftercare plan AEB aftercare provider and housing plan at discharge being  identified.  11/25/2014: Pt plans to return home, follow up outpt.    2. Goal (s): Patient will exhibit decreased depressive symptoms and suicidal ideations.   Met: No   Target date: 3-5 days post admission date   As evidenced by: Patient will utilize self rating of depression at 3 or below and demonstrate decreased signs of depression or be deemed stable for discharge by MD. 11/26/2014 Seth Bake rates her depression at a 6 today.    5. Goal(s): Patient will demonstrate decreased signs of psychosis  * Met: Progressing  * Target date: 3-5 days post admission date  * As evidenced by: Patient will demonstrate decreased frequency of AVH or return to baseline function 11/26/14  Raziah states the command hallucinations have disappeared, but is still c/o AH   Willing to take meds       Attendees: Patient:  11/25/2014 10:34 AM   Family:   11/25/2014 10:34 AM   Physician:  Ursula Alert, MD 11/25/2014 10:34 AM   Nursing:   Gaylan Gerold, RN 11/25/2014 10:34 AM   CSW:    Roque Lias, LCSW   11/25/2014 10:34 AM   Other:  11/25/2014 10:34 AM   Other:   11/25/2014 10:34 AM   Other:  Lars Pinks, Nurse CM 11/25/2014 10:34 AM   Other:  Lucinda Dell, [facility] 11/25/2014 10:34 AM   Other:  Norberto Sorenson, [facility]  11/25/2014 10:34 AM   Other:  11/25/2014 10:34 AM   Other:  11/25/2014 10:34 AM   Other:  11/25/2014 10:34 AM   Other:  11/25/2014 10:34 AM   Other:  11/25/2014 10:34 AM   Other:   11/25/2014 10:34 AM  Scribe for Treatment Team:   Trish Mage, 11/25/2014 10:34 AM
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
## 2014-11-25 NOTE — Progress Notes (Signed)
D  Pt is depressed and anxious    She has a worried pained expression on her face but denies hallucinations and suicidal ideation   She is polite and pleasant on approach   She has been cooperative and compliant with treatment  A   Verbal support given   Medications administered and effectiveness monitored   Q 15 min checks   Educate on medications  R   Pt verbalizes understanding and is presently safe

## 2014-11-25 NOTE — Progress Notes (Signed)
D: Patient present with sad and depressed affect.  Endorses visual and auditory hallucinations.  Complain of headache and hip pain.  Rates depression and hopelessness at 8 out of 10; anxiety at 7 out 10.  Patient report goal for today is to attend groups if she is not in pain.  Tylenol 650 mg given for pain with fair effect.   A: maintained on routine safety checks for safety.  Medications given as prescribed.  Support and encouragement offered as needed. R: patient safe and resting in bed.

## 2014-11-25 NOTE — BHH Group Notes (Signed)
[place] LCSW Group Therapy  11/25/2014 , 1:21 PM   Type of Therapy:  Group Therapy  Participation Level:  Invited.  Chose to not attend  Summary of Progress/Problems: Today's group focused on the term Diagnosis.  Participants were asked to define the term, and then pronounce whether it is a negative, positive or neutral term.  Roque Lias B 11/25/2014 , 1:21 PM

## 2014-11-25 NOTE — BHH Group Notes (Signed)
The focus of this group is to educate the patient on the purpose and policies of crisis stabilization and provide a format to answer questions about their admission.  The group details unit policies and expectations of patients while admitted.  Patient did not attend 0900 nurse education orientation group this morning.  Patient stayed in bed.   

## 2014-11-25 NOTE — BHH Suicide Risk Assessment (Signed)
[facility] Admission Suicide Risk Assessment   Nursing information obtained from:    Demographic factors:    Current Mental Status:    Loss Factors:    Historical Factors:    Risk Reduction Factors:    Total Time spent with patient: 30 minutes Principal Problem: Paranoid schizophrenia, chronic condition with acute exacerbation Diagnosis:   Patient Active Problem List   Diagnosis Date Noted  . Paranoid schizophrenia, chronic condition with acute exacerbation [F20.0] 11/24/2014  . Fibroid uterus [D25.9] 05/12/2011  . Pelvic pain [R10.2] 05/12/2011     Continued Clinical Symptoms:  Alcohol Use Disorder Identification Test Final Score (AUDIT): 0 The "Alcohol Use Disorders Identification Test", Guidelines for Use in Primary Care, Second Edition.  World Pharmacologist [facility]). Score between 0-7:  no or low risk or alcohol related problems. Score between 8-15:  moderate risk of alcohol related problems. Score between 16-19:  high risk of alcohol related problems. Score 20 or above:  warrants further diagnostic evaluation for alcohol dependence and treatment.   CLINICAL FACTORS:   Schizophrenia:   Command hallucinatons Less than 5 years old Paranoid or undifferentiated type Previous Psychiatric Diagnoses and Treatments   Musculoskeletal: Strength & Muscle Tone: within normal limits Gait & Station: normal Patient leans: N/A  Psychiatric Specialty Exam: Physical Exam  Review of Systems  Psychiatric/Behavioral: Positive for hallucinations. The patient is nervous/anxious.   All other systems reviewed and are negative.   Blood pressure 106/59, pulse 74, temperature 98.2 F (36.8 C), temperature source Oral, resp. rate 18, height 5\' 1"  (1.549 m), weight 58.514 kg (129 lb), last menstrual period 04/12/2011, SpO2 100 %.Body mass index is 24.39 kg/(m^2).  General Appearance: Disheveled  Eye Contact::  Good  Speech:  Clear and Coherent  Volume:  Normal  Mood:  Anxious  Affect:   Labile  Thought Process:  Coherent  Orientation:  Full (Time, Place, and Person)  Thought Content:  Hallucinations: Auditory Command:  KILL HERSELF and Paranoid Ideation  Suicidal Thoughts:  No  Homicidal Thoughts:  No  Memory:  Immediate;   Fair Recent;   Fair Remote;   Fair  Judgement:  Impaired  Insight:  Fair  Psychomotor Activity:  Normal  Concentration:  Fair  Recall:  [employer] of Knowledge:Fair  Language: Fair  Akathisia:  No  Handed:  Right  AIMS (if indicated):     Assets:  Communication Skills Desire for Improvement  Sleep:  Number of Hours: 6  Cognition: WNL  ADL's:  Intact     COGNITIVE FEATURES THAT CONTRIBUTE TO RISK:  Closed-mindedness, Polarized thinking and Thought constriction (tunnel vision)    SUICIDE RISK:   Moderate:  Frequent suicidal ideation with limited intensity, and duration, some specificity in terms of plans, no associated intent, good self-control, limited dysphoria/symptomatology, some risk factors present, and identifiable protective factors, including available and accessible social support.  PLAN OF CARE: Patient will benefit from inpatient treatment and stabilization.  Estimated length of stay is 5-7 days.  Reviewed past medical records,treatment plan.  Patient to be continued on Prolixin , for psychosis, started this admission, dose titrated up based on response. Will also consider adding a mood stabilizer , since she has been voicing some mood lability, irritability. Will continue to monitor vitals ,medication compliance and treatment side effects while patient is here.  Will monitor for medical issues as well as call consult as needed.  Reviewed labs - cbc- wnl, bmp - wnl, ekg- qtc wnl, uds - pos for THC and BAL <  5.  ,will order metabolic panel, PL . CSW will start working on disposition.  Patient to participate in therapeutic milieu .       Medical Decision Making:  Review of Psycho-Social Stressors (1), Review and summation  of old records (2), Review of Last Therapy Session (1), Review of Medication Regimen & Side Effects (2) and Review of New Medication or Change in Dosage (2)  I certify that inpatient services furnished can reasonably be expected to improve the patient's condition.   Samwise Eckardt MD 11/25/2014, 11:22 AM

## 2014-11-26 LAB — COMPREHENSIVE METABOLIC PANEL
ALBUMIN: 3.8 g/dL (ref 3.5–5.0)
ALT: 11 U/L — AB (ref 14–54)
AST: 22 U/L (ref 15–41)
Alkaline Phosphatase: 51 U/L (ref 38–126)
Anion gap: 7 (ref 5–15)
BUN: 10 mg/dL (ref 6–20)
CHLORIDE: 106 mmol/L (ref 101–111)
CO2: 25 mmol/L (ref 22–32)
CREATININE: 0.88 mg/dL (ref 0.44–1.00)
Calcium: 9 mg/dL (ref 8.9–10.3)
GFR calc non Af Amer: >60 mL/min (ref >60)
Glucose, Bld: 125 mg/dL — ABNORMAL HIGH (ref 65–99)
Potassium: 3.9 mmol/L (ref 3.5–5.1)
SODIUM: 138 mmol/L (ref 135–145)
Total Bilirubin: 0.3 mg/dL (ref 0.3–1.2)
Total Protein: 7.7 g/dL (ref 6.5–8.1)

## 2014-11-26 LAB — TSH: TSH: 2.067 u[IU]/mL (ref 0.350–4.500)

## 2014-11-26 LAB — LIPID PANEL
Cholesterol: 185 mg/dL (ref 0–200)
HDL: 39 mg/dL — ABNORMAL LOW (ref >40)
LDL Cholesterol: 113 mg/dL — ABNORMAL HIGH (ref 0–99)
Total CHOL/HDL Ratio: 4.7 RATIO
Triglycerides: 165 mg/dL — ABNORMAL HIGH (ref <150)
VLDL: 33 mg/dL (ref 0–40)

## 2014-11-26 MED ORDER — TRAZODONE HCL 50 MG PO TABS
50.0000 mg | ORAL_TABLET | Freq: Every evening | ORAL | Status: DC | PRN
  Administered 2014-11-26 – 2014-11-30 (×4): 50 mg via ORAL
  Filled 2014-11-26 (×4): qty 1

## 2014-11-26 MED ORDER — LIDOCAINE 5 % EX PTCH
1.0000 | MEDICATED_PATCH | Freq: Every day | CUTANEOUS | Status: DC
  Administered 2014-11-26 – 2014-11-27 (×2): 1 via TRANSDERMAL
  Filled 2014-11-26 (×8): qty 1

## 2014-11-26 NOTE — Progress Notes (Signed)
Pt stated that she had a good day, went to a couple of groups, and talked to her pastor. Her goal for tomorrow is to attend all groups.

## 2014-11-26 NOTE — Progress Notes (Signed)
[facility] MD Progress Note  11/26/2014 12:30 PM Valerie Lee  MRN:  [id number] Subjective: Patient states " I still have Lambert asking me to hurt myself and I could not sleep due to my pain last night.'  Objective: Valerie Lee is a 39 y.o.AA female who presented to [facility] voluntary with members of her church due to having auditory and visual hallucinations of someone telling her that "it's ok" to hurt herself. Patient states that she has been feeling this way for a "couple days" and has became increasingly anxious and saw "the man" today. The patient has a long history of psychosis and mood instability. Patient has been patient in the past at [facility] and was stable on a combination of Abilify, Latuda, and Vistaril but was unable to afford the medications.  Patient seen and chart reviewed.Discussed patient with treatment team. Pt today with continued AH , command type asking her to hurt self. She however reports that AH is less loud than it used to be on admission. Pt also with hx of hip dislocation as well as chronic pain issues , continues to struggle with them on the unit. Pt today is open to taking Lidocaine patch at this time along with her tylenol PRN. Pt also with inability to sleep last night 2/2 her pain issues. Will increase her sleep medications as well. Per staff, pt continues to be psychotic , has been compliant on medications, denies side effects . Pt will continue to need support and encouragement.       Principal Problem: Schizoaffective disorder, depressive type Diagnosis:   Patient Active Problem List   Diagnosis Date Noted  . Schizoaffective disorder, depressive type [F25.1] 11/24/2014  . Fibroid uterus [D25.9] 05/12/2011  . Pelvic pain [R10.2] 05/12/2011   Total Time spent with patient: 30 minutes   Past Medical History:  Past Medical History  Diagnosis Date  . G6PD (glucose 6 phosphatase deficiency)   . Anemia   . G6PD deficiency anemia   . Blood transfusion    . Asthma     rarely uses albuterol rescue  . Mental disorder   . Schizophrenia   . Schizophrenia   . Bipolar 1 disorder     Past Surgical History  Procedure Laterality Date  . Tubal ligation    . Cholecystectomy  2011  . Abdominal hysterectomy  05/26/2011    Procedure: HYSTERECTOMY ABDOMINAL;  Surgeon: Mora Bellman, MD;  Location: [facility];  Service: Gynecology;  Laterality: N/A;  . Tubal ligation     Family History:  Family History  Problem Relation Age of Onset  . Hypertension Mother    Social History:  History  Alcohol Use No     History  Drug Use  . Yes  . Special: Marijuana    Social History   Social History  . Marital Status: Married    Spouse Name: N/A  . Number of Children: N/A  . Years of Education: N/A   Social History Main Topics  . Smoking status: Current Every Day Smoker -- 1.50 packs/day for 24 years    Types: Cigarettes  . Smokeless tobacco: Never Used  . Alcohol Use: No  . Drug Use: Yes    Special: Marijuana  . Sexual Activity: Yes    Birth Control/ Protection: Surgical   Other Topics Concern  . None   Social History Narrative   Additional History:    Sleep: Poor  Appetite:  Poor   Musculoskeletal: Strength & Muscle Tone: within normal limits Gait &  Station: normal Patient leans: N/A   Psychiatric Specialty Exam: Physical Exam  Review of Systems  Musculoskeletal: Positive for joint pain.  Psychiatric/Behavioral: Positive for depression and hallucinations. The patient is nervous/anxious and has insomnia.   All other systems reviewed and are negative.   Blood pressure 97/56, pulse 83, temperature 98.2 F (36.8 C), temperature source Oral, resp. rate 20, height _0  (1.549 m), weight 58.514 kg (129 lb), last menstrual period 04/12/2011, SpO2 100 %.Body mass index is 24.39 kg/(m^2).  General Appearance: Fairly Groomed  Engineer, water::  Fair  Speech:  Slow  Volume:  Decreased  Mood:  Anxious and Depressed  Affect:  Depressed   Thought Process:  Linear  Orientation:  Full (Time, Place, and Person)  Thought Content:  Hallucinations: Auditory Command:  asks her to hurt self and Rumination  Suicidal Thoughts:  No  Homicidal Thoughts:  No  Memory:  Immediate;   Fair Recent;   Fair Remote;   Fair  Judgement:  Impaired  Insight:  Shallow  Psychomotor Activity:  Decreased  Concentration:  Fair  Recall:  [employer] of Knowledge:Fair  Language: Fair  Akathisia:  No  Handed:  Right  AIMS (if indicated):     Assets:  Communication Skills Desire for Improvement  ADL's:  Intact  Cognition: WNL  Sleep:  Number of Hours: 5     Current Medications: Current Facility-Administered Medications  Medication Dose Route Frequency Provider Last Rate Last Dose  . acetaminophen (TYLENOL) tablet 650 mg  650 mg Oral Q6H PRN Patrecia Pour, NP   650 mg at 11/25/14 2141  . albuterol (PROVENTIL HFA;VENTOLIN HFA) 108 (90 BASE) MCG/ACT inhaler 2 puff  2 puff Inhalation Q6H PRN Patrecia Pour, NP      . alum & mag hydroxide-simeth (MAALOX/MYLANTA) 200-200-20 MG/5ML suspension 30 mL  30 mL Oral Q4H PRN Patrecia Pour, NP      . amantadine (SYMMETREL) capsule 100 mg  100 mg Oral BID Patrecia Pour, NP   100 mg at 11/26/14 0816  . fluPHENAZine (PROLIXIN) tablet 5 mg  5 mg Oral BID PC Niel Hummer, NP   5 mg at 11/26/14 0816  . hydrOXYzine (ATARAX/VISTARIL) tablet 25 mg  25 mg Oral Q6H PRN Patrecia Pour, NP   25 mg at 11/25/14 2141  . lamoTRIgine (LAMICTAL) tablet 25 mg  25 mg Oral Daily Niel Hummer, NP   25 mg at 11/26/14 0816  . lidocaine (LIDODERM) 5 % 1 patch  1 patch Transdermal Q24H Nora Rooke, MD      . magnesium hydroxide (MILK OF MAGNESIA) suspension 30 mL  30 mL Oral Daily PRN Patrecia Pour, NP      . nicotine (NICODERM CQ - dosed in mg/24 hours) patch 21 mg  21 mg Transdermal Daily Patrecia Pour, NP   21 mg at 11/26/14 0818  . ondansetron (ZOFRAN) tablet 4 mg  4 mg Oral Q8H PRN Patrecia Pour, NP      . traZODone  (DESYREL) tablet 50 mg  50 mg Oral QHS PRN Ursula Alert, MD        Lab Results:  Results for orders placed or performed during the hospital encounter of 11/24/14 (from the past 48 hour(s))  Lipid panel     Status: Abnormal   Collection Time: 11/26/14  6:38 AM  Result Value Ref Range   Cholesterol 185 0 - 200 mg/dL   Triglycerides 165 (H) <150 mg/dL   HDL  39 (L) >40 mg/dL   Total CHOL/HDL Ratio 4.7 RATIO   VLDL 33 0 - 40 mg/dL   LDL Cholesterol 113 (H) 0 - 99 mg/dL    Comment:        Total Cholesterol/HDL:CHD Risk Coronary Heart Disease Risk Table                     Men   Women  1/2 Average Risk   3.4   3.3  Average Risk       5.0   4.4  2 X Average Risk   9.6   7.1  3 X Average Risk  23.4   11.0        Use the calculated Patient Ratio above and the CHD Risk Table to determine the patient's CHD Risk.        ATP III CLASSIFICATION (LDL):  <100     mg/dL   Optimal  100-129  mg/dL   Near or Above                    Optimal  130-159  mg/dL   Borderline  160-189  mg/dL   High  >190     mg/dL   Very High Performed at [facility]   TSH     Status: None   Collection Time: 11/26/14  6:38 AM  Result Value Ref Range   TSH 2.067 0.350 - 4.500 uIU/mL    Comment: Performed at [facility]  Comprehensive metabolic panel     Status: Abnormal   Collection Time: 11/26/14  6:38 AM  Result Value Ref Range   Sodium 138 135 - 145 mmol/L   Potassium 3.9 3.5 - 5.1 mmol/L   Chloride 106 101 - 111 mmol/L   CO2 25 22 - 32 mmol/L   Glucose, Bld 125 (H) 65 - 99 mg/dL   BUN 10 6 - 20 mg/dL   Creatinine, Ser 0.88 0.44 - 1.00 mg/dL   Calcium 9.0 8.9 - 10.3 mg/dL   Total Protein 7.7 6.5 - 8.1 g/dL   Albumin 3.8 3.5 - 5.0 g/dL   AST 22 15 - 41 U/L   ALT 11 (L) 14 - 54 U/L   Alkaline Phosphatase 51 38 - 126 U/L   Total Bilirubin 0.3 0.3 - 1.2 mg/dL   GFR calc non Af Amer >60 >60 mL/min   GFR calc Af Amer >60 >60 mL/min    Comment: (NOTE) The eGFR has been  calculated using the CKD EPI equation. This calculation has not been validated in all clinical situations. eGFR's persistently <60 mL/min signify possible Chronic Kidney Disease.    Anion gap 7 5 - 15    Comment: Performed at [facility]    Physical Findings: AIMS: Facial and Oral Movements Muscles of Facial Expression: None, normal Lips and Perioral Area: None, normal Jaw: None, normal Tongue: None, normal,Extremity Movements Upper (arms, wrists, hands, fingers): None, normal Lower (legs, knees, ankles, toes): None, normal, Trunk Movements Neck, shoulders, hips: None, normal, Overall Severity Severity of abnormal movements (highest score from questions above): None, normal Incapacitation due to abnormal movements: None, normal Patient's awareness of abnormal movements (rate only patient's report): No Awareness, Dental Status Current problems with teeth and/or dentures?: No Does patient usually wear dentures?: No  CIWA:    COWS:     Assessment:  Valerie Lee is a 39 y.o.AA female who has a hx of schizoaffective do , presented  to [facility] voluntary with members of her church due to having auditory and visual hallucinations . Pt continues to struggle with command AH asking her to kill self and has sleep issues. Will readjust medications.     Treatment Plan Summary: Daily contact with patient to assess and evaluate symptoms and progress in treatment and Medication management Patient to be continued on Prolixin for psychosis, started this admission, increase to 5 mg po bid , she will get a higher dose today. Will continue Lamictal 25 mg po daily for mood lability, initiated yesterday. She has been tolerating it well. Will add Trazodone 50 mg po qhs prn for sleep. Will provide pain management - lidocaine patch for pain/continue Tylenol 650 mg po qhs prn . Will continue to monitor vitals ,medication compliance and treatment side effects while patient is here.   Will monitor for medical issues as well as call consult as needed.  Reviewed labs- TSH - wnl , Lipid panel - dyslipidemia - will get Dietician consult.pending Hba1c, PL .Reviewed EKG- 11/24/14- QTC - WNL. CSW will start working on disposition.  Patient to participate in therapeutic milieu .    Medical Decision Making:  Review of Psycho-Social Stressors (1), Review or order clinical lab tests (1), Review of Last Therapy Session (1), Review of Medication Regimen & Side Effects (2) and Review of New Medication or Change in Dosage (2)     Valerie Frees MD 11/26/2014, 12:30 PM

## 2014-11-26 NOTE — Progress Notes (Signed)
D  Pt is depressed and anxious    She has a worried pained expression on her face but denies hallucinations and suicidal ideation   She is polite and pleasant on approach   She has been cooperative and compliant with treatment  She came asking for the prolixin that she didn't get after dinner  A   Verbal support given   Medications administered and effectiveness monitored   Q 15 min checks   Educate on medications  R   Pt verbalizes understanding and is presently safe

## 2014-11-26 NOTE — Progress Notes (Signed)
Patient ID: Valerie Lee, female   DOB: Feb 14, 1976, 39 y.o.   MRN: [id number]  D: Patient pleasant on approach this am. Reports that she feels tired this am. States she still hears some voices but it has been mostly mumbling. Currently denies any active SI or HI. Denies any issues at present. A: Staff will monitor on q 15 minute checks, follow treatment plan, and give meds as ordered. R: Cooperative on the unit.

## 2014-11-26 NOTE — BHH Group Notes (Signed)
[facility] Mental Health Association Group Therapy  11/26/2014 , 1:41 PM    Type of Therapy:  Mental Health Association Presentation  Participation Level:  Active  Participation Quality:  Attentive  Affect:  Blunted  Cognitive:  Oriented  Insight:  Limited  Engagement in Therapy:  Engaged  Modes of Intervention:  Discussion, Education and Socialization  Summary of Progress/Problems:  Shanon Brow from [place] came to present his recovery story and play the guitar.  Was present for half of group.  Self soothing by sucking her thumb during part of it.  Was engaged.  Left and did not return.  Roque Lias B 11/26/2014 , 1:41 PM

## 2014-11-26 NOTE — BHH Group Notes (Signed)
[facility] LCSW Aftercare Discharge Planning Group Note   11/26/2014 1:40 PM  Participation Quality:  Invited.  Chose to not attend    [place], Barbaraann Rondo B

## 2014-11-27 LAB — HEMOGLOBIN A1C
Hgb A1c MFr Bld: 5 % (ref 4.8–5.6)
MEAN PLASMA GLUCOSE: 97 mg/dL

## 2014-11-27 LAB — PROLACTIN: Prolactin: 68.7 ng/mL — ABNORMAL HIGH (ref 4.8–23.3)

## 2014-11-27 NOTE — BHH Group Notes (Signed)
[place] Group Notes:  (Counselor/Nursing/MHT/Case Management/Adjunct)  11/27/2014 1:15PM  Type of Therapy:  Group Therapy  Participation Level:  Active  Participation Quality:  Appropriate  Affect:  Flat  Cognitive:  Oriented  Insight:  Improving  Engagement in Group:  Limited  Engagement in Therapy:  Limited  Modes of Intervention:  Discussion, Exploration and Socialization  Summary of Progress/Problems: The topic for group was balance in life.  Pt participated in the discussion about when their life was in balance and out of balance and how this feels.  Pt discussed ways to get back in balance and short term goals they can work on to get where they want to be. Came a stayed the entire time.  Was buried under a blanket because she was cold, but contributed from underneath the blanket, which made things interesting.  Spoke of her neighborhood as a positive community, and gave examples of the kind of support they give each other.  Talked over a negative patient with positive slogans and suggestions.   Roque Lias B 11/27/2014 3:07 PM

## 2014-11-27 NOTE — Plan of Care (Signed)
Problem: Alteration in thought process Goal: LTG-Patient has not harmed self or others in at least 2 days Outcome: Progressing Pt is free from self harm behaviors Goal: LTG-Patient verbalizes understanding importance med regimen (Patient verbalizes understanding of importance of medication regimen and need to continue outpatient care.)  Outcome: Progressing Pt is compliant with medication regimen  Problem: Alteration in mood Goal: LTG-Pt's behavior demonstrates decreased signs of depression (Patient's behavior demonstrates decreased signs of depression to the point the patient is safe to return home and continue treatment in an outpatient setting)  Outcome: Progressing Pt is smiling more and has a more positive attitude and outlook

## 2014-11-27 NOTE — Progress Notes (Signed)
NSG shift assessment. 7a-7p.   D: Pt affect is flat and she appears to be depressed.  She did not attend morning group and stayed in bed. When it was time for the counselor-led group she complained of a head ache and did not want to go because the light hurt her eyes, but she went. Cooperative with staff and is getting along well with peers.   A: Observed pt interacting in group and in the milieu: Support and encouragement offered. Safety maintained with observations every 15 minutes.   R:  Contracts for safety and continues to follow the treatment plan, working on learning new coping skills.

## 2014-11-27 NOTE — Progress Notes (Signed)
Adult Psychoeducational Group Note  Date:  11/27/2014 Time:  12:25 PM  Group Topic/Focus:  Building Self Esteem:   The Focus of this group is helping patients become aware of the effects of self-esteem on their lives, the things they and others do that enhance or undermine their self-esteem, seeing the relationship between their level of self-esteem and the choices they make and learning ways to enhance self-esteem.  Participation Level:  Did Not Attend  Participation Quality:  na  Affect:  n/a  Cognitive:  n/a  Insight: None  Engagement in Group:  n/a  Modes of Intervention:  n/a  Additional Comments:  n/a  Viann Fish 11/27/2014, 12:25 PM

## 2014-11-27 NOTE — Progress Notes (Signed)
[facility] Group Notes:  (Nursing/MHT/Case Management/Adjunct)  Date:  11/27/2014  Time:  9:06 PM  Type of Therapy:  Psychoeducational Skills  Participation Level:  Minimal  Participation Quality:  Attentive  Affect:  Anxious  Cognitive:  Appropriate  Insight:  Appropriate  Engagement in Group:  Limited  Modes of Intervention:  Education  Summary of Progress/Problems: The patient had to leave the group since she was complaining of swelling in her neck and throat. She also complained of having had a thick tongue which she had been experiencing for the past hour. Prior to leaving the group, the patient indicated that she had a good talk with her boyfriend and attended her groups as well.   Zita Ozimek S 11/27/2014, 9:06 PM

## 2014-11-27 NOTE — Progress Notes (Signed)
[facility] MD Progress Note  11/27/2014 12:27 PM Brehanna L Leazer  MRN:  [id number] Subjective: Patient states " I am better.   Objective: Rochelle L Palencia is a 39 y.o.AA female who presented to [facility] voluntary with members of her church due to having auditory and visual hallucinations of someone telling her that "it's ok" to hurt herself. Patient states that she has been feeling this way for a "couple days" and has became increasingly anxious and saw "the man" today. The patient has a long history of psychosis and mood instability. Patient has been patient in the past at [facility] and was stable on a combination of Abilify, Latuda, and Vistaril but was unable to afford the medications.  Patient seen and chart reviewed.Discussed patient with treatment team. Pt today with improvement of her AH , command type asking her to hurt self. She reports sleep as improving , appetite as fair. She continues to be mostly isolative , stays to self - needs encouragement and support. Pt also with hx of hip dislocation as well as chronic pain issues , continues to struggle with them on the unit. Pt is on pain management. Per staff, pt continues to be psychotic , has been compliant on medications, denies side effects .       Principal Problem: Schizoaffective disorder, depressive type Diagnosis:   Patient Active Problem List   Diagnosis Date Noted  . Schizoaffective disorder, depressive type [F25.1] 11/24/2014  . Fibroid uterus [D25.9] 05/12/2011  . Pelvic pain [R10.2] 05/12/2011   Total Time spent with patient: 30 minutes   Past Medical History:  Past Medical History  Diagnosis Date  . G6PD (glucose 6 phosphatase deficiency)   . Anemia   . G6PD deficiency anemia   . Blood transfusion   . Asthma     rarely uses albuterol rescue  . Mental disorder   . Schizophrenia   . Schizophrenia   . Bipolar 1 disorder     Past Surgical History  Procedure Laterality Date  . Tubal ligation    .  Cholecystectomy  2011  . Abdominal hysterectomy  05/26/2011    Procedure: HYSTERECTOMY ABDOMINAL;  Surgeon: Peggy Constant, MD;  Location: [facility];  Service: Gynecology;  Laterality: N/A;  . Tubal ligation     Family History:  Family History  Problem Relation Age of Onset  . Hypertension Mother    Social History:  History  Alcohol Use No     History  Drug Use  . Yes  . Special: Marijuana    Social History   Social History  . Marital Status: Married    Spouse Name: N/A  . Number of Children: N/A  . Years of Education: N/A   Social History Main Topics  . Smoking status: Current Every Day Smoker -- 1.50 packs/day for 24 years    Types: Cigarettes  . Smokeless tobacco: Never Used  . Alcohol Use: No  . Drug Use: Yes    Special: Marijuana  . Sexual Activity: Yes    Birth Control/ Protection: Surgical   Other Topics Concern  . None   Social History Narrative   Additional History:    Sleep: Fair  Appetite:  Fair   Musculoskeletal: Strength & Muscle Tone: within normal limits Gait & Station: normal Patient leans: N/A   Psychiatric Specialty Exam: Physical Exam  Review of Systems  Musculoskeletal: Positive for joint pain.  Psychiatric/Behavioral: Positive for depression and hallucinations. The patient is nervous/anxious and has insomnia.   All other systems reviewed   and are negative.   Blood pressure 97/62, pulse 79, temperature 97.7 F (36.5 C), temperature source Oral, resp. rate 16, height 5' 1" (1.549 m), weight 58.514 kg (129 lb), last menstrual period 04/12/2011, SpO2 100 %.Body mass index is 24.39 kg/(m^2).  General Appearance: Fairly Groomed  Eye Contact::  Fair  Speech:  Slow  Volume:  Decreased  Mood:  Anxious and Depressed  Affect:  Depressed  Thought Process:  Linear  Orientation:  Full (Time, Place, and Person)  Thought Content:  Hallucinations: Auditory Command:  asks her to hurt self and Rumination improving - is lower than on admission   Suicidal Thoughts:  No  Homicidal Thoughts:  No  Memory:  Immediate;   Fair Recent;   Fair Remote;   Fair  Judgement:  Impaired  Insight:  Shallow  Psychomotor Activity:  Decreased  Concentration:  Fair  Recall:  Fair  Fund of Knowledge:Fair  Language: Fair  Akathisia:  No  Handed:  Right  AIMS (if indicated):     Assets:  Communication Skills Desire for Improvement  ADL's:  Intact  Cognition: WNL  Sleep:  Number of Hours: 6     Current Medications: Current Facility-Administered Medications  Medication Dose Route Frequency Provider Last Rate Last Dose  . acetaminophen (TYLENOL) tablet 650 mg  650 mg Oral Q6H PRN Jamison Y Lord, NP   650 mg at 11/25/14 2141  . albuterol (PROVENTIL HFA;VENTOLIN HFA) 108 (90 BASE) MCG/ACT inhaler 2 puff  2 puff Inhalation Q6H PRN Jamison Y Lord, NP      . alum & mag hydroxide-simeth (MAALOX/MYLANTA) 200-200-20 MG/5ML suspension 30 mL  30 mL Oral Q4H PRN Jamison Y Lord, NP      . amantadine (SYMMETREL) capsule 100 mg  100 mg Oral BID Jamison Y Lord, NP   100 mg at 11/27/14 0820  . fluPHENAZine (PROLIXIN) tablet 5 mg  5 mg Oral BID PC Laura A Davis, NP   5 mg at 11/27/14 0820  . hydrOXYzine (ATARAX/VISTARIL) tablet 25 mg  25 mg Oral Q6H PRN Jamison Y Lord, NP   25 mg at 11/26/14 2108  . lamoTRIgine (LAMICTAL) tablet 25 mg  25 mg Oral Daily Laura A Davis, NP   25 mg at 11/27/14 0820  . lidocaine (LIDODERM) 5 % 1 patch  1 patch Transdermal Daily  , MD   1 patch at 11/27/14 0821  . magnesium hydroxide (MILK OF MAGNESIA) suspension 30 mL  30 mL Oral Daily PRN Jamison Y Lord, NP      . nicotine (NICODERM CQ - dosed in mg/24 hours) patch 21 mg  21 mg Transdermal Daily Jamison Y Lord, NP   21 mg at 11/26/14 0818  . ondansetron (ZOFRAN) tablet 4 mg  4 mg Oral Q8H PRN Jamison Y Lord, NP      . traZODone (DESYREL) tablet 50 mg  50 mg Oral QHS PRN  , MD   50 mg at 11/26/14 2109    Lab Results:  Results for orders placed or  performed during the hospital encounter of 11/24/14 (from the past 48 hour(s))  Hemoglobin A1c     Status: None   Collection Time: 11/26/14  6:38 AM  Result Value Ref Range   Hgb A1c MFr Bld 5.0 4.8 - 5.6 %    Comment: (NOTE)         Pre-diabetes: 5.7 - 6.4         Diabetes: >6.4         Glycemic   control for adults with diabetes: <7.0    Mean Plasma Glucose 97 mg/dL    Comment: (NOTE) Performed At: [facility] [place], [place] [id number] Lindon Romp MD [id number] Performed at [facility]   Lipid panel     Status: Abnormal   Collection Time: 11/26/14  6:38 AM  Result Value Ref Range   Cholesterol 185 0 - 200 mg/dL   Triglycerides 165 (H) <150 mg/dL   HDL 39 (L) >40 mg/dL   Total CHOL/HDL Ratio 4.7 RATIO   VLDL 33 0 - 40 mg/dL   LDL Cholesterol 113 (H) 0 - 99 mg/dL    Comment:        Total Cholesterol/HDL:CHD Risk Coronary Heart Disease Risk Table                     Men   Women  1/2 Average Risk   3.4   3.3  Average Risk       5.0   4.4  2 X Average Risk   9.6   7.1  3 X Average Risk  23.4   11.0        Use the calculated Patient Ratio above and the CHD Risk Table to determine the patient's CHD Risk.        ATP III CLASSIFICATION (LDL):  <100     mg/dL   Optimal  100-129  mg/dL   Near or Above                    Optimal  130-159  mg/dL   Borderline  160-189  mg/dL   High  >190     mg/dL   Very High Performed at [facility]   TSH     Status: None   Collection Time: 11/26/14  6:38 AM  Result Value Ref Range   TSH 2.067 0.350 - 4.500 uIU/mL    Comment: Performed at [facility]  Prolactin     Status: Abnormal   Collection Time: 11/26/14  6:38 AM  Result Value Ref Range   Prolactin 68.7 (H) 4.8 - 23.3 ng/mL    Comment: (NOTE) Performed At: [facility] [place], [place] [id number] Lindon Romp MD [id number] Performed at [facility]    Comprehensive metabolic panel     Status: Abnormal   Collection Time: 11/26/14  6:38 AM  Result Value Ref Range   Sodium 138 135 - 145 mmol/L   Potassium 3.9 3.5 - 5.1 mmol/L   Chloride 106 101 - 111 mmol/L   CO2 25 22 - 32 mmol/L   Glucose, Bld 125 (H) 65 - 99 mg/dL   BUN 10 6 - 20 mg/dL   Creatinine, Ser 0.88 0.44 - 1.00 mg/dL   Calcium 9.0 8.9 - 10.3 mg/dL   Total Protein 7.7 6.5 - 8.1 g/dL   Albumin 3.8 3.5 - 5.0 g/dL   AST 22 15 - 41 U/L   ALT 11 (L) 14 - 54 U/L   Alkaline Phosphatase 51 38 - 126 U/L   Total Bilirubin 0.3 0.3 - 1.2 mg/dL   GFR calc non Af Amer >60 >60 mL/min   GFR calc Af Amer >60 >60 mL/min    Comment: (NOTE) The eGFR has been calculated using the CKD EPI equation. This calculation has not been validated in all clinical situations. eGFR's persistently <60 mL/min signify possible Chronic Kidney Disease.    Anion  gap 7 5 - 15    Comment: Performed at [facility]    Physical Findings: AIMS: Facial and Oral Movements Muscles of Facial Expression: None, normal Lips and Perioral Area: None, normal Jaw: None, normal Tongue: None, normal,Extremity Movements Upper (arms, wrists, hands, fingers): None, normal Lower (legs, knees, ankles, toes): None, normal, Trunk Movements Neck, shoulders, hips: None, normal, Overall Severity Severity of abnormal movements (highest score from questions above): None, normal Incapacitation due to abnormal movements: None, normal Patient's awareness of abnormal movements (rate only patient's report): No Awareness, Dental Status Current problems with teeth and/or dentures?: No Does patient usually wear dentures?: No  CIWA:    COWS:     Assessment:  Rayan L Robards is a 38 y.o.AA female who has a hx of schizoaffective do , presented to [facility] voluntary with members of her church due to having auditory and visual hallucinations . Pt continues to struggle with command AH asking, but improved since admission  . Will readjust medications.     Treatment Plan Summary: Daily contact with patient to assess and evaluate symptoms and progress in treatment and Medication management Patient to be continued on Prolixin for psychosis , continue  5 mg po bid .Pt to be discharged on Prolixin decanoate IM on DC. Will continue Lamictal 25 mg po daily for mood lability, initiated yesterday. She has been tolerating it well. Will continue Trazodone 50 mg po qhs prn for sleep. Will provide pain management - lidocaine patch for pain/continue Tylenol 650 mg po qhs prn . Will continue to monitor vitals ,medication compliance and treatment side effects while patient is here.  Will monitor for medical issues as well as call consult as needed.  CSW will start working on disposition.  Patient to participate in therapeutic milieu .    Medical Decision Making:  Review of Psycho-Social Stressors (1), Review or order clinical lab tests (1), Review of Last Therapy Session (1), Review of Medication Regimen & Side Effects (2) and Review of New Medication or Change in Dosage (2)     , MD 11/27/2014, 12:27 PM  

## 2014-11-27 NOTE — Progress Notes (Signed)
D  Pt is depressed and anxious    She has a worried pained expression on her face but denies hallucinations and suicidal ideation   She is polite and pleasant on approach   She has been cooperative and compliant with treatment  She complained of her tongue feeling thick and having trouble forming her words  A   Verbal support given   Medications administered and effectiveness monitored   Q 15 min checks   Educate on medications   Administered prn medication for complaints regarding her tongue and speech R   Pt verbalizes understanding and is presently safe   Pt fell asleep with no further complaints

## 2014-11-27 NOTE — Progress Notes (Signed)
Nutrition Education Note  RD consulted for nutrition education regarding hyperlipidemia.  Lipid Panel     Component Value Date/Time   CHOL 185 11/26/2014 0638   CHOL 165 12/26/2013 0819   TRIG 165* 11/26/2014 0638   TRIG 135 12/26/2013 0819   HDL 39* 11/26/2014 0638   HDL 51 12/26/2013 0819   CHOLHDL 4.7 11/26/2014 0638   VLDL 33 11/26/2014 0638   VLDL 27 12/26/2013 0819   LDLCALC 113* 11/26/2014 0638   LDLCALC 87 12/26/2013 0819    RD provided "High Triglycerides Nutrition Therapy" handout from the Academy of [employer]etics. Reviewed patient's dietary recall. Provided examples on ways to decrease sugar and fat intake in diet. Discouraged intake of processed foods and consumption of sugar-sweetened beverages. Encouraged fresh fruits and vegetables as well as whole grain sources of carbohydrates to maximize fiber intake. Teach back method used.  Pt states that she has been limiting fried foods in her diet due to medical hx.  Expect good compliance.  Body mass index is 24.39 kg/(m^2). Pt meets criteria for normal weight based on current BMI.  Diet Order: Diet Heart Room service appropriate?: Yes; Fluid consistency:: Thin Pt is also offered choice of unit snacks mid-morning and mid-afternoon.  Pt is eating as desired.   Labs and medications reviewed. No further nutrition interventions warranted at this time. If additional nutrition issues arise, please re-consult RD.    Jarome Matin, RD, LDN Inpatient Clinical Dietitian Pager # [phone] After hours/weekend pager # [phone]

## 2014-11-28 DIAGNOSIS — E221 Hyperprolactinemia: Secondary | ICD-10-CM | POA: Clinically undetermined

## 2014-11-28 MED ORDER — NICOTINE POLACRILEX 2 MG MT GUM
2.0000 mg | CHEWING_GUM | OROMUCOSAL | Status: DC | PRN
  Administered 2014-11-28: 2 mg via ORAL

## 2014-11-28 MED ORDER — FLUPHENAZINE HCL 2.5 MG PO TABS
2.5000 mg | ORAL_TABLET | Freq: Every day | ORAL | Status: AC
  Filled 2014-11-28: qty 1

## 2014-11-28 MED ORDER — BENZTROPINE MESYLATE 1 MG PO TABS
1.0000 mg | ORAL_TABLET | Freq: Two times a day (BID) | ORAL | Status: DC
Start: 2014-11-28 — End: 2014-12-01
  Administered 2014-11-28: 1 mg via ORAL
  Filled 2014-11-28 (×10): qty 1

## 2014-11-28 MED ORDER — ARIPIPRAZOLE 5 MG PO TABS
5.0000 mg | ORAL_TABLET | Freq: Every day | ORAL | Status: DC
  Administered 2014-11-28: 5 mg via ORAL
  Filled 2014-11-28 (×4): qty 1

## 2014-11-28 MED ORDER — FLUPHENAZINE HCL 2.5 MG PO TABS
2.5000 mg | ORAL_TABLET | Freq: Every day | ORAL | Status: DC
  Filled 2014-11-28 (×2): qty 1

## 2014-11-28 MED ORDER — BENZTROPINE MESYLATE 1 MG PO TABS: 1.0000 mg | ORAL_TABLET | Freq: Two times a day (BID) | ORAL | Status: DC

## 2014-11-28 MED ORDER — FLUPHENAZINE HCL 5 MG PO TABS
5.0000 mg | ORAL_TABLET | Freq: Every day | ORAL | Status: DC
  Filled 2014-11-28 (×2): qty 1

## 2014-11-28 MED ORDER — NICOTINE POLACRILEX 2 MG MT GUM
CHEWING_GUM | OROMUCOSAL | Status: AC
Start: 2014-11-28 — End: 2014-11-28
  Administered 2014-11-28: 2 mg via ORAL
  Filled 2014-11-28: qty 1

## 2014-11-28 MED ORDER — DIPHENHYDRAMINE HCL 25 MG PO CAPS
25.0000 mg | ORAL_CAPSULE | Freq: Once | ORAL | Status: AC
  Administered 2014-11-28: 25 mg via ORAL
  Filled 2014-11-28 (×2): qty 1

## 2014-11-28 MED ORDER — ARIPIPRAZOLE ER 400 MG IM SUSR
400.0000 mg | INTRAMUSCULAR | Status: DC
  Filled 2014-11-28: qty 400

## 2014-11-28 NOTE — Progress Notes (Addendum)
[facility] MD Progress Note  11/28/2014 12:17 PM Valerie Lee  MRN:  [id number] Subjective: Patient states " I am better.   Objective: Valerie Lee is a 39 y.o.AA female who presented to [facility] voluntary with members of her church due to having auditory and visual hallucinations of someone telling her that "it's ok" to hurt herself. Patient states that she has been feeling this way for a "couple days" and has became increasingly anxious and saw "the man" today. The patient has a long history of psychosis and mood instability. Patient has been patient in the past at [facility] and was stable on a combination of Abilify, Latuda, and Vistaril but was unable to afford the medications.  Patient seen and chart reviewed.Discussed patient with treatment team. Pt today with improvement of her AH , command type asking her to hurt self. Patient reports side effects to prolixin - her tongue got swollen last night after the PM dose of prolixin .  Will reduce the prolixin dose today for EPS , add cogentin for side effects , she was on amantadine since admission. AIMS - 0 today , will monitor on the unit . Per staff, pt with EPS a noted above - will reduce the dose and monitor patient.        Principal Problem: Schizoaffective disorder, depressive type Diagnosis:   Patient Active Problem List   Diagnosis Date Noted  . 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 Schizoaffective disorder, depressive type [F25.1] 11/24/2014  . Fibroid uterus [D25.9] 05/12/2011  . Pelvic pain [R10.2] 05/12/2011   Total Time spent with patient: 30 minutes   Past Medical History:  Past Medical History  Diagnosis Date  . G6PD (glucose 6 phosphatase deficiency)   . Anemia   . G6PD deficiency anemia   . Blood transfusion   . Asthma     rarely uses albuterol rescue  . Mental disorder   . Schizophrenia   . Schizophrenia   . Bipolar 1 disorder     Past Surgical History  Procedure Laterality Date  . Tubal ligation     . Cholecystectomy  2011  . Abdominal hysterectomy  05/26/2011    Procedure: HYSTERECTOMY ABDOMINAL;  Surgeon: Mora Bellman, MD;  Location: [facility];  Service: Gynecology;  Laterality: N/A;  . Tubal ligation     Family History:  Family History  Problem Relation Age of Onset  . Hypertension Mother    Social History:  History  Alcohol Use No     History  Drug Use  . Yes  . Special: Marijuana    Social History   Social History  . Marital Status: Married    Spouse Name: N/A  . Number of Children: N/A  . Years of Education: N/A   Social History Main Topics  . Smoking status: Current Every Day Smoker -- 1.50 packs/day for 24 years    Types: Cigarettes  . Smokeless tobacco: Never Used  . Alcohol Use: No  . Drug Use: Yes    Special: Marijuana  . Sexual Activity: Yes    Birth Control/ Protection: Surgical   Other Topics Concern  . None   Social History Narrative   Additional History:    Sleep: Fair  Appetite:  Fair   Musculoskeletal: Strength & Muscle Tone: within normal limits Gait & Station: normal Patient leans: N/A   Psychiatric Specialty Exam: Physical Exam  Review of Systems  Musculoskeletal: Positive for joint pain.  Psychiatric/Behavioral: Positive for depression and hallucinations. The patient is nervous/anxious. The patient  does not have insomnia.   All other systems reviewed and are negative.   Blood pressure 92/62, pulse 84, temperature 97.8 F (36.6 C), temperature source Oral, resp. rate 18, height 5\' 1"  (1.549 m), weight 58.514 kg (129 lb), last menstrual period 04/12/2011, SpO2 100 %.Body mass index is 24.39 kg/(m^2).  General Appearance: Fairly Groomed  Engineer, water::  Fair  Speech:  Slow  Volume:  Decreased  Mood:  Anxious and Depressed  Affect:  Depressed  Thought Process:  Linear  Orientation:  Full (Time, Place, and Person)  Thought Content:  Hallucinations: Auditory Command:  asks her to hurt self and Rumination improving - is  lower than on admission  Suicidal Thoughts:  No  Homicidal Thoughts:  No  Memory:  Immediate;   Fair Recent;   Fair Remote;   Fair  Judgement:  Impaired  Insight:  Shallow  Psychomotor Activity:  Decreased  Concentration:  Fair  Recall:  [employer] of Knowledge:Fair  Language: Fair  Akathisia:  No  Handed:  Right  AIMS (if indicated):     Assets:  Communication Skills Desire for Improvement  ADL's:  Intact  Cognition: WNL  Sleep:  Number of Hours: 6.5     Current Medications: Current Facility-Administered Medications  Medication Dose Route Frequency Provider Last Rate Last Dose  . acetaminophen (TYLENOL) tablet 650 mg  650 mg Oral Q6H PRN Patrecia Pour, NP   650 mg at 11/28/14 1002  . albuterol (PROVENTIL HFA;VENTOLIN HFA) 108 (90 BASE) MCG/ACT inhaler 2 puff  2 puff Inhalation Q6H PRN Patrecia Pour, NP      . alum & mag hydroxide-simeth (MAALOX/MYLANTA) 200-200-20 MG/5ML suspension 30 mL  30 mL Oral Q4H PRN Patrecia Pour, NP      . benztropine (COGENTIN) tablet 1 mg  1 mg Oral BID PC Valerie Digiacomo, MD      . fluPHENAZine (PROLIXIN) tablet 2.5 mg  2.5 mg Oral QPC supper Ursula Alert, MD      . Derrill Memo ON 11/29/2014] fluPHENAZine (PROLIXIN) tablet 5 mg  5 mg Oral Daily Truman Aceituno, MD      . hydrOXYzine (ATARAX/VISTARIL) tablet 25 mg  25 mg Oral Q6H PRN Patrecia Pour, NP   25 mg at 11/27/14 2023  . lamoTRIgine (LAMICTAL) tablet 25 mg  25 mg Oral Daily Niel Hummer, NP   25 mg at 11/28/14 0803  . lidocaine (LIDODERM) 5 % 1 patch  1 patch Transdermal Daily Ursula Alert, MD   1 patch at 11/27/14 0821  . magnesium hydroxide (MILK OF MAGNESIA) suspension 30 mL  30 mL Oral Daily PRN Patrecia Pour, NP      . nicotine polacrilex (NICORETTE) gum 2 mg  2 mg Oral PRN Ursula Alert, MD   2 mg at 11/28/14 1008  . ondansetron (ZOFRAN) tablet 4 mg  4 mg Oral Q8H PRN Patrecia Pour, NP      . traZODone (DESYREL) tablet 50 mg  50 mg Oral QHS PRN Ursula Alert, MD   50 mg at 11/26/14  2109    Lab Results:  No results found for this or any previous visit (from the past 48 hour(s)).  Physical Findings: AIMS: Facial and Oral Movements Muscles of Facial Expression: None, normal Lips and Perioral Area: None, normal Jaw: None, normal Tongue: None, normal,Extremity Movements Upper (arms, wrists, hands, fingers): None, normal Lower (legs, knees, ankles, toes): None, normal, Trunk Movements Neck, shoulders, hips: None, normal, Overall Severity Severity of abnormal  movements (highest score from questions above): None, normal Incapacitation due to abnormal movements: None, normal Patient's awareness of abnormal movements (rate only patient's report): No Awareness, Dental Status Current problems with teeth and/or dentures?: No Does patient usually wear dentures?: No  CIWA:    COWS:     Assessment:  RHODIE CIENFUEGOS is a 39 y.o.AA female who has a hx of schizoaffective do , presented to [facility] voluntary with members of her church due to having auditory and visual hallucinations . Pt with C/O EPS to prolixin , will reduce dose today. Will readjust medications.     Treatment Plan Summary: Daily contact with patient to assess and evaluate symptoms and progress in treatment and Medication management   Will reduce the dose of Prolixin to 5 mg qam and 2.5 mg po qpm for psychosis - the dose reduced 2/2 EPS . Patient reports good response to Abilify in the past . Will add Abilify today at 5 mg po qhs . Plan to taper of Prolixin over the week end and titrate Abilify up depending on response.Patient also with elevated PL level - will start Abilify for the same. Plan to DC patient on Abilify maintena IM.   Dc Amantadine . Start Cogentin 1mg  po bid for EPS. Will continue Lamictal 25 mg po daily for mood lability, initiated yesterday. She has been tolerating it well. Will continue Trazodone 50 mg po qhs prn for sleep. Will provide pain management - lidocaine patch for pain/continue  Tylenol 650 mg po qhs prn . Will continue to monitor vitals ,medication compliance and treatment side effects while patient is here.  Will monitor for medical issues as well as call consult as needed.  CSW will start working on disposition.  Patient to participate in therapeutic milieu .    Medical Decision Making:  Review of Psycho-Social Stressors (1), Review or order clinical lab tests (1), Order AIMS Test (2), Review of Last Therapy Session (1), Review of Medication Regimen & Side Effects (2) and Review of New Medication or Change in Dosage (2)     Dwayne Bulkley MD 11/28/2014, 12:17 PM

## 2014-11-28 NOTE — [facility] Group Notes (Signed)
[place] LCSW Group Therapy   11/28/2014 2:46 PM  Type of Therapy: Group Therapy  Participation Level:  Active  Participation Quality:  Attentive  Affect:  Flat  Cognitive:  Oriented  Insight:  Limited  Engagement in Therapy:  Engaged  Modes of Intervention:  Discussion and Socialization  Summary of Progress/Problems: Chaplain was here to lead a group on themes of hope and/or courage.  Pt attended group with a blanket wrapped over her head but was attentive. She spoke about caring for her children, and explained that necessities include both monetary things like shelter and food, and love. When asked what kind of care she received as a child the pt explained that her grandmother and mother "did the best they could". Pt explained that she enjoys reading for self-care.  Georga Kaufmann 11/28/2014 2:46 PM

## 2014-11-28 NOTE — Tx Team (Signed)
Interdisciplinary Treatment Plan Update (Adult)  Date:  11/28/2014   Time Reviewed:  8:23 AM   Progress in Treatment: Attending groups: Yes. Participating in groups:  Yes. Taking medication as prescribed:  Yes. Tolerating medication:  Yes. Family/Significant othe contact made:  Yes Patient understands diagnosis:  Yes  As evidenced by seeking help with command hallucinations Discussing patient identified problems/goals with staff:  Yes, see initial care plan. Medical problems stabilized or resolved:  Yes. Denies suicidal/homicidal ideation: Yes. Issues/concerns per patient self-inventory:  No. Other:  New problem(s) identified:  Discharge Plan or Barriers: return home, follow up outpt  Reason for Continuation of Hospitalization: Depression Hallucinations Medication stabilization  Comments:  Subjective:  " I still have AH asking me to hurt myself and I could not sleep due to my pain last night.'  Valerie Lee is a 39 y.o.AA female who presented to [facility] voluntary with members of her church due to having auditory and visual hallucinations of someone telling her that "it's ok" to hurt herself. Patient states that she has been feeling this way for a "couple days" and has became increasingly anxious and saw "the man" today. The patient has a long history of psychosis and mood instability. Patient has been patient in the past at [facility] and was stable on a combination of Abilify, Latuda, and Vistaril but was unable to afford the medications.  Prolixin, Lamictal trial 11/28/14 Patient seen and chart reviewed.Discussed patient with treatment team. Pt today with improvement of her AH , command type asking her to hurt self. Patient reports side effects to prolixin - her tongue got swollen last night after the PM dose of prolixin .  Will reduce the prolixin dose today for EPS , add cogentin for side effects , she was on amantadine since admission.  Estimated length of stay: Likely d/c  Monday  New goal(s):  Review of initial/current patient goals per problem list:   Review of initial/current patient goals per problem list:  1. Goal(s): Patient will participate in aftercare plan   Met: Yes   Target date: 3-5 days post admission date   As evidenced by: Patient will participate within aftercare plan AEB aftercare provider and housing plan at discharge being identified.  11/28/2014: Pt plans to return home, follow up outpt.    2. Goal (s): Patient will exhibit decreased depressive symptoms and suicidal ideations.   Met: No   Target date: 3-5 days post admission date   As evidenced by: Patient will utilize self rating of depression at 3 or below and demonstrate decreased signs of depression or be deemed stable for discharge by MD. 11/25/14  Valerie Lee rates her depression at a 6 today. 11/28/2014  Valerie Lee rates her depression at a 5 today    5. Goal(s): Patient will demonstrate decreased signs of psychosis  * Met: Yes  * Target date: 3-5 days post admission date  * As evidenced by: Patient will demonstrate decreased frequency of AVH or return to baseline function 11/26/14  Valerie Lee states the command hallucinations have disappeared, but is still c/o AH   Willing to take meds 11/28/2014  No signs nor symptoms of psychosis today      Attendees: Patient:  11/28/2014 8:23 AM   Family:   11/28/2014 8:23 AM   Physician:  Ursula Alert, MD 11/28/2014 8:23 AM   Nursing:   Marcella Dubs, RN 11/28/2014 8:23 AM   CSW:    Roque Lias, LCSW   11/28/2014 8:23 AM   Other:  11/28/2014 8:23  AM   Other:   11/28/2014 8:23 AM   Other:  Lars Pinks, Nurse CM 11/28/2014 8:23 AM   Other:  Lucinda Dell, [facility] 11/28/2014 8:23 AM   Other:  Norberto Sorenson, [place]  11/28/2014 8:23 AM   Other:  11/28/2014 8:23 AM   Other:  11/28/2014 8:23 AM   Other:  11/28/2014 8:23 AM   Other:  11/28/2014 8:23 AM   Other:  11/28/2014 8:23 AM   Other:   11/28/2014 8:23 AM    Scribe for Treatment Team:   Trish Mage, 11/28/2014 8:23 AM

## 2014-11-28 NOTE — Progress Notes (Signed)
Patient complained of EPS of swollen tongue blocking her airway. Patient agitated and crying stated "I can't breath or sleep", also complained of tongue/throat pain. Dell Ponto, NP was notified who ordered one time order of Benadryl 25 mg. Medication offered to patient with good effect. Support and encouragement offered as needed. Every 15 minutes check for safety maintained. Will continue to monitor patient for safety and stability.

## 2014-11-28 NOTE — Progress Notes (Signed)
[facility] Group Notes:  (Nursing/MHT/Case Management/Adjunct)  Date:  11/28/2014  Time:  8:43 PM  Type of Therapy:  Psychoeducational Skills  Participation Level:  Minimal  Participation Quality:  Attentive  Affect:  Flat  Cognitive:  Lacking  Insight:  Lacking  Engagement in Group:  Resistant  Modes of Intervention:  Education  Summary of Progress/Problems: The patient attended group, however, she handed a note to this author rather than speaking in front of her peers. The patient states that she had a good day until she was given medication that has made it difficult for her to speak. As for the theme of the day, her relapse prevention will include taking her medication and taking care of herself rather than everyone else.   Archie Balboa S 11/28/2014, 8:43 PM

## 2014-11-28 NOTE — [facility] Suicide Risk Assessment (Signed)
[place] INPATIENT:  Family/Significant Other Suicide Prevention Education  Suicide Prevention Education:  Education Completed; Valerie Lee (daughter) [phone] has been identified by the patient as the family member/significant other with whom the patient will be residing, and identified as the person(s) who will aid the patient in the event of a mental health crisis (suicidal ideations/suicide attempt).  With written consent from the patient, the family member/significant other has been provided the following suicide prevention education, prior to the and/or following the discharge of the patient.  The suicide prevention education provided includes the following:  Suicide risk factors  Suicide prevention and interventions  National Suicide Hotline telephone number  [facility] assessment telephone number  [facility] Emergency Assistance [place] and/or Residential Mobile Crisis Unit telephone number  Request made of family/significant other to:  Remove weapons (e.g., guns, rifles, knives), all items previously/currently identified as safety concern.    Remove drugs/medications (over-the-counter, prescriptions, illicit drugs), all items previously/currently identified as a safety concern.  The family member/significant other verbalizes understanding of the suicide prevention education information provided.  The family member/significant other agrees to remove the items of safety concern listed above.  Valerie Lee 11/28/2014, 3:40 PM

## 2014-11-29 MED ORDER — ARIPIPRAZOLE 10 MG PO TABS
10.0000 mg | ORAL_TABLET | Freq: Every day | ORAL | Status: DC
  Administered 2014-11-29: 10 mg via ORAL
  Filled 2014-11-29 (×2): qty 1

## 2014-11-29 MED ORDER — ARIPIPRAZOLE ER 400 MG IM SUSR
400.0000 mg | INTRAMUSCULAR | Status: DC
  Administered 2014-12-01: 400 mg via INTRAMUSCULAR

## 2014-11-29 NOTE — Progress Notes (Signed)
[facility] MD Progress Note  11/29/2014 2:05 PM Valerie Lee  MRN:  [id number] Subjective: One of my medicine causing difficulty in swallowing.  I do not want to take that medicine.  I like Abilify.     Objective:  Patient seen and chart reviewed.  She's been taking Prolixin and also started Abilify however she developed side effects including difficulty in swallowing which could be due to Prolixin.  She feel comfortable taking Abilify.  She wants to stay on Abilify and like to discontinue Prolixin.  Overall she felt her depression and hallucinations are getting better.  She still have difficulty sleeping and the night .  She admitted for past few weeks she has been more depressed and could not sleep.  She have suicidal thoughts but they're less intense and less frequent from the past.  She is taking Cogentin, Benadryl to help reducing the side effects of Prolixin.  She denies any tremors or shakes.  She is going to the groups and she's been participating to some extent.    Principal Problem: Schizoaffective disorder, depressive type Diagnosis:   Patient Active Problem List   Diagnosis Date Noted  . 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 Schizoaffective disorder, depressive type [F25.1] 11/24/2014  . Fibroid uterus [D25.9] 05/12/2011  . Pelvic pain [R10.2] 05/12/2011   Total Time spent with patient: 30 minutes   Past Medical History:  Past Medical History  Diagnosis Date  . G6PD (glucose 6 phosphatase deficiency)   . Anemia   . G6PD deficiency anemia   . Blood transfusion   . Asthma     rarely uses albuterol rescue  . Mental disorder   . Schizophrenia   . Schizophrenia   . Bipolar 1 disorder     Past Surgical History  Procedure Laterality Date  . Tubal ligation    . Cholecystectomy  2011  . Abdominal hysterectomy  05/26/2011    Procedure: HYSTERECTOMY ABDOMINAL;  Surgeon: Mora Bellman, MD;  Location: [facility];  Service: Gynecology;  Laterality: N/A;  . Tubal ligation      Family History:  Family History  Problem Relation Age of Onset  . Hypertension Mother    Social History:  History  Alcohol Use No     History  Drug Use  . Yes  . Special: Marijuana    Social History   Social History  . Marital Status: Married    Spouse Name: N/A  . Number of Children: N/A  . Years of Education: N/A   Social History Main Topics  . Smoking status: Current Every Day Smoker -- 1.50 packs/day for 24 years    Types: Cigarettes  . Smokeless tobacco: Never Used  . Alcohol Use: No  . Drug Use: Yes    Special: Marijuana  . Sexual Activity: Yes    Birth Control/ Protection: Surgical   Other Topics Concern  . None   Social History Narrative   Additional History:    Sleep: Fair  Appetite:  Fair   Musculoskeletal: Strength & Muscle Tone: within normal limits Gait & Station: normal Patient leans: N/A   Psychiatric Specialty Exam: Physical Exam  Review of Systems  Musculoskeletal: Positive for joint pain.  Psychiatric/Behavioral: Positive for depression and hallucinations. The patient is nervous/anxious. The patient does not have insomnia.   All other systems reviewed and are negative.   Blood pressure 90/62, pulse 95, temperature 98.2 F (36.8 C), temperature source Oral, resp. rate 16, height 5\' 1"  (1.549 m), weight 58.514 kg (129 lb), last  menstrual period 04/12/2011, SpO2 100 %.Body mass index is 24.39 kg/(m^2).  General Appearance: Fairly Groomed  Engineer, water::  Fair  Speech:  Slow  Volume:  Decreased  Mood:  Anxious and Depressed  Affect:  Depressed  Thought Process:  Linear  Orientation:  Full (Time, Place, and Person)  Thought Content:  Hallucinations: Auditory Command:  asks her to hurt self and Rumination improving - is lower than on admission  Suicidal Thoughts:  No  Homicidal Thoughts:  No  Memory:  Immediate;   Fair Recent;   Fair Remote;   Fair  Judgement:  Impaired  Insight:  Shallow  Psychomotor Activity:  Decreased   Concentration:  Fair  Recall:  [employer] of Knowledge:Fair  Language: Fair  Akathisia:  No  Handed:  Right  AIMS (if indicated):     Assets:  Communication Skills Desire for Improvement  ADL's:  Intact  Cognition: WNL  Sleep:  Number of Hours: 6.5     Current Medications: Current Facility-Administered Medications  Medication Dose Route Frequency Provider Last Rate Last Dose  . acetaminophen (TYLENOL) tablet 650 mg  650 mg Oral Q6H PRN Patrecia Pour, NP   650 mg at 11/29/14 0752  . albuterol (PROVENTIL HFA;VENTOLIN HFA) 108 (90 BASE) MCG/ACT inhaler 2 puff  2 puff Inhalation Q6H PRN Patrecia Pour, NP      . alum & mag hydroxide-simeth (MAALOX/MYLANTA) 200-200-20 MG/5ML suspension 30 mL  30 mL Oral Q4H PRN Patrecia Pour, NP      . ARIPiprazole (ABILIFY) tablet 10 mg  10 mg Oral QHS Kathlee Nations, MD      . Derrill Memo ON 12/01/2014] ARIPiprazole SUSR 400 mg  400 mg Intramuscular Q28 days Ursula Alert, MD      . benztropine (COGENTIN) tablet 1 mg  1 mg Oral BID PC Ursula Alert, MD   1 mg at 11/28/14 1806  . fluPHENAZine (PROLIXIN) tablet 2.5 mg  2.5 mg Oral QPC supper Ursula Alert, MD   2.5 mg at 11/28/14 1807  . hydrOXYzine (ATARAX/VISTARIL) tablet 25 mg  25 mg Oral Q6H PRN Patrecia Pour, NP   25 mg at 11/27/14 2023  . lamoTRIgine (LAMICTAL) tablet 25 mg  25 mg Oral Daily Niel Hummer, NP   25 mg at 11/28/14 0803  . lidocaine (LIDODERM) 5 % 1 patch  1 patch Transdermal Daily Ursula Alert, MD   1 patch at 11/27/14 0821  . magnesium hydroxide (MILK OF MAGNESIA) suspension 30 mL  30 mL Oral Daily PRN Patrecia Pour, NP      . nicotine polacrilex (NICORETTE) gum 2 mg  2 mg Oral PRN Ursula Alert, MD   2 mg at 11/28/14 1008  . ondansetron (ZOFRAN) tablet 4 mg  4 mg Oral Q8H PRN Patrecia Pour, NP      . traZODone (DESYREL) tablet 50 mg  50 mg Oral QHS PRN Ursula Alert, MD   50 mg at 11/28/14 2256    Lab Results:  No results found for this or any previous visit (from the past  48 hour(s)).  Physical Findings: AIMS: Facial and Oral Movements Muscles of Facial Expression: None, normal Lips and Perioral Area: None, normal Jaw: None, normal Tongue: None, normal,Extremity Movements Upper (arms, wrists, hands, fingers): None, normal Lower (legs, knees, ankles, toes): None, normal, Trunk Movements Neck, shoulders, hips: None, normal, Overall Severity Severity of abnormal movements (highest score from questions above): None, normal Incapacitation due to abnormal movements: None, normal Patient's  awareness of abnormal movements (rate only patient's report): No Awareness, Dental Status Current problems with teeth and/or dentures?: No Does patient usually wear dentures?: No  CIWA:    COWS:     Assessment:  CELSA NORDAHL is a 39 y.o.AA female who has a hx of schizoaffective do , presented to [facility] voluntary with members of her church due to having auditory and visual hallucinations .    Treatment Plan Summary: Daily contact with patient to assess and evaluate symptoms and progress in treatment and Medication management   Discontinue Prolixin.  Increase Abilify 10 mg daily.  Patient had a good response with Abilify in the past.  Monitor closely for EPS and acute dystonic reaction.  Patient do not recall having dystonia or EPS from Abilify.  Continue Cogentin 1mg  po bid for EPS. Will continue Lamictal 25 mg po daily for mood lability, initiated yesterday. She has been tolerating it well.  She has no rash or itching. Will continue Trazodone 50 mg po qhs prn for sleep. Will provide pain management - lidocaine patch for pain/continue Tylenol 650 mg po qhs prn . Will continue to monitor vitals ,medication compliance and treatment side effects while patient is here.  Will monitor for medical issues as well as call consult as needed.  CSW will start working on disposition.  Patient to participate in therapeutic milieu .    Medical Decision Making:  Established  Problem, Stable/Improving (1), New problem, with additional work up planned, Review of Psycho-Social Stressors (1), Review of Last Therapy Session (1), Review of Medication Regimen & Side Effects (2) and Review of New Medication or Change in Dosage (2)     Alyus Mofield T. MD 11/29/2014, 2:05 PM

## 2014-11-29 NOTE — Progress Notes (Addendum)
Writer spoke with patient 1:1 and she reports having had a good day. She was compliant with scheduled medications, attended group this evening and participated. She c/o hip pain and requested tylenol which she received along with 2 heat packs. She was pleasant and denies si/hi/a/v hallucinations. Safety maintained on unit with 15 min checks. Support and encouragement given.

## 2014-11-29 NOTE — B[facility] Group Notes (Signed)
[facility] Group Notes:  (Nursing/MHT/Case Management/Adjunct)  Date:  11/29/2014  Time:10:00  Type of Therapy:  Nurse Education  Participation Level:  Did Not Attend   Forrestine Him 11/29/2014, 10:52 AM

## 2014-11-29 NOTE — [facility] Group Notes (Addendum)
[facility] LCSW Group Therapy  11/29/2014 1:54 PM   Participation Level: Active  Participation Quality: Attentive, Sharing and Supportive  Affect: Appropriate  Cognitive: Alert and Oriented  Insight: Developing/Improving and Engaged  Engagement in Therapy: Developing/Improving and Engaged  Modes of Intervention: Clarification, Confrontation, Discussion, Education, Exploration, Limit-setting, Orientation, Problem-solving, Rapport Building, Art therapist, Socialization and Support  Summary of Progress/Problems: The main focus of today's process group was for the patient to identify ways in which they have in the past sabotaged their own recovery. Motivational Interviewing was utilized to ask the group members what they get out of their substance use, and what reasons they may have for wanting to change. The Stages of Change were explained using a handout, and patients identified where they currently are with regard to stages of change. Patient identified "lashing out" as a sabotage behavior. She discussed utilizing her faith and medication noncompliance to help her regulate her feelings of anger and frustration. Patient reports that she is looking forward to spending time with her children and going to church when she returns home.    Tilden Fossa, MSW, [place] Worker [facility] [phone]

## 2014-11-29 NOTE — Progress Notes (Signed)
D:Per patient self inventory form pt reports she slept good last night with the use of sleep medication. She reports a good appetite, normal energy level, good concentration. She rates depression 0/10, hopelessness 0/10, anxiety 0/10- all on 0-10 scale, 10 being the worse. She denies SI/HI. Denies AVH. She c/o HA 9/10. She reports her goal for the day "going to afternoon group." She reports she will meet her goal by "stay up after lunch." Pt refused all morning medication, she reports the medication "makes her tongue swell." Pt reports she wants to speak to a doctor before she will take her medication. Behavior calm and cooperative.   A: Special checks q 15 mins in place for safety. Encourgement and support provided. Pt encouraged to go to morning nursing group. NP notified of pt concern.  R:Safety maintained. Pt refused medication regimen. Pt did not attend nursing group. Will continue to monitor.

## 2014-11-29 NOTE — Progress Notes (Signed)
[facility] Group Notes:  (Nursing/MHT/Case Management/Adjunct)  Date:  11/29/2014  Time:  8:51 PM  Type of Therapy:  Psychoeducational Skills  Participation Level:  Active  Participation Quality:  Appropriate  Affect:  Appropriate  Cognitive:  Appropriate  Insight:  Improving  Engagement in Group:  Engaged  Modes of Intervention:  Education  Summary of Progress/Problems: The patient shared with the group that she had a good day overall since she was able to meet some new people from the other hallways and because she went outside for fresh air. As a theme for the day, her coping skill will be to listen to her [place] and to read the Bible.   Archie Balboa S 11/29/2014, 8:51 PM

## 2014-11-30 MED ORDER — ARIPIPRAZOLE 10 MG PO TABS: 10.0000 mg | ORAL_TABLET | Freq: Every morning | ORAL | Status: DC

## 2014-11-30 MED ORDER — ARIPIPRAZOLE 10 MG PO TABS
10.0000 mg | ORAL_TABLET | Freq: Every day | ORAL | Status: DC
  Administered 2014-11-30 – 2014-12-01 (×2): 10 mg via ORAL
  Filled 2014-11-30 (×5): qty 1

## 2014-11-30 NOTE — Progress Notes (Signed)
[facility] Group Notes:  (Nursing/MHT/Case Management/Adjunct)  Date:  11/30/2014  Time:  9:22 PM  Type of Therapy:  Psychoeducational Skills  Participation Level:  Active  Participation Quality:  Appropriate  Affect:  Appropriate  Cognitive:  Appropriate  Insight:  Appropriate  Engagement in Group:  Engaged  Modes of Intervention:  Education  Summary of Progress/Problems: The patient expressed in group this evening that she had a "pretty good" day overall. The patient attributed her positive day to being able to stay awake all day and to going outside for fresh air. As a theme for the day, her support system will be comprised of her daughter, pastor, and best friend.   Kermitt Harjo S 11/30/2014, 9:22 PM

## 2014-11-30 NOTE — Progress Notes (Signed)
[facility] MD Progress Note  11/30/2014 9:44 AM Valerie Lee  MRN:  [id number] Subjective: I don't have any problem taking Abilify.  I think I'm feeling better.  I still have poor sleep.       Objective:  Patient seen and chart reviewed.  Since Prolixin discontinued she has no more EPS.  However she still have poor sleep and racing thoughts.  She remembered taking Abilify in the morning in the past.  She wants to switch Abilify during the day because she felt insomnia could be due to Abilify.  Overall her mood has been better.  She is going to the groups.  During the day she is tired which could be lack of sleep.  She still have suicidal thoughts but they're less intense and less frequent.  She still feels depressed and isolated but hoping medicine will help her.  Her appetite is okay.  Reviewed her blood work results.  Her hemoglobin A1c is normal.  Principal Problem: Schizoaffective disorder, depressive type Diagnosis:   Patient Active Problem List   Diagnosis Date Noted  . 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 Schizoaffective disorder, depressive type [F25.1] 11/24/2014  . Fibroid uterus [D25.9] 05/12/2011  . Pelvic pain [R10.2] 05/12/2011   Total Time spent with patient: 20 minutes   Past Medical History:  Past Medical History  Diagnosis Date  . G6PD (glucose 6 phosphatase deficiency)   . Anemia   . G6PD deficiency anemia   . Blood transfusion   . Asthma     rarely uses albuterol rescue  . Mental disorder   . Schizophrenia   . Schizophrenia   . Bipolar 1 disorder     Past Surgical History  Procedure Laterality Date  . Tubal ligation    . Cholecystectomy  2011  . Abdominal hysterectomy  05/26/2011    Procedure: HYSTERECTOMY ABDOMINAL;  Surgeon: Mora Bellman, MD;  Location: [facility];  Service: Gynecology;  Laterality: N/A;  . Tubal ligation     Family History:  Family History  Problem Relation Age of Onset  . Hypertension Mother    Social History:  History   Alcohol Use No     History  Drug Use  . Yes  . Special: Marijuana    Social History   Social History  . Marital Status: Married    Spouse Name: N/A  . Number of Children: N/A  . Years of Education: N/A   Social History Main Topics  . Smoking status: Current Every Day Smoker -- 1.50 packs/day for 24 years    Types: Cigarettes  . Smokeless tobacco: Never Used  . Alcohol Use: No  . Drug Use: Yes    Special: Marijuana  . Sexual Activity: Yes    Birth Control/ Protection: Surgical   Other Topics Concern  . None   Social History Narrative   Additional History:    Sleep: Fair  Appetite:  Fair   Musculoskeletal: Strength & Muscle Tone: within normal limits Gait & Station: normal Patient leans: N/A  Recent Results (from the past 2160 hour(s))  Basic metabolic panel     Status: None   Collection Time: 11/23/14 12:53 PM  Result Value Ref Range   Sodium 138 135 - 145 mmol/L   Potassium 3.7 3.5 - 5.1 mmol/L   Chloride 110 101 - 111 mmol/L   CO2 23 22 - 32 mmol/L   Glucose, Bld 99 65 - 99 mg/dL   BUN 11 6 - 20 mg/dL   Creatinine, Ser 0.74 0.44 -  1.00 mg/dL   Calcium 9.2 8.9 - 10.3 mg/dL   GFR calc non Af Amer >60 >60 mL/min   GFR calc Af Amer >60 >60 mL/min    Comment: (NOTE) The eGFR has been calculated using the CKD EPI equation. This calculation has not been validated in all clinical situations. eGFR's persistently <60 mL/min signify possible Chronic Kidney Disease.    Anion gap 5 5 - 15  CBC     Status: Abnormal   Collection Time: 11/23/14 12:53 PM  Result Value Ref Range   WBC 12.6 (H) 4.0 - 10.5 K/uL   RBC 3.94 3.87 - 5.11 MIL/uL   Hemoglobin 12.9 12.0 - 15.0 g/dL   HCT 38.3 36.0 - 46.0 %   MCV 97.2 78.0 - 100.0 fL   MCH 32.7 26.0 - 34.0 pg   MCHC 33.7 30.0 - 36.0 g/dL   RDW 12.4 11.5 - 15.5 %   Platelets 293 150 - 400 K/uL  Ethanol     Status: None   Collection Time: 11/23/14 12:58 PM  Result Value Ref Range   Alcohol, Ethyl (B) <5 <5 mg/dL     Comment:        LOWEST DETECTABLE LIMIT FOR SERUM ALCOHOL IS 5 mg/dL FOR MEDICAL PURPOSES ONLY   I-stat troponin, ED     Status: None   Collection Time: 11/23/14  1:02 PM  Result Value Ref Range   Troponin i, poc 0.00 0.00 - 0.08 ng/mL   Comment 3            Comment: Due to the release kinetics of cTnI, a negative result within the first hours of the onset of symptoms does not rule out myocardial infarction with certainty. If myocardial infarction is still suspected, repeat the test at appropriate intervals.   I-stat chem 8, ed     Status: None   Collection Time: 11/23/14  1:05 PM  Result Value Ref Range   Sodium 140 135 - 145 mmol/L   Potassium 3.7 3.5 - 5.1 mmol/L   Chloride 107 101 - 111 mmol/L   BUN 10 6 - 20 mg/dL   Creatinine, Ser 0.80 0.44 - 1.00 mg/dL   Glucose, Bld 98 65 - 99 mg/dL   Calcium, Ion 1.12 1.12 - 1.23 mmol/L   TCO2 20 0 - 100 mmol/L   Hemoglobin 14.3 12.0 - 15.0 g/dL   HCT 42.0 36.0 - 46.0 %  I-Stat CG4 Lactic Acid, ED     Status: None   Collection Time: 11/23/14  1:05 PM  Result Value Ref Range   Lactic Acid, Venous 1.44 0.5 - 2.0 mmol/L  Urinalysis, Routine w reflex microscopic (not at [facility])     Status: Abnormal   Collection Time: 11/23/14  1:47 PM  Result Value Ref Range   Color, Urine YELLOW YELLOW   APPearance TURBID (A) CLEAR   Specific Gravity, Urine 1.016 1.005 - 1.030   pH 6.0 5.0 - 8.0   Glucose, UA NEGATIVE NEGATIVE mg/dL   Hgb urine dipstick SMALL (A) NEGATIVE   Bilirubin Urine NEGATIVE NEGATIVE   Ketones, ur NEGATIVE NEGATIVE mg/dL   Protein, ur NEGATIVE NEGATIVE mg/dL   Urobilinogen, UA 0.2 0.0 - 1.0 mg/dL   Nitrite NEGATIVE NEGATIVE   Leukocytes, UA SMALL (A) NEGATIVE  Urine microscopic-add on     Status: Abnormal   Collection Time: 11/23/14  1:47 PM  Result Value Ref Range   Squamous Epithelial / LPF MANY (A) RARE   WBC, UA 3-6 <3  WBC/hpf   Bacteria, UA FEW (A) RARE   Urine-Other AMORPHOUS URATES/PHOSPHATES     Comment:  TRICHOMONAS PRESENT  I-Stat Troponin, ED (not at [facility])     Status: None   Collection Time: 11/23/14  5:26 PM  Result Value Ref Range   Troponin i, poc 0.00 0.00 - 0.08 ng/mL   Comment 3            Comment: Due to the release kinetics of cTnI, a negative result within the first hours of the onset of symptoms does not rule out myocardial infarction with certainty. If myocardial infarction is still suspected, repeat the test at appropriate intervals.   Urine rapid drug screen (hosp performed)     Status: Abnormal   Collection Time: 11/23/14  6:03 PM  Result Value Ref Range   Opiates NONE DETECTED NONE DETECTED   Cocaine NONE DETECTED NONE DETECTED   Benzodiazepines NONE DETECTED NONE DETECTED   Amphetamines NONE DETECTED NONE DETECTED   Tetrahydrocannabinol POSITIVE (A) NONE DETECTED   Barbiturates NONE DETECTED NONE DETECTED    Comment:        DRUG SCREEN FOR MEDICAL PURPOSES ONLY.  IF CONFIRMATION IS NEEDED FOR ANY PURPOSE, NOTIFY LAB WITHIN 5 DAYS.        LOWEST DETECTABLE LIMITS FOR URINE DRUG SCREEN Drug Class       Cutoff (ng/mL) Amphetamine      1000 Barbiturate      200 Benzodiazepine   158 Tricyclics       309 Opiates          300 Cocaine          300 THC              50   Hemoglobin A1c     Status: None   Collection Time: 11/26/14  6:38 AM  Result Value Ref Range   Hgb A1c MFr Bld 5.0 4.8 - 5.6 %    Comment: (NOTE)         Pre-diabetes: 5.7 - 6.4         Diabetes: >6.4         Glycemic control for adults with diabetes: <7.0    Mean Plasma Glucose 97 mg/dL    Comment: (NOTE) Performed At: [facility] [place], [place] [id number] Lindon Romp MD [id number] Performed at [facility]   Lipid panel     Status: Abnormal   Collection Time: 11/26/14  6:38 AM  Result Value Ref Range   Cholesterol 185 0 - 200 mg/dL   Triglycerides 165 (H) <150 mg/dL   HDL 39 (L) >40 mg/dL   Total CHOL/HDL Ratio 4.7 RATIO   VLDL  33 0 - 40 mg/dL   LDL Cholesterol 113 (H) 0 - 99 mg/dL    Comment:        Total Cholesterol/HDL:CHD Risk Coronary Heart Disease Risk Table                     Men   Women  1/2 Average Risk   3.4   3.3  Average Risk       5.0   4.4  2 X Average Risk   9.6   7.1  3 X Average Risk  23.4   11.0        Use the calculated Patient Ratio above and the CHD Risk Table to determine the patient's CHD Risk.        ATP III  CLASSIFICATION (LDL):  <100     mg/dL   Optimal  100-129  mg/dL   Near or Above                    Optimal  130-159  mg/dL   Borderline  160-189  mg/dL   High  >190     mg/dL   Very High Performed at [facility]   TSH     Status: None   Collection Time: 11/26/14  6:38 AM  Result Value Ref Range   TSH 2.067 0.350 - 4.500 uIU/mL    Comment: Performed at [facility]  Prolactin     Status: Abnormal   Collection Time: 11/26/14  6:38 AM  Result Value Ref Range   Prolactin 68.7 (H) 4.8 - 23.3 ng/mL    Comment: (NOTE) Performed At: [facility] [place], [place] [id number] Lindon Romp MD [id number] Performed at [facility]   Comprehensive metabolic panel     Status: Abnormal   Collection Time: 11/26/14  6:38 AM  Result Value Ref Range   Sodium 138 135 - 145 mmol/L   Potassium 3.9 3.5 - 5.1 mmol/L   Chloride 106 101 - 111 mmol/L   CO2 25 22 - 32 mmol/L   Glucose, Bld 125 (H) 65 - 99 mg/dL   BUN 10 6 - 20 mg/dL   Creatinine, Ser 0.88 0.44 - 1.00 mg/dL   Calcium 9.0 8.9 - 10.3 mg/dL   Total Protein 7.7 6.5 - 8.1 g/dL   Albumin 3.8 3.5 - 5.0 g/dL   AST 22 15 - 41 U/L   ALT 11 (L) 14 - 54 U/L   Alkaline Phosphatase 51 38 - 126 U/L   Total Bilirubin 0.3 0.3 - 1.2 mg/dL   GFR calc non Af Amer >60 >60 mL/min   GFR calc Af Amer >60 >60 mL/min    Comment: (NOTE) The eGFR has been calculated using the CKD EPI equation. This calculation has not been validated in all clinical situations. eGFR's  persistently <60 mL/min signify possible Chronic Kidney Disease.    Anion gap 7 5 - 15    Comment: Performed at [facility]   Psychiatric Specialty Exam: Physical Exam  Review of Systems  Musculoskeletal: Positive for joint pain.  Psychiatric/Behavioral: Positive for depression and hallucinations. The patient is nervous/anxious. The patient does not have insomnia.   All other systems reviewed and are negative.   Blood pressure 90/56, pulse 75, temperature 98 F (36.7 C), temperature source Oral, resp. rate 16, height _0  (1.549 m), weight 58.514 kg (129 lb), last menstrual period 04/12/2011, SpO2 100 %.Body mass index is 24.39 kg/(m^2).  General Appearance: Fairly Groomed  Engineer, water::  Fair  Speech:  Slow  Volume:  Decreased  Mood:  Anxious and Depressed  Affect:  Depressed  Thought Process:  Linear  Orientation:  Full (Time, Place, and Person)  Thought Content:  Hallucinations: Auditory   Suicidal Thoughts:  No  Homicidal Thoughts:  No  Memory:  Immediate;   Fair Recent;   Fair Remote;   Fair  Judgement:  Impaired  Insight:  Shallow  Psychomotor Activity:  Decreased  Concentration:  Fair  Recall:  Krebs: Fair  Akathisia:  No  Handed:  Right  AIMS (if indicated):     Assets:  Communication Skills Desire for Improvement  ADL's:  Intact  Cognition: WNL  Sleep:  Number of Hours: 1     Current Medications: Current Facility-Administered Medications  Medication Dose Route Frequency Provider Last Rate Last Dose  . acetaminophen (TYLENOL) tablet 650 mg  650 mg Oral Q6H PRN Patrecia Pour, NP   650 mg at 11/30/14 0748  . albuterol (PROVENTIL HFA;VENTOLIN HFA) 108 (90 BASE) MCG/ACT inhaler 2 puff  2 puff Inhalation Q6H PRN Patrecia Pour, NP      . alum & mag hydroxide-simeth (MAALOX/MYLANTA) 200-200-20 MG/5ML suspension 30 mL  30 mL Oral Q4H PRN Patrecia Pour, NP      . ARIPiprazole (ABILIFY) tablet 10 mg  10 mg Oral  QHS Kathlee Nations, MD   10 mg at 11/29/14 2117  . [START ON 12/01/2014] ARIPiprazole SUSR 400 mg  400 mg Intramuscular Q28 days Ursula Alert, MD      . benztropine (COGENTIN) tablet 1 mg  1 mg Oral BID PC Saramma Eappen, MD   1 mg at 11/28/14 1806  . hydrOXYzine (ATARAX/VISTARIL) tablet 25 mg  25 mg Oral Q6H PRN Patrecia Pour, NP   25 mg at 11/30/14 0021  . lamoTRIgine (LAMICTAL) tablet 25 mg  25 mg Oral Daily Niel Hummer, NP   25 mg at 11/28/14 0803  . lidocaine (LIDODERM) 5 % 1 patch  1 patch Transdermal Daily Ursula Alert, MD   1 patch at 11/27/14 0821  . magnesium hydroxide (MILK OF MAGNESIA) suspension 30 mL  30 mL Oral Daily PRN Patrecia Pour, NP      . nicotine polacrilex (NICORETTE) gum 2 mg  2 mg Oral PRN Ursula Alert, MD   2 mg at 11/28/14 1008  . ondansetron (ZOFRAN) tablet 4 mg  4 mg Oral Q8H PRN Patrecia Pour, NP      . traZODone (DESYREL) tablet 50 mg  50 mg Oral QHS PRN Ursula Alert, MD   50 mg at 11/29/14 2117    Lab Results:  No results found for this or any previous visit (from the past 22 hour(s)).  Physical Findings: AIMS: Facial and Oral Movements Muscles of Facial Expression: None, normal Lips and Perioral Area: None, normal Jaw: None, normal Tongue: None, normal,Extremity Movements Upper (arms, wrists, hands, fingers): None, normal Lower (legs, knees, ankles, toes): None, normal, Trunk Movements Neck, shoulders, hips: None, normal, Overall Severity Severity of abnormal movements (highest score from questions above): None, normal Incapacitation due to abnormal movements: None, normal Patient's awareness of abnormal movements (rate only patient's report): No Awareness, Dental Status Current problems with teeth and/or dentures?: No Does patient usually wear dentures?: No  CIWA:    COWS:     Assessment:  KHAMANI DANIELY is a 39 y.o.AA female who has a hx of schizoaffective do , presented to [facility] voluntary with members of her church due to having  auditory and visual hallucinations .    Treatment Plan Summary: Daily contact with patient to assess and evaluate symptoms and progress in treatment and Medication management   Change Abilify to daytime.  Patient had a good response with Abilify in the past.  Monitor closely for EPS and acute dystonic reaction.  Continue Cogentin 66m po bid for EPS. Will continue Lamictal 25 mg po daily for mood lability, initiated yesterday. She has been tolerating it well.  She has no rash or itching. Will continue Trazodone 50 mg po qhs prn for sleep. Will provide pain management - lidocaine patch for pain/continue Tylenol 650 mg po qhs prn . Will continue to  monitor vitals ,medication compliance and treatment side effects while patient is here.  Will monitor for medical issues as well as call consult as needed.  CSW will start working on disposition.  Patient to participate in therapeutic milieu .    Medical Decision Making:  Established Problem, Stable/Improving (1), Review of Psycho-Social Stressors (1), Review or order clinical lab tests (1), Review of Last Therapy Session (1), Review of Medication Regimen & Side Effects (2) and Review of New Medication or Change in Dosage (2)     Khaila Velarde T. MD 11/30/2014, 9:44 AM

## 2014-11-30 NOTE — B[facility] Group Notes (Signed)
[facility] LCSW Group Therapy  11/30/2014 11:00 AM   Type of Therapy:  Group Therapy  Participation Level:  Did Not Attend  Regan Lemming, LCSW 11/30/2014 12:23 PM

## 2014-11-30 NOTE — Progress Notes (Addendum)
D: Per patient self inventory form pt reports she slept poor last night with the use of sleep medication. She reports a normal energy level, good concentration. She rates depression 0/10, hopelessness 0/10, anxiety 1/10- all on 0-10 scale, 10 being the worse. She c/o HA 8/10. She reports her goal is "to get some sleep" and will "stay in my room" to help meet her goal. Pleasant on approach. Pt continues to refuse all medication except Abilify. "Lamictal swells up my tongue, I will just take Abilify."  "I do not need the lidocaine patch, I'm not in that much pain." Pt denies SI/HI. Denies AVH  A:Special checks q 15 mins in place for safety. Encouragement and counseling provided.   R: Safety maintained. Will continue to monitor.

## 2014-11-30 NOTE — Plan of Care (Signed)
Problem: Alteration in mood Goal: LTG-Patient reports reduction in suicidal thoughts (Patient reports reduction in suicidal thoughts and is able to verbalize a safety plan for whenever patient is feeling suicidal)  Outcome: Progressing Patient currently denies suicidal ideations and reports that she will inform staff if thoughts return and she feels that she will act upon them.

## 2014-11-30 NOTE — BHH Group Notes (Signed)
[place] Group Notes:  (Nursing/MHT/Case Management/Adjunct)  Date:  11/30/2014  Time: 1300  Type of Therapy:  Nurse Education  Participation Level:  Active  Participation Quality:  Appropriate and Sharing  Affect:  Flat  Cognitive:  Alert and Appropriate  Insight:  Good  Engagement in Group:  Improving  Modes of Intervention:  Activity, Clarification, Discussion, Orientation, Socialization and Support  Summary of Progress/Problems:  Valerie Lee 11/30/2014, 2:54 PM

## 2014-11-30 NOTE — Progress Notes (Signed)
Writer has observed patient up in the dayroom watching tv and minimal interaction with peers. Writer spoke with her 1:1 and she reports having had a good day. She reports that her medication will be given in a shot and she thinks this will work out better for her so that she doesn't have to try and remember to take a pill everyday. She attended group this evening. She reports being hopeful that she will be able to sleep tonight since her abilify has been switched to daytime. She reports that she will be discharging on tomorrow but Probation officer has not seen a note by the doctor making reference to her discharging. She denies si/hi/a/v hallucinations. Support given and safety maintained on unit with 15 min checks.

## 2014-11-30 NOTE — Plan of Care (Signed)
Problem: Alteration in thought process Goal: STG-Patient is able to sleep at least 6 hours per night Outcome: Not Progressing Patient had difficulty sleeping last night and got 1 hour of sleep. Patient reports that she will mention this to her doctor.

## 2014-12-01 MED ORDER — BENZTROPINE MESYLATE 1 MG PO TABS: 1.0000 mg | ORAL_TABLET | Freq: Two times a day (BID) | ORAL | Status: DC

## 2014-12-01 MED ORDER — TRAZODONE HCL 50 MG PO TABS: 50.0000 mg | ORAL_TABLET | Freq: Every evening | ORAL | Status: DC | PRN

## 2014-12-01 MED ORDER — NICOTINE POLACRILEX 2 MG MT GUM: 2.0000 mg | CHEWING_GUM | OROMUCOSAL | Status: DC | PRN

## 2014-12-01 MED ORDER — HYDROXYZINE HCL 25 MG PO TABS: 25.0000 mg | ORAL_TABLET | Freq: Four times a day (QID) | ORAL | Status: DC | PRN

## 2014-12-01 MED ORDER — ARIPIPRAZOLE 10 MG PO TABS: 10.0000 mg | ORAL_TABLET | Freq: Every day | ORAL | Status: DC

## 2014-12-01 MED ORDER — ARIPIPRAZOLE ER 400 MG IM SUSR: 400.0000 mg | INTRAMUSCULAR | Status: DC

## 2014-12-01 MED ORDER — LAMOTRIGINE 25 MG PO TABS: 25.0000 mg | ORAL_TABLET | Freq: Every day | ORAL | Status: DC

## 2014-12-01 MED ORDER — LIDOCAINE 5 % EX PTCH: 1.0000 | MEDICATED_PATCH | Freq: Every day | CUTANEOUS | Status: DC

## 2014-12-01 NOTE — Progress Notes (Signed)
D:Per patient self inventory form pt reports she slept good last night with the use of sleep medication. She reports a good appetite, normal energy level, good concentration. She rates depression 0/10, hopelessness 0/10, anxiety 2/10-all on 0-10 scale, 10 being the worse. She denies SI/HI. Denies AVH. She denies physical pain. She reports her goal for the day "I should be going home today the DR said." Pt presents with bright affect "I'm having a great day."   A:Special checks q 15 mins in place for safety. Medication administered per MD order (see eMAR) Discharge planning in progress. Encouragement and support provided.   R:Safety maintained. Compliant with medication regimen. Will continue to monitor.

## 2014-12-01 NOTE — Tx Team (Signed)
Interdisciplinary Treatment Plan Update (Adult)  Date:  12/01/2014   Time Reviewed:  11:52 AM   Progress in Treatment: Attending groups: Yes. Participating in groups:  Yes. Taking medication as prescribed:  Yes. Tolerating medication:  Yes. Family/Significant othe contact made:  Yes Patient understands diagnosis:  Yes  As evidenced by seeking help with command hallucinations Discussing patient identified problems/goals with staff:  Yes, see initial care plan. Medical problems stabilized or resolved:  Yes. Denies suicidal/homicidal ideation: Yes. Issues/concerns per patient self-inventory:  No. Other:  New problem(s) identified:  Discharge Plan or Barriers: return home, follow up outpt  Reason for Continuation of Hospitalization:   Comments:  Subjective:  " I still have AH asking me to hurt myself and I could not sleep due to my pain last night.'  Valerie Lee is a 39 y.o.AA female who presented to [facility] voluntary with members of her church due to having auditory and visual hallucinations of someone telling her that "it's ok" to hurt herself. Patient states that she has been feeling this way for a "couple days" and has became increasingly anxious and saw "the man" today. The patient has a long history of psychosis and mood instability. Patient has been patient in the past at [facility] and was stable on a combination of Abilify, Latuda, and Vistaril but was unable to afford the medications.  Prolixin, Lamictal trial 11/28/14 Patient seen and chart reviewed.Discussed patient with treatment team. Pt today with improvement of her AH , command type asking her to hurt self. Patient reports side effects to prolixin - her tongue got swollen last night after the PM dose of prolixin .  Will reduce the prolixin dose today for EPS , add cogentin for side effects , she was on amantadine since admission.  12/01/2014 Got Prolixin Dec today  Estimated length of stay: D/C today  New  goal(s):  Review of initial/current patient goals per problem list:   Review of initial/current patient goals per problem list:  1. Goal(s): Patient will participate in aftercare plan   Met: Yes   Target date: 3-5 days post admission date   As evidenced by: Patient will participate within aftercare plan AEB aftercare provider and housing plan at discharge being identified.  11/25/14 : Pt plans to return home, follow up outpt.    2. Goal (s): Patient will exhibit decreased depressive symptoms and suicidal ideations.   Met: Yes   Target date: 3-5 days post admission date   As evidenced by: Patient will utilize self rating of depression at 3 or below and demonstrate decreased signs of depression or be deemed stable for discharge by MD. 11/25/14  Valerie Lee rates her depression at a 6 today. 11/28/14  Valerie Lee rates her depression at a 5 today 12/01/2014 Valerie Lee denies depression today   5. Goal(s): Patient will demonstrate decreased signs of psychosis  * Met: Yes  * Target date: 3-5 days post admission date  * As evidenced by: Patient will demonstrate decreased frequency of AVH or return to baseline function 11/26/14  Valerie Lee states the command hallucinations have disappeared, but is still c/o AH   Willing to take meds 11/28/14  No signs nor symptoms of psychosis today      Attendees: Patient:  12/01/2014 11:52 AM   Family:   12/01/2014 11:52 AM   Physician:  Ursula Alert, MD 12/01/2014 11:52 AM   Nursing:   Festus Aloe, RN 12/01/2014 11:52 AM   CSW:    Roque Lias, LCSW   12/01/2014 11:52  AM   Other:  12/01/2014 11:52 AM   Other:   12/01/2014 11:52 AM   Other:  Lars Pinks, Nurse CM 12/01/2014 11:52 AM   Other:  Lucinda Dell, [facility] 12/01/2014 11:52 AM   Other:  Norberto Sorenson, [place]  12/01/2014 11:52 AM   Other:  12/01/2014 11:52 AM   Other:  12/01/2014 11:52 AM   Other:  12/01/2014 11:52 AM   Other:  12/01/2014 11:52 AM   Other:  12/01/2014 11:52 AM   Other:   12/01/2014 11:52 AM     Scribe for Treatment Team:   Trish Mage, 12/01/2014 11:52 AM

## 2014-12-01 NOTE — Discharge Summary (Signed)
Physician Discharge Summary Note  Patient:  [facility] is an 39 y.o., female MRN:  [id number] DOB:  1975/06/09 Patient phone:  [phone] (home)  Patient address:   [address] [place] [address],  Total Time spent with patient: 30 minutes  Date of Admission:  11/24/2014 Date of Discharge: 12/01/2014  Reason for Admission:  Acute psychosis   Principal Problem: Schizoaffective disorder, depressive type Discharge Diagnoses: Patient Active Problem List   Diagnosis Date Noted  . 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 Schizoaffective disorder, depressive type [F25.1] 11/24/2014  . Fibroid uterus [D25.9] 05/12/2011  . Pelvic pain [R10.2] 05/12/2011    Musculoskeletal: Strength & Muscle Tone: within normal limits Gait & Station: normal Patient leans: N/A  Psychiatric Specialty Exam: Physical Exam  Psychiatric: She has a normal mood and affect. Her speech is normal and behavior is normal. Judgment and thought content normal. Cognition and memory are normal.    Review of Systems  Constitutional: Negative.   HENT: Negative.   Eyes: Negative.   Respiratory: Negative.   Cardiovascular: Negative.   Gastrointestinal: Negative.   Genitourinary: Negative.   Musculoskeletal: Negative.   Skin: Negative.   Neurological: Negative.   Endo/Heme/Allergies: Negative.   Psychiatric/Behavioral: Negative.     Blood pressure 100/66, pulse 79, temperature 98 F (36.7 C), temperature source Oral, resp. rate 16, height 5\' 1"  (1.549 m), weight 58.514 kg (129 lb), last menstrual period 04/12/2011, SpO2 100 %.Body mass index is 24.39 kg/(m^2).  See Physician SRA     Have you used any form of tobacco in the last 30 days? (Cigarettes, Smokeless Tobacco, Cigars, and/or Pipes): Yes  Has this patient used any form of tobacco in the last 30 days? (Cigarettes, Smokeless Tobacco, Cigars, and/or Pipes) Yes, Prescription provided for nicotine gum  Past Medical History:   Past Medical History  Diagnosis Date  . G6PD (glucose 6 phosphatase deficiency)   . Anemia   . G6PD deficiency anemia   . Blood transfusion   . Asthma     rarely uses albuterol rescue  . Mental disorder   . Schizophrenia   . Schizophrenia   . Bipolar 1 disorder     Past Surgical History  Procedure Laterality Date  . Tubal ligation    . Cholecystectomy  2011  . Abdominal hysterectomy  05/26/2011    Procedure: HYSTERECTOMY ABDOMINAL;  Surgeon: Catalina Antigua, MD;  Location: [facility];  Service: Gynecology;  Laterality: N/A;  . Tubal ligation     Family History:  Family History  Problem Relation Age of Onset  . Hypertension Mother    Social History:  History  Alcohol Use No     History  Drug Use  . Yes  . Special: Marijuana    Social History   Social History  . Marital Status: Married    Spouse Name: N/A  . Number of Children: N/A  . Years of Education: N/A   Social History Main Topics  . Smoking status: Current Every Day Smoker -- 1.50 packs/day for 24 years    Types: Cigarettes  . Smokeless tobacco: Never Used  . Alcohol Use: No  . Drug Use: Yes    Special: Marijuana  . Sexual Activity: Yes    Birth Control/ Protection: Surgical   Other Topics Concern  . None   Social History Narrative    Risk to Self: Is patient at risk for suicide?: Yes Risk to Others:   Prior Inpatient Therapy:   Prior Outpatient Therapy:  Level of Care:  OP  Hospital Course:    [facility] Lee is an 39 y.o. female who presents to [facility] voluntary with members of her church due to having auditory and visual hallucinations of someone telling her that "it's ok" to hurt herself. Patient states that she has been feeling this way for a "couple days" and has became increasingly anxious and saw "the man" today. The patient has a long history of psychosis and mood instability. Patient has been patient in the past at [facility] and was stable on a combination of Abilify, Latuda, and  Vistaril but was unable to afford the medications. She was admitted to [facility] under the care of Dr. Jannifer Franklin in 2014. Her last psychiatric hospitalization in epic was in September of 2015 at [facility] where she was diagnosed as Schizoaffective, depressed type. Patient denies any recent suicide attempts but did "Try to jump in front of a bus years ago when diagnosed with mental illness." The patient reports the following today during assessment "The voices are a little better. I started seeing things in [facility]. My Renato Gails knows me very well and sent me to get evaluated. I was seeing scaring faces. Several voices telling me it was just fine to hurt myself. I have been off medications for six months. My mother passed away in 06-13-22 and somehow during the grief I got off my medications. I do good when I take the medications. But when I don't I'm very paranoid. I am always watching out for other people. If you watch me closely I eat alone. I also lose it easy. I need something to help the voices and to stabilize my mood. Sometimes I yell at my family and get very out of control. I am so scared of men. I want them to stay away so I will not hurt anyone. I get very anxious and scared when the voices are talking to me. But I would not intentionally hurt myself or anyone else. I have a very supportive daughter who helps soothe me when I'm scared." The patient denies any substance abuse but her drug screen is positive for marijuana. Denies any past episodes of mania. She reports the ongoing presence of psychotic symptoms. The patient experiences auditory command hallucinations that tell her to hurt herself and other people. [facility] is able to contract for her safety on the unit. Patient appears anxious throughout the assessment. Patient does endorse depressive symptoms in addition to psychosis. An EKG completed on 11/24/2014 showed no signs of QTc prolongation as the patient was started on Prolixin after consult conducted by Dr.  Jannifer Franklin. Nursing staff have reported that the patient at times has hidden under the covers due to continued visual hallucination of a "man".          TAMEY SEEFRIED  was admitted to the adult 500 unit where she was evaluated and her symptoms were identified. Medication management was discussed and implemented. The patient was initially started on Prolixin after psychiatric consult but developed symptoms of EPS. She reported doing well on Abilify in the past and was started on oral medication. Prior to discharge from the hospital the patient received Abilify Maintena 400 mg on 12/01/2014. She was also started on Lamictal 25 mg daily for improved mood stability. However, it is noted that she refused this medication due to complaints of tongue swelling. But this finding was not observed by staff nor was the patient noted to have trouble speaking or swallowing. She was encouraged to participate in  unit programming. Medical problems were identified and treated appropriately. Home medication was restarted as needed. She was evaluated each day by a clinical provider to ascertain the patient's response to treatment.  Improvement was noted by the patient's report of decreasing symptoms, improved sleep and appetite, affect, medication tolerance, behavior, and participation in unit programming.  The patient was asked each day to complete a self inventory noting mood, mental status, pain, new symptoms, anxiety and concerns.         She responded well to medication and being in a therapeutic and supportive environment. She reported that she was no longer experiencing auditory hallucinations. Patient was optimistic about being on a long acting injectable to avoid the need to take an oral medication everyday.  Positive and appropriate behavior was noted and the patient was motivated for recovery.  She worked closely with the treatment team and case manager to develop a discharge plan with appropriate goals. Coping skills,  problem solving as well as relaxation therapies were also part of the unit programming.         By the day of discharge she was in much improved condition than upon admission.  Symptoms were reported as significantly decreased or resolved completely. The patient denied SI/HI and voiced no AVH. She was motivated to continue taking medication with a goal of continued improvement in mental health.   REIN REINING was discharged home with a plan to follow up as noted below.  Consults:  psychiatry  Significant Diagnostic Studies:  Chemistry panel, Lipid profile, elevated Prolactin, TSH, Hemoglobin A1c,   Discharge Vitals:   Blood pressure 100/66, pulse 79, temperature 98 F (36.7 C), temperature source Oral, resp. rate 16, height 5\' 1"  (1.549 m), weight 58.514 kg (129 lb), last menstrual period 04/12/2011, SpO2 100 %. Body mass index is 24.39 kg/(m^2). Lab Results:   No results found for this or any previous visit (from the past 72 hour(s)).  Physical Findings: AIMS: Facial and Oral Movements Muscles of Facial Expression: None, normal Lips and Perioral Area: None, normal Jaw: None, normal Tongue: None, normal,Extremity Movements Upper (arms, wrists, hands, fingers): None, normal Lower (legs, knees, ankles, toes): None, normal, Trunk Movements Neck, shoulders, hips: None, normal, Overall Severity Severity of abnormal movements (highest score from questions above): None, normal Incapacitation due to abnormal movements: None, normal Patient's awareness of abnormal movements (rate only patient's report): No Awareness, Dental Status Current problems with teeth and/or dentures?: No Does patient usually wear dentures?: No  CIWA:    COWS:      See Psychiatric Specialty Exam and Suicide Risk Assessment completed by Attending Physician prior to discharge.  Discharge destination:  Home  Is patient on multiple antipsychotic therapies at discharge:  No   Has Patient had three or more failed  trials of antipsychotic monotherapy by history:  No    Recommended Plan for Multiple Antipsychotic Therapies: NA     Medication List    STOP taking these medications        citalopram 20 MG tablet  Commonly known as:  CELEXA     haloperidol 5 MG tablet  Commonly known as:  HALDOL     LATUDA PO     modafinil 200 MG tablet  Commonly known as:  PROVIGIL     prazosin 1 MG capsule  Commonly known as:  MINIPRESS      TAKE these medications      Indication   ARIPiprazole 10 MG tablet  Commonly known as:  ABILIFY  Take 1 tablet (10 mg total) by mouth daily.   Indication:  Schizoaffective Disorder     ARIPiprazole 400 MG Susr  Inject 400 mg into the muscle every 28 (twenty-eight) days.  Start taking on:  12/29/2014   Indication:  Schizoaffective     benztropine 1 MG tablet  Commonly known as:  COGENTIN  Take 1 tablet (1 mg total) by mouth 2 (two) times daily after a meal.   Indication:  Arteriosclerotic Parkinsonism     hydrOXYzine 25 MG tablet  Commonly known as:  ATARAX/VISTARIL  Take 1 tablet (25 mg total) by mouth every 6 (six) hours as needed for anxiety.   Indication:  Anxiety Neurosis     lamoTRIgine 25 MG tablet  Commonly known as:  LAMICTAL  Take 1 tablet (25 mg total) by mouth daily.   Indication:  Depression, Mood lability     lidocaine 5 %  Commonly known as:  LIDODERM  Place 1 patch onto the skin daily. Remove & Discard patch within 12 hours or as directed by MD   Indication:  Chronic pain     nicotine polacrilex 2 MG gum  Commonly known as:  NICORETTE  Take 1 each (2 mg total) by mouth as needed for smoking cessation.   Indication:  Nicotine Addiction     traZODone 50 MG tablet  Commonly known as:  DESYREL  Take 1 tablet (50 mg total) by mouth at bedtime as needed for sleep.   Indication:  Trouble Sleeping       Follow-up Information    Follow up with [facility].   Why:  If you have an appointment scheduled, go to that.  If you signed up for  Transitional Care Team with Felipa Eth, call her to get an appointment.  Her number is [phone].  If not, go to the walk-in clinic M-F after 8AM for your hospital follow up appoin   Contact information:   [address]  [place]  [phone]] [phone]      Follow-up recommendations:    Activity: No restrictions Diet: regular Tests: Prolactin level in 3 months Other: Abilify Maintena IM q28 days - first dose today 12/01/2014  Comments:   Take all your medications as prescribed by your mental healthcare provider.  Report any adverse effects and or reactions from your medicines to your outpatient provider promptly.  Patient is instructed and cautioned to not engage in alcohol and or illegal drug use while on prescription medicines.  In the event of worsening symptoms, patient is instructed to call the crisis hotline, 911 and or go to the nearest ED for appropriate evaluation and treatment of symptoms.  Follow-up with your primary care provider for your other medical issues, concerns and or health care needs.   Total Discharge Time: Greater than 30 minutes  Signed: Fransisca Kaufmann, NP-C 12/01/2014, 6:35 PM

## 2014-12-01 NOTE — Progress Notes (Signed)
  [facility] Adult Case Management Discharge Plan :  Will you be returning to the same living situation after discharge:  Yes,  home At discharge, do you have transportation home?: Yes,  family Do you have the ability to pay for your medications: Yes,  MCD  Release of information consent forms completed and in the chart;  Patient's signature needed at discharge.  Patient to Follow up at: Follow-up Information    Follow up with [facility].   Why:  If you have an appointment scheduled, go to that.  If you signed up for Transitional Care Team with Lowella Bandy, call her to get an appointment.  Her number is [phone].  If not, go to the walk-in clinic M-F after 8AM for your hospital follow up appoin   Contact information:   [place] [phone]      Patient denies SI/HI: Yes,  yes    Safety Planning and Suicide Prevention discussed: Yes,  yes  Have you used any form of tobacco in the last 30 days? (Cigarettes, Smokeless Tobacco, Cigars, and/or Pipes): Yes  Has patient been referred to the Quitline?: Yes, faxed on 12/01/14  Trish Mage 12/01/2014, 11:58 AM

## 2014-12-01 NOTE — [facility] Suicide Risk Assessment (Signed)
[facility] Discharge Suicide Risk Assessment   Demographic Factors:  NA  Total Time spent with patient: 30 minutes  Musculoskeletal: Strength & Muscle Tone: within normal limits Gait & Station: normal Patient leans: N/A  Psychiatric Specialty Exam: Physical Exam  Review of Systems  Psychiatric/Behavioral: Negative for depression, suicidal ideas and hallucinations. The patient is not nervous/anxious.   All other systems reviewed and are negative.   Blood pressure 100/66, pulse 79, temperature 98 F (36.7 C), temperature source Oral, resp. rate 16, height 5\' 1"  (1.549 m), weight 58.514 kg (129 lb), last menstrual period 04/12/2011, SpO2 100 %.Body mass index is 24.39 kg/(m^2).  General Appearance: Casual  Eye Contact::  Fair  Speech:  Clear and [id number]  Volume:  Normal  Mood:  Euthymic  Affect:  Congruent  Thought Process:  Coherent  Orientation:  Full (Time, Place, and Person)  Thought Content:  WDL  Suicidal Thoughts:  No  Homicidal Thoughts:  No  Memory:  Immediate;   Fair Recent;   Fair Remote;   Fair  Judgement:  Fair  Insight:  Fair  Psychomotor Activity:  Normal  Concentration:  Fair  Recall:  [employer] of Knowledge:Fair  Language: Fair  Akathisia:  No  Handed:  Right  AIMS (if indicated):     Assets:  Communication Skills Desire for Improvement  Sleep:  Number of Hours: 5.5  Cognition: WNL  ADL's:  Intact   Have you used any form of tobacco in the last 30 days? (Cigarettes, Smokeless Tobacco, Cigars, and/or Pipes): Yes  Has this patient used any form of tobacco in the last 30 days? (Cigarettes, Smokeless Tobacco, Cigars, and/or Pipes) Yes, Prescription provided for nicotine gum  Mental Status Per Nursing Assessment::   On Admission:     Current Mental Status by Physician: Pt denies SI/HI/AH/VH  Loss Factors: NA  Historical Factors: Impulsivity  Risk Reduction Factors:   Positive social support  Continued Clinical Symptoms:  Previous  Psychiatric Diagnoses and Treatments  Cognitive Features That Contribute To Risk:  None    Suicide Risk:  Minimal: No identifiable suicidal ideation.  Patients presenting with no risk factors but with morbid ruminations; may be classified as minimal risk based on the severity of the depressive symptoms  Principal Problem: Schizoaffective disorder, depressive type Discharge Diagnoses:  Patient Active Problem List   Diagnosis Date Noted  . 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 Schizoaffective disorder, depressive type [F25.1] 11/24/2014  . Fibroid uterus [D25.9] 05/12/2011  . Pelvic pain [R10.2] 05/12/2011    Follow-up Information    Follow up with [facility].   Why:  If you have an appointment scheduled, go to that.  If you signed up for Transitional Care Team with Lowella Bandy, call her to get an appointment.  Her number is [phone].  If not, go to the walk-in clinic M-F after 8AM for your hospital follow up appoin   Contact information:   [place] recommendations:  Activity:  No restrictions Diet:  regular Tests:  Prolactin level in 3 months Other:  Abilify Maintena IM q28 days - first dose today 12/01/2014  Is patient on multiple antipsychotic therapies at discharge:  No   Has Patient had three or more failed trials of antipsychotic monotherapy by history:  No  Recommended Plan for Multiple Antipsychotic Therapies: NA    Amyrah Pinkhasov md 12/01/2014, 9:19 AM

## 2014-12-01 NOTE — Progress Notes (Addendum)
Discharge note: Pt discharged per MD order. Discharge summary reviewed with pt. Pt verbalizes understanding . RX given to pt. Pt signed discharge summary. Pt denies SI/HI AVH at discharge. Pt denies physical pain at discharge. Pt eager for discharge. All items returned to pt from locker #42. Pt signs and verbalizes that she received all personal items. Ambulatory out of facility for discharge.

## 2015-02-12 ENCOUNTER — Emergency Department (HOSPITAL_COMMUNITY): Payer: Commercial Managed Care - HMO

## 2015-02-12 ENCOUNTER — Emergency Department (HOSPITAL_COMMUNITY)
Admission: EM | Admit: 2015-02-12 | Discharge: 2015-02-12 | Disposition: A | Discharge status: Home / Self Care | Payer: Commercial Managed Care - HMO | Attending: Emergency Medicine | Admitting: Emergency Medicine

## 2015-02-12 ENCOUNTER — Encounter (HOSPITAL_COMMUNITY): Payer: Self-pay | Admitting: Emergency Medicine

## 2015-02-12 DIAGNOSIS — Z862 Personal history of diseases of the blood and blood-forming organs and certain disorders involving the immune mechanism: Secondary | ICD-10-CM | POA: Diagnosis not present

## 2015-02-12 DIAGNOSIS — R52 Pain, unspecified: Secondary | ICD-10-CM

## 2015-02-12 DIAGNOSIS — M25552 Pain in left hip: Secondary | ICD-10-CM | POA: Insufficient documentation

## 2015-02-12 DIAGNOSIS — M25551 Pain in right hip: Secondary | ICD-10-CM | POA: Insufficient documentation

## 2015-02-12 DIAGNOSIS — R0602 Shortness of breath: Secondary | ICD-10-CM | POA: Diagnosis not present

## 2015-02-12 DIAGNOSIS — F17210 Nicotine dependence, cigarettes, uncomplicated: Secondary | ICD-10-CM | POA: Diagnosis not present

## 2015-02-12 DIAGNOSIS — Z3202 Encounter for pregnancy test, result negative: Secondary | ICD-10-CM | POA: Insufficient documentation

## 2015-02-12 DIAGNOSIS — M79604 Pain in right leg: Secondary | ICD-10-CM | POA: Diagnosis not present

## 2015-02-12 DIAGNOSIS — J029 Acute pharyngitis, unspecified: Secondary | ICD-10-CM | POA: Insufficient documentation

## 2015-02-12 DIAGNOSIS — Z79899 Other long term (current) drug therapy: Secondary | ICD-10-CM | POA: Diagnosis not present

## 2015-02-12 DIAGNOSIS — J45901 Unspecified asthma with (acute) exacerbation: Secondary | ICD-10-CM | POA: Insufficient documentation

## 2015-02-12 DIAGNOSIS — F319 Bipolar disorder, unspecified: Secondary | ICD-10-CM | POA: Diagnosis not present

## 2015-02-12 DIAGNOSIS — M79605 Pain in left leg: Secondary | ICD-10-CM | POA: Diagnosis not present

## 2015-02-12 DIAGNOSIS — R0789 Other chest pain: Secondary | ICD-10-CM | POA: Diagnosis not present

## 2015-02-12 DIAGNOSIS — Z8639 Personal history of other endocrine, nutritional and metabolic disease: Secondary | ICD-10-CM | POA: Diagnosis not present

## 2015-02-12 DIAGNOSIS — Z880 Allergy status to penicillin: Secondary | ICD-10-CM | POA: Diagnosis not present

## 2015-02-12 DIAGNOSIS — R42 Dizziness and giddiness: Secondary | ICD-10-CM | POA: Diagnosis not present

## 2015-02-12 DIAGNOSIS — F209 Schizophrenia, unspecified: Secondary | ICD-10-CM | POA: Diagnosis not present

## 2015-02-12 DIAGNOSIS — R072 Precordial pain: Secondary | ICD-10-CM | POA: Diagnosis not present

## 2015-02-12 LAB — BASIC METABOLIC PANEL
Anion gap: 5 (ref 5–15)
BUN: 12 mg/dL (ref 6–20)
CALCIUM: 9.4 mg/dL (ref 8.9–10.3)
CO2: 28 mmol/L (ref 22–32)
CREATININE: 0.78 mg/dL (ref 0.44–1.00)
Chloride: 108 mmol/L (ref 101–111)
GFR calc Af Amer: >60 mL/min (ref >60)
GLUCOSE: 94 mg/dL (ref 65–99)
Potassium: 3.5 mmol/L (ref 3.5–5.1)
SODIUM: 141 mmol/L (ref 135–145)

## 2015-02-12 LAB — URINALYSIS, ROUTINE W REFLEX MICROSCOPIC
Bilirubin Urine: NEGATIVE
GLUCOSE, UA: NEGATIVE mg/dL
KETONES UR: NEGATIVE mg/dL
LEUKOCYTES UA: NEGATIVE
Nitrite: NEGATIVE
PH: 5.5 (ref 5.0–8.0)
Protein, ur: NEGATIVE mg/dL
Specific Gravity, Urine: 1.023 (ref 1.005–1.030)

## 2015-02-12 LAB — CBC
HEMATOCRIT: 38.3 % (ref 36.0–46.0)
Hemoglobin: 12.4 g/dL (ref 12.0–15.0)
MCH: 31.8 pg (ref 26.0–34.0)
MCHC: 32.4 g/dL (ref 30.0–36.0)
MCV: 98.2 fL (ref 78.0–100.0)
PLATELETS: 304 10*3/uL (ref 150–400)
RBC: 3.9 MIL/uL (ref 3.87–5.11)
RDW: 12.6 % (ref 11.5–15.5)
WBC: 11.1 10*3/uL — AB (ref 4.0–10.5)

## 2015-02-12 LAB — URINE MICROSCOPIC-ADD ON

## 2015-02-12 LAB — I-STAT TROPONIN, ED: TROPONIN I, POC: 0.01 ng/mL (ref 0.00–0.08)

## 2015-02-12 LAB — POC URINE PREG, ED: Preg Test, Ur: NEGATIVE

## 2015-02-12 LAB — RAPID STREP SCREEN (MED CTR MEBANE ONLY): Streptococcus, Group A Screen (Direct): NEGATIVE

## 2015-02-12 MED ORDER — SODIUM CHLORIDE 0.9 % IV BOLUS (SEPSIS)
1000.0000 mL | Freq: Once | INTRAVENOUS | Status: AC
  Administered 2015-02-12: 1000 mL via INTRAVENOUS

## 2015-02-12 MED ORDER — ALBUTEROL SULFATE HFA 108 (90 BASE) MCG/ACT IN AERS: 2.0000 | INHALATION_SPRAY | RESPIRATORY_TRACT | Status: DC | PRN

## 2015-02-12 MED ORDER — IPRATROPIUM BROMIDE 0.02 % IN SOLN
0.5000 mg | Freq: Once | RESPIRATORY_TRACT | Status: AC
  Administered 2015-02-12: 0.5 mg via RESPIRATORY_TRACT
  Filled 2015-02-12: qty 2.5

## 2015-02-12 MED ORDER — ALBUTEROL SULFATE (2.5 MG/3ML) 0.083% IN NEBU
5.0000 mg | INHALATION_SOLUTION | Freq: Once | RESPIRATORY_TRACT | Status: AC
  Administered 2015-02-12: 5 mg via RESPIRATORY_TRACT
  Filled 2015-02-12: qty 6

## 2015-02-12 MED ORDER — ACETAMINOPHEN 325 MG PO TABS
650.0000 mg | ORAL_TABLET | Freq: Once | ORAL | Status: AC
  Administered 2015-02-12: 650 mg via ORAL
  Filled 2015-02-12: qty 2

## 2015-02-12 MED ORDER — ALBUTEROL SULFATE HFA 108 (90 BASE) MCG/ACT IN AERS
2.0000 | INHALATION_SPRAY | Freq: Once | RESPIRATORY_TRACT | Status: AC
  Administered 2015-02-12: 2 via RESPIRATORY_TRACT
  Filled 2015-02-12: qty 6.7

## 2015-02-12 MED ORDER — ACETAMINOPHEN 500 MG PO TABS: 500.0000 mg | ORAL_TABLET | Freq: Four times a day (QID) | ORAL | Status: DC | PRN

## 2015-02-12 NOTE — ED Notes (Signed)
Patient states she has a history of asthma and feels like she needs a breathing treatment.

## 2015-02-12 NOTE — ED Notes (Signed)
Per pt, states B/L hip and leg pain that started yesterday-CP that started yesterday

## 2015-02-12 NOTE — ED Provider Notes (Signed)
CSN: [id number]     Arrival date & time 02/12/15  1904 History   First MD Initiated Contact with Patient 02/12/15 2006     Chief Complaint  Patient presents with  . Hip Pain   Valerie Lee is a 39 y.o. female with a history of G6PD deficiency, anemia, asthma, schizophrenia and bipolar disorder who presents to the emergency department complaining of substernal chest pain and bilateral hip and leg pain since yesterday. Patient reports she just began working again attributes the pain in her legs to being back at work and being on her feet all day. Patient currently complains of 10 out of 10 substernal chest tightness and stabbing since yesterday. She reports her pain is worse with ambulation and touching. She reports shortness of breath only when talking. She denies palpitations. She denies history of MI. She reports sore throat only when swallowing. Patient reports she has taken nothing for treatment today, and room spinning dizziness. The patient denies personal or close family history of MI. The patient denies fevers, chills, abdominal pain, nausea, vomiting, diarrhea, numbness, tingling, weakness, leg swelling, palpitations, syncope, lightheadedness, headache, urinary symptoms, trouble swallowing or rashes.  (Consider location/radiation/quality/duration/timing/severity/associated sxs/prior Treatment) HPI  Past Medical History  Diagnosis Date  . G6PD (glucose 6 phosphatase deficiency)   . Anemia   . G6PD deficiency anemia ([place])   . Blood transfusion   . Asthma     rarely uses albuterol rescue  . Mental disorder   . Schizophrenia ([place])   . Schizophrenia ([place])   . Bipolar 1 disorder [facility])    Past Surgical History  Procedure Laterality Date  . Tubal ligation    . Cholecystectomy  2011  . Abdominal hysterectomy  05/26/2011    Procedure: HYSTERECTOMY ABDOMINAL;  Surgeon: Mora Bellman, MD;  Location: [facility];  Service: Gynecology;  Laterality: N/A;  . Tubal ligation     Family  History  Problem Relation Age of Onset  . Hypertension Mother    Social History  Substance Use Topics  . Smoking status: Current Every Day Smoker -- 1.50 packs/day for 24 years    Types: Cigarettes  . Smokeless tobacco: Never Used  . Alcohol Use: No   OB History    Gravida Para Term Preterm AB TAB SAB Ectopic Multiple Living   6 6 5 1  0 0 0 0 0 6     Review of Systems  Constitutional: Negative for fever and chills.  HENT: Positive for sore throat. Negative for congestion, ear pain and trouble swallowing.   Eyes: Negative for visual disturbance.  Respiratory: Positive for cough, chest tightness and shortness of breath. Negative for wheezing.   Cardiovascular: Positive for chest pain. Negative for palpitations and leg swelling.  Gastrointestinal: Negative for nausea, vomiting, abdominal pain and diarrhea.  Genitourinary: Negative for dysuria, urgency, frequency, hematuria and difficulty urinating.  Musculoskeletal: Positive for arthralgias. Negative for neck pain and neck stiffness.  Skin: Negative for rash.  Neurological: Positive for dizziness. Negative for syncope, weakness, light-headedness, numbness and headaches.      Allergies  Bactrim; Blue dyes (parenteral); Codeine; Penicillins; Aspirin; and Ibuprofen  Home Medications   Prior to Admission medications   Medication Sig Start Date End Date Taking? Authorizing Provider  ARIPiprazole 400 MG SUSR Inject 400 mg into the muscle every 28 (twenty-eight) days. 12/29/14  Yes Niel Hummer, NP  benztropine (COGENTIN) 1 MG tablet Take 1 tablet (1 mg total) by mouth 2 (two) times daily after a meal. 12/01/14  Yes Niel Hummer, NP  hydrOXYzine (ATARAX/VISTARIL) 25 MG tablet Take 1 tablet (25 mg total) by mouth every 6 (six) hours as needed for anxiety. 12/01/14  Yes Niel Hummer, NP  lamoTRIgine (LAMICTAL) 25 MG tablet Take 1 tablet (25 mg total) by mouth daily. 12/01/14  Yes Niel Hummer, NP  lidocaine (LIDODERM) 5 % Place 1 patch  onto the skin daily. Remove & Discard patch within 12 hours or as directed by MD 12/01/14  Yes Niel Hummer, NP  nicotine polacrilex (NICORETTE) 2 MG gum Take 1 each (2 mg total) by mouth as needed for smoking cessation. 12/01/14  Yes Niel Hummer, NP  traZODone (DESYREL) 50 MG tablet Take 1 tablet (50 mg total) by mouth at bedtime as needed for sleep. 12/01/14  Yes Niel Hummer, NP  acetaminophen (TYLENOL) 500 MG tablet Take 1 tablet (500 mg total) by mouth every 6 (six) hours as needed. 02/12/15   Waynetta Pean, PA-C  albuterol (PROVENTIL HFA;VENTOLIN HFA) 108 (90 BASE) MCG/ACT inhaler Inhale 2 puffs into the lungs every 4 (four) hours as needed for wheezing or shortness of breath. 02/12/15   Waynetta Pean, PA-C  ARIPiprazole (ABILIFY) 10 MG tablet Take 1 tablet (10 mg total) by mouth daily. Patient not taking: Reported on 02/12/2015 12/01/14   Niel Hummer, NP   BP 110/63 mmHg  Pulse 81  Temp(Src) 98.4 F (36.9 C) (Oral)  Resp 21  SpO2 98%  LMP 04/12/2011 Physical Exam  Constitutional: She is oriented to person, place, and time. She appears well-developed and well-nourished. No distress.  Nontoxic appearing.  HENT:  Head: Normocephalic and atraumatic.  Right Ear: External ear normal.  Left Ear: External ear normal.  Mouth/Throat: No oropharyngeal exudate.  Mild bilateral tonsillar aperture without exudates. Uvula is midline without edema. No peritonsillar abscess. No drooling.  Eyes: Conjunctivae and EOM are normal. Pupils are equal, round, and reactive to light. Right eye exhibits no discharge. Left eye exhibits no discharge.  Neck: Normal range of motion. Neck supple. No JVD present. No tracheal deviation present.  Cardiovascular: Normal rate, regular rhythm, normal heart sounds and intact distal pulses.  Exam reveals no gallop and no friction rub.   No murmur heard. Bilateral radial, posterior tibialis and dorsalis pedis pulses are intact.    Pulmonary/Chest: Effort normal and breath  sounds normal. No respiratory distress. She has no wheezes. She has no rales. She exhibits tenderness.  Lungs are clear to auscultation bilaterally. Substernal chest wall is tender to palpation and reproduces her chest pain.  Abdominal: Soft. Bowel sounds are normal. She exhibits no distension and no mass. There is no tenderness. There is no guarding.  Abdomen is soft and nontender to palpation.  Musculoskeletal: Normal range of motion. She exhibits tenderness. She exhibits no edema.  Patient is spontaneously moving all extremities in a coordinated fashion exhibiting good strength.  Patient's bilateral lower extremities are tender to palpation with even slightest palpation. No deformities noted to her bilateral lower extremities. No calf edema bilaterally. No rashes or erythema.   Lymphadenopathy:    She has no cervical adenopathy.  Neurological: She is alert and oriented to person, place, and time. Coordination normal.  Sensation is intact in her bilateral upper and lower extremities.  Skin: Skin is warm and dry. No rash noted. She is not diaphoretic. No erythema. No pallor.  Psychiatric: She has a normal mood and affect. Her behavior is normal.  Nursing note and vitals reviewed.   ED  Course  Procedures (including critical care time) Labs Review Labs Reviewed  CBC - Abnormal; Notable for the following:    WBC 11.1 (*)    All other components within normal limits  URINALYSIS, ROUTINE W REFLEX MICROSCOPIC (NOT AT [facility]) - Abnormal; Notable for the following:    APPearance CLOUDY (*)    Hgb urine dipstick SMALL (*)    All other components within normal limits  URINE MICROSCOPIC-ADD ON - Abnormal; Notable for the following:    Squamous Epithelial / LPF TOO NUMEROUS TO COUNT (*)    Bacteria, UA MANY (*)    All other components within normal limits  RAPID STREP SCREEN (NOT AT [facility])  CULTURE, GROUP A STREP  URINE CULTURE  BASIC METABOLIC PANEL  I-STAT TROPOININ, ED  POC URINE PREG, ED     Imaging Review Dg Chest 2 View  02/12/2015  CLINICAL DATA:  Chest pain and body aches, onset yesterday. Shortness of breath. EXAM: CHEST  2 VIEW COMPARISON:  11/23/2014 FINDINGS: The cardiomediastinal contours are unchanged, heart size accentuated by technique. Pulmonary vasculature is normal. No consolidation, pleural effusion, or pneumothorax. No acute osseous abnormalities are seen. IMPRESSION: No acute pulmonary process. Electronically Signed   By: Jeb Levering M.D.   On: 02/12/2015 21:16   I have personally reviewed and evaluated these images and lab results as part of my medical decision-making.   EKG Interpretation   Date/Time:  Thursday February 12 2015 19:18:12 EST Ventricular Rate:  69 PR Interval:  145 QRS Duration: 74 QT Interval:  375 QTC Calculation: 402 R Axis:   68 Text Interpretation:  Sinus rhythm Low voltage, precordial leads No  significant change since last tracing Confirmed by [facility]  MD, WHITNEY  [phone]) on 02/12/2015 10:01:32 PM      Filed Vitals:   02/12/15 1916 02/12/15 1958 02/12/15 2030 02/12/15 2100  BP: 99/54 104/71 115/72 110/63  Pulse: 81 75 71 81  Temp: 98.4 F (36.9 C)     TempSrc: Oral     Resp: 18 16 21 21   SpO2: 97% 97% 98% 98%     MDM   Meds given in ED:  Medications  albuterol (PROVENTIL HFA;VENTOLIN HFA) 108 (90 BASE) MCG/ACT inhaler 2 puff (not administered)  sodium chloride 0.9 % bolus 1,000 mL (0 mLs Intravenous Stopped 02/12/15 2157)  albuterol (PROVENTIL) (2.5 MG/3ML) 0.083% nebulizer solution 5 mg (5 mg Nebulization Given 02/12/15 2040)  ipratropium (ATROVENT) nebulizer solution 0.5 mg (0.5 mg Nebulization Given 02/12/15 2040)  acetaminophen (TYLENOL) tablet 650 mg (650 mg Oral Given 02/12/15 2040)    New Prescriptions   ACETAMINOPHEN (TYLENOL) 500 MG TABLET    Take 1 tablet (500 mg total) by mouth every 6 (six) hours as needed.   ALBUTEROL (PROVENTIL HFA;VENTOLIN HFA) 108 (90 BASE) MCG/ACT INHALER    Inhale 2  puffs into the lungs every 4 (four) hours as needed for wheezing or shortness of breath.    Final diagnoses:  Body aches  Chest tightness   This is a 39 y.o. female with a history of G6PD deficiency, anemia, asthma, schizophrenia and bipolar disorder who presents to the emergency department complaining of substernal chest pain and bilateral hip and leg pain since yesterday. Patient reports she just began working again attributes the pain in her legs to being back at work and being on her feet all day. Patient currently complains of 10 out of 10 substernal chest tightness and stabbing since yesterday. She reports her pain is worse with ambulation  and touching. She reports shortness of breath only when talking.  On exam the patient is afebrile and nontoxic appearing. Her lungs are clear to auscultation bilaterally. Her substernal chest wall is tender palpation which reproduces her chest pain. Her bilateral lower extremities are tender to palpation with slight touch. No lower extremity edema, deformity, ecchymosis or erythema noted. She has mild bilateral tonsillar hypertrophy without exudates. EKG shows no significant change from her last tracing. Patient's rapid strep is negative. She is a negative urine pregnancy test. Urinalysis is nitrite and leukocyte negative. Troponin is negative. BMP is unremarkable. CBC is remote quality for white count of 11,000. Chest x-ray is unremarkable. Patient provided with breathing treatment, Tylenol and fluid bolus. At reevaluation patient reports feeling much better and back to normal. She reports her chest pain, SOB and tightness has completely resolved after breathing treatment. She reports the pain in her legs is much better and she is able to ambulate to the bathroom within normal gait. Will discharge with albuterol inhaler prescriptions for Tylenol and albuterol inhaler. I encouraged close follow-up with primary care. I suspect viral syndrome. I advised the patient to  follow-up with their primary care provider this week. I advised the patient to return to the emergency department with new or worsening symptoms or new concerns. The patient verbalized understanding and agreement with plan.      Waynetta Pean, PA-C 02/12/15 [place], MD 02/12/15 [phone]

## 2015-02-12 NOTE — Discharge Instructions (Signed)
Upper Respiratory Infection, Adult Most upper respiratory infections (URIs) are a viral infection of the air passages leading to the lungs. A URI affects the nose, throat, and upper air passages. The most common type of URI is nasopharyngitis and is typically referred to as "the common cold." URIs run their course and usually go away on their own. Most of the time, a URI does not require medical attention, but sometimes a bacterial infection in the upper airways can follow a viral infection. This is called a secondary infection. Sinus and middle ear infections are common types of secondary upper respiratory infections. Bacterial pneumonia can also complicate a URI. A URI can worsen asthma and chronic obstructive pulmonary disease (COPD). Sometimes, these complications can require emergency medical care and may be life threatening.  CAUSES Almost all URIs are caused by viruses. A virus is a type of germ and can spread from one person to another.  RISKS FACTORS You may be at risk for a URI if:   You smoke.   You have chronic heart or lung disease.  You have a weakened defense (immune) system.   You are very young or very old.   You have nasal allergies or asthma.  You work in crowded or poorly ventilated areas.  You work in health care facilities or schools. SIGNS AND SYMPTOMS  Symptoms typically develop 2-3 days after you come in contact with a cold virus. Most viral URIs last 7-10 days. However, viral URIs from the influenza virus (flu virus) can last 14-18 days and are typically more severe. Symptoms may include:   Runny or stuffy (congested) nose.   Sneezing.   Cough.   Sore throat.   Headache.   Fatigue.   Fever.   Loss of appetite.   Pain in your forehead, behind your eyes, and over your cheekbones (sinus pain).  Muscle aches.  DIAGNOSIS  Your health care provider may diagnose a URI by:  Physical exam.  Tests to check that your symptoms are not due to  another condition such as:  Strep throat.  Sinusitis.  Pneumonia.  Asthma. TREATMENT  A URI goes away on its own with time. It cannot be cured with medicines, but medicines may be prescribed or recommended to relieve symptoms. Medicines may help:  Reduce your fever.  Reduce your cough.  Relieve nasal congestion. HOME CARE INSTRUCTIONS   Take medicines only as directed by your health care provider.   Gargle warm saltwater or take cough drops to comfort your throat as directed by your health care provider.  Use a warm mist humidifier or inhale steam from a shower to increase air moisture. This may make it easier to breathe.  Drink enough fluid to keep your urine clear or pale yellow.   Eat soups and other clear broths and maintain good nutrition.   Rest as needed.   Return to work when your temperature has returned to normal or as your health care provider advises. You may need to stay home longer to avoid infecting others. You can also use a face mask and careful hand washing to prevent spread of the virus.  Increase the usage of your inhaler if you have asthma.   Do not use any tobacco products, including cigarettes, chewing tobacco, or electronic cigarettes. If you need help quitting, ask your health care provider. PREVENTION  The best way to protect yourself from getting a cold is to practice good hygiene.   Avoid oral or hand contact with people with cold  symptoms.   Wash your hands often if contact occurs.  There is no clear evidence that vitamin C, vitamin E, echinacea, or exercise reduces the chance of developing a cold. However, it is always recommended to get plenty of rest, exercise, and practice good nutrition.  SEEK MEDICAL CARE IF:   You are getting worse rather than better.   Your symptoms are not controlled by medicine.   You have chills.  You have worsening shortness of breath.  You have brown or red mucus.  You have yellow or brown nasal  discharge.  You have pain in your face, especially when you bend forward.  You have a fever.  You have swollen neck glands.  You have pain while swallowing.  You have white areas in the back of your throat. SEEK IMMEDIATE MEDICAL CARE IF:   You have severe or persistent:  Headache.  Ear pain.  Sinus pain.  Chest pain.  You have chronic lung disease and any of the following:  Wheezing.  Prolonged cough.  Coughing up blood.  A change in your usual mucus.  You have a stiff neck.  You have changes in your:  Vision.  Hearing.  Thinking.  Mood. MAKE SURE YOU:   Understand these instructions.  Will watch your condition.  Will get help right away if you are not doing well or get worse.   This information is not intended to replace advice given to you by your health care provider. Make sure you discuss any questions you have with your health care provider.   Document Released: 09/07/2000 Document Revised: 07/29/2014 Document Reviewed: 06/19/2013 Elsevier Interactive Patient Education 2016 Elsevier Inc[employer]  Chest Wall Pain Chest wall pain is pain in or around the bones and muscles of your chest. Sometimes, an injury causes this pain. Sometimes, the cause may not be known. This pain may take several weeks or longer to get better. HOME CARE INSTRUCTIONS  Pay attention to any changes in your symptoms. Take these actions to help with your pain:   Rest as told by your health care provider.   Avoid activities that cause pain. These include any activities that use your chest muscles or your abdominal and side muscles to lift heavy items.   If directed, apply ice to the painful area:  Put ice in a plastic bag.  Place a towel between your skin and the bag.  Leave the ice on for 20 minutes, 2-3 times per day.  Take over-the-counter and prescription medicines only as told by your health care provider.  Do not use tobacco products, including cigarettes, chewing  tobacco, and e-cigarettes. If you need help quitting, ask your health care provider.  Keep all follow-up visits as told by your health care provider. This is important. SEEK MEDICAL CARE IF:  You have a fever.  Your chest pain becomes worse.  You have new symptoms. SEEK IMMEDIATE MEDICAL CARE IF:  You have nausea or vomiting.  You feel sweaty or light-headed.  You have a cough with phlegm (sputum) or you cough up blood.  You develop shortness of breath.   This information is not intended to replace advice given to you by your health care provider. Make sure you discuss any questions you have with your health care provider.   Document Released: 03/14/2005 Document Revised: 12/03/2014 Document Reviewed: 06/09/2014 Elsevier Interactive Patient Education 2016 [employer]. Asthma, Adult Asthma is a recurring condition in which the airways tighten and narrow. Asthma can make it difficult to breathe.  It can cause coughing, wheezing, and shortness of breath. Asthma episodes, also called asthma attacks, range from minor to life-threatening. Asthma cannot be cured, but medicines and lifestyle changes can help control it. CAUSES Asthma is believed to be caused by inherited (genetic) and environmental factors, but its exact cause is unknown. Asthma may be triggered by allergens, lung infections, or irritants in the air. Asthma triggers are different for each person. Common triggers include:   Animal dander.  Dust mites.  Cockroaches.  Pollen from trees or grass.  Mold.  Smoke.  Air pollutants such as dust, household cleaners, hair sprays, aerosol sprays, paint fumes, strong chemicals, or strong odors.  Cold air, weather changes, and winds (which increase molds and pollens in the air).  Strong emotional expressions such as crying or laughing hard.  Stress.  Certain medicines (such as aspirin) or types of drugs (such as beta-blockers).  Sulfites in foods and drinks. Foods and  drinks that may contain sulfites include dried fruit, potato chips, and sparkling grape juice.  Infections or inflammatory conditions such as the flu, a cold, or an inflammation of the nasal membranes (rhinitis).  Gastroesophageal reflux disease (GERD).  Exercise or strenuous activity. SYMPTOMS Symptoms may occur immediately after asthma is triggered or many hours later. Symptoms include:  Wheezing.  Excessive nighttime or early morning coughing.  Frequent or severe coughing with a common cold.  Chest tightness.  Shortness of breath. DIAGNOSIS  The diagnosis of asthma is made by a review of your medical history and a physical exam. Tests may also be performed. These may include:  Lung function studies. These tests show how much air you breathe in and out.  Allergy tests.  Imaging tests such as X-rays. TREATMENT  Asthma cannot be cured, but it can usually be controlled. Treatment involves identifying and avoiding your asthma triggers. It also involves medicines. There are 2 classes of medicine used for asthma treatment:   Controller medicines. These prevent asthma symptoms from occurring. They are usually taken every day.  Reliever or rescue medicines. These quickly relieve asthma symptoms. They are used as needed and provide short-term relief. Your health care provider will help you create an asthma action plan. An asthma action plan is a written plan for managing and treating your asthma attacks. It includes a list of your asthma triggers and how they may be avoided. It also includes information on when medicines should be taken and when their dosage should be changed. An action plan may also involve the use of a device called a peak flow meter. A peak flow meter measures how well the lungs are working. It helps you monitor your condition. HOME CARE INSTRUCTIONS   Take medicines only as directed by your health care provider. Speak with your health care provider if you have  questions about how or when to take the medicines.  Use a peak flow meter as directed by your health care provider. Record and keep track of readings.  Understand and use the action plan to help minimize or stop an asthma attack without needing to seek medical care.  Control your home environment in the following ways to help prevent asthma attacks:  Do not smoke. Avoid being exposed to secondhand smoke.  Change your heating and air conditioning filter regularly.  Limit your use of fireplaces and wood stoves.  Get rid of pests (such as roaches and mice) and their droppings.  Throw away plants if you see mold on them.  Clean your floors and  dust regularly. Use unscented cleaning products.  Try to have someone else vacuum for you regularly. Stay out of rooms while they are being vacuumed and for a short while afterward. If you vacuum, use a dust mask from a hardware store, a double-layered or microfilter vacuum cleaner bag, or a vacuum cleaner with a HEPA filter.  Replace carpet with wood, tile, or vinyl flooring. Carpet can trap dander and dust.  Use allergy-proof pillows, mattress covers, and box spring covers.  Wash bed sheets and blankets every week in hot water and dry them in a dryer.  Use blankets that are made of polyester or cotton.  Clean bathrooms and kitchens with bleach. If possible, have someone repaint the walls in these rooms with mold-resistant paint. Keep out of the rooms that are being cleaned and painted.  Wash hands frequently. SEEK MEDICAL CARE IF:   You have wheezing, shortness of breath, or a cough even if taking medicine to prevent attacks.  The colored mucus you cough up (sputum) is thicker than usual.  Your sputum changes from clear or white to yellow, green, gray, or bloody.  You have any problems that may be related to the medicines you are taking (such as a rash, itching, swelling, or trouble breathing).  You are using a reliever medicine more  than 2-3 times per week.  Your peak flow is still at 50-79% of your personal best after following your action plan for 1 hour.  You have a fever. SEEK IMMEDIATE MEDICAL CARE IF:   You seem to be getting worse and are unresponsive to treatment during an asthma attack.  You are short of breath even at rest.  You get short of breath when doing very little physical activity.  You have difficulty eating, drinking, or talking due to asthma symptoms.  You develop chest pain.  You develop a fast heartbeat.  You have a bluish color to your lips or fingernails.  You are light-headed, dizzy, or faint.  Your peak flow is less than 50% of your personal best.   This information is not intended to replace advice given to you by your health care provider. Make sure you discuss any questions you have with your health care provider.   Document Released: 03/14/2005 Document Revised: 12/03/2014 Document Reviewed: 10/11/2012 Elsevier Interactive Patient Education [employer].

## 2015-02-14 LAB — URINE CULTURE

## 2015-02-16 LAB — CULTURE, GROUP A STREP

## 2016-08-13 DIAGNOSIS — R1031 Right lower quadrant pain: Secondary | ICD-10-CM | POA: Diagnosis not present

## 2016-08-13 DIAGNOSIS — R8271 Bacteriuria: Secondary | ICD-10-CM | POA: Diagnosis not present

## 2016-08-13 DIAGNOSIS — M7551 Bursitis of right shoulder: Secondary | ICD-10-CM | POA: Diagnosis not present

## 2016-10-04 ENCOUNTER — Inpatient Hospital Stay (HOSPITAL_COMMUNITY)
Admission: AD | Admit: 2016-10-04 | Discharge: 2016-10-07 | DRG: 885 | Disposition: A | Discharge status: Home / Self Care | Payer: Medicare HMO | Source: Intra-hospital | Attending: Psychiatry | Admitting: Psychiatry

## 2016-10-04 ENCOUNTER — Encounter (HOSPITAL_COMMUNITY): Payer: Self-pay

## 2016-10-04 ENCOUNTER — Emergency Department (HOSPITAL_COMMUNITY)
Admission: EM | Admit: 2016-10-04 | Discharge: 2016-10-04 | Disposition: A | Discharge status: Other Acute Inpatient Hospital | Payer: Medicare HMO | Attending: Emergency Medicine | Admitting: Emergency Medicine

## 2016-10-04 DIAGNOSIS — J45909 Unspecified asthma, uncomplicated: Secondary | ICD-10-CM | POA: Insufficient documentation

## 2016-10-04 DIAGNOSIS — S91331A Puncture wound without foreign body, right foot, initial encounter: Secondary | ICD-10-CM | POA: Diagnosis not present

## 2016-10-04 DIAGNOSIS — Z880 Allergy status to penicillin: Secondary | ICD-10-CM | POA: Diagnosis not present

## 2016-10-04 DIAGNOSIS — F1210 Cannabis abuse, uncomplicated: Secondary | ICD-10-CM

## 2016-10-04 DIAGNOSIS — Z882 Allergy status to sulfonamides status: Secondary | ICD-10-CM | POA: Diagnosis not present

## 2016-10-04 DIAGNOSIS — F250 Schizoaffective disorder, bipolar type: Secondary | ICD-10-CM | POA: Diagnosis not present

## 2016-10-04 DIAGNOSIS — Z23 Encounter for immunization: Secondary | ICD-10-CM | POA: Diagnosis not present

## 2016-10-04 DIAGNOSIS — W450XXA Nail entering through skin, initial encounter: Secondary | ICD-10-CM | POA: Insufficient documentation

## 2016-10-04 DIAGNOSIS — S91339A Puncture wound without foreign body, unspecified foot, initial encounter: Secondary | ICD-10-CM | POA: Diagnosis not present

## 2016-10-04 DIAGNOSIS — Y999 Unspecified external cause status: Secondary | ICD-10-CM | POA: Diagnosis not present

## 2016-10-04 DIAGNOSIS — F22 Delusional disorders: Secondary | ICD-10-CM | POA: Diagnosis not present

## 2016-10-04 DIAGNOSIS — Z886 Allergy status to analgesic agent status: Secondary | ICD-10-CM

## 2016-10-04 DIAGNOSIS — S91332A Puncture wound without foreign body, left foot, initial encounter: Secondary | ICD-10-CM | POA: Insufficient documentation

## 2016-10-04 DIAGNOSIS — Z9114 Patient's other noncompliance with medication regimen: Secondary | ICD-10-CM

## 2016-10-04 DIAGNOSIS — R45850 Homicidal ideations: Secondary | ICD-10-CM | POA: Diagnosis not present

## 2016-10-04 DIAGNOSIS — F200 Paranoid schizophrenia: Secondary | ICD-10-CM | POA: Insufficient documentation

## 2016-10-04 DIAGNOSIS — D550 Anemia due to glucose-6-phosphate dehydrogenase [G6PD] deficiency: Secondary | ICD-10-CM | POA: Diagnosis present

## 2016-10-04 DIAGNOSIS — F1290 Cannabis use, unspecified, uncomplicated: Secondary | ICD-10-CM | POA: Diagnosis not present

## 2016-10-04 DIAGNOSIS — N390 Urinary tract infection, site not specified: Secondary | ICD-10-CM | POA: Diagnosis not present

## 2016-10-04 DIAGNOSIS — Z9049 Acquired absence of other specified parts of digestive tract: Secondary | ICD-10-CM

## 2016-10-04 DIAGNOSIS — Y929 Unspecified place or not applicable: Secondary | ICD-10-CM | POA: Diagnosis not present

## 2016-10-04 DIAGNOSIS — Z885 Allergy status to narcotic agent status: Secondary | ICD-10-CM

## 2016-10-04 DIAGNOSIS — Y939 Activity, unspecified: Secondary | ICD-10-CM | POA: Diagnosis not present

## 2016-10-04 DIAGNOSIS — R440 Auditory hallucinations: Secondary | ICD-10-CM | POA: Diagnosis present

## 2016-10-04 DIAGNOSIS — Z90710 Acquired absence of both cervix and uterus: Secondary | ICD-10-CM

## 2016-10-04 DIAGNOSIS — R45851 Suicidal ideations: Secondary | ICD-10-CM | POA: Diagnosis not present

## 2016-10-04 DIAGNOSIS — Z87891 Personal history of nicotine dependence: Secondary | ICD-10-CM

## 2016-10-04 DIAGNOSIS — Z79899 Other long term (current) drug therapy: Secondary | ICD-10-CM | POA: Diagnosis not present

## 2016-10-04 DIAGNOSIS — Z91048 Other nonmedicinal substance allergy status: Secondary | ICD-10-CM | POA: Diagnosis not present

## 2016-10-04 DIAGNOSIS — F419 Anxiety disorder, unspecified: Secondary | ICD-10-CM | POA: Diagnosis present

## 2016-10-04 DIAGNOSIS — F1990 Other psychoactive substance use, unspecified, uncomplicated: Secondary | ICD-10-CM | POA: Diagnosis not present

## 2016-10-04 DIAGNOSIS — R443 Hallucinations, unspecified: Secondary | ICD-10-CM | POA: Diagnosis not present

## 2016-10-04 HISTORY — DX: Delusional disorders: F22

## 2016-10-04 HISTORY — DX: Anxiety disorder, unspecified: F41.9

## 2016-10-04 LAB — CBC
HEMATOCRIT: 38.1 % (ref 36.0–46.0)
Hemoglobin: 13.3 g/dL (ref 12.0–15.0)
MCH: 32.4 pg (ref 26.0–34.0)
MCHC: 34.9 g/dL (ref 30.0–36.0)
MCV: 92.9 fL (ref 78.0–100.0)
PLATELETS: 271 10*3/uL (ref 150–400)
RBC: 4.1 MIL/uL (ref 3.87–5.11)
RDW: 12.6 % (ref 11.5–15.5)
WBC: 12.4 10*3/uL — ABNORMAL HIGH (ref 4.0–10.5)

## 2016-10-04 LAB — ACETAMINOPHEN LEVEL: Acetaminophen (Tylenol), Serum: <10 ug/mL — ABNORMAL LOW (ref 10–30)

## 2016-10-04 LAB — COMPREHENSIVE METABOLIC PANEL
ALK PHOS: 53 U/L (ref 38–126)
ALT: 13 U/L — AB (ref 14–54)
AST: 20 U/L (ref 15–41)
Albumin: 4.4 g/dL (ref 3.5–5.0)
Anion gap: 8 (ref 5–15)
BILIRUBIN TOTAL: 0.9 mg/dL (ref 0.3–1.2)
BUN: 10 mg/dL (ref 6–20)
CO2: 23 mmol/L (ref 22–32)
CREATININE: 0.71 mg/dL (ref 0.44–1.00)
Calcium: 9.2 mg/dL (ref 8.9–10.3)
Chloride: 107 mmol/L (ref 101–111)
GFR calc Af Amer: >60 mL/min (ref >60)
GLUCOSE: 94 mg/dL (ref 65–99)
Potassium: 3.8 mmol/L (ref 3.5–5.1)
Sodium: 138 mmol/L (ref 135–145)
TOTAL PROTEIN: 8.7 g/dL — AB (ref 6.5–8.1)

## 2016-10-04 LAB — RAPID URINE DRUG SCREEN, HOSP PERFORMED
AMPHETAMINES: NOT DETECTED
BARBITURATES: NOT DETECTED
Benzodiazepines: NOT DETECTED
Cocaine: NOT DETECTED
Opiates: NOT DETECTED
Tetrahydrocannabinol: POSITIVE — AB

## 2016-10-04 LAB — PREGNANCY, URINE: PREG TEST UR: NEGATIVE

## 2016-10-04 LAB — ETHANOL

## 2016-10-04 LAB — SALICYLATE LEVEL: Salicylate Lvl: <7 mg/dL (ref 2.8–30.0)

## 2016-10-04 MED ORDER — TRAZODONE HCL 50 MG PO TABS
50.0000 mg | ORAL_TABLET | Freq: Every evening | ORAL | Status: DC | PRN
  Administered 2016-10-04 – 2016-10-06 (×3): 50 mg via ORAL
  Filled 2016-10-04: qty 14
  Filled 2016-10-04 (×3): qty 1

## 2016-10-04 MED ORDER — LAMOTRIGINE 25 MG PO TABS
25.0000 mg | ORAL_TABLET | Freq: Every day | ORAL | Status: DC
  Administered 2016-10-05 – 2016-10-07 (×3): 25 mg via ORAL
  Filled 2016-10-04: qty 14
  Filled 2016-10-04 (×5): qty 1

## 2016-10-04 MED ORDER — ACETAMINOPHEN 325 MG PO TABS
650.0000 mg | ORAL_TABLET | Freq: Once | ORAL | Status: AC
  Administered 2016-10-04: 650 mg via ORAL
  Filled 2016-10-04: qty 2

## 2016-10-04 MED ORDER — MAGNESIUM HYDROXIDE 400 MG/5ML PO SUSP
30.0000 mL | Freq: Every day | ORAL | Status: DC | PRN
  Administered 2016-10-06: 30 mL via ORAL
  Filled 2016-10-04: qty 30

## 2016-10-04 MED ORDER — ACETAMINOPHEN 325 MG PO TABS
650.0000 mg | ORAL_TABLET | Freq: Four times a day (QID) | ORAL | Status: DC | PRN
  Administered 2016-10-05 – 2016-10-06 (×4): 650 mg via ORAL
  Filled 2016-10-04 (×3): qty 2

## 2016-10-04 MED ORDER — ARIPIPRAZOLE 5 MG PO TABS: 5.0000 mg | ORAL_TABLET | Freq: Every day | ORAL | Status: DC

## 2016-10-04 MED ORDER — TRAZODONE HCL 50 MG PO TABS: 50.0000 mg | ORAL_TABLET | Freq: Every evening | ORAL | Status: DC | PRN

## 2016-10-04 MED ORDER — LAMOTRIGINE 25 MG PO TABS: 25.0000 mg | ORAL_TABLET | Freq: Every day | ORAL | Status: DC

## 2016-10-04 MED ORDER — TETANUS-DIPHTH-ACELL PERTUSSIS 5-2.5-18.5 LF-MCG/0.5 IM SUSP
0.5000 mL | Freq: Once | INTRAMUSCULAR | Status: AC
  Administered 2016-10-04: 0.5 mL via INTRAMUSCULAR
  Filled 2016-10-04: qty 0.5

## 2016-10-04 MED ORDER — ALUM & MAG HYDROXIDE-SIMETH 200-200-20 MG/5ML PO SUSP: 30.0000 mL | Freq: Four times a day (QID) | ORAL | Status: DC | PRN

## 2016-10-04 MED ORDER — ARIPIPRAZOLE 5 MG PO TABS
5.0000 mg | ORAL_TABLET | Freq: Every day | ORAL | Status: DC
  Administered 2016-10-04 – 2016-10-05 (×2): 5 mg via ORAL
  Filled 2016-10-04 (×4): qty 1

## 2016-10-04 MED ORDER — ONDANSETRON HCL 4 MG PO TABS: 4.0000 mg | ORAL_TABLET | Freq: Three times a day (TID) | ORAL | Status: DC | PRN

## 2016-10-04 NOTE — ED Provider Notes (Signed)
[facility] DEPT Provider Note   CSN: [id number] Arrival date & time: 10/04/16  0847     History   Chief Complaint Chief Complaint  Patient presents with  . Medical Clearance  . Foot Injury  . Suicidal  . Homicidal    HPI Valerie Lee is a 41 y.o. female.  HPI Patient presents for medical clearance. Been hearing voices are telling her to hurt herself and other people. Suicidal and homicidal. She is tearful on my exam. She is very nervous about having it around. Only comforted with having one of the ER techs in the room. When she was walking by me in the hall she jumped to the side hole cover to soak in the blanket. She's been off her medication. Denies substance abuse. Also around a week ago stepped on some rusty nails. Has had pain on both of her feet. Worse on the right foot worse with walking. No fevers. Past Medical History:  Diagnosis Date  . Anemia   . Anxiety   . Asthma    rarely uses albuterol rescue  . Bipolar 1 disorder ([place])   . Blood transfusion   . G6PD (glucose 6 phosphatase deficiency)   . 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 Mental disorder   . Paranoia ([place])   . Schizophrenia ([place])   . Schizophrenia [facility])     Patient Active Problem List   Diagnosis Date Noted  . Hyperprolactinemia ([place]) 11/28/2014  . Schizoaffective disorder, depressive type ([place]) 11/24/2014  . Fibroid uterus 05/12/2011  . Pelvic pain 05/12/2011    Past Surgical History:  Procedure Laterality Date  . ABDOMINAL HYSTERECTOMY  05/26/2011   Procedure: HYSTERECTOMY ABDOMINAL;  Surgeon: Mora Bellman, MD;  Location: [facility];  Service: Gynecology;  Laterality: N/A;  . CHOLECYSTECTOMY  2011  . TUBAL LIGATION    . TUBAL LIGATION      OB History    Gravida Para Term Preterm AB Living   6 6 5 1  0 6   SAB TAB Ectopic Multiple Live Births   0 0 0 0         Home Medications    Prior to Admission medications   Medication Sig Start Date End Date Taking? Authorizing Provider    albuterol (PROVENTIL HFA;VENTOLIN HFA) 108 (90 BASE) MCG/ACT inhaler Inhale 2 puffs into the lungs every 4 (four) hours as needed for wheezing or shortness of breath. 02/12/15  Yes Waynetta Pean, PA-C  IRON PO Take 1 tablet by mouth daily.   Yes [provider]  Omega-3 Fatty Acids (FISH OIL PO) Take 1 capsule by mouth daily.   Yes [provider]  ARIPiprazole (ABILIFY) 10 MG tablet Take 1 tablet (10 mg total) by mouth daily. Patient not taking: Reported on 02/12/2015 12/01/14   Niel Hummer, NP  ARIPiprazole 400 MG SUSR Inject 400 mg into the muscle every 28 (twenty-eight) days. Patient not taking: Reported on 10/04/2016 12/29/14   Niel Hummer, NP  benztropine (COGENTIN) 1 MG tablet Take 1 tablet (1 mg total) by mouth 2 (two) times daily after a meal. Patient not taking: Reported on 10/04/2016 12/01/14   Niel Hummer, NP  hydrOXYzine (ATARAX/VISTARIL) 25 MG tablet Take 1 tablet (25 mg total) by mouth every 6 (six) hours as needed for anxiety. Patient not taking: Reported on 10/04/2016 12/01/14   Niel Hummer, NP  lamoTRIgine (LAMICTAL) 25 MG tablet Take 1 tablet (25 mg total) by mouth daily. Patient not taking: Reported on 10/04/2016  12/01/14   Niel Hummer, NP  traZODone (DESYREL) 50 MG tablet Take 1 tablet (50 mg total) by mouth at bedtime as needed for sleep. Patient not taking: Reported on 10/04/2016 12/01/14   Niel Hummer, NP    Family History Family History  Problem Relation Age of Onset  . Hypertension Mother     Social History Social History  Substance Use Topics  . Smoking status: Former Smoker    Packs/day: 1.50    Years: 24.00    Types: Cigarettes  . Smokeless tobacco: Never Used  . Alcohol use No     Allergies   Bactrim; Blue dyes (parenteral); Codeine; Penicillins; Aspirin; and Ibuprofen   Review of Systems Review of Systems  Constitutional: Negative for appetite change and fever.  HENT: Negative for congestion.   Cardiovascular: Negative  for chest pain.  Gastrointestinal: Negative for abdominal pain.  Endocrine: Negative for polyuria.  Genitourinary: Negative for flank pain.  Skin: Positive for wound.  Neurological: Negative for tremors.  Psychiatric/Behavioral: Positive for dysphoric mood, hallucinations and suicidal ideas.     Physical Exam Updated Vital Signs Ht 5\' 1"  (1.549 m)   Wt 68 kg (150 lb)   LMP 04/12/2011   BMI 28.34 kg/m   Physical Exam  Constitutional: She appears well-developed.  HENT:  Head: Normocephalic.  Eyes: EOM are normal.  Cardiovascular: Normal rate.   Pulmonary/Chest: No respiratory distress.  Abdominal: Soft.  Musculoskeletal: She exhibits no edema.  One puncture wound to plantar aspect of foot on each foot. No surrounding erythema. Slight tenderness. No swelling.  Neurological: She is alert.  Skin: Skin is warm.  Psychiatric:  Patient is very anxious and nervous about the examination and is comforted by having attack in the room with her. Patient is tearful.     ED Treatments / Results  Labs (all labs ordered are listed, but only abnormal results are displayed) Labs Reviewed  COMPREHENSIVE METABOLIC PANEL - Abnormal; Notable for the following:       Result Value   Total Protein 8.7 (*)    ALT 13 (*)    All other components within normal limits  ACETAMINOPHEN LEVEL - Abnormal; Notable for the following:    Acetaminophen (Tylenol), Serum <10 (*)    All other components within normal limits  CBC - Abnormal; Notable for the following:    WBC 12.4 (*)    All other components within normal limits  RAPID URINE DRUG SCREEN, HOSP PERFORMED - Abnormal; Notable for the following:    Tetrahydrocannabinol POSITIVE (*)    All other components within normal limits  ETHANOL  SALICYLATE LEVEL    EKG  EKG Interpretation None       Radiology No results found.  Procedures Procedures (including critical care time)  Medications Ordered in ED Medications  Tdap (BOOSTRIX)  injection 0.5 mL (not administered)     Initial Impression / Assessment and Plan / ED Course  I have reviewed the triage vital signs and the nursing notes.  Pertinent labs & imaging results that were available during my care of the patient were reviewed by me and considered in my medical decision making (see chart for details).     Patient presents with hallucinations suicidal or homicidal thoughts. Appears severe and has been off her medications. Also puncture wounds were feet. They do not appear to need any treatment at this time. Tetanus updated. Medically cleared and will be seen by TTS.  Final Clinical Impressions(s) / ED Diagnoses  Final diagnoses:  Paranoid schizophrenia ([place])  Puncture wound of foot, unspecified laterality, initial encounter    New Prescriptions New Prescriptions   No medications on file     Davonna Belling, MD 10/04/16 1131

## 2016-10-04 NOTE — BH Assessment (Signed)
[facility] Assessment Progress Note  Per Corena Pilgrim, MD, this pt requires psychiatric hospitalization at this time.  Letitia Libra, RN, [facility] has assigned pt to [facility] Rm [id number].  Marinus Maw, TS has had pt sign consent forms, and notified pt's nurse, Diane.  Please send original paperwork along with pt via [place], and call report to [phone].  Jalene Mullet, [place] Triage Specialist [phone]

## 2016-10-04 NOTE — ED Notes (Signed)
Belongings sent home with pts minister. Pt has bible at bedside. Bible cover in locker number 40.

## 2016-10-04 NOTE — ED Notes (Signed)
Patients care was transferred to [facility] [place] in [place]. Patient has been wanded and escorted back to [facility]. No belongings transported due to patient reports someone took her belongings out of facility. Jinny Blossom, NT reports a Architectural technologist took her belongings.

## 2016-10-04 NOTE — BH Assessment (Signed)
Tele Assessment Note   Valerie Lee is an 41 y.o. female presenting to [facility] with reports of visual and auditory hallucinations with commands to hurt herself and others. The patient has a history of paranoid schizophrenia, non-med compliant and inpatient hospitalization. The patient reports being off her medications for a few years. She states increased intensity in hallucinations over the last few weeks and days. Indicated thoughts to cut off head or cut wrist in a suicide plan. Wanting the voices to stop. The patient also states recent thoughts to kill her neighbors by pour gasoline on their apartment building. No individual identified. The patient states over 20 suicide attempts in the past. Fears she would act on current SI and HI thoughts.   The patient was admits to [facility] in 2016 and 2014, also admitted to [facility] and [facility] in the past.  Denied current cannabis use but tested positive in UDS. Patient currently lives with her two daughters, 2 yr old and 57 yr old. Works at [employer] 25 to 32 hrs a week.   The patient had unremarkable appearance, fair eye contact, freedom of movement, alert, depressed and anxious mood, impaired judgement and insight.   Catalina Pizza, DNP recommends inpatient psychiatric treatment.   Diagnosis: Paranoid Schizophrenia  Past Medical History:  Past Medical History:  Diagnosis Date  . Anemia   . Anxiety   . Asthma    rarely uses albuterol rescue  . Bipolar 1 disorder ([place])   . Blood transfusion   . G6PD (glucose 6 phosphatase deficiency)   . G6PD deficiency anemia ([place])   . Mental disorder   . Paranoia ([place])   . Schizophrenia ([place])   . Schizophrenia [facility])     Past Surgical History:  Procedure Laterality Date  . ABDOMINAL HYSTERECTOMY  05/26/2011   Procedure: HYSTERECTOMY ABDOMINAL;  Surgeon: Mora Bellman, MD;  Location: [facility];  Service: Gynecology;  Laterality: N/A;  . CHOLECYSTECTOMY  2011  . TUBAL LIGATION    . TUBAL LIGATION       Family History:  Family History  Problem Relation Age of Onset  . Hypertension Mother     Social History:  reports that she has quit smoking. Her smoking use included Cigarettes. She has a 36.00 pack-year smoking history. She has never used smokeless tobacco. She reports that she does not drink alcohol or use drugs.  Additional Social History:  Alcohol / Drug Use Pain Medications: see MAR Prescriptions: see MAR Over the Counter: see MAR History of alcohol / drug use?: Yes Substance #1 Name of Substance 1: cannabis 1 - Age of First Use: unknown  1 - Amount (size/oz): unknown 1 - Frequency: UDS positive for cannabis 1 - Duration: unknown  1 - Last Use / Amount: unknown- reports last use was a few years ago  CIWA:   COWS:    PATIENT STRENGTHS: (choose at least two) Average or above average intelligence General fund of knowledge  Allergies:  Allergies  Allergen Reactions  . Bactrim Anaphylaxis, Itching and Swelling  . Blue Dyes (Parenteral) Anaphylaxis and Shortness Of Breath  . Codeine Itching and Swelling    Hallucinations Pt can take vicodin with no reaction  . Penicillins Anaphylaxis, Itching and Swelling    Has patient had a PCN reaction causing immediate rash, facial/tongue/throat swelling, SOB or lightheadedness with hypotension: Yes Has patient had a PCN reaction causing severe rash involving mucus membranes or skin necrosis: Yes Has patient had a PCN reaction that required hospitalization: Yes  Has patient had a PCN reaction occurring within the last 10 years: No If all of the above answers are "NO", then may proceed with Cephalosporin use.   . Aspirin Swelling  . Ibuprofen Other (See Comments)    Lowers hemoglobin - pt has G6PD deficiency    Home Medications:  (Not in a hospital admission)  OB/GYN Status:  Patient's last menstrual period was 04/12/2011.  General Assessment Data [facility] TTS Assessment: In system Is this a Tele or  Face-to-Face Assessment?: Face-to-Face Is this an Initial Assessment or a Re-assessment for this encounter?: Initial Assessment Marital status: Single Is patient pregnant?: No Pregnancy Status: No Living Arrangements: Children (24 yr old and 46 yr old) Can pt return to current living arrangement?: Yes Admission Status: Voluntary Is patient capable of signing voluntary admission?: Yes Referral Source: Self/Family/Friend Insurance type: [employer] MCR  Medical Screening Exam ([place]) Medical Exam completed: Yes  Crisis Care Plan Living Arrangements: Children (46 yr old and 61 yr old) Name of Psychiatrist: n/a Name of Therapist: n/a  Education Status Is patient currently in school?: No Highest grade of school patient has completed: high school  Risk to self with the past 6 months Suicidal Ideation: Yes-Currently Present Has patient been a risk to self within the past 6 months prior to admission? : No Suicidal Intent: Yes-Currently Present Has patient had any suicidal intent within the past 6 months prior to admission? : No Is patient at risk for suicide?: Yes Suicidal Plan?: Yes-Currently Present Has patient had any suicidal plan within the past 6 months prior to admission? : No Specify Current Suicidal Plan: cut her head off or cut her wrist Access to Means: Yes Specify Access to Suicidal Means: access to sharp objects What has been your use of drugs/alcohol within the last 12 months?: uds+ for cannabis Previous Attempts/Gestures: Yes How many times?: 20 Triggers for Past Attempts: Unknown Intentional Self Injurious Behavior: Cutting, Burning Family Suicide History: Yes Recent stressful life event(s): Other (Comment) (ongoing mental illness, off meds) Persecutory voices/beliefs?: No Depression: Yes Depression Symptoms: Feeling worthless/self pity Substance abuse history and/or treatment for substance abuse?: Yes Suicide prevention information given to non-admitted  patients: Not applicable  Risk to Others within the past 6 months Homicidal Ideation: Yes-Currently Present Does patient have any lifetime risk of violence toward others beyond the six months prior to admission? : No Thoughts of Harm to Others: Yes-Currently Present Comment - Thoughts of Harm to Others: to pour gasoline on her neighbors house and burn them Current Homicidal Intent: Yes-Currently Present Current Homicidal Plan: Yes-Currently Present Describe Current Homicidal Plan: plan to burn her apartment complex Access to Homicidal Means: Yes Describe Access to Homicidal Means: ability to get gasoline Identified Victim: neighbors, no one person identified  History of harm to others?: No Assessment of Violence: None Noted Does patient have access to weapons?: No (no guns) Criminal Charges Pending?: No Does patient have a court date: No Is patient on probation?: No  Psychosis Hallucinations: Auditory, Visual, With command Delusions: None noted  Mental Status Report Appearance/Hygiene: Unremarkable, In scrubs Eye Contact: Good Motor Activity: Freedom of movement Speech: Logical/coherent Level of Consciousness: Alert Mood: Depressed, Anxious Affect: Anxious Anxiety Level: Severe Thought Processes: Coherent Judgement: Impaired Orientation: Person, Place, Time, Situation Obsessive Compulsive Thoughts/Behaviors: None  Cognitive Functioning Concentration: Decreased Memory: Recent Intact, Remote Intact IQ: Average Insight: Poor Impulse Control: Poor Appetite: Good Weight Loss: 0 Weight Gain: 0 Sleep: Decreased Vegetative Symptoms: None  ADLScreening (  [facility] Assessment Services) Patient's cognitive ability adequate to safely complete daily activities?: Yes Patient able to express need for assistance with ADLs?: Yes Independently performs ADLs?: Yes (appropriate for developmental age)  Prior Inpatient Therapy Prior Inpatient Therapy: Yes Prior Therapy Dates: 2016,  2014 Prior Therapy Facilty/Provider(s): [place], [place], [facility] Reason for Treatment: paranoid schizo  Prior Outpatient Therapy Prior Outpatient Therapy: Yes Prior Therapy Dates: not in a few years Prior Therapy Facilty/Provider(s): [facility] Reason for Treatment: schizophrenia Does patient have an ACCT team?: No Does patient have Intensive In-House Services?  : No Does patient have [facility] services? : Yes Does patient have P4[facility] services?: No  ADL Screening (condition at time of admission) Patient's cognitive ability adequate to safely complete daily activities?: Yes Is the patient deaf or have difficulty hearing?: No Does the patient have difficulty seeing, even when wearing glasses/contacts?: No Does the patient have difficulty concentrating, remembering, or making decisions?: No Patient able to express need for assistance with ADLs?: Yes Does the patient have difficulty dressing or bathing?: No Independently performs ADLs?: Yes (appropriate for developmental age)       Abuse/Neglect Assessment (Assessment to be complete while patient is alone) Physical Abuse:  (unknown ) Verbal Abuse:  (unknown ) Sexual Abuse:  (unknown )     Advance Directives (For Healthcare) Does Patient Have a Medical Advance Directive?: No Would patient like information on creating a medical advance directive?: No - Patient declined    Additional Information 1:1 In Past 12 Months?: No CIRT Risk: No Elopement Risk: No Does patient have medical clearance?: Yes     Disposition:  Disposition Initial Assessment Completed for this Encounter: Yes Disposition of Patient: Inpatient treatment program Type of inpatient treatment program: Adult  Mollie Germany 10/04/2016 1:22 PM

## 2016-10-04 NOTE — ED Notes (Signed)
Called back to [facility] to inquire about acceptance to [facility].

## 2016-10-04 NOTE — ED Notes (Signed)
pts minister took belongings with her

## 2016-10-04 NOTE — BH Assessment (Signed)
Valerie Pizza, DNP recommends inpatient psychiatric treatment. TTS to look for placement

## 2016-10-04 NOTE — Progress Notes (Signed)
CSW received a call from [facility] requesting pt's need for a bed.  CSW stated pt still needs placement, per notes.  [facility] requested [employer] RN phone to ask questions.  CSW provided.  Please reconsult if future social work needs arise.    Alphonse Guild. Lynnmarie Lovett, Reed Pandy, CSI Clinical Social Worker Ph: [phone]

## 2016-10-04 NOTE — ED Notes (Signed)
Pt has one visitor pt also has been given [place] sandwhich and apple juice

## 2016-10-04 NOTE — Progress Notes (Signed)
Report received from admitting RN.  Introduced self to pt.  Pt denies SI/HI, denies hallucinations, denies pain.  Medication administered per order.  PRN medication administered for sleep per request.  She verbally contracts for safety and she is safe on the unit.  Will continue to monitor and assess.

## 2016-10-04 NOTE — Progress Notes (Signed)
Valerie Lee called back and said that she had spoken to a female in the ED" who stated pt has been placed.    Alphonse Guild. Shakeia Krus, Reed Pandy, CSI Clinical Social Worker Ph: [phone]

## 2016-10-04 NOTE — ED Notes (Signed)
Ministers number: [phone]
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
## 2016-10-04 NOTE — ED Notes (Signed)
Bed: WTR5 Expected date:  Expected time:  Means of arrival:  Comments: 

## 2016-10-05 ENCOUNTER — Encounter (HOSPITAL_COMMUNITY): Payer: Self-pay

## 2016-10-05 DIAGNOSIS — F1990 Other psychoactive substance use, unspecified, uncomplicated: Secondary | ICD-10-CM

## 2016-10-05 DIAGNOSIS — F22 Delusional disorders: Secondary | ICD-10-CM

## 2016-10-05 DIAGNOSIS — R45850 Homicidal ideations: Secondary | ICD-10-CM

## 2016-10-05 DIAGNOSIS — F1210 Cannabis abuse, uncomplicated: Secondary | ICD-10-CM

## 2016-10-05 DIAGNOSIS — R45851 Suicidal ideations: Secondary | ICD-10-CM

## 2016-10-05 DIAGNOSIS — R443 Hallucinations, unspecified: Secondary | ICD-10-CM

## 2016-10-05 LAB — URINALYSIS, ROUTINE W REFLEX MICROSCOPIC
BACTERIA UA: NONE SEEN
BILIRUBIN URINE: NEGATIVE
Glucose, UA: NEGATIVE mg/dL
KETONES UR: NEGATIVE mg/dL
Nitrite: NEGATIVE
Protein, ur: NEGATIVE mg/dL
SPECIFIC GRAVITY, URINE: 1.026 (ref 1.005–1.030)
pH: 5 (ref 5.0–8.0)

## 2016-10-05 MED ORDER — NITROFURANTOIN MACROCRYSTAL 100 MG PO CAPS: 100.0000 mg | ORAL_CAPSULE | Freq: Two times a day (BID) | ORAL | Status: DC

## 2016-10-05 MED ORDER — NITROFURANTOIN MACROCRYSTAL 100 MG PO CAPS
100.0000 mg | ORAL_CAPSULE | Freq: Two times a day (BID) | ORAL | Status: DC
  Administered 2016-10-05: 100 mg via ORAL
  Filled 2016-10-05 (×2): qty 1
  Filled 2016-10-05: qty 2
  Filled 2016-10-05 (×2): qty 1

## 2016-10-05 NOTE — Plan of Care (Signed)
Problem: Activity: Goal: Interest or engagement in activities will improve Outcome: Progressing Pt is engaged and interacting on the unit.

## 2016-10-05 NOTE — BHH Suicide Risk Assessment (Signed)
[facility] Admission Suicide Risk Assessment   Nursing information obtained from:  Patient Demographic factors:  NA Current Mental Status:  NA Loss Factors:  Loss of significant relationship, Financial problems / change in socioeconomic status (daughter moved away with the grandson) Historical Factors:  Prior suicide attempts, Family history of mental illness or substance abuse Risk Reduction Factors:  Sense of responsibility to family, Living with another person, especially a relative  Total Time spent with patient: 1 hour Principal Problem: Schizoaffective disorder, mixed type ([place]) Diagnosis:   Patient Active Problem List   Diagnosis Date Noted  . Marijuana abuse [F12.10] 10/05/2016    Priority: High  . Schizoaffective disorder, mixed type ([place]) [F25.0] 11/24/2014    Priority: High  . Paranoid ideation ([place]) [F22] 10/04/2016  . 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 Fibroid uterus [D25.9] 05/12/2011  . Pelvic pain [R10.2] 05/12/2011   Subjective Data: Patient is a 41 year old female admitted for artery tree hallucinations along with commands to kill herself and others  Continued Clinical Symptoms:  Alcohol Use Disorder Identification Test Final Score (AUDIT): 0 The "Alcohol Use Disorders Identification Test", Guidelines for Use in Primary Care, Second Edition.  World Pharmacologist [facility]). Score between 0-7:  no or low risk or alcohol related problems. Score between 8-15:  moderate risk of alcohol related problems. Score between 16-19:  high risk of alcohol related problems. Score 20 or above:  warrants further diagnostic evaluation for alcohol dependence and treatment.   CLINICAL FACTORS:   Severe Anxiety and/or Agitation Alcohol/Substance Abuse/Dependencies Schizophrenia:   Command hallucinatons Paranoid or undifferentiated type Previous Psychiatric Diagnoses and Treatments Medical Diagnoses and Treatments/Surgeries   Musculoskeletal: Strength & Muscle Tone:  within normal limits Gait & Station: normal Patient leans: N/A  Psychiatric Specialty Exam: Physical Exam  ROS      COGNITIVE FEATURES THAT CONTRIBUTE TO RISK:  Closed-mindedness and Thought constriction (tunnel vision)    SUICIDE RISK:   Severe:  Frequent, intense, and enduring suicidal ideation, specific plan, no subjective intent, but some objective markers of intent (i.e., choice of lethal method), the method is accessible, some limited preparatory behavior, evidence of impaired self-control, severe dysphoria/symptomatology, multiple risk factors present, and few if any protective factors, particularly a lack of social support.  PLAN OF CARE: Patient started on Abilify and Lamictal.  I certify that inpatient services furnished can reasonably be expected to improve the patient's condition.   Hampton Abbot, MD 10/05/2016, 6:30 PM
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
## 2016-10-05 NOTE — Progress Notes (Signed)
Valerie Lee is a 41 year old female being [facility].  She came to the ED with A/V hallucinations and command voices telling her to hurt herself and others.  She reported not taking prescribed medications for a few years.  She reported thoughts to cut off her head and wrists in a suicide attempt.  She reported multiple suicide attempts in the past.  She also reported homicidal ideation towards her neighbors.  She reported history of blood disorder and asthma.  She is diagnosed with Paranoid Schizophrenia.  During [facility] admission, she denies SI/HI or A/V hallucinations.  She states that she is feeling better because she has started medications again.  She denies any pain or discomfort and appears to be in no physical distress.  Oriented her to the unit.  Admission paperwork completed and signed.  Belongings searched and secured in locker # 17.  Skin assessment completed and no skin issues noted.  Q 15 minute checks initiated for safety.  We will monitor the progress towards her goals.

## 2016-10-05 NOTE — Progress Notes (Signed)
Pt observed sitting in the dayroom watching TV with a blanket over her and sucking her thumb.

## 2016-10-05 NOTE — Progress Notes (Signed)
D:Pt is pleasant on the unit. Pt talked about sadness with her daughter moving to [place] and getting back on her medication. She is interacting and appropriate on the unit.  A:Offered support, encouragement and 15 minute checks. R:Pt denies si and hi. Safety maintained on the unit.

## 2016-10-05 NOTE — Plan of Care (Signed)
Problem: Health Behavior/Discharge Planning: Goal: Compliance with prescribed medication regimen will improve Outcome: Progressing Pt has been compliant with medications tonight.    

## 2016-10-05 NOTE — Progress Notes (Signed)
Recreation Therapy Notes  INPATIENT RECREATION THERAPY ASSESSMENT  Patient Details Name: Valerie Lee MRN: [id number] DOB: 1975-11-12 Today's Date: 10/05/2016  Pt reported she was admitted due to her depression and anxiety. Pt also reported that she was having hallucinations and they were making her want to hurt herself and other people.   Patient Stressors: Family, Death  Pt reported her daughter recently got married and she is her best friend. Pt reported that she felt like she was losing her best friend.  Pt reported her mom passed away two years ago and she was her best friend as well.  Coping Skills:   Isolate, Arguments, Avoidance, Art/Dance - Color, Talking, Music, Prayer  Personal Challenges: Anger, Communication, Expressing Yourself, Problem-Solving, Time Management, Trusting Others  Leisure Interests (2+):  Individual - Reading the Bible, Sports - Basketball, Sports - Football  Awareness of Community Resources:  Yes  Community Resources:  [place]  Current Use: Yes  If no, Barriers?:    Patient Strengths:  good at Development worker, international aid for example helping people at [employer], taking care of others  Patient Identified Areas of Improvement:  "I can be really mean at times"  Current Recreation Participation:  3-4x/ wk  Patient Goal for Hospitalization:  communication and "not flipping out when someone says something I don't like"  [place] of Residence:  [place] of Residence:  [place]   Current [place] (including self-harm):  No  Current HI:  No  Consent to Intern Participation: Yes  Donovan Kail, Recreation Therapy Intern  Victorino Sparrow, LRT/CTRS  Donovan Kail 10/05/2016, 2:17 PM

## 2016-10-05 NOTE — [facility] Suicide Risk Assessment (Signed)
[place] INPATIENT:  Family/Significant Other Suicide Prevention Education  Suicide Prevention Education:  Education Completed; Edward Qualia, Pt's minister [phone], has been identified by the patient as the family member/significant other with whom the patient will be residing, and identified as the person(s) who will aid the patient in the event of a mental health crisis (suicidal ideations/suicide attempt).  With written consent from the patient, the family member/significant other has been provided the following suicide prevention education, prior to the and/or following the discharge of the patient.  The suicide prevention education provided includes the following:  Suicide risk factors  Suicide prevention and interventions  National Suicide Hotline telephone number  [facility] assessment telephone number  [facility] Emergency Assistance [place] and/or Residential Mobile Crisis Unit telephone number  Request made of family/significant other to:  Remove weapons (e.g., guns, rifles, knives), all items previously/currently identified as safety concern.    Remove drugs/medications (over-the-counter, prescriptions, illicit drugs), all items previously/currently identified as a safety concern.  The family member/significant other verbalizes understanding of the suicide prevention education information provided.  The family member/significant other agrees to remove the items of safety concern listed above.  Gladstone Lighter 10/05/2016, 10:52 AM

## 2016-10-05 NOTE — [facility] Group Notes (Signed)
[place] LCSW Group Therapy 10/05/2016 1:15 PM  Type of Therapy: Group Therapy- Emotion Regulation  Participation Level: Active   Participation Quality:  Appropriate  Affect: Appropriate  Cognitive: Alert and Oriented   Insight:  Developing/Improving  Engagement in Therapy: Developing/Improving and Engaged   Modes of Intervention: Clarification, Confrontation, Discussion, Education, Exploration, Limit-setting, Orientation, Problem-solving, Rapport Building, Art therapist, Socialization and Support  Summary of Progress/Problems: The topic for group today was emotional regulation. This group focused on both positive and negative emotion identification and allowed group members to process ways to identify feelings, regulate negative emotions, and find healthy ways to manage internal/external emotions. Group members were asked to reflect on a time when their reaction to an emotion led to a negative outcome and explored how alternative responses using emotion regulation would have benefited them. Group members were also asked to discuss a time when emotion regulation was utilized when a negative emotion was experienced. Pt participated appropriately at the beginning of the group but then fell asleep throughout the rest of the session. Pt did identify feelings of grief surrounding her oldest daughter's recent move to a different state. Pt expressed that this often reminds her of the death of her mother due to the feelings of loss.    Adriana Reams, LCSW 10/05/2016 3:28 PM

## 2016-10-05 NOTE — Tx Team (Signed)
Initial Treatment Plan 10/05/2016 12:22 AM [name] PZW:[id number]    PATIENT STRESSORS: Financial difficulties Loss of daughter and grandson moved to [place] Medication change or noncompliance   PATIENT STRENGTHS: Network engineer for treatment/growth Religious Affiliation   PATIENT IDENTIFIED PROBLEMS: Depression  Suicidal ideation  Psychosis  "not being sad"  "sleep better"             DISCHARGE CRITERIA:  Improved stabilization in mood, thinking, and/or behavior Motivation to continue treatment in a less acute level of care Verbal commitment to aftercare and medication compliance  PRELIMINARY DISCHARGE PLAN: Outpatient therapy Medication management  PATIENT/FAMILY INVOLVEMENT: This treatment plan has been presented to and reviewed with the patient, [name].  The patient and family have been given the opportunity to ask questions and make suggestions.  Windell Moment, RN 10/05/2016, 12:22 AM

## 2016-10-05 NOTE — H&P (Signed)
Psychiatric Admission Assessment Adult  Patient Identification: Valerie Lee MRN:  [id number] Date of Evaluation:  10/05/2016 Chief Complaint:  Schizophrenia Principal Diagnosis: <principal problem not specified> Diagnosis:   Patient Active Problem List   Diagnosis Date Noted  . Paranoid ideation ([place]) [F22] 10/04/2016  . 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 Schizoaffective disorder, depressive type ([place]) [F25.1] 11/24/2014  . Fibroid uterus [D25.9] 05/12/2011  . Pelvic pain [R10.2] 05/12/2011   History of Present Illness: Patient is a 41 year old female transferred from [place] ED for stabilization and treatment of visual in order to hallucinations with commands to hurt herself and others. Patient is diagnosed with paranoid schizophrenic, has been medication noncompliant. Patient reports that she's been off her medications for a few years, adds that the hallucinations have increased in intensity over the last few weeks. She has that she has thoughts to cut off her head her wrist in a suicide plan that she reports that she wants the voices to stop, adds that she has recurrent thoughts to kill her neighbors by pouring gasoline on their apartment building.  Patient is a history of 25 suicide attempts in the past. Patient has had multiple inpatient psychiatric admissions and has a history of noncompliance with medications. [facility].  Patient reports that she uses cannabis, denies any other illicit drug use.  Patient denies any symptoms of depression, any symptoms of mania. Patient states that she works part-time at [employer] and lives with her 2 daughters aged 24 and 1. Associated Signs/Symptoms: Depression Symptoms:  difficulty concentrating, disturbed sleep, (Hypo) Manic Symptoms:  Distractibility, Hallucinations, Impulsivity, Irritable Mood, Labiality of Mood, Anxiety Symptoms:  Obsessive Compulsive  Symptoms:   None,, Psychotic Symptoms:  Delusions, Hallucinations: Command:  Telling her to kill herself and others Paranoia, PTSD Symptoms: NA Total Time spent with patient: 1 hour  Past Psychiatric History: Patient has history of multiple psychiatric hospitalizations with noncompliance with medications  Is the patient at risk to self? Yes.    Has the patient been a risk to self in the past 6 months? Yes.    Has the patient been a risk to self within the distant past? Yes.    Is the patient a risk to others? Yes.    Has the patient been a risk to others in the past 6 months? Yes.    Has the patient been a risk to others within the distant past? Yes.     Prior Inpatient Therapy:   Prior Outpatient Therapy:    Alcohol Screening: 1. How often do you have a drink containing alcohol?: Never 9. Have you or someone else been injured as a result of your drinking?: No 10. Has a relative or friend or a doctor or another health worker been concerned about your drinking or suggested you cut down?: No Alcohol Use Disorder Identification Test Final Score (AUDIT): 0 Brief Intervention: AUDIT score less than 7 or less-screening does not suggest unhealthy drinking-brief intervention not indicated Substance Abuse History in the last 12 months:  Yes.   Consequences of Substance Abuse: NA Previous Psychotropic Medications: Yes  Psychological Evaluations: No  Past Medical History:  Past Medical History:  Diagnosis Date  . Anemia   . Anxiety   . Asthma    rarely uses albuterol rescue  . Bipolar 1 disorder ([place])   . Blood transfusion   . G6PD (glucose 6 phosphatase deficiency)   . G6PD deficiency anemia ([place])   . Mental  disorder   . Paranoia ([place])   . Schizophrenia ([place])   . Schizophrenia [facility])     Past Surgical History:  Procedure Laterality Date  . ABDOMINAL HYSTERECTOMY  05/26/2011   Procedure: HYSTERECTOMY ABDOMINAL;  Surgeon: Mora Bellman, MD;  Location: [facility];  Service: Gynecology;   Laterality: N/A;  . CHOLECYSTECTOMY  2011  . TUBAL LIGATION    . TUBAL LIGATION     Family History:  Family History  Problem Relation Age of Onset  . Hypertension Mother    Family Psychiatric  History: Patient denies any family psychiatric history Tobacco Screening: Have you used any form of tobacco in the last 30 days? (Cigarettes, Smokeless Tobacco, Cigars, and/or Pipes): No Social History:  History  Alcohol Use No     History  Drug Use No    Additional Social History:      Pain Medications: see MAR Prescriptions: see MAR Over the Counter: see MAR History of alcohol / drug use?: Yes Name of Substance 1: cannabis 1 - Age of First Use: unknown  1 - Amount (size/oz): unknown 1 - Frequency: UDS positive for cannabis 1 - Duration: unknown  1 - Last Use / Amount: unknown- reports last use was a few years ago                  Allergies:   Allergies  Allergen Reactions  . Bactrim Anaphylaxis, Itching and Swelling  . Blue Dyes (Parenteral) Anaphylaxis and Shortness Of Breath  . Codeine Itching and Swelling    Hallucinations Pt can take vicodin with no reaction  . Penicillins Anaphylaxis, Itching and Swelling    Has patient had a PCN reaction causing immediate rash, facial/tongue/throat swelling, SOB or lightheadedness with hypotension: Yes Has patient had a PCN reaction causing severe rash involving mucus membranes or skin necrosis: Yes Has patient had a PCN reaction that required hospitalization: Yes Has patient had a PCN reaction occurring within the last 10 years: No If all of the above answers are "NO", then may proceed with Cephalosporin use.   . Aspirin Swelling  . Ibuprofen Other (See Comments)    Lowers hemoglobin - pt has G6PD deficiency   Lab Results:  Results for orders placed or performed during the hospital encounter of 10/04/16 (from the past 48 hour(s))  Comprehensive metabolic panel     Status: Abnormal   Collection Time: 10/04/16  9:53 AM   Result Value Ref Range   Sodium 138 135 - 145 mmol/L   Potassium 3.8 3.5 - 5.1 mmol/L   Chloride 107 101 - 111 mmol/L   CO2 23 22 - 32 mmol/L   Glucose, Bld 94 65 - 99 mg/dL   BUN 10 6 - 20 mg/dL   Creatinine, Ser 0.71 0.44 - 1.00 mg/dL   Calcium 9.2 8.9 - 10.3 mg/dL   Total Protein 8.7 (H) 6.5 - 8.1 g/dL   Albumin 4.4 3.5 - 5.0 g/dL   AST 20 15 - 41 U/L   ALT 13 (L) 14 - 54 U/L   Alkaline Phosphatase 53 38 - 126 U/L   Total Bilirubin 0.9 0.3 - 1.2 mg/dL   GFR calc non Af Amer >60 >60 mL/min   GFR calc Af Amer >60 >60 mL/min    Comment: (NOTE) The eGFR has been calculated using the CKD EPI equation. This calculation has not been validated in all clinical situations. eGFR's persistently <60 mL/min signify possible Chronic Kidney Disease.    Anion gap 8 5 - 15  Ethanol     Status: None   Collection Time: 10/04/16  9:53 AM  Result Value Ref Range   Alcohol, Ethyl (B) <5 <5 mg/dL    Comment:        LOWEST DETECTABLE LIMIT FOR SERUM ALCOHOL IS 5 mg/dL FOR MEDICAL PURPOSES ONLY   Salicylate level     Status: None   Collection Time: 10/04/16  9:53 AM  Result Value Ref Range   Salicylate Lvl <9.6 2.8 - 30.0 mg/dL  Acetaminophen level     Status: Abnormal   Collection Time: 10/04/16  9:53 AM  Result Value Ref Range   Acetaminophen (Tylenol), Serum <10 (L) 10 - 30 ug/mL    Comment:        THERAPEUTIC CONCENTRATIONS VARY SIGNIFICANTLY. A RANGE OF 10-30 ug/mL MAY BE AN EFFECTIVE CONCENTRATION FOR MANY PATIENTS. HOWEVER, SOME ARE BEST TREATED AT CONCENTRATIONS OUTSIDE THIS RANGE. ACETAMINOPHEN CONCENTRATIONS >150 ug/mL AT 4 HOURS AFTER INGESTION AND >50 ug/mL AT 12 HOURS AFTER INGESTION ARE OFTEN ASSOCIATED WITH TOXIC REACTIONS.   cbc     Status: Abnormal   Collection Time: 10/04/16  9:53 AM  Result Value Ref Range   WBC 12.4 (H) 4.0 - 10.5 K/uL   RBC 4.10 3.87 - 5.11 MIL/uL   Hemoglobin 13.3 12.0 - 15.0 g/dL   HCT 38.1 36.0 - 46.0 %   MCV 92.9 78.0 - 100.0 fL    MCH 32.4 26.0 - 34.0 pg   MCHC 34.9 30.0 - 36.0 g/dL   RDW 12.6 11.5 - 15.5 %   Platelets 271 150 - 400 K/uL  Rapid urine drug screen (hospital performed)     Status: Abnormal   Collection Time: 10/04/16 10:24 AM  Result Value Ref Range   Opiates NONE DETECTED NONE DETECTED   Cocaine NONE DETECTED NONE DETECTED   Benzodiazepines NONE DETECTED NONE DETECTED   Amphetamines NONE DETECTED NONE DETECTED   Tetrahydrocannabinol POSITIVE (A) NONE DETECTED   Barbiturates NONE DETECTED NONE DETECTED    Comment:        DRUG SCREEN FOR MEDICAL PURPOSES ONLY.  IF CONFIRMATION IS NEEDED FOR ANY PURPOSE, NOTIFY LAB WITHIN 5 DAYS.        LOWEST DETECTABLE LIMITS FOR URINE DRUG SCREEN Drug Class       Cutoff (ng/mL) Amphetamine      1000 Barbiturate      200 Benzodiazepine   222 Tricyclics       979 Opiates          300 Cocaine          300 THC              50   Pregnancy, urine     Status: None   Collection Time: 10/04/16 10:24 AM  Result Value Ref Range   Preg Test, Ur NEGATIVE NEGATIVE    Comment:        THE SENSITIVITY OF THIS METHODOLOGY IS >20 mIU/mL.     Blood Alcohol level:  Lab Results  Component Value Date   ETH <5 10/04/2016   ETH <5 89/[birth year]1941    Metabolic Disorder Labs:  Lab Results  Component Value Date   HGBA1C 5.0 11/26/2014   MPG 97 11/26/2014   Lab Results  Component Value Date   PROLACTIN 68.7 (H) 11/26/2014   Lab Results  Component Value Date   CHOL 185 11/26/2014   TRIG 165 (H) 11/26/2014   HDL 39 (L) 11/26/2014   CHOLHDL 4.7 11/26/2014   VLDL  33 11/26/2014   LDLCALC 113 (H) 11/26/2014   LDLCALC 87 12/26/2013    Current Medications: Current Facility-Administered Medications  Medication Dose Route Frequency Provider Last Rate Last Dose  . acetaminophen (TYLENOL) tablet 650 mg  650 mg Oral Q6H PRN Benjamine Mola, FNP   650 mg at 10/05/16 1025  . ARIPiprazole (ABILIFY) tablet 5 mg  5 mg Oral QHS Withrow, John C, FNP   5 mg at 10/04/16 2259  .  lamoTRIgine (LAMICTAL) tablet 25 mg  25 mg Oral Daily Benjamine Mola, FNP   25 mg at 10/05/16 0757  . magnesium hydroxide (MILK OF MAGNESIA) suspension 30 mL  30 mL Oral Daily PRN Withrow, John C, FNP      . traZODone (DESYREL) tablet 50 mg  50 mg Oral QHS PRN Benjamine Mola, FNP   50 mg at 10/04/16 2259   PTA Medications: Prescriptions Prior to Admission  Medication Sig Dispense Refill Last Dose  . albuterol (PROVENTIL HFA;VENTOLIN HFA) 108 (90 BASE) MCG/ACT inhaler Inhale 2 puffs into the lungs every 4 (four) hours as needed for wheezing or shortness of breath. 1 Inhaler 0 10/04/2016 at Unknown time  . ARIPiprazole (ABILIFY) 10 MG tablet Take 1 tablet (10 mg total) by mouth daily. (Patient not taking: Reported on 02/12/2015) 30 tablet 0 Not Taking at Unknown time  . ARIPiprazole 400 MG SUSR Inject 400 mg into the muscle every 28 (twenty-eight) days. (Patient not taking: Reported on 10/04/2016) 1 each 1 Not Taking at Unknown time  . benztropine (COGENTIN) 1 MG tablet Take 1 tablet (1 mg total) by mouth 2 (two) times daily after a meal. (Patient not taking: Reported on 10/04/2016) 60 tablet 0 Not Taking at Unknown time  . hydrOXYzine (ATARAX/VISTARIL) 25 MG tablet Take 1 tablet (25 mg total) by mouth every 6 (six) hours as needed for anxiety. (Patient not taking: Reported on 10/04/2016) 30 tablet 0 Not Taking at Unknown time  . IRON PO Take 1 tablet by mouth daily.   10/03/2016 at Unknown time  . lamoTRIgine (LAMICTAL) 25 MG tablet Take 1 tablet (25 mg total) by mouth daily. (Patient not taking: Reported on 10/04/2016) 30 tablet 0 Not Taking at Unknown time  . Omega-3 Fatty Acids (FISH OIL PO) Take 1 capsule by mouth daily.   10/03/2016 at Unknown time  . traZODone (DESYREL) 50 MG tablet Take 1 tablet (50 mg total) by mouth at bedtime as needed for sleep. (Patient not taking: Reported on 10/04/2016) 30 tablet 0 Not Taking at Unknown time    Musculoskeletal: Strength & Muscle Tone: within normal  limits Gait & Station: normal Patient leans: N/A  Psychiatric Specialty Exam: Physical Exam  Review of Systems  Constitutional: Negative.  Negative for chills, fever and malaise/fatigue.  HENT: Negative.  Negative for congestion and sore throat.   Eyes: Negative.  Negative for blurred vision, double vision and redness.  Respiratory: Negative.  Negative for cough, shortness of breath and wheezing.   Cardiovascular: Negative.  Negative for chest pain and palpitations.  Gastrointestinal: Negative.  Negative for abdominal pain, heartburn, nausea and vomiting.  Musculoskeletal: Negative.  Negative for falls and myalgias.  Neurological: Negative.  Negative for dizziness, seizures, loss of consciousness and headaches.  Endo/Heme/Allergies: Negative.  Negative for environmental allergies.  Psychiatric/Behavioral: Positive for hallucinations, substance abuse and suicidal ideas. Negative for depression. The patient has insomnia. The patient is not nervous/anxious.     Blood pressure 107/72, pulse 95, temperature 98.7 F (37.1 C), resp. rate  16, height '5\' 1"'  (1.549 m), weight 56.2 kg (124 lb), last menstrual period 04/12/2011.Body mass index is 23.43 kg/m.  General Appearance: Casual  Eye Contact:  Fair  Speech:  Clear and Coherent and Normal Rate  Volume:  Increased  Mood:  Irritable  Affect:  Congruent and Labile  Thought Process:  Coherent and Descriptions of Associations: Loose  Orientation:  Full (Time, Place, and Person)  Thought Content:  Delusions, Hallucinations: Command:  Telling to kill herself and others, Paranoid Ideation and Rumination  Suicidal Thoughts:  Yes.  with intent/plan  Homicidal Thoughts:  Yes.  with intent/plan  Memory:  Immediate;   Fair Recent;   Fair Remote;   Fair  Judgement:  Poor  Insight:  Lacking  Psychomotor Activity:  Mannerisms  Concentration:  Concentration: Fair and Attention Span: Fair  Recall:  [employer] of Knowledge:  Fair  Language:  Fair   Akathisia:  No  Handed:  Right  AIMS (if indicated):     Assets:  Housing Social Support  ADL's:  Impaired  Cognition:  WNL  Sleep:  Number of Hours: 5.75    Treatment Plan Summary: Daily contact with patient to assess and evaluate symptoms and progress in treatment and Medication management  Observation Level/Precautions:  15 minute checks  Laboratory:  To review labs done.To order lipid panel, hemoglobin A1c and TSH on patient   Psychotherapy:  Patient to participate in therapeutic milieu   Medications: Patient started on an Abilify 5 mg at bedtime to help with psychosis. Patient started on Lamictal 25 mg daily to help with mood stabilization. The risks and benefits along with side effects were discussed with patient. Trazodone 50 mg at bedtime as needed for sleep   Consultations:  None at this time   Discharge Concerns:  Patient is safely and effectively participate in outpatient treatment   Estimated LOS:5-7 days   Other:  Patient has a long history of noncompliance with medication, will work with patient in regards to education in regards to her illness and the need for medication compliance    Physician Treatment Plan for Primary Diagnosis: <principal problem not specified> Long Term Goal(s): Improvement in symptoms so as ready for discharge  Short Term Goals: Ability to disclose and discuss suicidal ideas, Ability to identify and develop effective coping behaviors will improve, Compliance with prescribed medications will improve and Ability to identify triggers associated with substance abuse/mental health issues will improve  Physician Treatment Plan for Secondary Diagnosis: Active Problems:   Paranoid ideation ([place])  Long Term Goal(s): Improvement in symptoms so as ready for discharge  Short Term Goals: Ability to verbalize feelings will improve, Ability to identify and develop effective coping behaviors will improve, Compliance with prescribed medications will improve  and Ability to identify triggers associated with substance abuse/mental health issues will improve  I certify that inpatient services furnished can reasonably be expected to improve the patient's condition.    Hampton Abbot, MD 7/11/20185:46 PM

## 2016-10-05 NOTE — Progress Notes (Signed)
Recreation Therapy Notes  Date: 07.11.2018 Time: 10:00am Location: [address] Dayroom  Group Topic: Positivity and Motivation  Goal Area(s) Addresses:  Patient will be able to identify thoughts and ideas that are motivational to them.  Behavioral Response: Engaged  Intervention: Art  Activity: Pt will be identifying a positive quote or phrase that motivates them. Pt will use printer paper, magazines, markers, colored pencils, scissors and glue to create a motivational poster. Pt will also use pictures from magazines to represent things that motivate them.  Education: Positivity, Motivation, Discharge Planning  Education Outcome: Acknowledges understanding   Clinical Observations/Feedback: Pt actively participated in the activity. Approximately 10:23am pt went to see the nurse because she stated she had a headache, approximately 10:26am pt returned to group. Pt identified her quote as "get angry, sin not" and used pictures that said "happy feet" and a picture of a penguin to represent her family. Pt identified a specific acronym to represent how she lives her life, "j.u.m.p" for "jesus understands my problems." Pt identified that she is motivated every day by taking care of her children.  Donovan Kail, Recreation Therapy Intern  Victorino Sparrow, LRT/CTRS

## 2016-10-05 NOTE — Progress Notes (Addendum)
D: Pt was in the dayroom upon initial approach.  Pt presents with appropriate affect and mood.  She describes her mood as "happy" and reports her goal was to "stay up a little bit more and I did my goal, I went to both groups."  Pt denies SI/HI, denies hallucinations since admission to [facility], reports R shoulder pain of 10/10.  Pt reports urinary urgency, increased frequency, and reports it feels "weird" when she urinates.  Pt has been visible in milieu interacting with peers and staff appropriately.  Pt attended evening group.    A: Introduced self to pt.  Actively listened to pt and offered support and encouragement. Medications administered per order.  On-site provider notified of pt's complaints and new order placed, see MAR.  PRN medication administered for pain and sleep.  PO fluids encouraged and provided.  Heat packs provided for pain.  Q15 minute safety checks maintained.  R: Pt is safe on the unit.  Pt is compliant with medications.  Pt verbally contracts for safety.  Will continue to monitor and assess.

## 2016-10-06 DIAGNOSIS — F250 Schizoaffective disorder, bipolar type: Principal | ICD-10-CM

## 2016-10-06 DIAGNOSIS — Z87891 Personal history of nicotine dependence: Secondary | ICD-10-CM

## 2016-10-06 DIAGNOSIS — F1210 Cannabis abuse, uncomplicated: Secondary | ICD-10-CM

## 2016-10-06 LAB — LIPID PANEL
Cholesterol: 195 mg/dL (ref 0–200)
HDL: 45 mg/dL (ref >40)
LDL CALC: 126 mg/dL — AB (ref 0–99)
Total CHOL/HDL Ratio: 4.3 RATIO
Triglycerides: 118 mg/dL (ref <150)
VLDL: 24 mg/dL (ref 0–40)

## 2016-10-06 LAB — TSH: TSH: 0.752 u[IU]/mL (ref 0.350–4.500)

## 2016-10-06 MED ORDER — NITROFURANTOIN MONOHYD MACRO 100 MG PO CAPS
100.0000 mg | ORAL_CAPSULE | Freq: Two times a day (BID) | ORAL | Status: DC
  Administered 2016-10-06 – 2016-10-07 (×3): 100 mg via ORAL
  Filled 2016-10-06: qty 11
  Filled 2016-10-06: qty 1
  Filled 2016-10-06: qty 11
  Filled 2016-10-06 (×4): qty 1

## 2016-10-06 MED ORDER — ARIPIPRAZOLE 10 MG PO TABS
10.0000 mg | ORAL_TABLET | Freq: Every day | ORAL | Status: DC
  Administered 2016-10-06: 10 mg via ORAL
  Filled 2016-10-06: qty 1
  Filled 2016-10-06: qty 14
  Filled 2016-10-06 (×2): qty 1

## 2016-10-06 NOTE — Progress Notes (Addendum)
[facility] MD Progress Note  10/06/2016 1:33 PM Valerie Lee   MRN:  [id number]   Subjective:  Patient reports today that she has been doing well since being restarted on Abilify. She states that she has not had any hallucinations or delusional thoughts. She reports that her 41 y.o. Daughter got married. The daughter was going to [facility] for vacation and after her daughter got to [place] her husband got a job and they are not coming back to [place]. This made her very upset and this triggered her episode this time. She reports that she was with [facility] 1-2 years ago and was on Abilify Maintena once a month and would like to get back on that medication monthly. She was informed that we will have her on the oral for a couple of more days and then will discuss the injectable. She reports that she has a great church family and her minister Horris Latino has promised her transportation to and from any treatments she needs. She requests that we contact Horris Latino with her discharge information. She is denying any AVH or suicidal or homicidal thoughts today as well.   Principal Problem: Schizoaffective disorder, mixed type ([place])   Diagnosis:   Patient Active Problem List   Diagnosis Date Noted  . Marijuana abuse [F12.10] 10/05/2016  . Paranoid ideation ([place]) [F22] 10/04/2016  . 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 Schizoaffective disorder, mixed type ([place]) [F25.0] 11/24/2014  . Fibroid uterus [D25.9] 05/12/2011  . Pelvic pain [R10.2] 05/12/2011   Total Time spent with patient: 45 minutes  Past Psychiatric History: See H&P  Past Medical History:  Past Medical History:  Diagnosis Date  . Anemia   . Anxiety   . Asthma    rarely uses albuterol rescue  . Bipolar 1 disorder ([place])   . Blood transfusion   . G6PD (glucose 6 phosphatase deficiency)   . G6PD deficiency anemia ([place])   . Mental disorder   . Paranoia ([place])   . Schizophrenia ([place])   . Schizophrenia [facility])     Past Surgical History:   Procedure Laterality Date  . ABDOMINAL HYSTERECTOMY  05/26/2011   Procedure: HYSTERECTOMY ABDOMINAL;  Surgeon: Mora Bellman, MD;  Location: [facility];  Service: Gynecology;  Laterality: N/A;  . CHOLECYSTECTOMY  2011  . TUBAL LIGATION    . TUBAL LIGATION     Family History:  Family History  Problem Relation Age of Onset  . Hypertension Mother    Family Psychiatric  History: See H&P  Social History:  History  Alcohol Use No     History  Drug Use No    Social History   Social History  . Marital status: Married    Spouse name: N/A  . Number of children: N/A  . Years of education: N/A   Social History Main Topics  . Smoking status: Former Smoker    Packs/day: 1.50    Years: 24.00    Types: Cigarettes  . Smokeless tobacco: Never Used  . Alcohol use No  . Drug use: No  . Sexual activity: Yes    Birth control/ protection: Surgical   Other Topics Concern  . None   Social History Narrative  . None   Additional Social History:    Pain Medications: see MAR Prescriptions: see MAR Over the Counter: see MAR History of alcohol / drug use?: Yes Name of Substance 1: cannabis 1 - Age of First Use: unknown  1 - Amount (size/oz): unknown 1 - Frequency: UDS positive for cannabis  1 - Duration: unknown  1 - Last Use / Amount: unknown- reports last use was a few years ago                  Sleep: Good  Appetite:  Good  Current Medications: Current Facility-Administered Medications  Medication Dose Route Frequency Provider Last Rate Last Dose  . acetaminophen (TYLENOL) tablet 650 mg  650 mg Oral Q6H PRN Benjamine Mola, FNP   650 mg at 10/06/16 0750  . ARIPiprazole (ABILIFY) tablet 10 mg  10 mg Oral QHS Rayven Hendrickson B, FNP      . lamoTRIgine (LAMICTAL) tablet 25 mg  25 mg Oral Daily Benjamine Mola, FNP   25 mg at 10/06/16 0746  . magnesium hydroxide (MILK OF MAGNESIA) suspension 30 mL  30 mL Oral Daily PRN Benjamine Mola, FNP   30 mL at 10/06/16 0758  .  nitrofurantoin (macrocrystal-monohydrate) (MACROBID) capsule 100 mg  100 mg Oral Q12H Eappen, Saramma, MD   100 mg at 10/06/16 0750  . traZODone (DESYREL) tablet 50 mg  50 mg Oral QHS PRN Benjamine Mola, FNP   50 mg at 10/05/16 2152    Lab Results:  Results for orders placed or performed during the hospital encounter of 10/04/16 (from the past 48 hour(s))  Urinalysis, Routine w reflex microscopic     Status: Abnormal   Collection Time: 10/05/16  5:00 PM  Result Value Ref Range   Color, Urine YELLOW YELLOW   APPearance HAZY (A) CLEAR   Specific Gravity, Urine 1.026 1.005 - 1.030   pH 5.0 5.0 - 8.0   Glucose, UA NEGATIVE NEGATIVE mg/dL   Hgb urine dipstick SMALL (A) NEGATIVE   Bilirubin Urine NEGATIVE NEGATIVE   Ketones, ur NEGATIVE NEGATIVE mg/dL   Protein, ur NEGATIVE NEGATIVE mg/dL   Nitrite NEGATIVE NEGATIVE   Leukocytes, UA MODERATE (A) NEGATIVE   RBC / HPF 6-30 0 - 5 RBC/hpf   WBC, UA 6-30 0 - 5 WBC/hpf   Bacteria, UA NONE SEEN NONE SEEN   Squamous Epithelial / LPF 6-30 (A) NONE SEEN   Mucous PRESENT     Comment: Performed at [facility], [place] [address], [place], [place] [address]  Lipid panel     Status: Abnormal   Collection Time: 10/06/16  6:42 AM  Result Value Ref Range   Cholesterol 195 0 - 200 mg/dL   Triglycerides 118 <150 mg/dL   HDL 45 >40 mg/dL   Total CHOL/HDL Ratio 4.3 RATIO   VLDL 24 0 - 40 mg/dL   LDL Cholesterol 126 (H) 0 - 99 mg/dL    Comment:        Total Cholesterol/HDL:CHD Risk Coronary Heart Disease Risk Table                     Men   Women  1/2 Average Risk   3.4   3.3  Average Risk       5.0   4.4  2 X Average Risk   9.6   7.1  3 X Average Risk  23.4   11.0        Use the calculated Patient Ratio above and the CHD Risk Table to determine the patient's CHD Risk.        ATP III CLASSIFICATION (LDL):  <100     mg/dL   Optimal  100-129  mg/dL   Near or Above  Optimal  130-159  mg/dL   Borderline   160-189  mg/dL   High  >190     mg/dL   Very High Performed at [place] [address] [place], [place] [address]   TSH     Status: None   Collection Time: 10/06/16  6:42 AM  Result Value Ref Range   TSH 0.752 0.350 - 4.500 uIU/mL    Comment: Performed by a 3rd Generation assay with a functional sensitivity of <=0.01 uIU/mL. Performed at [facility], [place] [address], [place],  [address]     Blood Alcohol level:  Lab Results  Component Value Date   [facility] <5 10/04/2016   ETH <5 75/12/2583    Metabolic Disorder Labs: Lab Results  Component Value Date   HGBA1C 5.0 11/26/2014   MPG 97 11/26/2014   Lab Results  Component Value Date   PROLACTIN 68.7 (H) 11/26/2014   Lab Results  Component Value Date   CHOL 195 10/06/2016   TRIG 118 10/06/2016   HDL 45 10/06/2016   CHOLHDL 4.3 10/06/2016   VLDL 24 10/06/2016   LDLCALC 126 (H) 10/06/2016   LDLCALC 113 (H) 11/26/2014    Physical Findings: AIMS: Facial and Oral Movements Muscles of Facial Expression: None, normal Lips and Perioral Area: None, normal Jaw: None, normal Tongue: None, normal,Extremity Movements Upper (arms, wrists, hands, fingers): None, normal Lower (legs, knees, ankles, toes): None, normal, Trunk Movements Neck, shoulders, hips: None, normal, Overall Severity Severity of abnormal movements (highest score from questions above): None, normal Incapacitation due to abnormal movements: None, normal Patient's awareness of abnormal movements (rate only patient's report): No Awareness, Dental Status Current problems with teeth and/or dentures?: No Does patient usually wear dentures?: No  CIWA:    COWS:     Musculoskeletal: Strength & Muscle Tone: within normal limits Gait & Station: normal Patient leans: N/A  Psychiatric Specialty Exam: Physical Exam  Constitutional: She is oriented to person, place, and time. She appears well-developed and well-nourished.  Eyes: Pupils  are equal, round, and reactive to light.  Cardiovascular: Normal rate.   Respiratory: Effort normal.  Neurological: She is alert and oriented to person, place, and time.    Review of Systems  Constitutional: Negative.   HENT: Negative.   Eyes: Negative.   Respiratory: Negative.   Cardiovascular: Negative.   Gastrointestinal: Negative.   Genitourinary: Negative.   Musculoskeletal: Negative.   Skin: Negative.   Neurological: Negative.   Endo/Heme/Allergies: Negative.   Psychiatric/Behavioral: Negative.     Blood pressure 102/78, pulse 95, temperature 99 F (37.2 C), temperature source Oral, resp. rate 16, height 5\' 1"  (1.549 m), weight 56.2 kg (124 lb), last menstrual period 04/12/2011.Body mass index is 23.43 kg/m.  General Appearance: Casual and Fairly Groomed  Eye Contact:  Good  Speech:  Clear and Coherent  Volume:  Normal  Mood:  Euthymic  Affect:  Appropriate  Thought Process:  Coherent, Goal Directed and Descriptions of Associations: Intact  Orientation:  Full (Time, Place, and Person)  Thought Content:  WDL  Suicidal Thoughts:  No  Homicidal Thoughts:  No  Memory:  Recent;   Good  Judgement:  Good  Insight:  Good  Psychomotor Activity:  Negative  Concentration:  Concentration: Good  Recall:  Good  Fund of Knowledge:  Fair  Language:  Good  Akathisia:  No  Handed:  Right  AIMS (if indicated):     Assets:  Social Support  ADL's:  Intact  Cognition:  WNL  Sleep:  Number of Hours: 6.75     Treatment Plan Summary: Daily contact with patient to assess and evaluate symptoms and progress in treatment, Medication management and Plan is to increase Abilify to 10 mg PO Daily and then start her on Abilify MAintena before discharge. Have arrangements for her to be see back at [facility] for treatment and medication follow-up. She will also follow-up with therapy sessions at [facility].   [place], FNP 10/06/2016, 1:33 PM

## 2016-10-06 NOTE — Progress Notes (Signed)
Pt was given lavender/peppermint blend with 1% dilution coconut oil for a headache.

## 2016-10-06 NOTE — Progress Notes (Signed)
Recreation Therapy Notes  Date: 10/06/2016 Time: 10:00am Location: [address] Dayroom  Group Topic: Leisure Education and Goal Setting  Goal Area(s) Addresses:  Pt will be able to successfully identify 10 things they would like to accomplish during their lifetime. Patients will be able to successfully identify 2 ways they can reach their goals.  Behavioral Response: Engaged  Intervention: Worksheet  Activity: Pt was asked to create a bucket list of things they would like to accomplish during their lifetime. Pt was then asked to pick their top two things and create a goal for each on how they were going to accomplish them.  Education: Leisure Education, Financial trader, Discharge Planning  Education Outcome: Acknowledges education  Clinical Observations/Feedback: Pt actively participated in activity. Pt identified that he would like to travel to [place], [place] and [place], where she is originally from and she would like to grow some vegetables. Pt identifed that she plans on starting to save money and doing research on how to grow vegetables. Pt identified that finding a balance between her responsibilities and making time to participate in her leisure interests would make her happy and healthy. Approximately 10:42am PA asked to see patient and patient did not return to group.  Donovan Kail, Recreation Therapy Intern  Victorino Sparrow, LRT/CTRS

## 2016-10-06 NOTE — Progress Notes (Signed)
Patient attended group and participated

## 2016-10-06 NOTE — Progress Notes (Signed)
D:Pt reports that she has 6 children and her oldest child that moved to [place] was born when pt was 39. She says that she is a friend to her and was happy that she received a call from her last night. Pt is pleasant attending groups and interacting on the unit.  A:Offered support, encouragement and 15 minute checks.  R:Pt denies si and hi. Safety maintained on the unit.

## 2016-10-06 NOTE — [facility] Group Notes (Signed)
[place] LCSW Group Therapy  10/06/2016 1:15 - 2 PM  Type of Therapy:  Group Therapy  Participation Level:  Active  Participation Quality:  Appropriate  Affect:  Appropriate  Cognitive:  Appropriate  Insight:  Developing/Improving  Engagement in Therapy:  Developing/Improving  Modes of Intervention:  Education, Exploration and Support  Summary of Progress/Problems: [facility] Speaker came to talk about his personal journey with substance abuse and addiction. The pt processed ways by which to relate to the speaker. [place] speaker provided handouts and educational information pertaining to groups and services offered by the [facility].   Beverely Pace 10/06/2016, 1:45 PM

## 2016-10-07 LAB — HEMOGLOBIN A1C
Hgb A1c MFr Bld: 4.7 % — ABNORMAL LOW (ref 4.8–5.6)
MEAN PLASMA GLUCOSE: 88 mg/dL

## 2016-10-07 MED ORDER — LAMOTRIGINE 25 MG PO TABS: 25.0000 mg | ORAL_TABLET | Freq: Every day | ORAL | 0 refills | Status: DC

## 2016-10-07 MED ORDER — ARIPIPRAZOLE ER 400 MG IM SRER: 400.0000 mg | INTRAMUSCULAR | 0 refills | Status: DC

## 2016-10-07 MED ORDER — NITROFURANTOIN MONOHYD MACRO 100 MG PO CAPS: 100.0000 mg | ORAL_CAPSULE | Freq: Two times a day (BID) | ORAL | 0 refills | Status: DC

## 2016-10-07 MED ORDER — ARIPIPRAZOLE ER 400 MG IM SRER
400.0000 mg | INTRAMUSCULAR | Status: DC
  Administered 2016-10-07: 400 mg via INTRAMUSCULAR

## 2016-10-07 MED ORDER — ARIPIPRAZOLE 10 MG PO TABS: 10.0000 mg | ORAL_TABLET | Freq: Every day | ORAL | 0 refills | Status: DC

## 2016-10-07 MED ORDER — TRAZODONE HCL 50 MG PO TABS: 50.0000 mg | ORAL_TABLET | Freq: Every evening | ORAL | 0 refills | Status: DC | PRN

## 2016-10-07 NOTE — Progress Notes (Signed)
Recreation Therapy Notes  INPATIENT RECREATION TR PLAN  Patient Details Name: Valerie Lee MRN: [id number] DOB: 1975/05/22 Today's Date: 10/07/2016  Rec Therapy Plan Is patient appropriate for Therapeutic Recreation?: Yes Treatment times per week: at least 3x Estimated Length of Stay: 5-7 days TR Treatment/Interventions: Group participation (Comment) (appropriately participate in recreation therapy group sessions)  Discharge Criteria Pt will be discharged from therapy if:: Discharged Treatment plan/goals/alternatives discussed and agreed upon by:: Patient/family  Discharge Summary Short term goals set: Patient will be able to identify at least 5 coping skills for anger management  by conclusion of recreation therapy tx  Short term goals met: Complete Progress toward goals comments: One-to-one attended Which groups?: Leisure education, Goal setting, Teamwork, Problem-Solving, Positivity and Motivation One-to-one attended: coping skills for anger management Reason goals not met: n/a Therapeutic equipment acquired: n/a Reason patient discharged from therapy: Discharge from hospital Pt/family agrees with progress & goals achieved: Yes Date patient discharged from therapy: 10/07/16  Donovan Kail, Recreation Therapy Intern  Victorino Sparrow, LRT/CTRS  Donovan Kail 10/07/2016, 11:44 AM

## 2016-10-07 NOTE — Progress Notes (Signed)
D- Patient is pleasant this shift.  Patient currently denies SI, HI, and AVH. Patient reports that she is suppose to be getting an Abilify shot.  Patient states "I don't want the shot because I won't be able to function working.  I've had it before and it makes me sleep all the time. One time I missed an entire summer because all I would do is sleep".  Patient prefers to be on the Abilify pill and feels that she can be compliant on the pill since she is now taking the medication at night.  Patient has complaints of right shoulder pain from unknown cause. Patient was given a heating pack which was effective in offering some pain relief.  Patient reports that shoulder improved with movement stating "I was dancing during karaoke and now my shoulder feels better". Additionally, patient was given pain medication per MD order. See MAR.  Patient reports that she had an "excellent" day and states that best part of her day was Felisa Bonier because she loves singing and dancing.  A- Scheduled medications administered to patient, per MD orders. Support and encouragement provided.  R- Patient receptive, calm, and cooperative. Patient contracts for safety.  Patient remains safe at this time.

## 2016-10-07 NOTE — Progress Notes (Signed)
  [facility] Adult Case Management Discharge Plan :  Will you be returning to the same living situation after discharge:  Yes,  home At discharge, do you have transportation home?: Yes,  friend Do you have the ability to pay for your medications: Yes,  insurance  Release of information consent forms completed and in the chart;  Patient's signature needed at discharge.  Patient to Follow up at: Follow-up Information    BEHAVIORAL HEALTH OUTPATIENT THERAPY [place] Follow up on 10/12/2016.   Specialty:  Behavioral Health Why:  Hospital discharge follow up appointment w Dr Lovena Le on 7/18 at 1 PM.  Please arrive by 12 Noon to complete paperwork.  Call to cancel/reschedule if needed.  Bring insurance card and photo ID to this appointment.   Contact information: [place] [phone] [place] [phone]          Next level of care provider has access to [place] and Suicide Prevention discussed: Yes,  yes  Have you used any form of tobacco in the last 30 days? (Cigarettes, Smokeless Tobacco, Cigars, and/or Pipes): No  Has patient been referred to the Quitline?: N/A patient is not a smoker  Patient has been referred for addiction treatment: [place] 10/07/2016, 11:12 AM

## 2016-10-07 NOTE — Progress Notes (Signed)
[facility] MD Progress Note  10/07/2016 9:52 AM Valerie Lee  MRN:  [id number]   Subjective:  Patient presents in the hallway after group and states that she is feeling "100 times better." She reports that she likes the effects of the Abilify and still wants to continue the Abilify Maintena monthly. She is pleased to hear the possibility of discharge and agrees to outpatient treatment with either [facility] or [facility] outpatient, but states she would prefer [facility]. She denies any SI/HI/AVH at this time.  Objective:  Patient is very pleasant and has logical reasoning and asks appropriate questions. She has previously made arrangements for transportation for outpatient treatment through her church. She also agrees that the injectable is better for her staying compliant with medication.  Principal Problem: Schizoaffective disorder, mixed type ([place]) Diagnosis:   Patient Active Problem List   Diagnosis Date Noted  . Marijuana abuse [F12.10] 10/05/2016  . Paranoid ideation ([place]) [F22] 10/04/2016  . 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 Schizoaffective disorder, mixed type ([place]) [F25.0] 11/24/2014  . Fibroid uterus [D25.9] 05/12/2011  . Pelvic pain [R10.2] 05/12/2011   Total Time spent with patient: 25 minutes  Past Psychiatric History: See H&P  Past Medical History:  Past Medical History:  Diagnosis Date  . Anemia   . Anxiety   . Asthma    rarely uses albuterol rescue  . Bipolar 1 disorder ([place])   . Blood transfusion   . G6PD (glucose 6 phosphatase deficiency)   . G6PD deficiency anemia ([place])   . Mental disorder   . Paranoia ([place])   . Schizophrenia ([place])   . Schizophrenia [facility])     Past Surgical History:  Procedure Laterality Date  . ABDOMINAL HYSTERECTOMY  05/26/2011   Procedure: HYSTERECTOMY ABDOMINAL;  Surgeon: Mora Bellman, MD;  Location: [facility];  Service: Gynecology;  Laterality: N/A;  . CHOLECYSTECTOMY  2011  . TUBAL LIGATION    . TUBAL LIGATION      Family History:  Family History  Problem Relation Age of Onset  . Hypertension Mother    Family Psychiatric  History: See H&P Social History:  History  Alcohol Use No     History  Drug Use No    Social History   Social History  . Marital status: Married    Spouse name: N/A  . Number of children: N/A  . Years of education: N/A   Social History Main Topics  . Smoking status: Former Smoker    Packs/day: 1.50    Years: 24.00    Types: Cigarettes  . Smokeless tobacco: Never Used  . Alcohol use No  . Drug use: No  . Sexual activity: Yes    Birth control/ protection: Surgical   Other Topics Concern  . None   Social History Narrative  . None   Additional Social History:    Pain Medications: see MAR Prescriptions: see MAR Over the Counter: see MAR History of alcohol / drug use?: Yes Name of Substance 1: cannabis 1 - Age of First Use: unknown  1 - Amount (size/oz): unknown 1 - Frequency: UDS positive for cannabis 1 - Duration: unknown  1 - Last Use / Amount: unknown- reports last use was a few years ago                  Sleep: Good  Appetite:  Good  Current Medications: Current Facility-Administered Medications  Medication Dose Route Frequency Provider Last Rate Last Dose  . acetaminophen (TYLENOL) tablet 650 mg  650 mg Oral Q6H PRN Benjamine Mola, FNP   650 mg at 10/06/16 2200  . ARIPiprazole (ABILIFY) tablet 10 mg  10 mg Oral QHS Karver Fadden, Darnelle Maffucci B, FNP   10 mg at 10/06/16 2200  . ARIPiprazole ER SRER 400 mg  400 mg Intramuscular Q30 days Atonya Templer, Lowry Ram, FNP      . lamoTRIgine (LAMICTAL) tablet 25 mg  25 mg Oral Daily Benjamine Mola, FNP   25 mg at 10/07/16 0947  . magnesium hydroxide (MILK OF MAGNESIA) suspension 30 mL  30 mL Oral Daily PRN Benjamine Mola, FNP   30 mL at 10/06/16 0758  . nitrofurantoin (macrocrystal-monohydrate) (MACROBID) capsule 100 mg  100 mg Oral Q12H Eappen, Saramma, MD   100 mg at 10/07/16 0947  . traZODone (DESYREL) tablet  50 mg  50 mg Oral QHS PRN Benjamine Mola, FNP   50 mg at 10/06/16 2200    Lab Results:  Results for orders placed or performed during the hospital encounter of 10/04/16 (from the past 48 hour(s))  Urinalysis, Routine w reflex microscopic     Status: Abnormal   Collection Time: 10/05/16  5:00 PM  Result Value Ref Range   Color, Urine YELLOW YELLOW   APPearance HAZY (A) CLEAR   Specific Gravity, Urine 1.026 1.005 - 1.030   pH 5.0 5.0 - 8.0   Glucose, UA NEGATIVE NEGATIVE mg/dL   Hgb urine dipstick SMALL (A) NEGATIVE   Bilirubin Urine NEGATIVE NEGATIVE   Ketones, ur NEGATIVE NEGATIVE mg/dL   Protein, ur NEGATIVE NEGATIVE mg/dL   Nitrite NEGATIVE NEGATIVE   Leukocytes, UA MODERATE (A) NEGATIVE   RBC / HPF 6-30 0 - 5 RBC/hpf   WBC, UA 6-30 0 - 5 WBC/hpf   Bacteria, UA NONE SEEN NONE SEEN   Squamous Epithelial / LPF 6-30 (A) NONE SEEN   Mucous PRESENT     Comment: Performed at [facility], [place] [address], [place], [place] [address]  Lipid panel     Status: Abnormal   Collection Time: 10/06/16  6:42 AM  Result Value Ref Range   Cholesterol 195 0 - 200 mg/dL   Triglycerides 118 <150 mg/dL   HDL 45 >40 mg/dL   Total CHOL/HDL Ratio 4.3 RATIO   VLDL 24 0 - 40 mg/dL   LDL Cholesterol 126 (H) 0 - 99 mg/dL    Comment:        Total Cholesterol/HDL:CHD Risk Coronary Heart Disease Risk Table                     Men   Women  1/2 Average Risk   3.4   3.3  Average Risk       5.0   4.4  2 X Average Risk   9.6   7.1  3 X Average Risk  23.4   11.0        Use the calculated Patient Ratio above and the CHD Risk Table to determine the patient's CHD Risk.        ATP III CLASSIFICATION (LDL):  <100     mg/dL   Optimal  100-129  mg/dL   Near or Above                    Optimal  130-159  mg/dL   Borderline  160-189  mg/dL   High  >190     mg/dL   Very High Performed at [facility], [address] [place], [place] [address]   Hemoglobin A1c     Status: Abnormal    Collection Time: 10/06/16  6:42 AM  Result Value Ref Range   Hgb A1c MFr Bld 4.7 (L) 4.8 - 5.6 %    Comment: (NOTE)         Pre-diabetes: 5.7 - 6.4         Diabetes: >6.4         Glycemic control for adults with diabetes: <7.0    Mean Plasma Glucose 88 mg/dL    Comment: (NOTE) Performed At: [facility] [place], [place] [id number] Lindon Romp MD RF:[id number] Performed at [facility], [place] [address] [place], [place] [address]   TSH     Status: None   Collection Time: 10/06/16  6:42 AM  Result Value Ref Range   TSH 0.752 0.350 - 4.500 uIU/mL    Comment: Performed by a 3rd Generation assay with a functional sensitivity of <=0.01 uIU/mL. Performed at [facility], [place] [address], [place], [place] [address]     Blood Alcohol level:  Lab Results  Component Value Date   [facility] <5 10/04/2016   ETH <5 03/00/9233    Metabolic Disorder Labs: Lab Results  Component Value Date   HGBA1C 4.7 (L) 10/06/2016   MPG 88 10/06/2016   MPG 97 11/26/2014   Lab Results  Component Value Date   PROLACTIN 68.7 (H) 11/26/2014   Lab Results  Component Value Date   CHOL 195 10/06/2016   TRIG 118 10/06/2016   HDL 45 10/06/2016   CHOLHDL 4.3 10/06/2016   VLDL 24 10/06/2016   LDLCALC 126 (H) 10/06/2016   LDLCALC 113 (H) 11/26/2014    Physical Findings: AIMS: Facial and Oral Movements Muscles of Facial Expression: None, normal Lips and Perioral Area: None, normal Jaw: None, normal Tongue: None, normal,Extremity Movements Upper (arms, wrists, hands, fingers): None, normal Lower (legs, knees, ankles, toes): None, normal, Trunk Movements Neck, shoulders, hips: None, normal, Overall Severity Severity of abnormal movements (highest score from questions above): None, normal Incapacitation due to abnormal movements: None, normal Patient's awareness of abnormal movements (rate only patient's report): No Awareness, Dental  Status Current problems with teeth and/or dentures?: No Does patient usually wear dentures?: No  CIWA:    COWS:     Musculoskeletal: Strength & Muscle Tone: within normal limits Gait & Station: normal Patient leans: N/A  Psychiatric Specialty Exam: Physical Exam  Constitutional: She is oriented to person, place, and time. She appears well-developed and well-nourished.  Eyes: Pupils are equal, round, and reactive to light.  Cardiovascular: Normal rate.   Respiratory: Effort normal.  Musculoskeletal: Normal range of motion.  Neurological: She is alert and oriented to person, place, and time.    Review of Systems  Constitutional: Negative.   HENT: Negative.   Eyes: Negative.   Respiratory: Negative.   Cardiovascular: Negative.   Gastrointestinal: Negative.   Genitourinary: Negative.   Musculoskeletal: Negative.   Skin: Negative.   Neurological: Negative.   Endo/Heme/Allergies: Negative.   Psychiatric/Behavioral: Negative.     Blood pressure 111/68, pulse 82, temperature 98.5 F (36.9 C), resp. rate 16, height 5\' 1"  (1.549 m), weight 56.2 kg (124 lb), last menstrual period 04/12/2011.Body mass index is 23.43 kg/m.  General Appearance: Casual and Fairly Groomed  Eye Contact:  Good  Speech:  Clear and Coherent and Normal Rate  Volume:  Normal  Mood:  Euthymic  Affect:  Appropriate  Thought Process:  Coherent, Goal Directed and Descriptions of Associations: Intact  Orientation:  Full (Time, Place, and Person)  Thought Content:  WDL  Suicidal Thoughts:  No  Homicidal Thoughts:  No  Memory:  Recent;   Good  Judgement:  Good  Insight:  Good  Psychomotor Activity:  Normal  Concentration:  Concentration: Good  Recall:  Good  Fund of Knowledge:  Good  Language:  Good  Akathisia:  No  Handed:  Right  AIMS (if indicated):     Assets:  Social Teacher, English as a foreign language  ADL's:  Intact  Cognition:  WNL  Sleep:  Number of Hours: 5.25     Treatment Plan Summary: Daily  contact with patient to assess and evaluate symptoms and progress in treatment, Medication management and Plan is to start patient on Abilify Maintena 400 mg IM today and have her setup with outpatient services either through [facility] or [place] [facility]. Patient is in full agreement with plan and agrees to be discharged tomorrow morning. She agrees taht she would prefer another night after her injection and gives her time to make arrangements for her to be picked up. Advised patient to continue group sessions and medication regimen. Also educated patient on the importance of continued treatment and medication after discharge,  Later discussed with Dr. Dwyane Dee and is considered safe for discharge. Please see discharge summary.  [place], FNP 10/07/2016, 9:52 AM

## 2016-10-07 NOTE — Progress Notes (Signed)
Recreation Therapy Notes  Date: 10/07/2016 Time: 10:00am  Location: [address] Dayroom  Group Topic: Teamwork and Problem-Solving  Goal Area(s) Addresses:  Patient will be able to successfully identify what characteristics make up teamwork. Patient will be able to successfully identify how teamwork can benefit their lives post d/c. Patient will be able to successfully identify how problem-solving skills can improve their lives post d/c.  Behavioral Response: Engaged  Intervention: Game  Activity: Pts will work together to keep a Marketing executive from touching the ground. Pts will hit ball back and forth to one another to accomplish this goal.  Education: Teamwork, Problem-Solving, Discharge Planning  Education Outcome: Acknowledges education/  Clinical Observations/Feedback: Pt actively participated in activity. Pt identified that by using teamwork and problem-solving that she could use help from others to solve her problems rather than doing everything by herself.   Donovan Kail, Recreation Therapy Intern  Victorino Sparrow, LRT/CTRS      Donovan Kail 10/07/2016 10:56 AM

## 2016-10-07 NOTE — Tx Team (Signed)
Interdisciplinary Treatment and Diagnostic Plan Update  10/07/2016 Time of Session: 10:14 AM  Valerie Lee MRN: [id number]  Principal Diagnosis: Schizoaffective disorder, mixed type [facility])  Secondary Diagnoses: Principal Problem:   Schizoaffective disorder, mixed type ([place]) Active Problems:   Paranoid ideation ([place])   Marijuana abuse   Current Medications:  Current Facility-Administered Medications  Medication Dose Route Frequency Provider Last Rate Last Dose  . acetaminophen (TYLENOL) tablet 650 mg  650 mg Oral Q6H PRN Benjamine Mola, FNP   650 mg at 10/06/16 2200  . ARIPiprazole (ABILIFY) tablet 10 mg  10 mg Oral QHS Money, Darnelle Maffucci B, FNP   10 mg at 10/06/16 2200  . ARIPiprazole ER SRER 400 mg  400 mg Intramuscular Q30 days Money, Lowry Ram, FNP      . lamoTRIgine (LAMICTAL) tablet 25 mg  25 mg Oral Daily Benjamine Mola, FNP   25 mg at 10/07/16 0947  . magnesium hydroxide (MILK OF MAGNESIA) suspension 30 mL  30 mL Oral Daily PRN Benjamine Mola, FNP   30 mL at 10/06/16 0758  . nitrofurantoin (macrocrystal-monohydrate) (MACROBID) capsule 100 mg  100 mg Oral Q12H Eappen, Saramma, MD   100 mg at 10/07/16 0947  . traZODone (DESYREL) tablet 50 mg  50 mg Oral QHS PRN Benjamine Mola, FNP   50 mg at 10/06/16 2200    PTA Medications: Prescriptions Prior to Admission  Medication Sig Dispense Refill Last Dose  . albuterol (PROVENTIL HFA;VENTOLIN HFA) 108 (90 BASE) MCG/ACT inhaler Inhale 2 puffs into the lungs every 4 (four) hours as needed for wheezing or shortness of breath. 1 Inhaler 0 10/04/2016 at Unknown time  . ARIPiprazole (ABILIFY) 10 MG tablet Take 1 tablet (10 mg total) by mouth daily. (Patient not taking: Reported on 02/12/2015) 30 tablet 0 Not Taking at Unknown time  . ARIPiprazole 400 MG SUSR Inject 400 mg into the muscle every 28 (twenty-eight) days. (Patient not taking: Reported on 10/04/2016) 1 each 1 Not Taking at Unknown time  . benztropine (COGENTIN) 1 MG tablet Take 1  tablet (1 mg total) by mouth 2 (two) times daily after a meal. (Patient not taking: Reported on 10/04/2016) 60 tablet 0 Not Taking at Unknown time  . hydrOXYzine (ATARAX/VISTARIL) 25 MG tablet Take 1 tablet (25 mg total) by mouth every 6 (six) hours as needed for anxiety. (Patient not taking: Reported on 10/04/2016) 30 tablet 0 Not Taking at Unknown time  . IRON PO Take 1 tablet by mouth daily.   10/03/2016 at Unknown time  . lamoTRIgine (LAMICTAL) 25 MG tablet Take 1 tablet (25 mg total) by mouth daily. (Patient not taking: Reported on 10/04/2016) 30 tablet 0 Not Taking at Unknown time  . Omega-3 Fatty Acids (FISH OIL PO) Take 1 capsule by mouth daily.   10/03/2016 at Unknown time  . traZODone (DESYREL) 50 MG tablet Take 1 tablet (50 mg total) by mouth at bedtime as needed for sleep. (Patient not taking: Reported on 10/04/2016) 30 tablet 0 Not Taking at Unknown time    Patient Stressors: Financial difficulties Loss of daughter and grandson moved to [place] Medication change or noncompliance  Patient Strengths: Curator fund of knowledge Motivation for treatment/growth Religious Affiliation  Treatment Modalities: Medication Management, Group therapy, Case management,  1 to 1 session with clinician, Psychoeducation, Recreational therapy.   Physician Treatment Plan for Primary Diagnosis: Schizoaffective disorder, mixed type ([place]) Long Term Goal(s): Improvement in symptoms so as ready for discharge  Short Term  Goals: Ability to disclose and discuss suicidal ideas Ability to identify and develop effective coping behaviors will improve Compliance with prescribed medications will improve Ability to identify triggers associated with substance abuse/mental health issues will improve Ability to verbalize feelings will improve Ability to identify and develop effective coping behaviors will improve Compliance with prescribed medications will improve Ability to identify triggers  associated with substance abuse/mental health issues will improve  Medication Management: Evaluate patient's response, side effects, and tolerance of medication regimen.  Therapeutic Interventions: 1 to 1 sessions, Unit Group sessions and Medication administration.  Evaluation of Outcomes: Adequate for Discharge  Physician Treatment Plan for Secondary Diagnosis: Principal Problem:   Schizoaffective disorder, mixed type ([place]) Active Problems:   Paranoid ideation ([place])   Marijuana abuse   Long Term Goal(s): Improvement in symptoms so as ready for discharge  Short Term Goals: Ability to disclose and discuss suicidal ideas Ability to identify and develop effective coping behaviors will improve Compliance with prescribed medications will improve Ability to identify triggers associated with substance abuse/mental health issues will improve Ability to verbalize feelings will improve Ability to identify and develop effective coping behaviors will improve Compliance with prescribed medications will improve Ability to identify triggers associated with substance abuse/mental health issues will improve  Medication Management: Evaluate patient's response, side effects, and tolerance of medication regimen.  Therapeutic Interventions: 1 to 1 sessions, Unit Group sessions and Medication administration.  Evaluation of Outcomes: Adequate for Discharge   RN Treatment Plan for Primary Diagnosis: Schizoaffective disorder, mixed type ([place]) Long Term Goal(s): Knowledge of disease and therapeutic regimen to maintain health will improve  Short Term Goals: Ability to identify and develop effective coping behaviors will improve and Compliance with prescribed medications will improve  Medication Management: RN will administer medications as ordered by provider, will assess and evaluate patient's response and provide education to patient for prescribed medication. RN will report any adverse and/or side  effects to prescribing provider.  Therapeutic Interventions: 1 on 1 counseling sessions, Psychoeducation, Medication administration, Evaluate responses to treatment, Monitor vital signs and CBGs as ordered, Perform/monitor CIWA, COWS, AIMS and Fall Risk screenings as ordered, Perform wound care treatments as ordered.  Evaluation of Outcomes: Adequate for Discharge   Recreational Therapy Treatment Plan for Primary Diagnosis: Schizoaffective disorder, mixed type ([place]) Long Term Goal(s): Patient will participate in recreation therapy treatment in at least 2 group sessions without prompting from LRT  Short Term Goals: Patient will be able to identify at least 5 coping skills for admitting diagnosis by conclusion of recreation therapy treatment  Treatment Modalities: Group and Pet Therapy  Therapeutic Interventions: Psychoeducation  Evaluation of Outcomes: Progressing   LCSW Treatment Plan for Primary Diagnosis: Schizoaffective disorder, mixed type ([place]) Long Term Goal(s): Safe transition to appropriate next level of care at discharge, Engage patient in therapeutic group addressing interpersonal concerns.  Short Term Goals: Engage patient in aftercare planning with referrals and resources  Therapeutic Interventions: Assess for all discharge needs, 1 to 1 time with Social worker, Explore available resources and support systems, Assess for adequacy in community support network, Educate family and significant other(s) on suicide prevention, Complete Psychosocial Assessment, Interpersonal group therapy.  Evaluation of Outcomes: Adequate for Discharge  Return home, follow up outpt   Progress in Treatment: Attending groups: Yes Participating in groups: Yes Taking medication as prescribed: Yes Toleration medication: Yes, no side effects reported at this time Family/Significant other contact made:  Patient understands diagnosis: Yes AEB Discussing patient identified problems/goals with staff:  Yes Medical problems stabilized or resolved: Yes Denies suicidal/homicidal ideation: Yes Issues/concerns per patient self-inventory: None Other: N/A  New problem(s) identified: Will get an injection on day of d/c.  New Short Term/Long Term Goal(s): None identified at this time.   Discharge Plan or Barriers:   Reason for Continuation of Hospitalization:  Medication stabilization   Estimated Length of Stay: Likely d/c today, tomorrow at the latests  Attendees: Patient: 10/07/2016  10:14 AM  Physician: Hampton Abbot, MD 10/07/2016  10:14 AM  Nursing: Sena Hitch, RN 10/07/2016  10:14 AM  RN Care Manager: Lars Pinks, RN 10/07/2016  10:14 AM  Social Worker: Ripley Fraise 10/07/2016  10:14 AM  Recreational Therapist: Victorino Sparrow, LRT/CTRS 10/07/2016  10:14 AM  Other: Norberto Sorenson 10/07/2016  10:14 AM  Other: Donovan Kail, Recreation Therapy Intern 10/07/2016  10:14 AM    Scribe for Treatment Team:  Roque Lias LCSW 10/07/2016 10:14 AM

## 2016-10-07 NOTE — Discharge Summary (Signed)
Physician Discharge Summary Note  Patient:  Valerie Lee is an 41 y.o., female MRN:  [id number] DOB:  [birth year] Patient phone:  [phone] (home)  Patient address:   [address] [place] [address],   Total Time spent with patient: 25 minutes  Date of Admission:  10/04/2016 Date of Discharge: 10/07/2016  Reason for Admission:  AVH with commands to hurt herself and others.  Principal Problem: Schizoaffective disorder, mixed type [facility]) Discharge Diagnoses: Patient Active Problem List   Diagnosis Date Noted  . Marijuana abuse [F12.10] 10/05/2016  . Paranoid ideation (HCC) [F22] 10/04/2016  . Hyperprolactinemia (HCC) [E22.1] 11/28/2014  . Schizoaffective disorder, mixed type (HCC) [F25.0] 11/24/2014  . Fibroid uterus [D25.9] 05/12/2011  . Pelvic pain [R10.2] 05/12/2011    Past Psychiatric History: Hx of multiple psychiatric hospitalizations and medication and treatment noncompliance  Past Medical History:  Past Medical History:  Diagnosis Date  . Anemia   . Anxiety   . Asthma    rarely uses albuterol rescue  . Bipolar 1 disorder (HCC)   . Blood transfusion   . G6PD (glucose 6 phosphatase deficiency)   . G6PD deficiency anemia (HCC)   . Mental disorder   . Paranoia (HCC)   . Schizophrenia (HCC)   . Schizophrenia [facility])     Past Surgical History:  Procedure Laterality Date  . ABDOMINAL HYSTERECTOMY  05/26/2011   Procedure: HYSTERECTOMY ABDOMINAL;  Surgeon: Catalina Antigua, MD;  Location: [facility];  Service: Gynecology;  Laterality: N/A;  . CHOLECYSTECTOMY  2011  . TUBAL LIGATION    . TUBAL LIGATION     Family History:  Family History  Problem Relation Age of Onset  . Hypertension Mother    Family Psychiatric  History: Pt denies any Social History:  History  Alcohol Use No     History  Drug Use No    Social History   Social History  . Marital status: Married    Spouse name: N/A  . Number of children: N/A  . Years of education:  N/A   Social History Main Topics  . Smoking status: Former Smoker    Packs/day: 1.50    Years: 24.00    Types: Cigarettes  . Smokeless tobacco: Never Used  . Alcohol use No  . Drug use: No  . Sexual activity: Yes    Birth control/ protection: Surgical   Other Topics Concern  . None   Social History Narrative  . None   Hospital Course:  Patient was admitted on 10-04-16 after presenting to [facility] ED for AVH with commands to hurt herself and others. Patient was admitted to the [facility] and was restarted on medications. Patient stated that Abilify has worked the best for her in the past and patient was gradually increased to 10 mg PO Daily and is being transitioned to Abilify Maintena 400 mg IM before discharge. Patient was attending groups regularly and continued appropriate conversations and thoughts with all involved with her treatment. She will be following -up with outpatient services and plans to continue her Abilify Maintena and return to work.   Physical Findings: AIMS: Facial and Oral Movements Muscles of Facial Expression: None, normal Lips and Perioral Area: None, normal Jaw: None, normal Tongue: None, normal,Extremity Movements Upper (arms, wrists, hands, fingers): None, normal Lower (legs, knees, ankles, toes): None, normal, Trunk Movements Neck, shoulders, hips: None, normal, Overall Severity Severity of abnormal movements (highest score from questions above): None, normal Incapacitation due to abnormal movements: None,  normal Patient's awareness of abnormal movements (rate only patient's report): No Awareness, Dental Status Current problems with teeth and/or dentures?: No Does patient usually wear dentures?: No  CIWA:    COWS:     Musculoskeletal: Strength & Muscle Tone: within normal limits Gait & Station: normal Patient leans: N/A  Psychiatric Specialty Exam: Physical Exam  ROS  Blood pressure 111/68, pulse 82, temperature 98.5 F (36.9 C), resp. rate 16,  height 5\' 1"  (1.549 m), weight 56.2 kg (124 lb), last menstrual period 04/12/2011.Body mass index is 23.43 kg/m.   See SRA     Have you used any form of tobacco in the last 30 days? (Cigarettes, Smokeless Tobacco, Cigars, and/or Pipes): No  Has this patient used any form of tobacco in the last 30 days? (Cigarettes, Smokeless Tobacco, Cigars, and/or Pipes) No  Blood Alcohol level:  Lab Results  Component Value Date   ETH <5 10/04/2016   ETH <5 11/23/2014    Metabolic Disorder Labs:  Lab Results  Component Value Date   HGBA1C 4.7 (L) 10/06/2016   MPG 88 10/06/2016   MPG 97 11/26/2014   Lab Results  Component Value Date   PROLACTIN 68.7 (H) 11/26/2014   Lab Results  Component Value Date   CHOL 195 10/06/2016   TRIG 118 10/06/2016   HDL 45 10/06/2016   CHOLHDL 4.3 10/06/2016   VLDL 24 10/06/2016   LDLCALC 126 (H) 10/06/2016   LDLCALC 113 (H) 11/26/2014    See Psychiatric Specialty Exam and Suicide Risk Assessment completed by Attending Physician prior to discharge.  Discharge destination:  Home  Is patient on multiple antipsychotic therapies at discharge:  No   Has Patient had three or more failed trials of antipsychotic monotherapy by history:  No  Recommended Plan for Multiple Antipsychotic Therapies: NA   Allergies as of 10/07/2016      Reactions   Bactrim Anaphylaxis, Itching, Swelling   Blue Dyes (parenteral) Anaphylaxis, Shortness Of Breath   Codeine Itching, Swelling   Hallucinations Pt can take vicodin with no reaction   Penicillins Anaphylaxis, Itching, Swelling   Has patient had a PCN reaction causing immediate rash, facial/tongue/throat swelling, SOB or lightheadedness with hypotension: Yes Has patient had a PCN reaction causing severe rash involving mucus membranes or skin necrosis: Yes Has patient had a PCN reaction that required hospitalization: Yes Has patient had a PCN reaction occurring within the last 10 years: No If all of the above answers  are "NO", then may proceed with Cephalosporin use.   Aspirin Swelling   Ibuprofen Other (See Comments)   Lowers hemoglobin - pt has G6PD deficiency      Medication List    STOP taking these medications   albuterol 108 (90 Base) MCG/ACT inhaler Commonly known as:  PROVENTIL HFA;VENTOLIN HFA   ARIPiprazole 10 MG tablet Commonly known as:  ABILIFY Replaced by:  ARIPiprazole ER 400 MG Srer   ARIPiprazole ER 400 MG Susr Replaced by:  ARIPiprazole 10 MG tablet   benztropine 1 MG tablet Commonly known as:  COGENTIN   FISH OIL PO   hydrOXYzine 25 MG tablet Commonly known as:  ATARAX/VISTARIL   IRON PO     TAKE these medications     Indication  ARIPiprazole 10 MG tablet Commonly known as:  ABILIFY Take 1 tablet (10 mg total) by mouth at bedtime. For mood stabilization Replaces:  ARIPiprazole ER 400 MG Susr  Indication:  Mixed Bipolar Affective Disorder   ARIPiprazole ER 400 MG Srer Inject  400 mg into the muscle every 30 (thirty) days. (Next injection due 11-06-16) for Mood Control Replaces:  ARIPiprazole 10 MG tablet  Indication:  Mixed Bipolar Affective Disorder   lamoTRIgine 25 MG tablet Commonly known as:  LAMICTAL Take 1 tablet (25 mg total) by mouth daily. For mood stabilization What changed:  additional instructions  Indication:  Manic-Depression   nitrofurantoin (macrocrystal-monohydrate) 100 MG capsule Commonly known as:  MACROBID Take 1 capsule (100 mg total) by mouth every 12 (twelve) hours. For urinary tract infection  Indication:  Urinary Tract Infection   traZODone 50 MG tablet Commonly known as:  DESYREL Take 1 tablet (50 mg total) by mouth at bedtime as needed for sleep.  Indication:  Trouble Sleeping      Follow-up Information    BEHAVIORAL HEALTH OUTPATIENT THERAPY [place] Follow up on 10/12/2016.   Specialty:  Behavioral Health Why:  Hospital discharge follow up appointment w Dr Ladona Ridgel on 7/18 at 1 PM.  Please arrive by 12 Noon to complete  paperwork.  Call to cancel/reschedule if needed.  Bring insurance card and photo ID to this appointment.   Contact information: [address] [id number] mc [place] [address] [phone]          Follow-up recommendations: Continue activity as tolerated. Continue diet as recommended by your PCP. Ensure to keep all appointments with outpatient providers.  Comments:  Patient is instructed prior to discharge to: Take all medications as prescribed by his/her mental healthcare provider. Report any adverse effects and or reactions from the medicines to his/her outpatient provider promptly. Patient has been instructed & cautioned: To not engage in alcohol and or illegal drug use while on prescription medicines. In the event of worsening symptoms, patient is instructed to call the crisis hotline, 911 and or go to the nearest ED for appropriate evaluation and treatment of symptoms. To follow-up with his/her primary care provider for your other medical issues, concerns and or health care needs.    Signed: Gerlene Burdock Jaelynne Hockley, FNP 10/07/2016, 2:34 PM

## 2016-10-07 NOTE — Progress Notes (Signed)
Recreation Therapy Notes  10/07/2016 approximately 10:50am Recreation Therapy Intern asked pt to identify 5 coping skills she could use post d/c for anger management. Pt identified using a stress ball, praying, singing, dancing and playing or talking with her children. Recreation Therapy Intern wrote down these coping skills on a piece of paper with anger management written on the top. Pt stated she would hang up this paper in a place she could see every day.  Donovan Kail, Recreation Therapy Intern  Victorino Sparrow, LRT/CTRS   Donovan Kail 10/07/2016 11:39 AM

## 2016-10-07 NOTE — BHH Suicide Risk Assessment (Signed)
[facility] Suicide Risk Assessment   Principal Problem: Schizoaffective disorder, mixed type [facility]) Discharge Diagnoses:  Patient Active Problem List   Diagnosis Date Noted  . Marijuana abuse [F12.10] 10/05/2016    Priority: High  . Schizoaffective disorder, mixed type ([place]) [F25.0] 11/24/2014    Priority: High  . Paranoid ideation ([place]) [F22] 10/04/2016  . Hyperprolactinemia ([place]) [E22.1] 11/28/2014  . Fibroid uterus [D25.9] 05/12/2011  . Pelvic pain [R10.2] 05/12/2011  Patient Is a 41 year old female transferred from [place] long ED for stabilization and treatment of visual and auditory hallucinations along with commands to hurt herself and others.  Patient reports that she is doing fairly well, received her Abilify maintain on this morning, adds that she's done well on it in the past. She states that she is excited about going home, reports her church is supportive. She denies any hallucinations, as that she's attended all groups, is doing much better. She denies any thoughts of hurting herself or others, any paranoia, any side effects of the medications.  Total Time spent with patient: 30 minutes  Musculoskeletal: Strength & Muscle Tone: within normal limits Gait & Station: normal Patient leans: N/A  Psychiatric Specialty Exam: Review of Systems  Constitutional: Negative.  Negative for fever and malaise/fatigue.  HENT: Negative.  Negative for congestion and sore throat.   Eyes: Negative.  Negative for blurred vision, double vision, discharge and redness.  Respiratory: Negative.  Negative for cough and shortness of breath.   Cardiovascular: Negative.  Negative for chest pain and palpitations.  Gastrointestinal: Negative.  Negative for abdominal pain, constipation, diarrhea, heartburn, nausea and vomiting.  Genitourinary: Negative.  Negative for dysuria.  Musculoskeletal: Negative for falls, joint pain and myalgias.  Neurological: Negative for weakness.   Psychiatric/Behavioral: Negative.  Negative for depression, hallucinations, substance abuse and suicidal ideas. The patient is not nervous/anxious and does not have insomnia.     Blood pressure 111/68, pulse 82, temperature 98.5 F (36.9 C), resp. rate 16, height 5\' 1"  (1.549 m), weight 124 lb (56.2 kg), last menstrual period 04/12/2011.Body mass index is 23.43 kg/m.  General Appearance: Casual  Eye Contact::  Fair  Speech:  Clear and Coherent and Normal Rate409  Volume:  Normal  Mood:  Euthymic  Affect:  Congruent and Full Range  Thought Process:  Coherent, Goal Directed and Descriptions of Associations: Intact  Orientation:  Full (Time, Place, and Person)  Thought Content:  WDL  Suicidal Thoughts:  No  Homicidal Thoughts:  No  Memory:  Immediate;   Fair Recent;   Fair Remote;   Fair  Judgement:  Intact  Insight:  Fair  Psychomotor Activity:  Normal  Concentration:  Fair  Recall:  [employer] [place]  Language: Fair  Akathisia:  No  Handed:  Right  AIMS (if indicated):     Assets:  Desire for Improvement Housing Physical Health Social Support  Sleep:  Number of Hours: 5.25  Cognition: WNL  ADL's:  Intact   Mental Status Per Nursing Assessment::   On Admission:  NA  Demographic Factors:  Low socioeconomic status  Loss Factors: Financial problems/change in socioeconomic status  Historical Factors: Family history of mental illness or substance abuse  Risk Reduction Factors:   Sense of responsibility to family, Living with another person, especially a relative and Positive social support  Continued Clinical Symptoms:  Previous Psychiatric Diagnoses and Treatments  Cognitive Features That Contribute To Risk:  None    Suicide Risk:  Minimal: No identifiable suicidal ideation.  Patients  presenting with no risk factors but with morbid ruminations; may be classified as minimal risk based on the severity of the depressive symptoms  Follow-up [place] Follow up on 10/12/2016.   Specialty:  Behavioral Health Why:  Hospital discharge follow up appointment w Dr Lovena Le on 7/18 at 1 PM.  Please arrive by 12 Noon to complete paperwork.  Call to cancel/reschedule if needed.  Bring insurance card and photo ID to this appointment.   Contact information: [place] [id number] [place] [phone]          Plan Of [place] toleratedollow-up recommendations:  Activity:  as tolerated Diet:  regular Other:  Keep follow up appointments and take medications as prescribed  Hampton Abbot, MD 10/07/2016, 11:27 AM

## 2016-10-07 NOTE — Plan of Care (Signed)
Problem: BHH Participation in Recreation Therapeutic Interventions Goal: STG-Patient will identify at least five coping skills for ** STG: Coping Skills - Patient will be able to identify at least 5 coping skills for anger management  by conclusion of recreation therapy tx  Outcome: Completed/Met Date Met: 10/07/16 Pt successfully identified 5 coping skills to use for anger management: using a stress ball, praying, singing, dancing and playing or talking with her children.   

## 2016-10-12 ENCOUNTER — Ambulatory Visit (HOSPITAL_COMMUNITY): Payer: Self-pay | Admitting: Psychiatry

## 2016-11-01 ENCOUNTER — Encounter (HOSPITAL_COMMUNITY): Payer: Self-pay | Admitting: Emergency Medicine

## 2016-11-01 ENCOUNTER — Emergency Department (HOSPITAL_COMMUNITY)
Admission: EM | Admit: 2016-11-01 | Discharge: 2016-11-01 | Disposition: A | Discharge status: Home / Self Care | Payer: Medicare HMO | Attending: Emergency Medicine | Admitting: Emergency Medicine

## 2016-11-01 ENCOUNTER — Emergency Department (HOSPITAL_COMMUNITY): Payer: Medicare HMO

## 2016-11-01 DIAGNOSIS — Z79899 Other long term (current) drug therapy: Secondary | ICD-10-CM | POA: Insufficient documentation

## 2016-11-01 DIAGNOSIS — R0781 Pleurodynia: Secondary | ICD-10-CM | POA: Diagnosis not present

## 2016-11-01 DIAGNOSIS — J45909 Unspecified asthma, uncomplicated: Secondary | ICD-10-CM | POA: Insufficient documentation

## 2016-11-01 DIAGNOSIS — Z87891 Personal history of nicotine dependence: Secondary | ICD-10-CM | POA: Diagnosis not present

## 2016-11-01 DIAGNOSIS — R079 Chest pain, unspecified: Secondary | ICD-10-CM

## 2016-11-01 DIAGNOSIS — R091 Pleurisy: Secondary | ICD-10-CM | POA: Diagnosis not present

## 2016-11-01 LAB — POCT I-STAT TROPONIN I: TROPONIN I, POC: 0 ng/mL (ref 0.00–0.08)

## 2016-11-01 LAB — BASIC METABOLIC PANEL
ANION GAP: 9 (ref 5–15)
BUN: 13 mg/dL (ref 6–20)
CO2: 28 mmol/L (ref 22–32)
Calcium: 9.4 mg/dL (ref 8.9–10.3)
Chloride: 105 mmol/L (ref 101–111)
Creatinine, Ser: 0.82 mg/dL (ref 0.44–1.00)
GFR calc Af Amer: >60 mL/min (ref >60)
GLUCOSE: 56 mg/dL — AB (ref 65–99)
Potassium: 3.5 mmol/L (ref 3.5–5.1)
Sodium: 142 mmol/L (ref 135–145)

## 2016-11-01 LAB — CBC
HCT: 36.1 % (ref 36.0–46.0)
HEMOGLOBIN: 12.1 g/dL (ref 12.0–15.0)
MCH: 32.1 pg (ref 26.0–34.0)
MCHC: 33.5 g/dL (ref 30.0–36.0)
MCV: 95.8 fL (ref 78.0–100.0)
PLATELETS: 301 10*3/uL (ref 150–400)
RBC: 3.77 MIL/uL — ABNORMAL LOW (ref 3.87–5.11)
RDW: 12.8 % (ref 11.5–15.5)
WBC: 11 10*3/uL — ABNORMAL HIGH (ref 4.0–10.5)

## 2016-11-01 MED ORDER — TRAMADOL HCL 50 MG PO TABS: 50.0000 mg | ORAL_TABLET | Freq: Four times a day (QID) | ORAL | 0 refills | Status: DC | PRN

## 2016-11-01 NOTE — ED Triage Notes (Signed)
Pt c/o progressive constant pulling right chest pain worse with inspiration onset last night, palpation, and movement of right shoulder. No SOB or dizziness. Hx schizophrenia.

## 2016-11-01 NOTE — ED Provider Notes (Signed)
[facility] DEPT Provider Note   CSN: [id number] Arrival date & time: 11/01/16  1119     History   Chief Complaint Chief Complaint  Patient presents with  . Chest Pain    HPI Valerie Lee is a 41 y.o. female.Chief complaint is chest pain  HPI 41 year old female. Reports pain in her right chest by her collarbone since last Wednesday. She states it hurts to touch on. Hurts to move her arm. Hurts to shrug her shoulder. She's had no fall or injury. She works at [employer] as a Tourist information centre manager. No cough although she when she does cough it causes discomfort. No sputum or hemoptysis. No fever or shortness of breath.  Past Medical History:  Diagnosis Date  . Anemia   . Anxiety   . Asthma    rarely uses albuterol rescue  . Bipolar 1 disorder ([place])   . Blood transfusion   . G6PD (glucose 6 phosphatase deficiency)   . G6PD deficiency anemia ([place])   . Mental disorder   . Paranoia ([place])   . Schizophrenia ([place])   . Schizophrenia [facility])     Patient Active Problem List   Diagnosis Date Noted  . Marijuana abuse 10/05/2016  . Paranoid ideation ([place]) 10/04/2016  . Hyperprolactinemia ([place]) 11/28/2014  . Schizoaffective disorder, mixed type ([place]) 11/24/2014  . Fibroid uterus 05/12/2011  . Pelvic pain 05/12/2011    Past Surgical History:  Procedure Laterality Date  . ABDOMINAL HYSTERECTOMY  05/26/2011   Procedure: HYSTERECTOMY ABDOMINAL;  Surgeon: Mora Bellman, MD;  Location: [place] [facility];  Service: Gynecology;  Laterality: N/A;  . CHOLECYSTECTOMY  2011  . TUBAL LIGATION    . TUBAL LIGATION      OB History    Gravida Para Term Preterm AB Living   6 6 5 1  0 6   SAB TAB Ectopic Multiple Live Births   0 0 0 0         Home Medications    Prior to Admission medications   Medication Sig Start Date End Date Taking? Authorizing Provider  ARIPiprazole (ABILIFY) 10 MG tablet Take 1 tablet (10 mg total) by mouth at bedtime. For mood stabilization 10/07/16  Yes Money, Lowry Ram, FNP    ARIPiprazole ER 400 MG SRER Inject 400 mg into the muscle every 30 (thirty) days. (Next injection due 11-06-16) for Mood Control 11/06/16  Yes Money, Lowry Ram, FNP  lamoTRIgine (LAMICTAL) 25 MG tablet Take 1 tablet (25 mg total) by mouth daily. For mood stabilization 10/08/16  Yes Money, Lowry Ram, FNP  lurasidone (LATUDA) 40 MG TABS tablet Take 40 mg by mouth daily with breakfast.   Yes [provider]  traZODone (DESYREL) 50 MG tablet Take 1 tablet (50 mg total) by mouth at bedtime as needed for sleep. 10/07/16  Yes Money, Lowry Ram, FNP  nitrofurantoin, macrocrystal-monohydrate, (MACROBID) 100 MG capsule Take 1 capsule (100 mg total) by mouth every 12 (twelve) hours. For urinary tract infection Patient not taking: Reported on 11/01/2016 10/07/16   Money, Lowry Ram, FNP  traMADol (ULTRAM) 50 MG tablet Take 1 tablet (50 mg total) by mouth every 6 (six) hours as needed. 11/01/16   Tanna Furry, MD    Family History Family History  Problem Relation Age of Onset  . Hypertension Mother     Social History Social History  Substance Use Topics  . Smoking status: Former Smoker    Packs/day: 1.50    Years: 24.00    Types: Cigarettes  . Smokeless tobacco: Never  Used  . Alcohol use No     Allergies   Bactrim; Blue dyes (parenteral); Codeine; Penicillins; Aspirin; and Ibuprofen   Review of Systems Review of Systems  Constitutional: Negative for appetite change, chills, diaphoresis, fatigue and fever.  HENT: Negative for mouth sores, sore throat and trouble swallowing.   Eyes: Negative for visual disturbance.  Respiratory: Negative for cough, chest tightness, shortness of breath and wheezing.   Cardiovascular: Positive for chest pain.  Gastrointestinal: Negative for abdominal distention, abdominal pain, diarrhea, nausea and vomiting.  Endocrine: Negative for polydipsia, polyphagia and polyuria.  Genitourinary: Negative for dysuria, frequency and hematuria.  Musculoskeletal: Negative for  gait problem.  Skin: Negative for color change, pallor and rash.  Neurological: Negative for dizziness, syncope, light-headedness and headaches.  Hematological: Does not bruise/bleed easily.  Psychiatric/Behavioral: Negative for behavioral problems and confusion.     Physical Exam Updated Vital Signs BP 112/63 (BP Location: Left Arm)   Pulse 70   Temp 98.9 F (37.2 C) (Oral)   Resp (!) 31   Ht 5\' 1"  (1.549 m)   Wt 61.2 kg (135 lb)   LMP 04/12/2011   SpO2 99%   BMI 25.51 kg/m   Physical Exam  Constitutional: She is oriented to person, place, and time. She appears well-developed and well-nourished. No distress.  HENT:  Head: Normocephalic.  Eyes: Pupils are equal, round, and reactive to light. Conjunctivae are normal. No scleral icterus.  Neck: Normal range of motion. Neck supple. No thyromegaly present.  Cardiovascular: Normal rate and regular rhythm.  Exam reveals no gallop and no friction rub.   No murmur heard. Pulmonary/Chest: Effort normal and breath sounds normal. No respiratory distress. She has no wheezes. She has no rales.    Abdominal: Soft. Bowel sounds are normal. She exhibits no distension. There is no tenderness. There is no rebound.  Musculoskeletal: Normal range of motion.  Neurological: She is alert and oriented to person, place, and time.  Skin: Skin is warm and dry. No rash noted.  Psychiatric: She has a normal mood and affect. Her behavior is normal.     ED Treatments / Results  Labs (all labs ordered are listed, but only abnormal results are displayed) Labs Reviewed  BASIC METABOLIC PANEL - Abnormal; Notable for the following:       Result Value   Glucose, Bld 56 (*)    All other components within normal limits  CBC - Abnormal; Notable for the following:    WBC 11.0 (*)    RBC 3.77 (*)    All other components within normal limits  I-STAT TROPONIN, ED  POCT I-STAT TROPONIN I    EKG  EKG Interpretation None       Radiology Dg Chest 2  View  Result Date: 11/01/2016 CLINICAL DATA:  Right-sided chest pain. EXAM: CHEST  2 VIEW COMPARISON:  02/12/2015 FINDINGS: The lungs are clear without focal pneumonia, edema, pneumothorax or pleural effusion. Cardiopericardial silhouette is at upper limits of normal for size. The visualized bony structures of the thorax are intact. IMPRESSION: No active cardiopulmonary disease. Electronically Signed   By: Misty Stanley M.D.   On: 11/01/2016 12:29    Procedures Procedures (including critical care time)  Medications Ordered in ED Medications - No data to display   Initial Impression / Assessment and Plan / ED Course  I have reviewed the triage vital signs and the nursing notes.  Pertinent labs & imaging results that were available during my care of the patient were  reviewed by me and considered in my medical decision making (see chart for details).     Patient with reproducible right chest wall pain and tenderness. Does not look septic or toxic. No leukocytosis or fever. Doubt myositis or angiitis. Normal appearance the arm without swelling, ecchymosis, or sign of infection or streaking. Normal EKG. No chest x-ray remotely. Normal labs troponin. No leukocytosis. Her likely chest wall pain or pleurisy. She has allergy to anti-inflammatories. Plan short course Bactrim. Plain Tylenol as needed. ER with any changes including fever, shortness of breath, or other new or worsening symptoms.  Final Clinical Impressions(s) / ED Diagnoses   Final diagnoses:  Chest pain, unspecified type  Pleurisy    New Prescriptions New Prescriptions   TRAMADOL (ULTRAM) 50 MG TABLET    Take 1 tablet (50 mg total) by mouth every 6 (six) hours as needed.     Tanna Furry, MD 11/01/16 1332

## 2016-11-01 NOTE — ED Notes (Signed)
Patient ambulated to treatment room from lobby

## 2016-11-01 NOTE — ED Notes (Signed)
Pharmacy Tech at bedside  

## 2016-11-01 NOTE — Discharge Instructions (Signed)
Tylenol for pain. Ultram for pain not relieved with tylenol.

## 2016-11-01 NOTE — ED Notes (Signed)
Patient requesting discharge papers due to her ride here and she needs to leave.   Awaiting Dr Jeneen Rinks to sign patient's prescription, so I can go over discharge instructions and prescriptions with patient.

## 2016-11-23 ENCOUNTER — Ambulatory Visit (HOSPITAL_COMMUNITY): Payer: Medicare HMO | Admitting: Psychiatry

## 2017-05-09 ENCOUNTER — Emergency Department (HOSPITAL_COMMUNITY)
Admission: EM | Admit: 2017-05-09 | Discharge: 2017-05-09 | Disposition: A | Discharge status: Home / Self Care | Payer: Medicare HMO | Attending: Emergency Medicine | Admitting: Emergency Medicine

## 2017-05-09 ENCOUNTER — Encounter (HOSPITAL_COMMUNITY): Payer: Self-pay

## 2017-05-09 ENCOUNTER — Other Ambulatory Visit: Payer: Self-pay

## 2017-05-09 ENCOUNTER — Emergency Department (HOSPITAL_COMMUNITY): Payer: Medicare HMO

## 2017-05-09 DIAGNOSIS — Z87891 Personal history of nicotine dependence: Secondary | ICD-10-CM | POA: Diagnosis not present

## 2017-05-09 DIAGNOSIS — Z79899 Other long term (current) drug therapy: Secondary | ICD-10-CM | POA: Diagnosis not present

## 2017-05-09 DIAGNOSIS — R9431 Abnormal electrocardiogram [ECG] [EKG]: Secondary | ICD-10-CM | POA: Diagnosis not present

## 2017-05-09 DIAGNOSIS — R69 Illness, unspecified: Secondary | ICD-10-CM

## 2017-05-09 DIAGNOSIS — M7918 Myalgia, other site: Secondary | ICD-10-CM | POA: Diagnosis not present

## 2017-05-09 DIAGNOSIS — R112 Nausea with vomiting, unspecified: Secondary | ICD-10-CM | POA: Diagnosis not present

## 2017-05-09 DIAGNOSIS — R079 Chest pain, unspecified: Secondary | ICD-10-CM | POA: Diagnosis not present

## 2017-05-09 DIAGNOSIS — E860 Dehydration: Secondary | ICD-10-CM | POA: Diagnosis not present

## 2017-05-09 DIAGNOSIS — J111 Influenza due to unidentified influenza virus with other respiratory manifestations: Secondary | ICD-10-CM

## 2017-05-09 DIAGNOSIS — R51 Headache: Secondary | ICD-10-CM | POA: Insufficient documentation

## 2017-05-09 DIAGNOSIS — M549 Dorsalgia, unspecified: Secondary | ICD-10-CM | POA: Insufficient documentation

## 2017-05-09 DIAGNOSIS — R05 Cough: Secondary | ICD-10-CM | POA: Insufficient documentation

## 2017-05-09 DIAGNOSIS — R1084 Generalized abdominal pain: Secondary | ICD-10-CM | POA: Diagnosis present

## 2017-05-09 DIAGNOSIS — M542 Cervicalgia: Secondary | ICD-10-CM | POA: Diagnosis not present

## 2017-05-09 LAB — I-STAT BETA HCG BLOOD, ED (MC, WL, AP ONLY)

## 2017-05-09 LAB — CBC
HCT: 38 % (ref 36.0–46.0)
HEMOGLOBIN: 12.6 g/dL (ref 12.0–15.0)
MCH: 32.7 pg (ref 26.0–34.0)
MCHC: 33.2 g/dL (ref 30.0–36.0)
MCV: 98.7 fL (ref 78.0–100.0)
Platelets: 290 10*3/uL (ref 150–400)
RBC: 3.85 MIL/uL — AB (ref 3.87–5.11)
RDW: 13.2 % (ref 11.5–15.5)
WBC: 12.4 10*3/uL — ABNORMAL HIGH (ref 4.0–10.5)

## 2017-05-09 LAB — URINALYSIS, ROUTINE W REFLEX MICROSCOPIC
Bilirubin Urine: NEGATIVE
Glucose, UA: NEGATIVE mg/dL
KETONES UR: NEGATIVE mg/dL
Nitrite: NEGATIVE
PROTEIN: NEGATIVE mg/dL
Specific Gravity, Urine: 1.02 (ref 1.005–1.030)
pH: 6 (ref 5.0–8.0)

## 2017-05-09 LAB — COMPREHENSIVE METABOLIC PANEL
ALBUMIN: 4 g/dL (ref 3.5–5.0)
ALT: 21 U/L (ref 14–54)
ANION GAP: 10 (ref 5–15)
AST: 31 U/L (ref 15–41)
Alkaline Phosphatase: 60 U/L (ref 38–126)
BUN: 10 mg/dL (ref 6–20)
CHLORIDE: 106 mmol/L (ref 101–111)
CO2: 23 mmol/L (ref 22–32)
Calcium: 9 mg/dL (ref 8.9–10.3)
Creatinine, Ser: 0.77 mg/dL (ref 0.44–1.00)
GFR calc non Af Amer: >60 mL/min (ref >60)
GLUCOSE: 83 mg/dL (ref 65–99)
Potassium: 3.7 mmol/L (ref 3.5–5.1)
SODIUM: 139 mmol/L (ref 135–145)
Total Bilirubin: 0.6 mg/dL (ref 0.3–1.2)
Total Protein: 8.5 g/dL — ABNORMAL HIGH (ref 6.5–8.1)

## 2017-05-09 LAB — LIPASE, BLOOD: LIPASE: 32 U/L (ref 11–51)

## 2017-05-09 MED ORDER — PROCHLORPERAZINE EDISYLATE 5 MG/ML IJ SOLN
10.0000 mg | Freq: Once | INTRAMUSCULAR | Status: AC
  Administered 2017-05-09: 10 mg via INTRAVENOUS
  Filled 2017-05-09: qty 2

## 2017-05-09 MED ORDER — DIPHENHYDRAMINE HCL 50 MG/ML IJ SOLN
25.0000 mg | Freq: Once | INTRAMUSCULAR | Status: AC
  Administered 2017-05-09: 25 mg via INTRAVENOUS
  Filled 2017-05-09: qty 1

## 2017-05-09 MED ORDER — OSELTAMIVIR PHOSPHATE 75 MG PO CAPS: 75.0000 mg | ORAL_CAPSULE | Freq: Two times a day (BID) | ORAL | 0 refills | Status: AC

## 2017-05-09 MED ORDER — DICYCLOMINE HCL 20 MG PO TABS: 20.0000 mg | ORAL_TABLET | Freq: Three times a day (TID) | ORAL | 0 refills | Status: DC | PRN

## 2017-05-09 MED ORDER — ONDANSETRON 4 MG PO TBDP: 4.0000 mg | ORAL_TABLET | Freq: Three times a day (TID) | ORAL | 0 refills | Status: DC | PRN

## 2017-05-09 MED ORDER — OSELTAMIVIR PHOSPHATE 75 MG PO CAPS
75.0000 mg | ORAL_CAPSULE | Freq: Once | ORAL | Status: AC
  Administered 2017-05-09: 75 mg via ORAL
  Filled 2017-05-09 (×2): qty 1

## 2017-05-09 MED ORDER — ACETAMINOPHEN 500 MG PO TABS
1000.0000 mg | ORAL_TABLET | Freq: Once | ORAL | Status: AC
  Administered 2017-05-09: 1000 mg via ORAL
  Filled 2017-05-09: qty 2

## 2017-05-09 MED ORDER — SODIUM CHLORIDE 0.9 % IV BOLUS (SEPSIS)
1000.0000 mL | Freq: Once | INTRAVENOUS | Status: AC
  Administered 2017-05-09: 1000 mL via INTRAVENOUS

## 2017-05-09 MED ORDER — MORPHINE SULFATE (PF) 4 MG/ML IV SOLN
4.0000 mg | Freq: Once | INTRAVENOUS | Status: DC
  Filled 2017-05-09: qty 1

## 2017-05-09 NOTE — ED Provider Notes (Signed)
[place] DEPT Provider Note   CSN: [id number] Arrival date & time: 05/09/17  1211     History   Chief Complaint Chief Complaint  Patient presents with  . Diarrhea  . Emesis  . Cough    HPI Valerie Lee is a 42 y.o. female.  HPI   42 year old female with past medical history of G6PD deficiency, bipolar disorder, here with diffuse body aches and multiple complaints.  Patient states her symptoms started approximately 2 and half days ago.  She reports gradual onset of progressively worsening diffuse body aches and diffuse neck and back pain.  The symptoms began fairly acutely.  She has associated cough, productive of yellow-green sputum, as well as nausea and poor appetite.  She had several episodes of emesis and one episode of loose diarrhea.  This occurs in the setting of recent sick contacts with positive influenza, and her daughter.  She denies any known fevers but has had chills.  She reports generalized fatigue and diffuse body aches.  She had a mild headache but her back pain is her major complaint along her bilateral paraspinal muscles of her neck and throughout her back.  Denies any falls.  No numbness or weakness.  No loss of bowel or bladder function.  No ongoing abdominal pain, but does report intermittent abdominal cramping.  Past Medical History:  Diagnosis Date  . Anemia   . Anxiety   . Asthma    rarely uses albuterol rescue  . Bipolar 1 disorder ([place])   . Blood transfusion   . G6PD (glucose 6 phosphatase deficiency)   . G6PD deficiency anemia ([place])   . Mental disorder   . Paranoia ([place])   . Schizophrenia ([place])   . Schizophrenia [facility])     Patient Active Problem List   Diagnosis Date Noted  . Marijuana abuse 10/05/2016  . Paranoid ideation ([place]) 10/04/2016  . Hyperprolactinemia ([place]) 11/28/2014  . Schizoaffective disorder, mixed type ([place]) 11/24/2014  . Fibroid uterus 05/12/2011  . Pelvic pain 05/12/2011    Past  Surgical History:  Procedure Laterality Date  . ABDOMINAL HYSTERECTOMY  05/26/2011   Procedure: HYSTERECTOMY ABDOMINAL;  Surgeon: Mora Bellman, MD;  Location: [facility];  Service: Gynecology;  Laterality: N/A;  . CHOLECYSTECTOMY  2011  . TUBAL LIGATION    . TUBAL LIGATION      OB History    Gravida Para Term Preterm AB Living   6 6 5 1  0 6   SAB TAB Ectopic Multiple Live Births   0 0 0 0         Home Medications    Prior to Admission medications   Medication Sig Start Date End Date Taking? Authorizing Provider  ARIPiprazole (ABILIFY) 10 MG tablet Take 1 tablet (10 mg total) by mouth at bedtime. For mood stabilization 10/07/16   Money, Darnelle Maffucci B, FNP  ARIPiprazole ER 400 MG SRER Inject 400 mg into the muscle every 30 (thirty) days. (Next injection due 11-06-16) for Mood Control 11/06/16   Money, Lowry Ram, FNP  dicyclomine (BENTYL) 20 MG tablet Take 1 tablet (20 mg total) by mouth 3 (three) times daily as needed for spasms. 05/09/17   Duffy Bruce, MD  lamoTRIgine (LAMICTAL) 25 MG tablet Take 1 tablet (25 mg total) by mouth daily. For mood stabilization 10/08/16   Money, Darnelle Maffucci B, FNP  lurasidone (LATUDA) 40 MG TABS tablet Take 40 mg by mouth daily with breakfast.    [provider]  nitrofurantoin, macrocrystal-monohydrate, (MACROBID)  100 MG capsule Take 1 capsule (100 mg total) by mouth every 12 (twelve) hours. For urinary tract infection Patient not taking: Reported on 11/01/2016 10/07/16   Money, Lowry Ram, FNP  ondansetron (ZOFRAN ODT) 4 MG disintegrating tablet Take 1 tablet (4 mg total) by mouth every 8 (eight) hours as needed for nausea or vomiting. 05/09/17   Duffy Bruce, MD  oseltamivir (TAMIFLU) 75 MG capsule Take 1 capsule (75 mg total) by mouth every 12 (twelve) hours for 5 days. 05/09/17 05/14/17  Duffy Bruce, MD  traMADol (ULTRAM) 50 MG tablet Take 1 tablet (50 mg total) by mouth every 6 (six) hours as needed. 11/01/16   Tanna Furry, MD  traZODone (DESYREL) 50 MG  tablet Take 1 tablet (50 mg total) by mouth at bedtime as needed for sleep. 10/07/16   Money, Lowry Ram, FNP    Family History Family History  Problem Relation Age of Onset  . Hypertension Mother     Social History Social History   Tobacco Use  . Smoking status: Former Smoker    Packs/day: 1.50    Years: 24.00    Pack years: 36.00    Types: Cigarettes  . Smokeless tobacco: Never Used  Substance Use Topics  . Alcohol use: No  . Drug use: No     Allergies   Bactrim; Blue dyes (parenteral); Codeine; Penicillins; Aspirin; and Ibuprofen   Review of Systems Review of Systems  Constitutional: Positive for fatigue.  HENT: Positive for congestion.   Respiratory: Positive for cough and shortness of breath.   Cardiovascular: Positive for chest pain.  Gastrointestinal: Positive for abdominal pain, diarrhea, nausea and vomiting.  Musculoskeletal: Positive for arthralgias and myalgias.  Neurological: Positive for weakness and headaches.  All other systems reviewed and are negative.    Physical Exam Updated Vital Signs BP 120/65 (BP Location: Left Arm)   Pulse 80   Temp 98.7 F (37.1 C) (Oral)   Resp 20   Ht 4\' 11"  (1.499 m)   Wt 61.2 kg (135 lb)   LMP 04/12/2011   SpO2 98%   BMI 27.27 kg/m   Physical Exam  Constitutional: She is oriented to person, place, and time. She appears well-developed and well-nourished. She appears distressed.  Tearful, distressed, moaning in pain when examiner in room but appears comfortable when examiner is not in room  HENT:  Head: Normocephalic and atraumatic.  Eyes: Conjunctivae are normal.  Neck: Neck supple.  Cardiovascular: Normal rate, regular rhythm and normal heart sounds. Exam reveals no friction rub.  No murmur heard. Pulmonary/Chest: Effort normal and breath sounds normal. No respiratory distress. She has no wheezes. She has no rales.  Abdominal: Soft. She exhibits no distension. There is no tenderness.  Musculoskeletal: She  exhibits no edema.  Jumps and exquisite reported pain with even attempted palpation throughout the entire spine, chest, and bilateral lower extremities.  When distracted, there is minimal neck stiffness or rigidity.  She is looking around the room without difficulty.  There is no apparent joint effusions or swelling of the bilateral upper and lower extremities.  Neurological: She is alert and oriented to person, place, and time. She exhibits normal muscle tone.  Skin: Skin is warm. Capillary refill takes less than 2 seconds.  Psychiatric: She has a normal mood and affect.  Nursing note and vitals reviewed.    ED Treatments / Results  Labs (all labs ordered are listed, but only abnormal results are displayed) Labs Reviewed  COMPREHENSIVE METABOLIC PANEL - Abnormal; Notable  for the following components:      Result Value   Total Protein 8.5 (*)    All other components within normal limits  CBC - Abnormal; Notable for the following components:   WBC 12.4 (*)    RBC 3.85 (*)    All other components within normal limits  URINALYSIS, ROUTINE W REFLEX MICROSCOPIC - Abnormal; Notable for the following components:   Hgb urine dipstick MODERATE (*)    Leukocytes, UA TRACE (*)    Bacteria, UA FEW (*)    Squamous Epithelial / LPF 0-5 (*)    All other components within normal limits  LIPASE, BLOOD  I-STAT BETA HCG BLOOD, ED ([facility], [facility], [facility] ONLY)    EKG  EKG Interpretation  Date/Time:  Tuesday May 09 2017 18:15:27 EST Ventricular Rate:  77 PR Interval:    QRS Duration: 78 QT Interval:  354 QTC Calculation: 401 R Axis:   84 Text Interpretation:  Sinus rhythm No significant change since last tracing Confirmed by Duffy Bruce [phone]) on 05/09/2017 7:08:10 PM       Radiology Dg Chest 2 View  Result Date: 05/09/2017 CLINICAL DATA:  Cough and weakness EXAM: CHEST  2 VIEW COMPARISON:  11/01/2016 FINDINGS: Heart size mildly enlarged, unchanged. Vascularity normal. Lungs remain clear  without infiltrate or effusion. IMPRESSION: No active cardiopulmonary disease. Electronically Signed   By: Franchot Gallo M.D.   On: 05/09/2017 19:30    Procedures Procedures (including critical care time)  Medications Ordered in ED Medications  sodium chloride 0.9 % bolus 1,000 mL (0 mLs Intravenous Stopped 05/09/17 1954)  acetaminophen (TYLENOL) tablet 1,000 mg (1,000 mg Oral Given 05/09/17 1850)  prochlorperazine (COMPAZINE) injection 10 mg (10 mg Intravenous Given 05/09/17 1850)  diphenhydrAMINE (BENADRYL) injection 25 mg (25 mg Intravenous Given 05/09/17 1850)  oseltamivir (TAMIFLU) capsule 75 mg (75 mg Oral Given 05/09/17 1955)     Initial Impression / Assessment and Plan / ED Course  I have reviewed the triage vital signs and the nursing notes.  Pertinent labs & imaging results that were available during my care of the patient were reviewed by me and considered in my medical decision making (see chart for details).     42 year old female here with diffuse myalgias, subjective fevers, and multiple complaints.  I suspect the patient symptoms are due to influenza-like illness.  She has a known sick contact with influenza.  Her pain is diffuse and seems muscular in etiology.  She has no meningismus or neck stiffness or fever here to suggest meningitis or encephalitis.  She is alert and oriented.  Lab work is overall very reassuring.  She has a mild leukocytosis but this is baseline.  CMP unremarkable.  Urinalysis without evidence of UTI.  She is on her period.  UPT negative.  Chest x-ray clear without pneumonia.  On repeat exam, the patient has no headache, no neck stiffness, no symptoms of ongoing bacterial meningitis.  She has no abdominal tenderness on serial exams.  Given patient's reassuring exam, labs, and known influenza contact, will treat empirically with influenza and give supportive care.  We discussed strict return precautions.  Work note provided.  Final Clinical Impressions(s)  / ED Diagnoses   Final diagnoses:  Influenza-like illness  Dehydration    ED Discharge Orders        Ordered    oseltamivir (TAMIFLU) 75 MG capsule  Every 12 hours     05/09/17 2029    ondansetron (ZOFRAN ODT) 4 MG disintegrating tablet  Every 8 hours PRN     05/09/17 2029    dicyclomine (BENTYL) 20 MG tablet  3 times daily PRN     05/09/17 2029       Duffy Bruce, MD 05/09/17 2029

## 2017-05-09 NOTE — ED Triage Notes (Signed)
Patient c/o N/V/D, generalized abdominal pain, and a productive cough with green sputum since yesterday. Patient states she has burning in her abdomen after eating.

## 2017-05-09 NOTE — ED Notes (Signed)
EKG delayed. EDP at bedside.

## 2018-01-13 ENCOUNTER — Encounter (HOSPITAL_COMMUNITY): Payer: Self-pay | Admitting: *Deleted

## 2018-01-13 ENCOUNTER — Emergency Department (HOSPITAL_COMMUNITY): Payer: Medicare HMO

## 2018-01-13 ENCOUNTER — Other Ambulatory Visit: Payer: Self-pay

## 2018-01-13 ENCOUNTER — Emergency Department (HOSPITAL_COMMUNITY)
Admission: EM | Admit: 2018-01-13 | Discharge: 2018-01-13 | Disposition: A | Discharge status: Home / Self Care | Payer: Medicare HMO | Attending: Emergency Medicine | Admitting: Emergency Medicine

## 2018-01-13 DIAGNOSIS — N390 Urinary tract infection, site not specified: Secondary | ICD-10-CM

## 2018-01-13 DIAGNOSIS — R05 Cough: Secondary | ICD-10-CM | POA: Insufficient documentation

## 2018-01-13 DIAGNOSIS — M791 Myalgia, unspecified site: Secondary | ICD-10-CM | POA: Diagnosis present

## 2018-01-13 DIAGNOSIS — J45909 Unspecified asthma, uncomplicated: Secondary | ICD-10-CM | POA: Insufficient documentation

## 2018-01-13 DIAGNOSIS — R197 Diarrhea, unspecified: Secondary | ICD-10-CM | POA: Diagnosis not present

## 2018-01-13 DIAGNOSIS — R1110 Vomiting, unspecified: Secondary | ICD-10-CM | POA: Diagnosis not present

## 2018-01-13 DIAGNOSIS — Z79899 Other long term (current) drug therapy: Secondary | ICD-10-CM | POA: Insufficient documentation

## 2018-01-13 DIAGNOSIS — R6889 Other general symptoms and signs: Secondary | ICD-10-CM

## 2018-01-13 DIAGNOSIS — J1189 Influenza due to unidentified influenza virus with other manifestations: Secondary | ICD-10-CM | POA: Insufficient documentation

## 2018-01-13 DIAGNOSIS — R1084 Generalized abdominal pain: Secondary | ICD-10-CM | POA: Insufficient documentation

## 2018-01-13 DIAGNOSIS — J111 Influenza due to unidentified influenza virus with other respiratory manifestations: Secondary | ICD-10-CM | POA: Diagnosis not present

## 2018-01-13 LAB — COMPREHENSIVE METABOLIC PANEL
ALT: 11 U/L (ref 0–44)
AST: 16 U/L (ref 15–41)
Albumin: 3.8 g/dL (ref 3.5–5.0)
Alkaline Phosphatase: 46 U/L (ref 38–126)
Anion gap: 9 (ref 5–15)
BILIRUBIN TOTAL: 0.8 mg/dL (ref 0.3–1.2)
BUN: 11 mg/dL (ref 6–20)
CO2: 23 mmol/L (ref 22–32)
CREATININE: 0.74 mg/dL (ref 0.44–1.00)
Calcium: 8.7 mg/dL — ABNORMAL LOW (ref 8.9–10.3)
Chloride: 104 mmol/L (ref 98–111)
GFR calc non Af Amer: >60 mL/min (ref >60)
Glucose, Bld: 108 mg/dL — ABNORMAL HIGH (ref 70–99)
Potassium: 3.3 mmol/L — ABNORMAL LOW (ref 3.5–5.1)
Sodium: 136 mmol/L (ref 135–145)
TOTAL PROTEIN: 7.5 g/dL (ref 6.5–8.1)

## 2018-01-13 LAB — URINALYSIS, ROUTINE W REFLEX MICROSCOPIC
Bilirubin Urine: NEGATIVE
GLUCOSE, UA: NEGATIVE mg/dL
Ketones, ur: NEGATIVE mg/dL
Nitrite: NEGATIVE
PH: 5 (ref 5.0–8.0)
Protein, ur: NEGATIVE mg/dL
SPECIFIC GRAVITY, URINE: 1.021 (ref 1.005–1.030)

## 2018-01-13 LAB — CBC
HCT: 39 % (ref 36.0–46.0)
Hemoglobin: 12.8 g/dL (ref 12.0–15.0)
MCH: 31.9 pg (ref 26.0–34.0)
MCHC: 32.8 g/dL (ref 30.0–36.0)
MCV: 97.3 fL (ref 80.0–100.0)
NRBC: 0 % (ref 0.0–0.2)
PLATELETS: 320 10*3/uL (ref 150–400)
RBC: 4.01 MIL/uL (ref 3.87–5.11)
RDW: 12.2 % (ref 11.5–15.5)
WBC: 12.6 10*3/uL — AB (ref 4.0–10.5)

## 2018-01-13 LAB — LIPASE, BLOOD: Lipase: 127 U/L — ABNORMAL HIGH (ref 11–51)

## 2018-01-13 MED ORDER — PROMETHAZINE HCL 25 MG PO TABS: 25.0000 mg | ORAL_TABLET | Freq: Four times a day (QID) | ORAL | 0 refills | Status: DC | PRN

## 2018-01-13 MED ORDER — SODIUM CHLORIDE 0.9 % IV SOLN
1.0000 g | Freq: Once | INTRAVENOUS | Status: AC
  Administered 2018-01-13: 1 g via INTRAVENOUS
  Filled 2018-01-13: qty 10

## 2018-01-13 MED ORDER — SODIUM CHLORIDE 0.9 % IJ SOLN
INTRAMUSCULAR | Status: AC
  Administered 2018-01-13: 10:00:00
  Filled 2018-01-13: qty 50

## 2018-01-13 MED ORDER — ACETAMINOPHEN 500 MG PO TABS: 500.0000 mg | ORAL_TABLET | Freq: Four times a day (QID) | ORAL | 0 refills | Status: DC | PRN

## 2018-01-13 MED ORDER — IOHEXOL 300 MG/ML  SOLN
100.0000 mL | Freq: Once | INTRAMUSCULAR | Status: AC | PRN
  Administered 2018-01-13: 75 mL via INTRAVENOUS

## 2018-01-13 MED ORDER — ONDANSETRON HCL 4 MG/2ML IJ SOLN
4.0000 mg | Freq: Once | INTRAMUSCULAR | Status: AC
  Administered 2018-01-13: 4 mg via INTRAVENOUS
  Filled 2018-01-13: qty 2

## 2018-01-13 MED ORDER — MORPHINE SULFATE (PF) 4 MG/ML IV SOLN
4.0000 mg | Freq: Once | INTRAVENOUS | Status: AC
  Administered 2018-01-13: 4 mg via INTRAVENOUS
  Filled 2018-01-13: qty 1

## 2018-01-13 MED ORDER — DOXYCYCLINE HYCLATE 100 MG PO CAPS: 100.0000 mg | ORAL_CAPSULE | Freq: Two times a day (BID) | ORAL | 0 refills | Status: DC

## 2018-01-13 MED ORDER — HYDROMORPHONE HCL 1 MG/ML IJ SOLN
1.0000 mg | Freq: Once | INTRAMUSCULAR | Status: AC
  Administered 2018-01-13: 1 mg via INTRAVENOUS
  Filled 2018-01-13: qty 1

## 2018-01-13 MED ORDER — SODIUM CHLORIDE 0.9 % IV BOLUS
1000.0000 mL | Freq: Once | INTRAVENOUS | Status: AC
  Administered 2018-01-13: 1000 mL via INTRAVENOUS

## 2018-01-13 NOTE — ED Notes (Signed)
She has just told me she had a pain flare. She describes it as "It hurts all over my belly". Additional pain med. Given. She is aware she is awaiting CT.

## 2018-01-13 NOTE — ED Provider Notes (Signed)
[place] DEPT Provider Note   CSN: [id number] Arrival date & time: 01/13/18  0251     History   Chief Complaint Chief Complaint  Patient presents with  . Generalized Body Aches    HPI Valerie Lee is a 42 y.o. female.  The history is provided by the patient and medical records. No language interpreter was used.     42 year old female with history of schizophrenia, anxiety, presenting with multiple complaints.  Since 4 AM yesterday, patient developed generalized body aches, feeling nauseous, vomiting, feeling lightheadedness and dizzy, having burning sensation when urinate, complaining of diffuse abdominal pain which she described as a sharp throbbing burning sensation of moderate to severe in intensity and also having diarrhea.  She also noticed several red spots that raised from her arm that is painful.  She cannot keep anything down.  She also mention that her grand son is having similar symptoms.  She endorsed occasional nonproductive cough.  She denies any significant headache, no other URI symptoms, no blood in the urine.  She denies alcohol or tobacco abuse.  She denies history of diabetes.  Aside from taking naproxen at home no other specific treatment tried   Past Medical History:  Diagnosis Date  . Anemia   . Anxiety   . Asthma    rarely uses albuterol rescue  . Bipolar 1 disorder ([place])   . Blood transfusion   . G6PD (glucose 6 phosphatase deficiency)   . G6PD deficiency anemia ([place])   . Mental disorder   . Paranoia ([place])   . Schizophrenia ([place])   . Schizophrenia [facility])     Patient Active Problem List   Diagnosis Date Noted  . Marijuana abuse 10/05/2016  . Paranoid ideation ([place]) 10/04/2016  . Hyperprolactinemia ([place]) 11/28/2014  . Schizoaffective disorder, mixed type ([place]) 11/24/2014  . Fibroid uterus 05/12/2011  . Pelvic pain 05/12/2011    Past Surgical History:  Procedure Laterality Date  . ABDOMINAL HYSTERECTOMY   05/26/2011   Procedure: HYSTERECTOMY ABDOMINAL;  Surgeon: Mora Bellman, MD;  Location: [facility];  Service: Gynecology;  Laterality: N/A;  . CHOLECYSTECTOMY  2011  . TUBAL LIGATION    . TUBAL LIGATION       OB History    Gravida  6   Para  6   Term  5   Preterm  1   AB  0   Living  6     SAB  0   TAB  0   Ectopic  0   Multiple  0   Live Births               Home Medications    Prior to Admission medications   Medication Sig Start Date End Date Taking? Authorizing Provider  ARIPiprazole (ABILIFY) 10 MG tablet Take 1 tablet (10 mg total) by mouth at bedtime. For mood stabilization 10/07/16   Money, Darnelle Maffucci B, FNP  ARIPiprazole ER 400 MG SRER Inject 400 mg into the muscle every 30 (thirty) days. (Next injection due 11-06-16) for Mood Control 11/06/16   Money, Lowry Ram, FNP  dicyclomine (BENTYL) 20 MG tablet Take 1 tablet (20 mg total) by mouth 3 (three) times daily as needed for spasms. 05/09/17   Duffy Bruce, MD  lamoTRIgine (LAMICTAL) 25 MG tablet Take 1 tablet (25 mg total) by mouth daily. For mood stabilization 10/08/16   Money, Darnelle Maffucci B, FNP  lurasidone (LATUDA) 40 MG TABS tablet Take 40 mg by mouth daily  with breakfast.    [provider]  nitrofurantoin, macrocrystal-monohydrate, (MACROBID) 100 MG capsule Take 1 capsule (100 mg total) by mouth every 12 (twelve) hours. For urinary tract infection Patient not taking: Reported on 11/01/2016 10/07/16   Money, Lowry Ram, FNP  ondansetron (ZOFRAN ODT) 4 MG disintegrating tablet Take 1 tablet (4 mg total) by mouth every 8 (eight) hours as needed for nausea or vomiting. 05/09/17   Duffy Bruce, MD  traMADol (ULTRAM) 50 MG tablet Take 1 tablet (50 mg total) by mouth every 6 (six) hours as needed. 11/01/16   Tanna Furry, MD  traZODone (DESYREL) 50 MG tablet Take 1 tablet (50 mg total) by mouth at bedtime as needed for sleep. 10/07/16   Money, Lowry Ram, FNP    Family History Family History  Problem Relation Age of  Onset  . Hypertension Mother     Social History Social History   Tobacco Use  . Smoking status: Former Smoker    Packs/day: 1.50    Years: 24.00    Pack years: 36.00    Types: Cigarettes  . Smokeless tobacco: Never Used  Substance Use Topics  . Alcohol use: No  . Drug use: No     Allergies   Bactrim; Blue dyes (parenteral); Codeine; Penicillins; Aspirin; and Ibuprofen   Review of Systems Review of Systems  All other systems reviewed and are negative.    Physical Exam Updated Vital Signs BP 126/77 (BP Location: Right Arm)   Pulse (!) 104   Temp 98.9 F (37.2 C) (Oral)   Resp 16   Ht 4\' 9"  (1.448 m)   Wt 61.2 kg   LMP 04/12/2011   SpO2 96%   BMI 29.21 kg/m   Physical Exam  Constitutional: She appears well-developed and well-nourished. No distress.  Patient appears uncomfortable but nontoxic in appearance  HENT:  Head: Atraumatic.  Mouth/Throat: Oropharynx is clear and moist.  Eyes: Conjunctivae are normal.  Neck: Normal range of motion. Neck supple.  Cardiovascular:  Mild tachycardia without murmur rubs or gallop  Pulmonary/Chest: Effort normal and breath sounds normal.  Abdominal: Soft. She exhibits no distension. There is tenderness (Diffuse abdominal tenderness with guarding).  Musculoskeletal: She exhibits tenderness (Tenderness throughout body with gentle palpation.).  Neurological: She is alert.  Skin: No rash noted.  2 small macular erythematous lesion noted on left forearm  Psychiatric: She has a normal mood and affect.  Nursing note and vitals reviewed.    ED Treatments / Results  Labs (all labs ordered are listed, but only abnormal results are displayed) Labs Reviewed  LIPASE, BLOOD - Abnormal; Notable for the following components:      Result Value   Lipase 127 (*)    All other components within normal limits  COMPREHENSIVE METABOLIC PANEL - Abnormal; Notable for the following components:   Potassium 3.3 (*)    Glucose, Bld 108 (*)      Calcium 8.7 (*)    All other components within normal limits  CBC - Abnormal; Notable for the following components:   WBC 12.6 (*)    All other components within normal limits  URINALYSIS, ROUTINE W REFLEX MICROSCOPIC - Abnormal; Notable for the following components:   Hgb urine dipstick MODERATE (*)    Leukocytes, UA TRACE (*)    Bacteria, UA MANY (*)    All other components within normal limits    EKG None  Radiology Ct Abdomen Pelvis W Contrast  Result Date: 01/13/2018 CLINICAL DATA:  42 year old female with  a history of body aches and vomiting with diarrhea EXAM: CT ABDOMEN AND PELVIS WITH CONTRAST TECHNIQUE: Multidetector CT imaging of the abdomen and pelvis was performed using the standard protocol following bolus administration of intravenous contrast. CONTRAST:  50mL OMNIPAQUE IOHEXOL 300 MG/ML  SOLN COMPARISON:  11/29/2013 FINDINGS: Lower chest: No acute abnormality. Hepatobiliary: Unremarkable appearance of liver. Cholecystectomy. Unchanged appearance of the mild prominence of the intrahepatic biliary ducts and extrahepatic biliary duct with no radiopaque stones. Pancreas: Unremarkable pancreas Spleen: Unremarkable spleen Adrenals/Urinary Tract: Unremarkable adrenal glands. Unremarkable appearance of the kidneys. No hydronephrosis or nephrolithiasis. Unremarkable course the bilateral ureters. Unremarkable urinary bladder. Stomach/Bowel: Unremarkable stomach. Unremarkable small bowel. Normal appendix. Moderate stool burden. No transition point. No focal wall thickening of small bowel or colon. No inflammatory changes within the adjacent fat. Vascular/Lymphatic: No significant vascular calcifications. No adenopathy. Reproductive: Unremarkable appearance of the adnexa.  Hysterectomy. Other: No hernia.  No inguinal adenopathy. Musculoskeletal: No acute displaced fracture. IMPRESSION: No acute CT finding to account for the patient's reported symptoms. Electronically Signed   By: Corrie Mckusick D.O.   On: 01/13/2018 08:57    Procedures Procedures (including critical care time)  Medications Ordered in ED Medications  cefTRIAXone (ROCEPHIN) 1 g in sodium chloride 0.9 % 100 mL IVPB (0 g Intravenous Stopped 01/13/18 0734)  sodium chloride 0.9 % bolus 1,000 mL (0 mLs Intravenous Stopped 01/13/18 0951)  morphine 4 MG/ML injection 4 mg (4 mg Intravenous Given 01/13/18 0651)  ondansetron (ZOFRAN) injection 4 mg (4 mg Intravenous Given 01/13/18 0651)  HYDROmorphone (DILAUDID) injection 1 mg (1 mg Intravenous Given 01/13/18 0737)  iohexol (OMNIPAQUE) 300 MG/ML solution 100 mL (75 mLs Intravenous Contrast Given 01/13/18 0823)  sodium chloride 0.9 % injection (  Given by Other 01/13/18 0935)     Initial Impression / Assessment and Plan / ED Course  I have reviewed the triage vital signs and the nursing notes.  Pertinent labs & imaging results that were available during my care of the patient were reviewed by me and considered in my medical decision making (see chart for details).     BP 109/66 (BP Location: Right Arm)   Pulse 70   Temp 98.9 F (37.2 C) (Oral)   Resp 15   Ht 4\' 9"  (1.448 m)   Wt 61.2 kg   LMP 04/12/2011   SpO2 98%   BMI 29.21 kg/m    Final Clinical Impressions(s) / ED Diagnoses   Final diagnoses:  Acute lower UTI  Flu-like symptoms    ED Discharge Orders         Ordered    doxycycline (VIBRAMYCIN) 100 MG capsule  2 times daily     01/13/18 0927    promethazine (PHENERGAN) 25 MG tablet  Every 6 hours PRN     01/13/18 0927    acetaminophen (TYLENOL) 500 MG tablet  Every 6 hours PRN     01/13/18 0927         6:46 AM Patient presents with myalgias, dysuria, and abdominal pain.  She has prior hysterectomy.  Urine shows moderate hemoglobin and urine dipsticks, 6-10 WBC and many bacteria.  Lipase is mildly elevated at 127.  Mild leukocytosis with WBC 12.6.  We will give IV fluid, pain medication, antinausea medication.  We will also obtain  abdominal and pelvic CT scan for further evaluation.  Patient given Rocephin for suspect pyelonephritis.   Domenic Moras, PA-C 01/14/18 1275    Varney Biles, MD 01/15/18 [phone]

## 2018-01-13 NOTE — ED Triage Notes (Signed)
Pt reports generalized body aches, vomiting, and diarrhea since yesterday.

## 2018-02-09 ENCOUNTER — Emergency Department (HOSPITAL_COMMUNITY): Payer: Medicare HMO

## 2018-02-09 ENCOUNTER — Other Ambulatory Visit: Payer: Self-pay

## 2018-02-09 ENCOUNTER — Encounter (HOSPITAL_COMMUNITY): Payer: Self-pay

## 2018-02-09 ENCOUNTER — Emergency Department (HOSPITAL_COMMUNITY)
Admission: EM | Admit: 2018-02-09 | Discharge: 2018-02-09 | Disposition: A | Discharge status: Home / Self Care | Payer: Medicare HMO | Attending: Emergency Medicine | Admitting: Emergency Medicine

## 2018-02-09 DIAGNOSIS — J45909 Unspecified asthma, uncomplicated: Secondary | ICD-10-CM | POA: Diagnosis not present

## 2018-02-09 DIAGNOSIS — Z79899 Other long term (current) drug therapy: Secondary | ICD-10-CM | POA: Insufficient documentation

## 2018-02-09 DIAGNOSIS — F209 Schizophrenia, unspecified: Secondary | ICD-10-CM | POA: Insufficient documentation

## 2018-02-09 DIAGNOSIS — M79602 Pain in left arm: Secondary | ICD-10-CM | POA: Diagnosis not present

## 2018-02-09 DIAGNOSIS — F1210 Cannabis abuse, uncomplicated: Secondary | ICD-10-CM | POA: Insufficient documentation

## 2018-02-09 DIAGNOSIS — F17210 Nicotine dependence, cigarettes, uncomplicated: Secondary | ICD-10-CM | POA: Insufficient documentation

## 2018-02-09 DIAGNOSIS — M25512 Pain in left shoulder: Secondary | ICD-10-CM | POA: Diagnosis not present

## 2018-02-09 DIAGNOSIS — R079 Chest pain, unspecified: Secondary | ICD-10-CM | POA: Diagnosis not present

## 2018-02-09 DIAGNOSIS — F319 Bipolar disorder, unspecified: Secondary | ICD-10-CM | POA: Diagnosis not present

## 2018-02-09 DIAGNOSIS — Z9049 Acquired absence of other specified parts of digestive tract: Secondary | ICD-10-CM | POA: Diagnosis not present

## 2018-02-09 DIAGNOSIS — F419 Anxiety disorder, unspecified: Secondary | ICD-10-CM | POA: Diagnosis not present

## 2018-02-09 DIAGNOSIS — M25522 Pain in left elbow: Secondary | ICD-10-CM | POA: Diagnosis not present

## 2018-02-09 DIAGNOSIS — M79642 Pain in left hand: Secondary | ICD-10-CM | POA: Diagnosis not present

## 2018-02-09 MED ORDER — NAPROXEN 500 MG PO TABS: 500.0000 mg | ORAL_TABLET | Freq: Two times a day (BID) | ORAL | 0 refills | Status: AC

## 2018-02-09 MED ORDER — ACETAMINOPHEN 325 MG PO TABS
650.0000 mg | ORAL_TABLET | Freq: Once | ORAL | Status: AC
  Administered 2018-02-09: 650 mg via ORAL
  Filled 2018-02-09: qty 2

## 2018-02-09 NOTE — ED Notes (Signed)
PMS in tact on left extremity

## 2018-02-09 NOTE — ED Provider Notes (Signed)
[facility] DEPT Provider Note   CSN: [id number] Arrival date & time: 02/09/18  5956     History   Chief Complaint Chief Complaint  Patient presents with  . Shoulder Pain  . Arm Pain    HPI Valerie Lee is a 42 y.o. female.  42 y.o female with a PMH of Schizophrenia, G6PD, Anxiety presents to the ED with a chief complaint of left arm pain x 4 days. Patient describes her pain as pulling beginning at the elbow and radiating down her arm. She also reports numbness and tingling along her fingertips. She also reports chest pain which she describes as pulling mainly located on the left chest. She states the pain is worse with movement and there is no alleviating symptoms. She has tried ice, hot pack but states no relieve in symptoms. She has not tried any oral medication as she reports "I have no money for the naproxen". She denies any fever, shortness of breath, abdominal pain or trauma.      Past Medical History:  Diagnosis Date  . Anemia   . Anxiety   . Asthma    rarely uses albuterol rescue  . Bipolar 1 disorder ([place])   . Blood transfusion   . G6PD (glucose 6 phosphatase deficiency)   . 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 Mental disorder   . Paranoia ([place])   . Schizophrenia ([place])   . Schizophrenia [facility])     Patient Active Problem List   Diagnosis Date Noted  . Marijuana abuse 10/05/2016  . Paranoid ideation ([place]) 10/04/2016  . Hyperprolactinemia ([place]) 11/28/2014  . Schizoaffective disorder, mixed type ([place]) 11/24/2014  . Fibroid uterus 05/12/2011  . Pelvic pain 05/12/2011    Past Surgical History:  Procedure Laterality Date  . ABDOMINAL HYSTERECTOMY  05/26/2011   Procedure: HYSTERECTOMY ABDOMINAL;  Surgeon: Mora Bellman, MD;  Location: [facility];  Service: Gynecology;  Laterality: N/A;  . CHOLECYSTECTOMY  2011  . TUBAL LIGATION    . TUBAL LIGATION       OB History    Gravida  6   Para  6   Term  5   Preterm  1   AB    0   Living  6     SAB  0   TAB  0   Ectopic  0   Multiple  0   Live Births               Home Medications    Prior to Admission medications   Medication Sig Start Date End Date Taking? Authorizing Provider  ARIPiprazole (ABILIFY) 10 MG tablet Take 1 tablet (10 mg total) by mouth at bedtime. For mood stabilization 10/07/16  Yes Money, Lowry Ram, FNP  lamoTRIgine (LAMICTAL) 25 MG tablet Take 1 tablet (25 mg total) by mouth daily. For mood stabilization 10/08/16  Yes Money, Lowry Ram, FNP  lurasidone (LATUDA) 40 MG TABS tablet Take 40 mg by mouth daily with breakfast.   Yes [provider]  ondansetron (ZOFRAN ODT) 4 MG disintegrating tablet Take 1 tablet (4 mg total) by mouth every 8 (eight) hours as needed for nausea or vomiting. 05/09/17  Yes Duffy Bruce, MD  traZODone (DESYREL) 50 MG tablet Take 1 tablet (50 mg total) by mouth at bedtime as needed for sleep. 10/07/16  Yes Money, Lowry Ram, FNP  acetaminophen (TYLENOL) 500 MG tablet Take 1 tablet (500 mg total) by mouth every 6 (six) hours as needed. Patient not  taking: Reported on 02/09/2018 01/13/18   Domenic Moras, PA-C  dicyclomine (BENTYL) 20 MG tablet Take 1 tablet (20 mg total) by mouth 3 (three) times daily as needed for spasms. Patient not taking: Reported on 02/09/2018 05/09/17   Duffy Bruce, MD  doxycycline (VIBRAMYCIN) 100 MG capsule Take 1 capsule (100 mg total) by mouth 2 (two) times daily. One po bid x 7 days Patient not taking: Reported on 02/09/2018 01/13/18   Domenic Moras, PA-C  naproxen (NAPROSYN) 500 MG tablet Take 1 tablet (500 mg total) by mouth 2 (two) times daily for 7 days. 02/09/18 02/16/18  Janeece Fitting, PA-C  promethazine (PHENERGAN) 25 MG tablet Take 1 tablet (25 mg total) by mouth every 6 (six) hours as needed for nausea. Patient not taking: Reported on 02/09/2018 01/13/18   Domenic Moras, PA-C  traMADol (ULTRAM) 50 MG tablet Take 1 tablet (50 mg total) by mouth every 6 (six) hours as  needed. Patient not taking: Reported on 02/09/2018 11/01/16   Tanna Furry, MD    Family History Family History  Problem Relation Age of Onset  . Hypertension Mother     Social History Social History   Tobacco Use  . Smoking status: Current Every Day Smoker    Packs/day: 0.25    Years: 24.00    Pack years: 6.00    Types: Cigarettes  . Smokeless tobacco: Never Used  Substance Use Topics  . Alcohol use: No  . Drug use: No     Allergies   Bactrim; Blue dyes (parenteral); Codeine; Penicillins; Aspirin; and Ibuprofen   Review of Systems Review of Systems  Constitutional: Negative for fever.  HENT: Negative for sore throat.   Respiratory: Negative for shortness of breath.   Cardiovascular: Positive for chest pain.  Gastrointestinal: Negative for abdominal pain.  Genitourinary: Negative for dysuria and flank pain.  Musculoskeletal: Positive for arthralgias and myalgias.  Skin: Negative for pallor and wound.  Neurological: Negative for light-headedness and headaches.     Physical Exam Updated Vital Signs BP (!) 102/46 (BP Location: Right Arm)   Pulse 84   Temp 98.2 F (36.8 C) (Oral)   Resp 16   Ht 4\' 9"  (1.448 m)   Wt 61.2 kg   LMP 04/12/2011   SpO2 98%   BMI 29.20 kg/m   Physical Exam  Constitutional: She is oriented to person, place, and time. She appears well-developed and well-nourished. No distress.  HENT:  Head: Normocephalic and atraumatic.  Mouth/Throat: Oropharynx is clear and moist. No oropharyngeal exudate.  Eyes: Pupils are equal, round, and reactive to light.  Neck: Normal range of motion.  Cardiovascular: Regular rhythm and normal heart sounds.  Pulmonary/Chest: Effort normal and breath sounds normal. No respiratory distress.  Abdominal: Soft. Bowel sounds are normal. She exhibits no distension. There is no tenderness.  Musculoskeletal: She exhibits tenderness. She exhibits no deformity.       Left shoulder: She exhibits pain. She exhibits no  swelling, normal pulse and normal strength.       Left elbow: Tenderness found.       Left wrist: She exhibits tenderness. She exhibits no swelling, no effusion, no crepitus and no deformity.       Right lower leg: She exhibits no edema.       Left lower leg: She exhibits no edema.  Pulses present, capillary refill intact. 4/5 strength due to pain.   Neurological: She is alert and oriented to person, place, and time.  Skin: Skin is warm and  dry.  Psychiatric: She has a normal mood and affect.  Nursing note and vitals reviewed.    ED Treatments / Results  Labs (all labs ordered are listed, but only abnormal results are displayed) Labs Reviewed - No data to display  EKG EKG Interpretation  Date/Time:  Friday February 09 2018 19:58:40 EST Ventricular Rate:  74 PR Interval:    QRS Duration: 84 QT Interval:  369 QTC Calculation: 410 R Axis:   34 Text Interpretation:  Sinus rhythm No significant change since last tracing Confirmed by Lajean Saver [phone]) on 02/09/2018 8:00:36 PM   Radiology Dg Elbow 2 Views Left  Result Date: 02/09/2018 CLINICAL DATA:  Left elbow pain EXAM: LEFT ELBOW - 2 VIEW COMPARISON:  None. FINDINGS: There is no evidence of fracture, dislocation, or joint effusion. There is mild mineralization at the triceps tendon insertion which may reflect mild calcific tendinosis. There is no evidence of arthropathy or other focal bone abnormality. Soft tissues are unremarkable. IMPRESSION: No acute osseous injury of the left elbow. Electronically Signed   By: Kathreen Devoid   On: 02/09/2018 20:21   Dg Hand 2 View Left  Result Date: 02/09/2018 CLINICAL DATA:  42 year old female with left upper extremity pain since yesterday. EXAM: LEFT HAND - 2 VIEW COMPARISON:  None. FINDINGS: Bone mineralization is within normal limits. There is no evidence of fracture or dislocation. There is no evidence of arthropathy or other focal bone abnormality. Soft tissues are unremarkable.  IMPRESSION: Negative. Electronically Signed   By: Genevie Ann M.D.   On: 02/09/2018 20:22   Dg Shoulder Left  Result Date: 02/09/2018 CLINICAL DATA:  Left shoulder pain EXAM: LEFT SHOULDER - 2+ VIEW COMPARISON:  None. FINDINGS: There is no evidence of fracture or dislocation. There is no evidence of arthropathy or other focal bone abnormality. Soft tissues are unremarkable. IMPRESSION: Negative. Electronically Signed   By: Kathreen Devoid   On: 02/09/2018 20:20    Procedures Procedures (including critical care time)  Medications Ordered in ED Medications  acetaminophen (TYLENOL) tablet 650 mg (650 mg Oral Given 02/09/18 2001)     Initial Impression / Assessment and Plan / ED Course  I have reviewed the triage vital signs and the nursing notes.  Pertinent labs & imaging results that were available during my care of the patient were reviewed by me and considered in my medical decision making (see chart for details).    Presents with left shoulder pain which began 4 days ago.  She reports she has tried ice and heat but states no relieving symptoms.  She was previously prescribed naproxen which she has not been able to fill due to monetary issues.  He denies any fever low suspicion for septic joint.  Patient reports the pain is worse with movement.  During evaluation patient has limited range of motion due to pain, pulses are present and capillary refill is intact.  G left shoulder, elbow, hand were negative for any fracture or dislocation.  She does report she had an injury to her [facility] when she was little, she told by her mother that she cannot get surgery back then, patient reports it has gotten worse.  EKG obtained due to patient admitting to some chest pain, EKG showed no changes consistent with any infarct or STEMI.  At this time will discharge patient with a referral to orthopedist further evaluate her left shoulder pain.  Is currently requesting a work note as she works at [employer], I  will provide this for patient.  Vitals stable during ED visit, patient stable for discharge.  Final Clinical Impressions(s) / ED Diagnoses   Final diagnoses:  Left arm pain    ED Discharge Orders         Ordered    naproxen (NAPROSYN) 500 MG tablet  2 times daily     02/09/18 2109           Janeece Fitting, PA-C 02/09/18 2112    Lajean Saver, MD 02/09/18 2143

## 2018-02-09 NOTE — ED Triage Notes (Signed)
Patient c/o left shoulder pain x 4 days and left forearm pain since yesterday. Pain is worse with movement and numbness of the fingertips.

## 2018-02-09 NOTE — Discharge Instructions (Signed)
I have prescribed medication for your pain, please take as needed. I have also provided a referral to an Orthopedist please schedule an appointment to further evaluate your left shoulder pain.

## 2018-02-09 NOTE — ED Notes (Signed)
Pt stated that she was going to leave because her ride was here. This RN was in another room at that time. Pt was gone when this RN came out of the room. Pt did not receive her discharge papers or sign

## 2018-05-21 ENCOUNTER — Emergency Department (HOSPITAL_COMMUNITY): Payer: Medicare HMO

## 2018-05-21 ENCOUNTER — Other Ambulatory Visit: Payer: Self-pay

## 2018-05-21 ENCOUNTER — Encounter (HOSPITAL_COMMUNITY): Payer: Self-pay

## 2018-05-21 ENCOUNTER — Emergency Department (HOSPITAL_COMMUNITY)
Admission: EM | Admit: 2018-05-21 | Discharge: 2018-05-21 | Disposition: A | Discharge status: Home / Self Care | Payer: Medicare HMO | Attending: Emergency Medicine | Admitting: Emergency Medicine

## 2018-05-21 DIAGNOSIS — R51 Headache: Secondary | ICD-10-CM | POA: Diagnosis not present

## 2018-05-21 DIAGNOSIS — Z79899 Other long term (current) drug therapy: Secondary | ICD-10-CM | POA: Insufficient documentation

## 2018-05-21 DIAGNOSIS — R110 Nausea: Secondary | ICD-10-CM | POA: Diagnosis not present

## 2018-05-21 DIAGNOSIS — M25551 Pain in right hip: Secondary | ICD-10-CM | POA: Diagnosis not present

## 2018-05-21 DIAGNOSIS — M545 Low back pain, unspecified: Secondary | ICD-10-CM

## 2018-05-21 DIAGNOSIS — M25552 Pain in left hip: Secondary | ICD-10-CM | POA: Diagnosis not present

## 2018-05-21 DIAGNOSIS — R519 Headache, unspecified: Secondary | ICD-10-CM

## 2018-05-21 DIAGNOSIS — F17210 Nicotine dependence, cigarettes, uncomplicated: Secondary | ICD-10-CM | POA: Diagnosis not present

## 2018-05-21 DIAGNOSIS — J45909 Unspecified asthma, uncomplicated: Secondary | ICD-10-CM | POA: Insufficient documentation

## 2018-05-21 DIAGNOSIS — J069 Acute upper respiratory infection, unspecified: Secondary | ICD-10-CM

## 2018-05-21 DIAGNOSIS — R0989 Other specified symptoms and signs involving the circulatory and respiratory systems: Secondary | ICD-10-CM | POA: Insufficient documentation

## 2018-05-21 DIAGNOSIS — M542 Cervicalgia: Secondary | ICD-10-CM | POA: Diagnosis not present

## 2018-05-21 LAB — CBC WITH DIFFERENTIAL/PLATELET
ABS IMMATURE GRANULOCYTES: 0.05 10*3/uL (ref 0.00–0.07)
Basophils Absolute: 0.1 10*3/uL (ref 0.0–0.1)
Basophils Relative: 0 %
EOS ABS: 0.1 10*3/uL (ref 0.0–0.5)
Eosinophils Relative: 0 %
HEMATOCRIT: 42 % (ref 36.0–46.0)
HEMOGLOBIN: 12.9 g/dL (ref 12.0–15.0)
IMMATURE GRANULOCYTES: 0 %
LYMPHS ABS: 3.1 10*3/uL (ref 0.7–4.0)
Lymphocytes Relative: 22 %
MCH: 31.9 pg (ref 26.0–34.0)
MCHC: 30.7 g/dL (ref 30.0–36.0)
MCV: 103.7 fL — AB (ref 80.0–100.0)
Monocytes Absolute: 0.9 10*3/uL (ref 0.1–1.0)
Monocytes Relative: 6 %
NEUTROS ABS: 10.1 10*3/uL — AB (ref 1.7–7.7)
NEUTROS PCT: 72 %
Platelets: 304 10*3/uL (ref 150–400)
RBC: 4.05 MIL/uL (ref 3.87–5.11)
RDW: 12.6 % (ref 11.5–15.5)
WBC: 14.3 10*3/uL — ABNORMAL HIGH (ref 4.0–10.5)
nRBC: 0 % (ref 0.0–0.2)

## 2018-05-21 LAB — URINALYSIS, ROUTINE W REFLEX MICROSCOPIC
BILIRUBIN URINE: NEGATIVE
Glucose, UA: NEGATIVE mg/dL
KETONES UR: NEGATIVE mg/dL
LEUKOCYTE UA: NEGATIVE
Nitrite: NEGATIVE
Protein, ur: NEGATIVE mg/dL
Specific Gravity, Urine: 1.01 (ref 1.005–1.030)
pH: 6 (ref 5.0–8.0)

## 2018-05-21 LAB — BASIC METABOLIC PANEL
Anion gap: 9 (ref 5–15)
BUN: 7 mg/dL (ref 6–20)
CALCIUM: 9.3 mg/dL (ref 8.9–10.3)
CO2: 25 mmol/L (ref 22–32)
Chloride: 107 mmol/L (ref 98–111)
Creatinine, Ser: 0.83 mg/dL (ref 0.44–1.00)
GFR calc Af Amer: >60 mL/min (ref >60)
GFR calc non Af Amer: >60 mL/min (ref >60)
Glucose, Bld: 94 mg/dL (ref 70–99)
Potassium: 3.2 mmol/L — ABNORMAL LOW (ref 3.5–5.1)
Sodium: 141 mmol/L (ref 135–145)

## 2018-05-21 LAB — PREGNANCY, URINE: Preg Test, Ur: NEGATIVE

## 2018-05-21 MED ORDER — OXYCODONE-ACETAMINOPHEN 5-325 MG PO TABS
1.0000 | ORAL_TABLET | Freq: Once | ORAL | Status: AC
  Administered 2018-05-21: 1 via ORAL
  Filled 2018-05-21: qty 1

## 2018-05-21 MED ORDER — IOPAMIDOL (ISOVUE-370) INJECTION 76%
100.0000 mL | Freq: Once | INTRAVENOUS | Status: AC | PRN
  Administered 2018-05-21: 100 mL via INTRAVENOUS

## 2018-05-21 MED ORDER — MORPHINE SULFATE (PF) 4 MG/ML IV SOLN
4.0000 mg | Freq: Once | INTRAVENOUS | Status: AC
  Administered 2018-05-21: 4 mg via INTRAVENOUS
  Filled 2018-05-21: qty 1

## 2018-05-21 MED ORDER — SODIUM CHLORIDE (PF) 0.9 % IJ SOLN
INTRAMUSCULAR | Status: AC
  Filled 2018-05-21: qty 50

## 2018-05-21 MED ORDER — IOPAMIDOL (ISOVUE-370) INJECTION 76%
INTRAVENOUS | Status: AC
  Filled 2018-05-21: qty 100

## 2018-05-21 MED ORDER — TRAMADOL HCL 50 MG PO TABS: 50.0000 mg | ORAL_TABLET | Freq: Four times a day (QID) | ORAL | 0 refills | Status: DC | PRN

## 2018-05-21 NOTE — ED Notes (Signed)
Lab called and notified need for add on of urine preg

## 2018-05-21 NOTE — ED Notes (Signed)
Pt educated regarding pain medication, and is being transported home by significant other.

## 2018-05-21 NOTE — ED Provider Notes (Signed)
[place] [facility] DEPT Provider Note   CSN: [id number] Arrival date & time: 05/21/18  1104    History   Chief Complaint Chief Complaint  Patient presents with  . Back Pain  . Vaginal Discharge    HPI Valerie Lee is a 43 y.o. female who presents with neck and back pain.  Past medical history significant for schizoaffective disorder, bipolar disorder, G6PD anemia.  Patient states that she woke up last night with severe neck and back pain.  The pain goes from the base of her neck all the way to her lower back and her bilateral hips.  She has never had this pain before.  Movement makes it worse.  Nothing is made it better except for lying prone on the stretcher.  She also reports a diffuse headache with photophobia.  She states that she has not had a headache like this before either.  She went to work today and was told to come to the emergency department because she cannot do her job.  She had to keep sitting down because of the pain and being off balance.  She has not taken anything for pain.  She has had a runny nose and some nausea but otherwise no other infectious symptoms.  Specifically no fever, sore throat, cough, abdominal pain, nausea, vomiting, diarrhea, urinary symptoms.  No chest pain or shortness of breath.  She denies any IV drug use.  No bowel or bladder incontinence.  Additionally she is wondering if she can be checked for a yeast infection. Her boyfriend was told he has a yeast infection. She doesn't have any symptoms regarding this.     HPI  Past Medical History:  Diagnosis Date  . Anemia   . Anxiety   . Asthma    rarely uses albuterol rescue  . Bipolar 1 disorder ([place])   . Blood transfusion   . G6PD (glucose 6 phosphatase deficiency)   . 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 Mental disorder   . Paranoia ([place])   . Schizophrenia ([place])   . Schizophrenia [facility])     Patient Active Problem List   Diagnosis Date Noted  . Marijuana abuse  10/05/2016  . Paranoid ideation ([place]) 10/04/2016  . Hyperprolactinemia ([place]) 11/28/2014  . Schizoaffective disorder, mixed type ([place]) 11/24/2014  . Fibroid uterus 05/12/2011  . Pelvic pain 05/12/2011    Past Surgical History:  Procedure Laterality Date  . ABDOMINAL HYSTERECTOMY  05/26/2011   Procedure: HYSTERECTOMY ABDOMINAL;  Surgeon: Mora Bellman, MD;  Location: [facility];  Service: Gynecology;  Laterality: N/A;  . CHOLECYSTECTOMY  2011  . TUBAL LIGATION    . TUBAL LIGATION       OB History    Gravida  6   Para  6   Term  5   Preterm  1   AB  0   Living  6     SAB  0   TAB  0   Ectopic  0   Multiple  0   Live Births               Home Medications    Prior to Admission medications   Medication Sig Start Date End Date Taking? Authorizing Provider  ARIPiprazole (ABILIFY) 10 MG tablet Take 1 tablet (10 mg total) by mouth at bedtime. For mood stabilization 10/07/16  Yes Money, Lowry Ram, FNP  hydrOXYzine (ATARAX/VISTARIL) 25 MG tablet Take 25 mg by mouth at bedtime as needed (sleep).   Yes  [provider]  lamoTRIgine (LAMICTAL) 25 MG tablet Take 1 tablet (25 mg total) by mouth daily. For mood stabilization 10/08/16  Yes Money, Lowry Ram, FNP  lurasidone (LATUDA) 40 MG TABS tablet Take 40 mg by mouth daily with breakfast.   Yes [provider]  traZODone (DESYREL) 50 MG tablet Take 1 tablet (50 mg total) by mouth at bedtime as needed for sleep. 10/07/16  Yes Money, Lowry Ram, FNP  ondansetron (ZOFRAN ODT) 4 MG disintegrating tablet Take 1 tablet (4 mg total) by mouth every 8 (eight) hours as needed for nausea or vomiting. Patient not taking: Reported on 05/21/2018 05/09/17   Duffy Bruce, MD    Family History Family History  Problem Relation Age of Onset  . Hypertension Mother     Social History Social History   Tobacco Use  . Smoking status: Current Every Day Smoker    Packs/day: 0.25    Years: 24.00    Pack years: 6.00    Types:  Cigarettes  . Smokeless tobacco: Never Used  Substance Use Topics  . Alcohol use: No  . Drug use: No     Allergies   Bactrim; Blue dyes (parenteral); Codeine; Penicillins; Aspirin; and Ibuprofen   Review of Systems Review of Systems  Constitutional: Negative for fever.  HENT: Positive for rhinorrhea.   Respiratory: Negative for shortness of breath.   Cardiovascular: Negative for chest pain.  Gastrointestinal: Positive for nausea. Negative for abdominal pain, diarrhea and vomiting.  Musculoskeletal: Positive for back pain and myalgias. Negative for joint swelling.  Neurological: Positive for headaches. Negative for syncope, weakness and numbness.  All other systems reviewed and are negative.    Physical Exam Updated Vital Signs BP 102/64   Pulse 72   Temp 98.4 F (36.9 C) (Oral)   Resp 16   Ht 4\' 11"  (1.499 m)   Wt 61.2 kg   LMP 04/12/2011   SpO2 98%   BMI 27.27 kg/m   Physical Exam Vitals signs and nursing note reviewed.  Constitutional:      General: She is not in acute distress.    Appearance: Normal appearance. She is well-developed. She is not ill-appearing.     Comments: Lying prone on stretcher with coat over her head  HENT:     Head: Normocephalic and atraumatic.  Eyes:     General: No scleral icterus.       Right eye: No discharge.        Left eye: No discharge.     Extraocular Movements: Extraocular movements intact.     Conjunctiva/sclera: Conjunctivae normal.     Pupils: Pupils are equal, round, and reactive to light.  Neck:     Musculoskeletal: Normal range of motion. No neck rigidity.     Comments: Able to move neck with FROM. If I lightly touch her she pushes my hand away Cardiovascular:     Rate and Rhythm: Normal rate and regular rhythm.  Pulmonary:     Effort: Pulmonary effort is normal. No respiratory distress.     Breath sounds: Normal breath sounds.  Abdominal:     General: There is no distension.     Palpations: Abdomen is soft.      Tenderness: There is no abdominal tenderness.  Musculoskeletal:     Comments: Diffuse back tenderness, particularly in the lumbar region with light palpation. Pain is out of proportion to exam  5/5 lower extremity strength  Skin:    General: Skin is warm and dry.  Neurological:     Mental Status: She is alert and oriented to person, place, and time.  Psychiatric:        Behavior: Behavior normal.      ED Treatments / Results  Labs (all labs ordered are listed, but only abnormal results are displayed) Labs Reviewed  BASIC METABOLIC PANEL - Abnormal; Notable for the following components:      Result Value   Potassium 3.2 (*)    All other components within normal limits  CBC WITH DIFFERENTIAL/PLATELET - Abnormal; Notable for the following components:   WBC 14.3 (*)    MCV 103.7 (*)    Neutro Abs 10.1 (*)    All other components within normal limits  URINALYSIS, ROUTINE W REFLEX MICROSCOPIC - Abnormal; Notable for the following components:   Hgb urine dipstick SMALL (*)    Bacteria, UA RARE (*)    All other components within normal limits  PREGNANCY, URINE    EKG None  Radiology No results found.  Procedures Procedures (including critical care time)  Medications Ordered in ED Medications - No data to display   Initial Impression / Assessment and Plan / ED Course  I have reviewed the triage vital signs and the nursing notes.  Pertinent labs & imaging results that were available during my care of the patient were reviewed by me and considered in my medical decision making (see chart for details).  43 year old female presents with diffuse headache, neck, back, and bilateral hip pain for the past day. Her vitals are normal. She has diffuse neck and back tenderness on exam, which is difficult because pain is so out proportion. She is able to get up and walk and turn in the bed independently. She does not have any neck rigidity. Will give pain control and obtain labs, UA.  Discussed with attending and will add CT head, chest/abdomen/pelvis because clinical presentation is so odd.  CBC is remarkable for leukocytosis - she has a chronic leukocytosis. BMP is remarkable for mild hypokalemia (3.2). UA does not show signs of infection. CTs are negative. She has a very abnormal presentation however she had a similar presentation about 1 year ago when she was thought to have a flu like illness. She does have a runny nose. Will treat this visit similarly. She doesn't have any yeast in the urine. I do not think she needs a pelvic exam if she is asymptomatic. Pt is comfortable with this plan.   Final Clinical Impressions(s) / ED Diagnoses   Final diagnoses:  Acute nonintractable headache, unspecified headache type  Neck pain  Acute midline low back pain without sciatica  Upper respiratory tract infection, unspecified type    ED Discharge Orders    None       Recardo Evangelist, PA-C 05/21/18 1535    Gareth Morgan, MD 05/22/18 [phone]

## 2018-05-21 NOTE — Discharge Instructions (Signed)
Please drink plenty of fluids. Take cough and cold medicine as needed Take Tylenol for fever or chills Take Tramadol for severe pain Return to the ED for worsening symptoms

## 2018-05-21 NOTE — ED Triage Notes (Signed)
Pt states pain from her neck down her back since last night. Pt states she was at work and almost fell due to pain. Pt describes pain at the base of her neck that shoots down. Pt also states that she has pain in bilateral hips Pt also wants to be evaluated for yeast infection.

## 2018-07-02 DIAGNOSIS — F250 Schizoaffective disorder, bipolar type: Secondary | ICD-10-CM | POA: Diagnosis not present

## 2018-07-05 DIAGNOSIS — F250 Schizoaffective disorder, bipolar type: Secondary | ICD-10-CM | POA: Diagnosis not present

## 2018-07-24 DIAGNOSIS — F250 Schizoaffective disorder, bipolar type: Secondary | ICD-10-CM | POA: Diagnosis not present

## 2019-04-18 ENCOUNTER — Encounter (HOSPITAL_COMMUNITY): Payer: Self-pay

## 2019-04-18 ENCOUNTER — Emergency Department (HOSPITAL_COMMUNITY)
Admission: EM | Admit: 2019-04-18 | Discharge: 2019-04-18 | Disposition: A | Discharge status: Home / Self Care | Payer: Medicare HMO | Attending: Emergency Medicine | Admitting: Emergency Medicine

## 2019-04-18 ENCOUNTER — Other Ambulatory Visit: Payer: Self-pay

## 2019-04-18 DIAGNOSIS — M545 Low back pain, unspecified: Secondary | ICD-10-CM

## 2019-04-18 DIAGNOSIS — Z79899 Other long term (current) drug therapy: Secondary | ICD-10-CM | POA: Diagnosis not present

## 2019-04-18 DIAGNOSIS — F17210 Nicotine dependence, cigarettes, uncomplicated: Secondary | ICD-10-CM | POA: Diagnosis not present

## 2019-04-18 DIAGNOSIS — J45909 Unspecified asthma, uncomplicated: Secondary | ICD-10-CM | POA: Diagnosis not present

## 2019-04-18 MED ORDER — METHOCARBAMOL 500 MG PO TABS: 1000.0000 mg | ORAL_TABLET | Freq: Three times a day (TID) | ORAL | 0 refills | Status: AC

## 2019-04-18 NOTE — ED Provider Notes (Signed)
[facility] DEPT Provider Note   CSN: [id number] Arrival date & time: 04/18/19  1414     History Chief Complaint  Patient presents with  . Sciatica    Valerie Lee is a 44 y.o. female presents to the ER for evaluation of back pain.  This began 3 days ago.  Described as sharp, intermittent lasting for a few minutes down the middle of her low back.  She has radiating pain to bilateral lateral hips.  It is worse with standing up, bending and walking.  Today she went to work and the pain worsens.  She took Tylenol but that does not help.  She asked her employer if she could take a break to sit down but employer recommended evaluation before they can "accommodate" her needs.  Denies any falls.  Other than the remote epidural during labor and delivery no back procedures, surgeries or injections.  No associated fever, abdominal pain, urinary symptoms.  No saddle anesthesia, loss of sensation weakness or numbness to extremities.  No IV drug use.  Patient works at [employer], states she stands up and lift/moves objects around for 8 hours every shift.  HPI     Past Medical History:  Diagnosis Date  . Anemia   . Anxiety   . Asthma    rarely uses albuterol rescue  . Bipolar 1 disorder ([place])   . Blood transfusion   . G6PD (glucose 6 phosphatase deficiency)   . G6PD deficiency anemia ([place])   . Mental disorder   . Paranoia ([place])   . Schizophrenia ([place])   . Schizophrenia [facility])     Patient Active Problem List   Diagnosis Date Noted  . Marijuana abuse 10/05/2016  . Paranoid ideation ([place]) 10/04/2016  . Hyperprolactinemia ([place]) 11/28/2014  . Schizoaffective disorder, mixed type ([place]) 11/24/2014  . Fibroid uterus 05/12/2011  . Pelvic pain 05/12/2011    Past Surgical History:  Procedure Laterality Date  . ABDOMINAL HYSTERECTOMY  05/26/2011   Procedure: HYSTERECTOMY ABDOMINAL;  Surgeon: Mora Bellman, MD;  Location: [facility];  Service: Gynecology;   Laterality: N/A;  . CHOLECYSTECTOMY  2011  . TUBAL LIGATION    . TUBAL LIGATION       OB History    Gravida  6   Para  6   Term  5   Preterm  1   AB  0   Living  6     SAB  0   TAB  0   Ectopic  0   Multiple  0   Live Births              Family History  Problem Relation Age of Onset  . Hypertension Mother     Social History   Tobacco Use  . Smoking status: Current Every Day Smoker    Packs/day: 0.25    Years: 24.00    Pack years: 6.00    Types: Cigarettes  . Smokeless tobacco: Never Used  Substance Use Topics  . Alcohol use: No  . Drug use: No    Home Medications Prior to Admission medications   Medication Sig Start Date End Date Taking? Authorizing Provider  ARIPiprazole (ABILIFY) 10 MG tablet Take 1 tablet (10 mg total) by mouth at bedtime. For mood stabilization 10/07/16   Money, Darnelle Maffucci B, FNP  hydrOXYzine (ATARAX/VISTARIL) 25 MG tablet Take 25 mg by mouth at bedtime as needed (sleep).    [provider]  lamoTRIgine (LAMICTAL) 25 MG tablet Take 1  tablet (25 mg total) by mouth daily. For mood stabilization 10/08/16   Money, Lowry Ram, FNP  lurasidone (LATUDA) 40 MG TABS tablet Take 40 mg by mouth daily with breakfast.    [provider]  methocarbamol (ROBAXIN) 500 MG tablet Take 2 tablets (1,000 mg total) by mouth 3 (three) times daily for 7 days. 04/18/19 04/25/19  Kinnie Feil, PA-C  traMADol (ULTRAM) 50 MG tablet Take 1 tablet (50 mg total) by mouth every 6 (six) hours as needed. 05/21/18   Recardo Evangelist, PA-C  traZODone (DESYREL) 50 MG tablet Take 1 tablet (50 mg total) by mouth at bedtime as needed for sleep. 10/07/16   Money, Lowry Ram, FNP    Allergies    Bactrim, Blue dyes (parenteral), Codeine, Penicillins, Aspirin, and Ibuprofen  Review of Systems   Review of Systems  Musculoskeletal: Positive for back pain and myalgias.  All other systems reviewed and are negative.   Physical Exam Updated Vital Signs BP  133/74 (BP Location: Left Arm)   Pulse 88   Temp 98.3 F (36.8 C) (Oral)   Resp 16   LMP 04/12/2011   SpO2 97%   Physical Exam Vitals and nursing note reviewed.  Constitutional:      General: She is not in acute distress.    Appearance: She is well-developed.  HENT:     Head: Normocephalic and atraumatic.     Nose: Nose normal.  Cardiovascular:     Rate and Rhythm: Normal rate.     Pulses:          Radial pulses are 2+ on the right side and 2+ on the left side.       Dorsalis pedis pulses are 2+ on the right side and 2+ on the left side.     Heart sounds: Normal heart sounds.  Pulmonary:     Effort: Pulmonary effort is normal.     Breath sounds: Normal breath sounds.  Abdominal:     Palpations: Abdomen is soft.     Tenderness: There is no abdominal tenderness.     Comments: No suprapubic or CVA tenderness   Musculoskeletal:        General: Tenderness present.     Lumbar back: Tenderness present.     Comments: TL-spine:  Patient jumps up with very light palpation/graze of skin in midline/paraspinal TL spine area.  No SI or sciatic notch tenderness. Negative SLR bilaterally.  Pelvis: no pain or crepitus with IR/ER/downward pressure of hips bilaterally. No AP/L instability noted with compression. No leg shortening or rotation.   Skin:    General: Skin is warm and dry.     Capillary Refill: Capillary refill takes less than 2 seconds.     Comments: No overlaying rash to back  Neurological:     Mental Status: She is alert.     Sensory: No sensory deficit.     Comments: Can lift and hold legs without unilateral weakness or drift 5/5 strength with flexion/extension of hip, knee and ankle, bilaterally.  Sensation to light touch intact in lower extremities including feet  Psychiatric:        Speech: Speech normal.        Behavior: Behavior normal.        Thought Content: Thought content normal.     ED Results / Procedures / Treatments   Labs (all labs ordered are listed,  but only abnormal results are displayed) Labs Reviewed - No data to display  EKG None  Radiology No results found.  Procedures Procedures (including critical care time)  Medications Ordered in ED Medications - No data to display  ED Course  I have reviewed the triage vital signs and the nursing notes.  Pertinent labs & imaging results that were available during my care of the patient were reviewed by me and considered in my medical decision making (see chart for details).    MDM Rules/Calculators/A&P                     44 yo F here with atraumatic low back pain.  Does repetitive standing/lifting twisting at work.   MSK shows diffuse TL spine tenderness.  Strength is intact.  Sensation intact. Abdominal exam benign, without pulsatility, suprapubic or CVA tenderness. Distal pulses symmetric bilaterally. No overlaying rash.   Highest on ddx is muscular strain vs spasm.  No radicular symptoms and bulging disc vs radicular pain less likely.    No red flag features of back pain present such as saddle anesthesia, bladder/bowel incontinence or retention, fevers, h/o cancer, IVDU, preceding trauma or falls, neuro deficits, urinary symptoms.    Considered other causes of back pain like UTI/pyelo, kidney stone, cauda equina, epidural abscess, dissection unlikely as these don't fit clinical picture.   Emergent imaging not thought to be indicated today as physical exam reassuring.     Will recommend symptomatic treatment with muscle relaxer, high dose tylenol, lidocaine patches, voltaren gel, early ROM exercises.  Recommended f/u with PCP in 2-3 days for persistent symptoms.  ED return precautions discussed with patient who verbalized understanding and is agreeable to plan.   Final Clinical Impression(s) / ED Diagnoses Final diagnoses:  Acute midline low back pain without sciatica    Rx / DC Orders ED Discharge Orders         Ordered    methocarbamol (ROBAXIN) 500 MG tablet  3 times  daily     04/18/19 1543           Arlean Hopping 04/18/19 1550    Hayden Rasmussen, MD 04/18/19 [phone]

## 2019-04-18 NOTE — ED Triage Notes (Addendum)
Pt c/o pain down the right side of her back to her hip. Pt states she has had pain for 3 days. Pt ambulatory in triage and to restroom.

## 2019-04-18 NOTE — ED Notes (Signed)
An After Visit Summary was printed and given to the patient. Discharge instructions given and no further questions at this time.  

## 2019-04-18 NOTE — Discharge Instructions (Signed)
You you were seen in the emergency department for back pain.  I suspect your pain is either from a muscular overuse injury, spasm, strain.  We will treat this with a combination of anti-inflammatories, muscle relaxers, stretches, rest. [phone] mg of acetaminophen (Tylenol) every 6 hours for the next 3-5 days [phone] mg methocarbamol every 8 hours for the next 3-5 days for associated spasms and tightness Over-the-counter lidocaine-containing patches can be applied to the area of pain for 12 hours at a time Apply/rub diclofenac sodium gel on muscles until it feels dry every 6-8 hours, then can use heating pad for 20-30 mins to help absorb Rest for the next 48 hours to avoid further injury Transition back into daily activity slowly to avoid reinjury  Symptoms should improve over the next 48-72 hours and slowly resolve over a course of 7-10 days   Follow up with primary care doctor in 7-10 days if symptoms not improving   Return to the ER if your pain worsens, you develop abdominal pain, urinary symptoms, changes to your bowel movements, numbness in your groin, loss of bladder or bowel control, loss of sensation or heaviness or weakness to your extremities, fevers

## 2019-07-25 ENCOUNTER — Emergency Department (HOSPITAL_COMMUNITY)
Admission: EM | Admit: 2019-07-25 | Discharge: 2019-07-25 | Disposition: A | Discharge status: Home / Self Care | Payer: Medicare HMO | Attending: Emergency Medicine | Admitting: Emergency Medicine

## 2019-07-25 ENCOUNTER — Emergency Department (HOSPITAL_COMMUNITY): Payer: Medicare HMO

## 2019-07-25 ENCOUNTER — Other Ambulatory Visit: Payer: Self-pay

## 2019-07-25 ENCOUNTER — Encounter (HOSPITAL_COMMUNITY): Payer: Self-pay | Admitting: Emergency Medicine

## 2019-07-25 DIAGNOSIS — R079 Chest pain, unspecified: Secondary | ICD-10-CM | POA: Diagnosis present

## 2019-07-25 DIAGNOSIS — Z79899 Other long term (current) drug therapy: Secondary | ICD-10-CM | POA: Diagnosis not present

## 2019-07-25 DIAGNOSIS — R0781 Pleurodynia: Secondary | ICD-10-CM | POA: Diagnosis not present

## 2019-07-25 DIAGNOSIS — J45909 Unspecified asthma, uncomplicated: Secondary | ICD-10-CM | POA: Diagnosis not present

## 2019-07-25 DIAGNOSIS — J039 Acute tonsillitis, unspecified: Secondary | ICD-10-CM | POA: Diagnosis not present

## 2019-07-25 DIAGNOSIS — F17210 Nicotine dependence, cigarettes, uncomplicated: Secondary | ICD-10-CM | POA: Diagnosis not present

## 2019-07-25 LAB — BASIC METABOLIC PANEL
Anion gap: 9 (ref 5–15)
BUN: 10 mg/dL (ref 6–20)
CO2: 22 mmol/L (ref 22–32)
Calcium: 9.5 mg/dL (ref 8.9–10.3)
Chloride: 106 mmol/L (ref 98–111)
Creatinine, Ser: 0.83 mg/dL (ref 0.44–1.00)
GFR calc Af Amer: >60 mL/min (ref >60)
GFR calc non Af Amer: >60 mL/min (ref >60)
Glucose, Bld: 92 mg/dL (ref 70–99)
Potassium: 3.6 mmol/L (ref 3.5–5.1)
Sodium: 137 mmol/L (ref 135–145)

## 2019-07-25 LAB — CBC
HCT: 39.5 % (ref 36.0–46.0)
Hemoglobin: 13 g/dL (ref 12.0–15.0)
MCH: 32.6 pg (ref 26.0–34.0)
MCHC: 32.9 g/dL (ref 30.0–36.0)
MCV: 99 fL (ref 80.0–100.0)
Platelets: 285 10*3/uL (ref 150–400)
RBC: 3.99 MIL/uL (ref 3.87–5.11)
RDW: 11.9 % (ref 11.5–15.5)
WBC: 15.6 10*3/uL — ABNORMAL HIGH (ref 4.0–10.5)
nRBC: 0 % (ref 0.0–0.2)

## 2019-07-25 LAB — TROPONIN I (HIGH SENSITIVITY)
Troponin I (High Sensitivity): <2 ng/L (ref <18)
Troponin I (High Sensitivity): <2 ng/L (ref <18)

## 2019-07-25 LAB — I-STAT BETA HCG BLOOD, ED (NOT ORDERABLE): I-stat hCG, quantitative: <5 m[IU]/mL (ref <5)

## 2019-07-25 MED ORDER — DEXAMETHASONE SODIUM PHOSPHATE 10 MG/ML IJ SOLN
10.0000 mg | Freq: Once | INTRAMUSCULAR | Status: AC
  Administered 2019-07-25: 10 mg via INTRAVENOUS
  Filled 2019-07-25: qty 1

## 2019-07-25 MED ORDER — PREDNISONE 10 MG PO TABS: 20.0000 mg | ORAL_TABLET | Freq: Two times a day (BID) | ORAL | 0 refills | Status: DC

## 2019-07-25 MED ORDER — SODIUM CHLORIDE (PF) 0.9 % IJ SOLN
INTRAMUSCULAR | Status: AC
  Filled 2019-07-25: qty 50

## 2019-07-25 MED ORDER — DOXYCYCLINE HYCLATE 100 MG PO CAPS: 100.0000 mg | ORAL_CAPSULE | Freq: Two times a day (BID) | ORAL | 0 refills | Status: DC

## 2019-07-25 MED ORDER — IOHEXOL 350 MG/ML SOLN
100.0000 mL | Freq: Once | INTRAVENOUS | Status: AC | PRN
  Administered 2019-07-25: 21:00:00 100 mL via INTRAVENOUS

## 2019-07-25 NOTE — ED Triage Notes (Addendum)
Patient reports sensation of throat swelling and chest pain when lying x "a few days." States pain radiates from central to right chest. Denies SOB, fever, N/V/D. No swelling noted during triage. Speaking in full sentences without difficulty.

## 2019-07-25 NOTE — ED Notes (Signed)
Pt transported to CT ?

## 2019-07-25 NOTE — Discharge Instructions (Signed)
Begin taking prednisone and doxycycline as prescribed.  Return to the emergency department if symptoms significantly worsen or change.

## 2019-07-25 NOTE — ED Provider Notes (Signed)
[facility] DEPT Provider Note   CSN: [id number] Arrival date & time: 07/25/19  1714     History Chief Complaint  Patient presents with  . Chest Pain    Valerie Lee is a 44 y.o. female.  Patient is a 44 year old female with history of paranoid schizophrenia, anxiety, bipolar.  She presents today for evaluation of pain in her neck and chest.  Describes a sensation of pain and tightness in her throat that radiates down into her chest.  This has been ongoing for the past few days and is getting worse.  She states the pain is worse when she attempts to talk and breathe deep.  She denies any fevers or chills.  She denies any productive cough.  The history is provided by the patient.  Chest Pain Pain location:  Substernal area Pain quality: tightness   Pain radiates to:  Does not radiate Pain severity:  Moderate Onset quality:  Gradual Duration:  3 days Timing:  Constant Progression:  Worsening Chronicity:  New Context: breathing   Relieved by:  Nothing Worsened by:  Nothing Ineffective treatments:  None tried      Past Medical History:  Diagnosis Date  . Anemia   . Anxiety   . Asthma    rarely uses albuterol rescue  . Bipolar 1 disorder ([place])   . Blood transfusion   . G6PD (glucose 6 phosphatase deficiency)   . G6PD deficiency anemia ([place])   . Mental disorder   . Paranoia ([place])   . Schizophrenia ([place])   . Schizophrenia [facility])     Patient Active Problem List   Diagnosis Date Noted  . Marijuana abuse 10/05/2016  . Paranoid ideation ([place]) 10/04/2016  . Hyperprolactinemia ([place]) 11/28/2014  . Schizoaffective disorder, mixed type ([place]) 11/24/2014  . Fibroid uterus 05/12/2011  . Pelvic pain 05/12/2011    Past Surgical History:  Procedure Laterality Date  . ABDOMINAL HYSTERECTOMY  05/26/2011   Procedure: HYSTERECTOMY ABDOMINAL;  Surgeon: Mora Bellman, MD;  Location: [facility];  Service: Gynecology;  Laterality: N/A;  .  CHOLECYSTECTOMY  2011  . TUBAL LIGATION    . TUBAL LIGATION       OB History    Gravida  6   Para  6   Term  5   Preterm  1   AB  0   Living  6     SAB  0   TAB  0   Ectopic  0   Multiple  0   Live Births              Family History  Problem Relation Age of Onset  . Hypertension Mother     Social History   Tobacco Use  . Smoking status: Current Every Day Smoker    Packs/day: 0.25    Years: 24.00    Pack years: 6.00    Types: Cigarettes  . Smokeless tobacco: Never Used  Substance Use Topics  . Alcohol use: No  . Drug use: No    Home Medications Prior to Admission medications   Medication Sig Start Date End Date Taking? Authorizing Provider  ARIPiprazole (ABILIFY) 10 MG tablet Take 1 tablet (10 mg total) by mouth at bedtime. For mood stabilization 10/07/16  Yes Money, Lowry Ram, FNP  hydrOXYzine (ATARAX/VISTARIL) 25 MG tablet Take 25 mg by mouth at bedtime as needed (sleep).   Yes [provider]  lamoTRIgine (LAMICTAL) 25 MG tablet Take 1 tablet (25 mg total) by mouth  daily. For mood stabilization 10/08/16  Yes Money, Lowry Ram, FNP  lurasidone (LATUDA) 40 MG TABS tablet Take 40 mg by mouth daily with breakfast.   Yes [provider]  traMADol (ULTRAM) 50 MG tablet Take 1 tablet (50 mg total) by mouth every 6 (six) hours as needed. Patient taking differently: Take 50 mg by mouth every 6 (six) hours as needed for moderate pain.  05/21/18  Yes Recardo Evangelist, PA-C  traZODone (DESYREL) 50 MG tablet Take 1 tablet (50 mg total) by mouth at bedtime as needed for sleep. 10/07/16  Yes Money, Lowry Ram, FNP    Allergies    Bactrim, Blue dyes (parenteral), Codeine, Penicillins, Aspirin, and Ibuprofen  Review of Systems   Review of Systems  Cardiovascular: Positive for chest pain.  All other systems reviewed and are negative.   Physical Exam Updated Vital Signs BP 99/77 (BP Location: Left Arm)   Pulse 71   Temp 98.2 F (36.8 C) (Oral)    Resp 18   Ht 4\' 11"  (1.499 m)   Wt 49.9 kg   LMP 04/12/2011   SpO2 99%   BMI 22.22 kg/m   Physical Exam Vitals and nursing note reviewed.  Constitutional:      General: She is not in acute distress.    Appearance: She is well-developed. She is not diaphoretic.  HENT:     Head: Normocephalic and atraumatic.  Neck:     Thyroid: No thyromegaly.     Comments: There is tenderness of the soft tissues of the submental space and anterior neck, however no redness, swelling, fluctuance, or other abnormality.  She has good range of motion.  There is no hoarse voice or trismus. Cardiovascular:     Rate and Rhythm: Normal rate and regular rhythm.     Heart sounds: No murmur. No friction rub. No gallop.   Pulmonary:     Effort: Pulmonary effort is normal. No respiratory distress.     Breath sounds: Normal breath sounds. No wheezing.  Abdominal:     General: Bowel sounds are normal. There is no distension.     Palpations: Abdomen is soft.     Tenderness: There is no abdominal tenderness.  Musculoskeletal:        General: Normal range of motion.     Cervical back: Normal range of motion and neck supple.  Lymphadenopathy:     Cervical: No cervical adenopathy.  Skin:    General: Skin is warm and dry.  Neurological:     Mental Status: She is alert and oriented to person, place, and time.     ED Results / Procedures / Treatments   Labs (all labs ordered are listed, but only abnormal results are displayed) Labs Reviewed  CBC - Abnormal; Notable for the following components:      Result Value   WBC 15.6 (*)    All other components within normal limits  BASIC METABOLIC PANEL  I-STAT BETA HCG BLOOD, ED ([facility], [facility], [facility] ONLY)  I-STAT BETA HCG BLOOD, ED (NOT ORDERABLE)  TROPONIN I (HIGH SENSITIVITY)  TROPONIN I (HIGH SENSITIVITY)    EKG EKG Interpretation  Date/Time:  Thursday July 25 2019 17:36:31 EDT Ventricular Rate:  71 PR Interval:    QRS Duration: 82 QT Interval:  372 QTC  Calculation: 405 R Axis:   63 Text Interpretation: Sinus rhythm Normal ECG No significant change since 02/09/2018 Confirmed by Veryl Speak [phone]) on 07/25/2019 7:39:55 PM   Radiology DG Chest 2 View  Result Date: 07/25/2019 CLINICAL DATA:  Chest pain. Additional provided: Shortness of breath and chest pain for 2 days, pain increased today. EXAM: CHEST - 2 VIEW COMPARISON:  CT angiogram chest 05/21/2018, chest radiograph 05/09/2017 FINDINGS: Heart size within normal limits. There is no appreciable airspace consolidation. No evidence of pleural effusion or pneumothorax. No acute bony abnormality identified. Surgical clips within the right upper quadrant of the abdomen. IMPRESSION: No evidence of acute cardiopulmonary abnormality. Electronically Signed   By: Kellie Simmering DO   On: 07/25/2019 18:21    Procedures Procedures (including critical care time)  Medications Ordered in ED Medications - No data to display  ED Course  I have reviewed the triage vital signs and the nursing notes.  Pertinent labs & imaging results that were available during my care of the patient were reviewed by me and considered in my medical decision making (see chart for details).    MDM Rules/Calculators/A&P  Patient is a 44 year old female presenting with complaints of pain in her throat and chest that is worse with breathing.  She feels as though she could not take a full breath and as if her airway is swollen.  On exam, she has no stridor his vitals are stable.  She has no hypoxia.  Work-up initiated including EKG and cardiac enzymes.  These have all returned unremarkable.  A CTA of the chest was also obtained ruling out PE.  She also underwent CT scan of the soft tissues of her neck.  This shows swelling and enlargement of the adenoid and palatine tonsils compatible with acute tonsillopharyngitis.  This will be treated with IV Decadron, prednisone at home, and Keflex.  She is to return as needed for any  problems.  Final Clinical Impression(s) / ED Diagnoses Final diagnoses:  None    Rx / DC Orders ED Discharge Orders    None       Veryl Speak, MD 07/25/19 2153

## 2019-07-25 NOTE — ED Notes (Signed)
Patient called for blood draw x1 with no answer.

## 2019-07-28 ENCOUNTER — Other Ambulatory Visit: Payer: Self-pay

## 2019-07-28 ENCOUNTER — Emergency Department (HOSPITAL_COMMUNITY)
Admission: EM | Admit: 2019-07-28 | Discharge: 2019-07-28 | Disposition: A | Discharge status: Home / Self Care | Payer: BLUE CROSS/BLUE SHIELD | Attending: Emergency Medicine | Admitting: Emergency Medicine

## 2019-07-28 DIAGNOSIS — Z79899 Other long term (current) drug therapy: Secondary | ICD-10-CM | POA: Diagnosis not present

## 2019-07-28 DIAGNOSIS — K208 Other esophagitis without bleeding: Secondary | ICD-10-CM | POA: Insufficient documentation

## 2019-07-28 DIAGNOSIS — T50905A Adverse effect of unspecified drugs, medicaments and biological substances, initial encounter: Secondary | ICD-10-CM | POA: Diagnosis not present

## 2019-07-28 DIAGNOSIS — T887XXA Unspecified adverse effect of drug or medicament, initial encounter: Secondary | ICD-10-CM | POA: Diagnosis not present

## 2019-07-28 DIAGNOSIS — F17210 Nicotine dependence, cigarettes, uncomplicated: Secondary | ICD-10-CM | POA: Diagnosis not present

## 2019-07-28 DIAGNOSIS — R0989 Other specified symptoms and signs involving the circulatory and respiratory systems: Secondary | ICD-10-CM | POA: Diagnosis present

## 2019-07-28 DIAGNOSIS — J45909 Unspecified asthma, uncomplicated: Secondary | ICD-10-CM | POA: Insufficient documentation

## 2019-07-28 MED ORDER — LIDOCAINE VISCOUS HCL 2 % MT SOLN: 15.0000 mL | OROMUCOSAL | 1 refills | Status: DC | PRN

## 2019-07-28 MED ORDER — LIDOCAINE VISCOUS HCL 2 % MT SOLN
15.0000 mL | Freq: Once | OROMUCOSAL | Status: AC
  Administered 2019-07-28: 15 mL via OROMUCOSAL
  Filled 2019-07-28: qty 15

## 2019-07-28 NOTE — Discharge Instructions (Addendum)
Get help right away if: You have severe pain in your arms, neck, jaw, teeth, or back. You feel sweaty, dizzy, or light-headed. You have chest pain or shortness of breath. You vomit and your vomit looks like blood or coffee grounds. Your stool is bloody or black. You have a fever. You cannot swallow, drink, or eat.

## 2019-07-28 NOTE — ED Triage Notes (Signed)
Pt reports took her antibiotics around 730am last night. Reports that feels like it is stuck in her throat. reports tried a banana but came back up this morning. reports also still feeling light headed.

## 2019-07-28 NOTE — ED Provider Notes (Signed)
[name] Provider Note   CSN: [id number] Arrival date & time: 07/28/19  0856     History Chief Complaint  Patient presents with  . something stuck in throat    Valerie Lee is a 44 y.o. female who presents emergency department with chief complaint of foreign set body sensation and pain in her throat.  Patient is taking doxycycline.  She took her doxycycline tablet last night and states that she did drink it with water.  She developed shortly thereafter pain and and sensation of the pill being stuck in her throat.  She complains that she cannot breathe although she is speaking normally, she also complains that she cannot swallow anything however she is tolerating her own salivary fluid.  She denies active vomiting or nausea.  HPI     Past Medical History:  Diagnosis Date  . Anemia   . Anxiety   . Asthma    rarely uses albuterol rescue  . Bipolar 1 disorder ([place])   . Blood transfusion   . G6PD (glucose 6 phosphatase deficiency)   . G6PD deficiency anemia ([place])   . Mental disorder   . Paranoia ([place])   . Schizophrenia ([place])   . Schizophrenia [facility])     Patient Active Problem List   Diagnosis Date Noted  . Marijuana abuse 10/05/2016  . Paranoid ideation ([place]) 10/04/2016  . Hyperprolactinemia ([place]) 11/28/2014  . Schizoaffective disorder, mixed type ([place]) 11/24/2014  . Fibroid uterus 05/12/2011  . Pelvic pain 05/12/2011    Past Surgical History:  Procedure Laterality Date  . ABDOMINAL HYSTERECTOMY  05/26/2011   Procedure: HYSTERECTOMY ABDOMINAL;  Surgeon: Mora Bellman, MD;  Location: [facility];  Service: Gynecology;  Laterality: N/A;  . CHOLECYSTECTOMY  2011  . TUBAL LIGATION    . TUBAL LIGATION       OB History    Gravida  6   Para  6   Term  5   Preterm  1   AB  0   Living  6     SAB  0   TAB  0   Ectopic  0   Multiple  0   Live Births              Family History  Problem Relation Age of Onset  .  Hypertension Mother     Social History   Tobacco Use  . Smoking status: Current Every Day Smoker    Packs/day: 0.25    Years: 24.00    Pack years: 6.00    Types: Cigarettes  . Smokeless tobacco: Never Used  Substance Use Topics  . Alcohol use: No  . Drug use: No    Home Medications Prior to Admission medications   Medication Sig Start Date End Date Taking? Authorizing Provider  ARIPiprazole (ABILIFY) 10 MG tablet Take 1 tablet (10 mg total) by mouth at bedtime. For mood stabilization 10/07/16   Money, Lowry Ram, FNP  doxycycline (VIBRAMYCIN) 100 MG capsule Take 1 capsule (100 mg total) by mouth 2 (two) times daily. One po bid x 7 days 07/25/19   Veryl Speak, MD  hydrOXYzine (ATARAX/VISTARIL) 25 MG tablet Take 25 mg by mouth at bedtime as needed (sleep).    [provider]  lamoTRIgine (LAMICTAL) 25 MG tablet Take 1 tablet (25 mg total) by mouth daily. For mood stabilization 10/08/16   Money, Darnelle Maffucci B, FNP  lurasidone (LATUDA) 40 MG TABS tablet Take 40 mg by mouth daily with breakfast.  [provider]  predniSONE (DELTASONE) 10 MG tablet Take 2 tablets (20 mg total) by mouth 2 (two) times daily with a meal. 07/25/19   Veryl Speak, MD  traMADol (ULTRAM) 50 MG tablet Take 1 tablet (50 mg total) by mouth every 6 (six) hours as needed. Patient taking differently: Take 50 mg by mouth every 6 (six) hours as needed for moderate pain.  05/21/18   Recardo Evangelist, PA-C  traZODone (DESYREL) 50 MG tablet Take 1 tablet (50 mg total) by mouth at bedtime as needed for sleep. 10/07/16   Money, Lowry Ram, FNP    Allergies    Bactrim, Blue dyes (parenteral), Codeine, Penicillins, Aspirin, and Ibuprofen  Review of Systems   Review of Systems  HENT: Positive for sore throat and trouble swallowing. Negative for voice change.   Gastrointestinal: Negative for vomiting.    Physical Exam Updated Vital Signs BP 104/86   Pulse 75   Temp 98.2 F (36.8 C)   Resp 17   LMP  04/12/2011 Comment: neg hcg on 07/25/2019  SpO2 100%   Physical Exam Vitals and nursing note reviewed.  Constitutional:      General: She is not in acute distress.    Appearance: She is well-developed. She is not diaphoretic.  HENT:     Head: Normocephalic and atraumatic.  Eyes:     General: No scleral icterus.    Conjunctiva/sclera: Conjunctivae normal.  Cardiovascular:     Rate and Rhythm: Normal rate and regular rhythm.     Heart sounds: Normal heart sounds. No murmur. No friction rub. No gallop.   Pulmonary:     Effort: Pulmonary effort is normal. No respiratory distress.     Breath sounds: Normal breath sounds.  Abdominal:     General: Bowel sounds are normal. There is no distension.     Palpations: Abdomen is soft. There is no mass.     Tenderness: There is no abdominal tenderness. There is no guarding.  Musculoskeletal:     Cervical back: Normal range of motion.  Skin:    General: Skin is warm and dry.  Neurological:     Mental Status: She is alert and oriented to person, place, and time.  Psychiatric:        Behavior: Behavior normal.     ED Results / Procedures / Treatments   Labs (all labs ordered are listed, but only abnormal results are displayed) Labs Reviewed - No data to display  EKG None  Radiology No results found.  Procedures Procedures (including critical care time)  Medications Ordered in ED Medications  lidocaine (XYLOCAINE) 2 % viscous mouth solution 15 mL (15 mLs Mouth/Throat Given 07/28/19 1138)    ED Course  I have reviewed the triage vital signs and the nursing notes.  Pertinent labs & imaging results that were available during my care of the patient were reviewed by me and considered in my medical decision making (see chart for details).    MDM Rules/Calculators/A&P                      Patient with globus sensation and pain.  No active vomiting. She has significant pain which is improved with viscous lidocaine. She is tolerating  fluids. Advise soft diet, taking medications with large glass of water, I have ordered viscous lidocaine for symptom relief. Discussed return precautions. F/u with GI. Final Clinical Impression(s) / ED Diagnoses Final diagnoses:  None    Rx / DC Orders ED  Discharge Orders    None       Margarita Mail, PA-C 07/28/19 [place], DO 07/28/19 1230

## 2019-08-20 ENCOUNTER — Encounter (HOSPITAL_COMMUNITY): Payer: Self-pay | Admitting: Emergency Medicine

## 2019-08-20 ENCOUNTER — Emergency Department (HOSPITAL_COMMUNITY)
Admission: EM | Admit: 2019-08-20 | Discharge: 2019-08-20 | Disposition: A | Discharge status: Home / Self Care | Payer: BLUE CROSS/BLUE SHIELD | Attending: Emergency Medicine | Admitting: Emergency Medicine

## 2019-08-20 ENCOUNTER — Emergency Department (HOSPITAL_COMMUNITY): Payer: BLUE CROSS/BLUE SHIELD

## 2019-08-20 ENCOUNTER — Other Ambulatory Visit: Payer: Self-pay

## 2019-08-20 DIAGNOSIS — M25552 Pain in left hip: Secondary | ICD-10-CM | POA: Diagnosis present

## 2019-08-20 DIAGNOSIS — Z79899 Other long term (current) drug therapy: Secondary | ICD-10-CM | POA: Insufficient documentation

## 2019-08-20 DIAGNOSIS — H538 Other visual disturbances: Secondary | ICD-10-CM

## 2019-08-20 DIAGNOSIS — F17210 Nicotine dependence, cigarettes, uncomplicated: Secondary | ICD-10-CM | POA: Insufficient documentation

## 2019-08-20 DIAGNOSIS — H5789 Other specified disorders of eye and adnexa: Secondary | ICD-10-CM | POA: Diagnosis not present

## 2019-08-20 DIAGNOSIS — J45909 Unspecified asthma, uncomplicated: Secondary | ICD-10-CM | POA: Diagnosis not present

## 2019-08-20 LAB — CBG MONITORING, ED: Glucose-Capillary: 82 mg/dL (ref 70–99)

## 2019-08-20 NOTE — ED Triage Notes (Signed)
Patient here from home reporting left hip pain. 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 Ambulatory.  Also reports blurred vision when looking at an object "up close". New job at [employer] and cannot see the screen.

## 2019-08-20 NOTE — ED Provider Notes (Addendum)
[facility] DEPT Provider Note   CSN: [id number] Arrival date & time: 08/20/19  0845     History Chief Complaint  Patient presents with  . Hip Pain  . Blurred Vision    Valerie Lee is a 44 y.o. female.  44 year old female presents with left hip pain.  Pain characterizes sharp and worse with standing began today while she was at work at [employer].  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  Denies any recent history of trauma.  No new numbness or tingling to her lower extremities.  Pain is positional and no treatment use prior to arrival  Patient also has a secondary complaint of blurred vision is worse when she looks at her computer screen.  Denies any pain to her eyes.  States that the blurry vision is binocular.  No associated vision loss.  Better when she looks at distances denies any associated headache.  No trouble with her distant vision.        Past Medical History:  Diagnosis Date  . Anemia   . Anxiety   . Asthma    rarely uses albuterol rescue  . Bipolar 1 disorder ([place])   . Blood transfusion   . G6PD (glucose 6 phosphatase deficiency)   . G6PD deficiency anemia ([place])   . Mental disorder   . Paranoia ([place])   . Schizophrenia ([place])   . Schizophrenia [facility])     Patient Active Problem List   Diagnosis Date Noted  . Marijuana abuse 10/05/2016  . Paranoid ideation ([place]) 10/04/2016  . Hyperprolactinemia ([place]) 11/28/2014  . Schizoaffective disorder, mixed type ([place]) 11/24/2014  . Fibroid uterus 05/12/2011  . Pelvic pain 05/12/2011    Past Surgical History:  Procedure Laterality Date  . ABDOMINAL HYSTERECTOMY  05/26/2011   Procedure: HYSTERECTOMY ABDOMINAL;  Surgeon: Mora Bellman, MD;  Location: [facility];  Service: Gynecology;  Laterality: N/A;  . CHOLECYSTECTOMY  2011  . TUBAL LIGATION    . TUBAL LIGATION       OB History    Gravida  6   Para  6   Term  5   Preterm  1   AB  0   Living    6     SAB  0   TAB  0   Ectopic  0   Multiple  0   Live Births              Family History  Problem Relation Age of Onset  . Hypertension Mother     Social History   Tobacco Use  . Smoking status: Current Every Day Smoker    Packs/day: 0.25    Years: 24.00    Pack years: 6.00    Types: Cigarettes  . Smokeless tobacco: Never Used  Substance Use Topics  . Alcohol use: No  . Drug use: No    Home Medications Prior to Admission medications   Medication Sig Start Date End Date Taking? Authorizing Provider  ARIPiprazole (ABILIFY) 10 MG tablet Take 1 tablet (10 mg total) by mouth at bedtime. For mood stabilization 10/07/16   Money, Lowry Ram, FNP  doxycycline (VIBRAMYCIN) 100 MG capsule Take 1 capsule (100 mg total) by mouth 2 (two) times daily. One po bid x 7 days 07/25/19   Veryl Speak, MD  hydrOXYzine (ATARAX/VISTARIL) 25 MG tablet Take 25 mg by mouth at bedtime as needed (sleep).    [provider]  lamoTRIgine (LAMICTAL) 25 MG tablet  Take 1 tablet (25 mg total) by mouth daily. For mood stabilization 10/08/16   Money, Darnelle Maffucci B, FNP  lidocaine (XYLOCAINE) 2 % solution Use as directed 15 mLs in the mouth or throat every 4 (four) hours as needed for mouth pain. 07/28/19   Harris, Abigail, PA-C  lurasidone (LATUDA) 40 MG TABS tablet Take 40 mg by mouth daily with breakfast.    [provider]  predniSONE (DELTASONE) 10 MG tablet Take 2 tablets (20 mg total) by mouth 2 (two) times daily with a meal. 07/25/19   Veryl Speak, MD  traMADol (ULTRAM) 50 MG tablet Take 1 tablet (50 mg total) by mouth every 6 (six) hours as needed. Patient taking differently: Take 50 mg by mouth every 6 (six) hours as needed for moderate pain.  05/21/18   Recardo Evangelist, PA-C  traZODone (DESYREL) 50 MG tablet Take 1 tablet (50 mg total) by mouth at bedtime as needed for sleep. 10/07/16   Money, Lowry Ram, FNP    Allergies    Bactrim, Blue dyes (parenteral), Codeine, Penicillins,  Aspirin, and Ibuprofen  Review of Systems   Review of Systems  All other systems reviewed and are negative.   Physical Exam Updated Vital Signs BP 105/80 (BP Location: Right Arm)   Pulse 67   Temp 98.4 F (36.9 C) (Oral)   Resp 16   LMP 04/12/2011 Comment: neg hcg on 07/25/2019  SpO2 100%   Physical Exam Vitals and nursing note reviewed.  Constitutional:      General: She is not in acute distress.    Appearance: Normal appearance. She is well-developed. She is not toxic-appearing.  HENT:     Head: Normocephalic and atraumatic.  Eyes:     General: Lids are normal.     Extraocular Movements: Extraocular movements intact.     Conjunctiva/sclera: Conjunctivae normal.     Pupils: Pupils are equal, round, and reactive to light.  Neck:     Thyroid: No thyroid mass.     Trachea: No tracheal deviation.  Cardiovascular:     Rate and Rhythm: Normal rate and regular rhythm.     Heart sounds: Normal heart sounds. No murmur. No gallop.   Pulmonary:     Effort: Pulmonary effort is normal. No respiratory distress.     Breath sounds: Normal breath sounds. No stridor. No decreased breath sounds, wheezing, rhonchi or rales.  Abdominal:     General: Bowel sounds are normal. There is no distension.     Palpations: Abdomen is soft.     Tenderness: There is no abdominal tenderness. There is no rebound.  Musculoskeletal:        General: No tenderness. Normal range of motion.     Cervical back: Normal range of motion and neck supple.       Legs:     Comments: No shortening or rotation to patient's left lower extremity.  Neurovascular intact at left foot.  Skin:    General: Skin is warm and dry.     Findings: No abrasion or rash.  Neurological:     Mental Status: She is alert and oriented to person, place, and time.     GCS: GCS eye subscore is 4. GCS verbal subscore is 5. GCS motor subscore is 6.     Cranial Nerves: No cranial nerve deficit.     Sensory: No sensory deficit.    Psychiatric:        Speech: Speech normal.        Behavior:  Behavior normal.     ED Results / Procedures / Treatments   Labs (all labs ordered are listed, but only abnormal results are displayed) Labs Reviewed  CBG MONITORING, ED    EKG None  Radiology DG Hip Unilat With Pelvis 2-3 Views Left  Result Date: 08/20/2019 CLINICAL DATA:  44 year old female with left hip pain for several days increasing this morning after near fall. EXAM: DG HIP (WITH OR WITHOUT PELVIS) 2-3V LEFT COMPARISON:  CT Abdomen and Pelvis 02/13/2018. FINDINGS: Femoral heads remain normally located. Bone mineralization is within normal limits. Pelvis appears intact. Normal SI joints. Chronic pelvic phleboliths greater on the left. Grossly intact proximal right femur. Proximal left femur appears intact and normal. Negative visible lower abdominal and pelvic visceral contours. IMPRESSION: Negative. Electronically Signed   By: Genevie Ann M.D.   On: 08/20/2019 09:47    Procedures Procedures (including critical care time)  Medications Ordered in ED Medications - No data to display  ED Course  I have reviewed the triage vital signs and the nursing notes.  Pertinent labs & imaging results that were available during my care of the patient were reviewed by me and considered in my medical decision making (see chart for details).    MDM Rules/Calculators/A&P                      X-ray left hip without acute abnormality.  Blood sugar is 82.  Patient will be given referral to community wellness center Final Clinical Impression(s) / ED Diagnoses Final diagnoses:  None    Rx / DC Orders ED Discharge Orders    None       Lacretia Leigh, MD 08/20/19 1011    Lacretia Leigh, MD 08/20/19 1012

## 2019-11-15 ENCOUNTER — Other Ambulatory Visit: Payer: PRIVATE HEALTH INSURANCE

## 2019-12-06 ENCOUNTER — Encounter (HOSPITAL_COMMUNITY): Payer: Self-pay

## 2019-12-06 ENCOUNTER — Other Ambulatory Visit: Payer: Self-pay

## 2019-12-06 ENCOUNTER — Emergency Department (HOSPITAL_COMMUNITY): Payer: PRIVATE HEALTH INSURANCE

## 2019-12-06 ENCOUNTER — Emergency Department (HOSPITAL_COMMUNITY)
Admission: EM | Admit: 2019-12-06 | Discharge: 2019-12-06 | Disposition: A | Discharge status: Home / Self Care | Payer: PRIVATE HEALTH INSURANCE | Attending: Emergency Medicine | Admitting: Emergency Medicine

## 2019-12-06 DIAGNOSIS — J029 Acute pharyngitis, unspecified: Secondary | ICD-10-CM

## 2019-12-06 DIAGNOSIS — S46011A Strain of muscle(s) and tendon(s) of the rotator cuff of right shoulder, initial encounter: Secondary | ICD-10-CM | POA: Insufficient documentation

## 2019-12-06 DIAGNOSIS — Z87891 Personal history of nicotine dependence: Secondary | ICD-10-CM | POA: Insufficient documentation

## 2019-12-06 DIAGNOSIS — J45909 Unspecified asthma, uncomplicated: Secondary | ICD-10-CM | POA: Insufficient documentation

## 2019-12-06 DIAGNOSIS — Y999 Unspecified external cause status: Secondary | ICD-10-CM | POA: Insufficient documentation

## 2019-12-06 DIAGNOSIS — X500XXA Overexertion from strenuous movement or load, initial encounter: Secondary | ICD-10-CM | POA: Insufficient documentation

## 2019-12-06 DIAGNOSIS — S46911A Strain of unspecified muscle, fascia and tendon at shoulder and upper arm level, right arm, initial encounter: Secondary | ICD-10-CM

## 2019-12-06 DIAGNOSIS — Z79899 Other long term (current) drug therapy: Secondary | ICD-10-CM | POA: Insufficient documentation

## 2019-12-06 DIAGNOSIS — Y939 Activity, unspecified: Secondary | ICD-10-CM | POA: Insufficient documentation

## 2019-12-06 DIAGNOSIS — Y929 Unspecified place or not applicable: Secondary | ICD-10-CM | POA: Insufficient documentation

## 2019-12-06 HISTORY — DX: Depression, unspecified: F32.A

## 2019-12-06 LAB — GROUP A STREP BY PCR: Group A Strep by PCR: NOT DETECTED

## 2019-12-06 MED ORDER — LIDOCAINE VISCOUS HCL 2 % MT SOLN
15.0000 mL | OROMUCOSAL | 0 refills | Status: DC | PRN
Start: 2019-12-06 — End: 2023-08-17

## 2019-12-06 MED ORDER — METHOCARBAMOL 500 MG PO TABS
500.0000 mg | ORAL_TABLET | Freq: Two times a day (BID) | ORAL | 0 refills | Status: DC
Start: 2019-12-06 — End: 2020-04-22

## 2019-12-06 NOTE — ED Triage Notes (Signed)
Patient c/o right shoulder pain. Patient states she had a rotator cuff injury years ago and her job requires a lot of of lifting. Patient reports increased pain with movement.  Patient also c/o sore throat x 2 days. Patient denies any fever.

## 2019-12-06 NOTE — ED Provider Notes (Signed)
[facility] DEPT Provider Note   CSN: [id number] Arrival date & time: 12/06/19  7782     History Chief Complaint  Patient presents with  . Shoulder Pain  . Sore Throat    Valerie Lee is a 44 y.o. female with a past medical history of G6PD deficiency, schizophrenia, asthma presenting to the ED with multiple complaints. 1.  Sore throat for the past 3 to 4 days.  Reports bilateral pain with associated lymphadenopathy.  Denies any cough.  Denies any fevers.  No sick contacts with similar symptoms.  Some improvement noted with Tylenol.  Worse when she tries to eat or drink.  No history of PTA or RPA in the past.  Denies any chest pain, neck stiffness. 2.  Shoulder pain.  Reports chronic shoulder pain for the past several years that worsens with specific movements.  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  She was told in the past that she may need to have surgery on her rotator cuff.  Has been taking Tylenol with this with only minimal improvement.  Pain is worse when she tries to sleep.  Denies any chest pain, injury or fall, numbness or weakness.  HPI     Past Medical History:  Diagnosis Date  . Anemia   . Anxiety   . Asthma    rarely uses albuterol rescue  . Bipolar 1 disorder ([place])   . Blood transfusion   . Depression   . G6PD (glucose 6 phosphatase deficiency)   . 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 Mental disorder   . Paranoia ([place])   . Schizophrenia ([place])   . Schizophrenia [facility])     Patient Active Problem List   Diagnosis Date Noted  . Marijuana abuse 10/05/2016  . Paranoid ideation ([place]) 10/04/2016  . Hyperprolactinemia ([place]) 11/28/2014  . Schizoaffective disorder, mixed type ([place]) 11/24/2014  . Fibroid uterus 05/12/2011  . Pelvic pain 05/12/2011    Past Surgical History:  Procedure Laterality Date  . ABDOMINAL  HYSTERECTOMY  05/26/2011   Procedure: HYSTERECTOMY ABDOMINAL;  Surgeon: Mora Bellman, MD;  Location: [facility];  Service: Gynecology;  Laterality: N/A;  . CHOLECYSTECTOMY  2011  . TUBAL LIGATION    . TUBAL LIGATION       OB History    Gravida  6   Para  6   Term  5   Preterm  1   AB  0   Living  6     SAB  0   TAB  0   Ectopic  0   Multiple  0   Live Births              Family History  Problem Relation Age of Onset  . Hypertension Mother     Social History   Tobacco Use  . Smoking status: Former Smoker    Packs/day: 0.25    Years: 24.00    Pack years: 6.00    Types: Cigarettes  . Smokeless tobacco: Never Used  Vaping Use  . Vaping Use: Never used  Substance Use Topics  . Alcohol use: No  . Drug use: No    Home Medications Prior to Admission medications   Medication Sig Start Date End Date Taking? Authorizing Provider  ARIPiprazole (ABILIFY) 10 MG tablet Take 1 tablet (10 mg total) by mouth at bedtime. For mood stabilization  10/07/16   Money, Lowry Ram, FNP  doxycycline (VIBRAMYCIN) 100 MG capsule Take 1 capsule (100 mg total) by mouth 2 (two) times daily. One po bid x 7 days 07/25/19   Veryl Speak, MD  hydrOXYzine (ATARAX/VISTARIL) 25 MG tablet Take 25 mg by mouth at bedtime as needed (sleep).    [provider]  lamoTRIgine (LAMICTAL) 25 MG tablet Take 1 tablet (25 mg total) by mouth daily. For mood stabilization 10/08/16   Money, Darnelle Maffucci B, FNP  lidocaine (XYLOCAINE) 2 % solution Use as directed 15 mLs in the mouth or throat as needed for mouth pain. 12/06/19   Briahna Pescador, PA-C  lurasidone (LATUDA) 40 MG TABS tablet Take 40 mg by mouth daily with breakfast.    [provider]  methocarbamol (ROBAXIN) 500 MG tablet Take 1 tablet (500 mg total) by mouth 2 (two) times daily. 12/06/19   Seichi Kaufhold, PA-C  predniSONE (DELTASONE) 10 MG tablet Take 2 tablets (20 mg total) by mouth 2 (two) times daily with a meal. 07/25/19   Veryl Speak, MD    traMADol (ULTRAM) 50 MG tablet Take 1 tablet (50 mg total) by mouth every 6 (six) hours as needed. Patient taking differently: Take 50 mg by mouth every 6 (six) hours as needed for moderate pain.  05/21/18   Recardo Evangelist, PA-C  traZODone (DESYREL) 50 MG tablet Take 1 tablet (50 mg total) by mouth at bedtime as needed for sleep. 10/07/16   Money, Lowry Ram, FNP    Allergies    Bactrim, Blue dyes (parenteral), Codeine, Penicillins, Aspirin, and Ibuprofen  Review of Systems   Review of Systems  Constitutional: Negative for chills and fever.  HENT: Positive for sore throat. Negative for congestion and rhinorrhea.   Respiratory: Negative for cough and shortness of breath.   Gastrointestinal: Negative for nausea and vomiting.  Musculoskeletal: Positive for arthralgias and myalgias.  Neurological: Negative for weakness.    Physical Exam Updated Vital Signs BP (!) 106/53 (BP Location: Left Arm)   Pulse 65   Temp 98.4 F (36.9 C) (Oral)   Resp 16   Ht 4\' 9"  (1.448 m)   Wt 54.9 kg   LMP 04/12/2011 Comment: neg hcg on 07/25/2019  SpO2 97%   BMI 26.18 kg/m   Physical Exam Vitals and nursing note reviewed.  Constitutional:      General: She is not in acute distress.    Appearance: She is well-developed. She is not diaphoretic.  HENT:     Head: Normocephalic and atraumatic.     Mouth/Throat:     Pharynx: Uvula midline. Posterior oropharyngeal erythema present.     Tonsils: No tonsillar exudate or tonsillar abscesses. 1+ on the right. 1+ on the left.     Comments: Bilaterally, symmetrically enlarged tonsils without exudates. Patient does not appear to be in acute distress. No trismus or drooling present. No pooling of secretions. Patient is tolerating secretions and is not in respiratory distress. No neck pain or tenderness to palpation of the neck. Full active and passive range of motion of the neck. No evidence of RPA or PTA. Eyes:     General: No scleral icterus.     Conjunctiva/sclera: Conjunctivae normal.  Cardiovascular:     Rate and Rhythm: Normal rate and regular rhythm.     Heart sounds: Normal heart sounds.  Pulmonary:     Effort: Pulmonary effort is normal. No respiratory distress.     Breath sounds: Normal breath sounds.  Musculoskeletal:  General: Tenderness present. Normal range of motion.     Cervical back: Normal range of motion.     Comments: Tenderness to palpation diffusely of the right shoulder without erythema, edema or warmth of joint.  Pain with overhead reaching.  2+ radial pulses noted bilaterally.  No deformities.  Compartments are soft.  Skin:    Findings: No rash.  Neurological:     Mental Status: She is alert.     ED Results / Procedures / Treatments   Labs (all labs ordered are listed, but only abnormal results are displayed) Labs Reviewed  GROUP A STREP BY PCR    EKG None  Radiology DG Shoulder Right  Result Date: 12/06/2019 CLINICAL DATA:  Right shoulder pain. Worse with movement over the last 2 weeks. EXAM: RIGHT SHOULDER - 2+ VIEW COMPARISON:  None. FINDINGS: There is no evidence of fracture or dislocation. There is no evidence of arthropathy or other focal bone abnormality. Soft tissues are unremarkable. IMPRESSION: No acute osseous injury of the right shoulder. Electronically Signed   By: Kathreen Devoid   On: 12/06/2019 09:51    Procedures Procedures (including critical care time)  Medications Ordered in ED Medications - No data to display  ED Course  I have reviewed the triage vital signs and the nursing notes.  Pertinent labs & imaging results that were available during my care of the patient were reviewed by me and considered in my medical decision making (see chart for details).    MDM Rules/Calculators/A&P                          1. Sore throat- Pt rapid strep test negative. Pt is tolerating secretions, not in respiratory distress, no neck pain, no trismus. Presentation not concerning for  peritonsillar abscess or spread of infection to deep spaces of the throat; patent airway. Pt will be discharged with lidocaine to swish and spit. Tylenol as needed for pain/fever. Specific return precautions discussed.  2. R shoulder pain-x-rays are negative for acute abnormality.  Suspect this is a strain that she has been predisposed to due to her known rotator cuff strain.  We will have her take muscle relaxer and continue Tylenol as needed.  Will give orthopedic follow-up here in [place] as she recently moved here.  Doubt infectious or vascular cause of symptoms.  Strict return precautions given.  All imaging, if done today, including plain films, CT scans, and ultrasounds, independently reviewed by me, and interpretations confirmed via formal radiology reads.  Patient is hemodynamically stable, in NAD, and able to ambulate in the ED. Evaluation does not show pathology that would require ongoing emergent intervention or inpatient treatment. I explained the diagnosis to the patient. Pain has been managed and has no complaints prior to discharge. Patient is comfortable with above plan and is stable for discharge at this time. All questions were answered prior to disposition. Strict return precautions for returning to the ED were discussed. Encouraged follow up with PCP.   An After Visit Summary was printed and given to the patient.   Portions of this note were generated with [employer]. Dictation errors may occur despite best attempts at proofreading.  Final Clinical Impression(s) / ED Diagnoses Final diagnoses:  Strain of right shoulder, initial encounter  Viral pharyngitis    Rx / DC Orders ED Discharge Orders         Ordered    methocarbamol (ROBAXIN) 500 MG tablet  2 times  daily        12/06/19 1139    lidocaine (XYLOCAINE) 2 % solution  As needed        12/06/19 [address] PA-C 12/06/19 1144    Virgel Manifold, MD 12/07/19 [phone]

## 2019-12-06 NOTE — Discharge Instructions (Signed)
Swish and spit the lidocaine to help with throat discomfort. Use the Robaxin as needed to help with the shoulder pain. Continue Tylenol as needed. Follow-up with orthopedist listed below. Return to the ER if you start to experience worsening pain, injuries or falls, numbness in arms or legs, chest pain or shortness of breath.

## 2019-12-23 ENCOUNTER — Ambulatory Visit: Payer: Self-pay | Admitting: Orthopaedic Surgery

## 2020-01-01 ENCOUNTER — Other Ambulatory Visit: Payer: Self-pay

## 2020-01-01 ENCOUNTER — Ambulatory Visit (INDEPENDENT_AMBULATORY_CARE_PROVIDER_SITE_OTHER): Payer: Self-pay | Admitting: Orthopaedic Surgery

## 2020-01-01 DIAGNOSIS — M25511 Pain in right shoulder: Secondary | ICD-10-CM

## 2020-01-01 DIAGNOSIS — Z96611 Presence of right artificial shoulder joint: Secondary | ICD-10-CM

## 2020-01-01 MED ORDER — METHYLPREDNISOLONE ACETATE 40 MG/ML IJ SUSP
40.0000 mg | INTRAMUSCULAR | Status: AC | PRN
  Administered 2020-01-01: 40 mg via INTRA_ARTICULAR

## 2020-01-01 MED ORDER — TIZANIDINE HCL 4 MG PO TABS
4.0000 mg | ORAL_TABLET | Freq: Three times a day (TID) | ORAL | 0 refills | Status: DC | PRN
Start: 2020-01-01 — End: 2020-04-17

## 2020-01-01 MED ORDER — LIDOCAINE HCL 1 % IJ SOLN
3.0000 mL | INTRAMUSCULAR | Status: AC | PRN
  Administered 2020-01-01: 3 mL

## 2020-01-01 NOTE — Progress Notes (Signed)
Office Visit Note   Patient: Valerie Lee           Date of Birth: [birth year]           MRN: [id number] Visit Date: 01/01/2020              Requested by: No referring provider defined for this encounter. PCP: Patient, No Pcp Per   Assessment & Plan: Visit Diagnoses:  1. Acute pain of right shoulder     Plan: I did recommend a steroid injection in her right shoulder subacromial space today to calm down the acute pain.  We will try some Zanaflex to relax the muscles and help some with pain.  I also recommended over-the-counter anti-inflammatories.  Given the amount of pain and weakness she has in her right shoulder, we will obtain an MRI to rule out a rotator cuff tear.  We will see her back to review the MRI.  She will be careful with the shoulder in the interim.  All question concerns were answered and addressed.  Follow-Up Instructions: No follow-ups on file.   Orders:  No orders of the defined types were placed in this encounter.  No orders of the defined types were placed in this encounter.     Procedures: Large Joint Inj: R subacromial bursa on 01/01/2020 3:50 PM Indications: pain and diagnostic evaluation Details: 22 G 1.5 in needle  Arthrogram: No  Medications: 3 mL lidocaine 1 %; 40 mg methylPREDNISolone acetate 40 MG/ML Outcome: tolerated well, no immediate complications Procedure, treatment alternatives, risks and benefits explained, specific risks discussed. Consent was given by the patient. Immediately prior to procedure a time out was called to verify the correct patient, procedure, equipment, support staff and site/side marked as required. Patient was prepped and draped in the usual sterile fashion.       Clinical Data: No additional findings.   Subjective: Chief Complaint  Patient presents with  . Right Shoulder - Pain  Patient is a very pleasant 44 year old right-hand-dominant female who comes in with acute right shoulder pain.  She injured  this shoulder when she was working at [employer] she states.  She went to the emergency room and x-rays were obtained showing no acute findings.  She still has significant right shoulder pain.  She does report to me that she was told in the past that she needed rotator cuff surgery.  Significant injury prior to recently with that right shoulder she states.  It is radiating up into her neck and down to her elbow at this point.  It mainly hurts with reaching behind and overhead.  She does report significant right shoulder weakness.  HPI  Review of Systems She currently denies any headache, chest pain, shortness of breath, fever, chills, nausea, vomiting  Objective: Vital Signs: LMP 04/12/2011 Comment: neg hcg on 07/25/2019  Physical Exam She is alert and orient x3 and in no acute distress Ortho Exam Examination of her right shoulder shows that it clinically is well located.  She has a lot of giveaway strength with testing the rotator cuff with abduction and external rotation.  It is very painful to her globally. Specialty Comments:  No specialty comments available.  Imaging: No results found. X-rays independently reviewed of the right shoulder show no acute findings.  PMFS History: Patient Active Problem List   Diagnosis Date Noted  . Marijuana abuse 10/05/2016  . Paranoid ideation (HCC) 10/04/2016  . Hyperprolactinemia (HCC) 11/28/2014  . Schizoaffective disorder, mixed type (HCC) 11/24/2014  .  Fibroid uterus 05/12/2011  . Pelvic pain 05/12/2011   Past Medical History:  Diagnosis Date  . Anemia   . Anxiety   . Asthma    rarely uses albuterol rescue  . Bipolar 1 disorder (HCC)   . Blood transfusion   . Depression   . G6PD (glucose 6 phosphatase deficiency)   . G6PD deficiency anemia (HCC)   . Mental disorder   . Paranoia (HCC)   . Schizophrenia (HCC)   . Schizophrenia (HCC)     Family History  Problem Relation Age of Onset  . Hypertension Mother     Past Surgical History:    Procedure Laterality Date  . ABDOMINAL HYSTERECTOMY  05/26/2011   Procedure: HYSTERECTOMY ABDOMINAL;  Surgeon: Catalina Antigua, MD;  Location: [facility];  Service: Gynecology;  Laterality: N/A;  . CHOLECYSTECTOMY  2011  . TUBAL LIGATION    . TUBAL LIGATION     Social History   Occupational History  . Not on file  Tobacco Use  . Smoking status: Former Smoker    Packs/day: 0.25    Years: 24.00    Pack years: 6.00    Types: Cigarettes  . Smokeless tobacco: Never Used  Vaping Use  . Vaping Use: Never used  Substance and Sexual Activity  . Alcohol use: No  . Drug use: No  . Sexual activity: Yes    Birth control/protection: Surgical

## 2020-01-14 ENCOUNTER — Other Ambulatory Visit: Payer: Self-pay

## 2020-01-15 ENCOUNTER — Ambulatory Visit: Payer: Self-pay | Admitting: Orthopaedic Surgery

## 2020-02-01 ENCOUNTER — Other Ambulatory Visit: Payer: Self-pay

## 2020-02-03 ENCOUNTER — Ambulatory Visit: Payer: Self-pay | Admitting: Orthopaedic Surgery

## 2020-04-02 ENCOUNTER — Other Ambulatory Visit: Payer: Self-pay

## 2020-04-06 ENCOUNTER — Ambulatory Visit: Payer: Self-pay | Admitting: Physician Assistant

## 2020-04-06 ENCOUNTER — Ambulatory Visit: Payer: Self-pay | Admitting: Orthopaedic Surgery

## 2020-04-16 ENCOUNTER — Other Ambulatory Visit: Payer: Self-pay

## 2020-04-16 ENCOUNTER — Emergency Department (HOSPITAL_COMMUNITY)
Admission: EM | Admit: 2020-04-16 | Discharge: 2020-04-16 | Disposition: A | Discharge status: Home / Self Care | Payer: 59 | Attending: Emergency Medicine | Admitting: Emergency Medicine

## 2020-04-16 ENCOUNTER — Telehealth: Payer: Self-pay | Admitting: Orthopaedic Surgery

## 2020-04-16 ENCOUNTER — Encounter (HOSPITAL_COMMUNITY): Payer: Self-pay

## 2020-04-16 DIAGNOSIS — Z87891 Personal history of nicotine dependence: Secondary | ICD-10-CM | POA: Diagnosis not present

## 2020-04-16 DIAGNOSIS — M25511 Pain in right shoulder: Secondary | ICD-10-CM | POA: Insufficient documentation

## 2020-04-16 DIAGNOSIS — J45909 Unspecified asthma, uncomplicated: Secondary | ICD-10-CM | POA: Diagnosis not present

## 2020-04-16 MED ORDER — HYDROCODONE-ACETAMINOPHEN 5-325 MG PO TABS: 1.0000 | ORAL_TABLET | Freq: Four times a day (QID) | ORAL | 0 refills | Status: DC | PRN

## 2020-04-16 MED ORDER — HYDROCODONE-ACETAMINOPHEN 5-325 MG PO TABS
1.0000 | ORAL_TABLET | Freq: Once | ORAL | Status: AC
  Administered 2020-04-16: 1 via ORAL
  Filled 2020-04-16: qty 1

## 2020-04-16 NOTE — Discharge Instructions (Signed)
Please call Dr. Ninfa Linden office tomorrow morning to schedule a follow up appointment/reschedule of your MRI. Your pain will likely be present until surgical repair.   I have prescribed a very short course of pain medication to take for severe pain.

## 2020-04-16 NOTE — ED Triage Notes (Signed)
Pt reports right shoulder pain for months. Pt reports seeing an orthopedic for torn rotator cuff. Pt states she is supposed to get an MRI, but had trouble with medical insurance so MRI has been delayed.

## 2020-04-16 NOTE — ED Provider Notes (Signed)
[facility] DEPT Provider Note   CSN: [id number] Arrival date & time: 04/16/20  1859     History Chief Complaint  Patient presents with  . Shoulder Pain    Valerie Lee is a 45 y.o. female with PMHx schizophrenia, anxiety, bipolar disorder who presents to the ED today with complaint of worsening right shoulder pain x 2 days; has been present for several months. Pt states her pain began after doing heavy lifting at work. She saw Dr. Ninfa Linden orthopedics who diagnosed her with a torn rotator cuff. She had plans for an MRI last week however she had a change in her insurance and it was not covered. She called Dr. Ninfa Linden back today as her insurance will now cover it and she would like it reschedule. She states she was told to come to the ED for further evaluation. She has been taking Tylenol and many OTC creams/patches for pain without relief. She is allergic to Ibuprofen. No other complaints at this time.   The history is provided by the patient and medical records.       Past Medical History:  Diagnosis Date  . Anemia   . Anxiety   . Asthma    rarely uses albuterol rescue  . Bipolar 1 disorder ([place])   . Blood transfusion   . Depression   . G6PD (glucose 6 phosphatase deficiency)   . G6PD deficiency anemia ([place])   . Mental disorder   . Paranoia ([place])   . Schizophrenia ([place])   . Schizophrenia [facility])     Patient Active Problem List   Diagnosis Date Noted  . Marijuana abuse 10/05/2016  . Paranoid ideation ([place]) 10/04/2016  . Hyperprolactinemia ([place]) 11/28/2014  . Schizoaffective disorder, mixed type ([place]) 11/24/2014  . Fibroid uterus 05/12/2011  . Pelvic pain 05/12/2011    Past Surgical History:  Procedure Laterality Date  . ABDOMINAL HYSTERECTOMY  05/26/2011   Procedure: HYSTERECTOMY ABDOMINAL;  Surgeon: Mora Bellman, MD;  Location: [facility];  Service: Gynecology;  Laterality: N/A;  . CHOLECYSTECTOMY  2011  . TUBAL LIGATION    .  TUBAL LIGATION       OB History    Gravida  6   Para  6   Term  5   Preterm  1   AB  0   Living  6     SAB  0   IAB  0   Ectopic  0   Multiple  0   Live Births              Family History  Problem Relation Age of Onset  . Hypertension Mother     Social History   Tobacco Use  . Smoking status: Former Smoker    Packs/day: 0.25    Years: 24.00    Pack years: 6.00    Types: Cigarettes  . Smokeless tobacco: Never Used  Vaping Use  . Vaping Use: Never used  Substance Use Topics  . Alcohol use: No  . Drug use: No    Home Medications Prior to Admission medications   Medication Sig Start Date End Date Taking? Authorizing Provider  HYDROcodone-acetaminophen (NORCO/VICODIN) 5-325 MG tablet Take 1 tablet by mouth every 6 (six) hours as needed for severe pain. 04/16/20  Yes Patrica Mendell, PA-C  ARIPiprazole (ABILIFY) 10 MG tablet Take 1 tablet (10 mg total) by mouth at bedtime. For mood stabilization 10/07/16   Money, Lowry Ram, FNP  doxycycline (VIBRAMYCIN) 100 MG capsule Take 1  capsule (100 mg total) by mouth 2 (two) times daily. One po bid x 7 days 07/25/19   Veryl Speak, MD  hydrOXYzine (ATARAX/VISTARIL) 25 MG tablet Take 25 mg by mouth at bedtime as needed (sleep).    [provider]  lamoTRIgine (LAMICTAL) 25 MG tablet Take 1 tablet (25 mg total) by mouth daily. For mood stabilization 10/08/16   Money, Darnelle Maffucci B, FNP  lidocaine (XYLOCAINE) 2 % solution Use as directed 15 mLs in the mouth or throat as needed for mouth pain. 12/06/19   Khatri, Hina, PA-C  lurasidone (LATUDA) 40 MG TABS tablet Take 40 mg by mouth daily with breakfast.    [provider]  methocarbamol (ROBAXIN) 500 MG tablet Take 1 tablet (500 mg total) by mouth 2 (two) times daily. 12/06/19   Khatri, Hina, PA-C  predniSONE (DELTASONE) 10 MG tablet Take 2 tablets (20 mg total) by mouth 2 (two) times daily with a meal. 07/25/19   Veryl Speak, MD  tiZANidine (ZANAFLEX) 4 MG tablet  Take 1 tablet (4 mg total) by mouth every 8 (eight) hours as needed for muscle spasms. 01/01/20   Mcarthur Rossetti, MD  traMADol (ULTRAM) 50 MG tablet Take 1 tablet (50 mg total) by mouth every 6 (six) hours as needed. Patient taking differently: Take 50 mg by mouth every 6 (six) hours as needed for moderate pain.  05/21/18   Recardo Evangelist, PA-C  traZODone (DESYREL) 50 MG tablet Take 1 tablet (50 mg total) by mouth at bedtime as needed for sleep. 10/07/16   Money, Lowry Ram, FNP    Allergies    Bactrim, Blue dyes (parenteral), Codeine, Penicillins, Aspirin, and Ibuprofen  Review of Systems   Review of Systems  Constitutional: Negative for chills and fever.  Musculoskeletal: Positive for arthralgias.  Neurological: Negative for weakness and numbness.  All other systems reviewed and are negative.   Physical Exam Updated Vital Signs BP 135/74 (BP Location: Left Arm)   Pulse 93   Temp 98.4 F (36.9 C) (Oral)   Resp 16   Ht 5' (1.524 m)   Wt 72.6 kg   LMP 04/12/2011 Comment: neg hcg on 07/25/2019  SpO2 96%   BMI 31.25 kg/m   Physical Exam Vitals and nursing note reviewed.  Constitutional:      Appearance: She is not ill-appearing.  HENT:     Head: Normocephalic and atraumatic.  Eyes:     Conjunctiva/sclera: Conjunctivae normal.  Cardiovascular:     Rate and Rhythm: Normal rate and regular rhythm.  Pulmonary:     Effort: Pulmonary effort is normal.     Breath sounds: Normal breath sounds. No wheezing, rhonchi or rales.  Musculoskeletal:     Comments: + Diffuse TTP to the right shoulder joint. Active and passive ROM limited s/2 pain. Pt able to lift arm approximately 45 degrees before pain beginning. Strength 4/5 secondary to pain. Sensation intact throughout. 2+ radial pulse.   Skin:    General: Skin is warm and dry.     Coloration: Skin is not jaundiced.  Neurological:     Mental Status: She is alert.     ED Results / Procedures / Treatments   Labs (all labs  ordered are listed, but only abnormal results are displayed) Labs Reviewed - No data to display  EKG None  Radiology No results found.  Procedures Procedures (including critical care time)  Medications Ordered in ED Medications  HYDROcodone-acetaminophen (NORCO/VICODIN) 5-325 MG per tablet 1 tablet (has no administration  in time range)    ED Course  I have reviewed the triage vital signs and the nursing notes.  Pertinent labs & imaging results that were available during my care of the patient were reviewed by me and considered in my medical decision making (see chart for details).    MDM Rules/Calculators/A&P                          45 year old female presents to the ED today complaining of worsening right shoulder pain after being diagnosed with a torn rotator cuff several months ago by Dr. Ninfa Linden with orthopedics.  Plan for MRI however her insurance was changed and it would not cover it.  She is planning to follow back up with orthopedist however call them today and was told to come to the ED for further evaluation.  On arrival vitals are stable.  Patient is afebrile, nontachycardic and nontachypneic.  She does appear uncomfortable on exam today however is in no acute distress.  She has very limited range of motion both passively and actively of her right shoulder secondary to pain.  She denies any new trauma to the shoulder.  She is neurovascularly intact throughout.  I do not feel she needs a repeat x-ray at this time.  She will ultimately need to follow-up with Dr. Ninfa Linden for MRI and surgical repair of her rotator cuff.  We will provide a very short course of Vicodin today however patient is instructed that this will only be a couple of days worth and she will ultimately need to see orthopedics for further recommendations.  Patient is very reasonable and is understanding.  She is stable for discharge.   This note was prepared using Dragon voice recognition software and may  include unintentional dictation errors due to the inherent limitations of voice recognition software.  Final Clinical Impression(s) / ED Diagnoses Final diagnoses:  Right shoulder pain, unspecified chronicity    Rx / DC Orders ED Discharge Orders         Ordered    HYDROcodone-acetaminophen (NORCO/VICODIN) 5-325 MG tablet  Every 6 hours PRN        04/16/20 2237           Discharge Instructions     Please call Dr. Ninfa Linden office tomorrow morning to schedule a follow up appointment/reschedule of your MRI. Your pain will likely be present until surgical repair.   I have prescribed a very short course of pain medication to take for severe pain.        Eustaquio Maize, PA-C 04/16/20 2238    Sherwood Gambler, MD 04/16/20 [phone]

## 2020-04-16 NOTE — Telephone Encounter (Signed)
Pt called stating she was supposed to have an MRI but didn't have coverage so everything was canceled but the pt now has [facility] and she would like a new referral for an MRI please. Pt did state she is in a lot of pain so she would like to do this as soon as possible.   [phone]

## 2020-04-17 ENCOUNTER — Other Ambulatory Visit: Payer: Self-pay | Admitting: Orthopaedic Surgery

## 2020-04-17 ENCOUNTER — Other Ambulatory Visit: Payer: Self-pay

## 2020-04-17 DIAGNOSIS — Z96611 Presence of right artificial shoulder joint: Secondary | ICD-10-CM

## 2020-04-17 MED ORDER — HYDROCODONE-ACETAMINOPHEN 5-325 MG PO TABS: 2.0000 | ORAL_TABLET | Freq: Four times a day (QID) | ORAL | 0 refills | Status: AC | PRN

## 2020-04-17 MED ORDER — TIZANIDINE HCL 4 MG PO TABS: 4.0000 mg | ORAL_TABLET | Freq: Three times a day (TID) | ORAL | 0 refills | Status: DC | PRN

## 2020-04-17 NOTE — Telephone Encounter (Signed)
Patient was evaluated by Eustaquio Maize, PA-C 2 days ago.  She was given a prescription for hydrocodone.  I was informed by Roxan Hockey, that patient did not receive this medication.  I therefore reordered this medication to her pharmacy.  Please see the information attached.  A total of 10 pills have been dispensed for patient.  [place] ([place]), [place] - [address]  [address] [facility] [place] [address]  Phone:  [phone] Fax:  [phone]    Portions of this note were generated with Dragon dictation software. Dictation errors may occur despite best attempts at proofreading.

## 2020-04-17 NOTE — Telephone Encounter (Signed)
Order put in for MRI 

## 2020-04-22 ENCOUNTER — Ambulatory Visit (INDEPENDENT_AMBULATORY_CARE_PROVIDER_SITE_OTHER): Payer: 59 | Admitting: Physician Assistant

## 2020-04-22 ENCOUNTER — Encounter: Payer: Self-pay | Admitting: Physician Assistant

## 2020-04-22 DIAGNOSIS — M25511 Pain in right shoulder: Secondary | ICD-10-CM | POA: Diagnosis not present

## 2020-04-22 MED ORDER — METHOCARBAMOL 500 MG PO TABS: 500.0000 mg | ORAL_TABLET | Freq: Two times a day (BID) | ORAL | 0 refills | Status: DC

## 2020-04-22 NOTE — Progress Notes (Addendum)
HPI: Lam returns today due to right shoulder pain.  Again this was an on-the-job injury.  She is having pain about the right shoulder girdle and down to the elbow.  She is awaiting an MRI of her right shoulder.  She states that she was unable to get the Robaxin due to the fact that it was sent to the wrong pharmacy.  She is also asking for sling for her arm.  Physical exam: General well-developed well-nourished female no acute distress. Right shoulder: Shoulder is located with internal or external rotation any attempts of movement causes significant pain.  She is tender bicipital groove down into the biceps muscle belly and into the distal biceps tendon.  Distal tendon biceps is without palpable deficit.  There is no ecchymosis involving the arm.  She has significant pain with any attempts of movement.  She is able to extend the elbow up to full extension and flex the arm past 90.  Impression: Left shoulder pain regular to work she is able to return to work on 04/24/2020 light duties no heavy lifting with the right arm.  Will reevaluate her work status after the MRI.  Plan: We will send the Robaxin and to the pharmacy again for her today.  She is given a sling.  I did discuss with her coming out of the sling for range of motion the elbow forearm wrist and hand.  We will see her back a week after her MRI to go over the results and discuss further treatment.  Questions encouraged and answered at length.  She is written out of work until 04/24/2020.  She will return at work on Friday the 28th and at that point in time she will be light duty no heavy lifting with the right arm.  We will reevaluate her work status at her next office visit after the MRI.

## 2020-04-27 NOTE — Telephone Encounter (Signed)
Please advise 

## 2020-04-29 ENCOUNTER — Telehealth: Payer: Self-pay

## 2020-04-29 NOTE — Telephone Encounter (Signed)
Note completed 

## 2020-04-29 NOTE — Telephone Encounter (Signed)
error 

## 2020-05-04 ENCOUNTER — Telehealth: Payer: Self-pay | Admitting: Orthopaedic Surgery

## 2020-05-04 ENCOUNTER — Other Ambulatory Visit: Payer: Self-pay | Admitting: Orthopaedic Surgery

## 2020-05-04 MED ORDER — DIAZEPAM 5 MG PO TABS
5.0000 mg | ORAL_TABLET | Freq: Once | ORAL | 0 refills | Status: AC
Start: 2020-05-04 — End: 2020-05-04

## 2020-05-04 NOTE — Telephone Encounter (Signed)
Patient called requesting Dr. Ninfa Linden to send medication she needs for her MRI appt on tomorrow. Patient states that the [facility] on [address] is giving her a hard time and would like medication sent to [facility] in [facility] . Please call patient at [phone].

## 2020-05-04 NOTE — Telephone Encounter (Signed)
Called in to different pharmacy

## 2020-05-04 NOTE — Telephone Encounter (Signed)
Lvm informing pt.

## 2020-05-04 NOTE — Telephone Encounter (Signed)
Pt called and her prescription is not at the right [facility]. She said that she wants the medication to the one on [address]. CB [phone]

## 2020-05-04 NOTE — Telephone Encounter (Signed)
I sent in a Valium for her to take 30 minutes prior to her MRI.

## 2020-05-04 NOTE — Telephone Encounter (Signed)
Sent to [facility] [address]  

## 2020-05-04 NOTE — Telephone Encounter (Signed)
Pt states that she is claustrophobic and has a MRI tomorrow. CB [phone]

## 2020-05-05 ENCOUNTER — Other Ambulatory Visit: Payer: Self-pay

## 2020-05-05 ENCOUNTER — Ambulatory Visit
Admission: RE | Admit: 2020-05-05 | Discharge: 2020-05-05 | Disposition: A | Discharge status: Home / Self Care | Payer: 59 | Source: Ambulatory Visit | Attending: Orthopaedic Surgery | Admitting: Orthopaedic Surgery

## 2020-05-05 DIAGNOSIS — Z96611 Presence of right artificial shoulder joint: Secondary | ICD-10-CM

## 2020-05-12 ENCOUNTER — Ambulatory Visit (INDEPENDENT_AMBULATORY_CARE_PROVIDER_SITE_OTHER): Payer: 59 | Admitting: Orthopaedic Surgery

## 2020-05-12 ENCOUNTER — Other Ambulatory Visit: Payer: Self-pay

## 2020-05-12 ENCOUNTER — Encounter: Payer: Self-pay | Admitting: Orthopaedic Surgery

## 2020-05-12 DIAGNOSIS — M25511 Pain in right shoulder: Secondary | ICD-10-CM | POA: Diagnosis not present

## 2020-05-12 DIAGNOSIS — M7541 Impingement syndrome of right shoulder: Secondary | ICD-10-CM

## 2020-05-12 MED ORDER — LIDOCAINE HCL 1 % IJ SOLN
3.0000 mL | INTRAMUSCULAR | Status: AC | PRN
  Administered 2020-05-12: 3 mL

## 2020-05-12 MED ORDER — METHYLPREDNISOLONE ACETATE 40 MG/ML IJ SUSP
40.0000 mg | INTRAMUSCULAR | Status: AC | PRN
  Administered 2020-05-12: 40 mg via INTRA_ARTICULAR

## 2020-05-12 NOTE — Progress Notes (Signed)
Office Visit Note   Patient: Valerie Lee           Date of Birth: 08-28-1975           MRN: [id number] Visit Date: 05/12/2020              Requested by: No referring provider defined for this encounter. PCP: Patient, No Pcp Per   Assessment & Plan: Visit Diagnoses:  1. Acute pain of right shoulder   2. Impingement syndrome of right shoulder     Plan: I gave the patient reassurance that she does not need surgery on her right shoulder.  Offered her steroid injection and physical therapy which she agreed to.  She tolerated the steroid injection very easily and can move her shoulder easily afterwards.  I am not sure as to what drives the pain in her right shoulder and she states that sometimes she wakes up screaming in pain.  Again there is no blocks or rotation or weakness in the shoulder and her pain is out of proportion of exam.  Hopefully between a steroid injection and therapy as well as work restrictions including no overhead activities and no lifting greater than 10 to 15 pounds will help the shoulder improved with time.  We will see her back in about 2 months for repeat evaluation.  Follow-Up Instructions: Return in about 2 months (around 07/10/2020).   Orders:  Orders Placed This Encounter  Procedures  . Large Joint Inj   No orders of the defined types were placed in this encounter.     Procedures: Large Joint Inj: R subacromial bursa on 05/12/2020 8:47 AM Indications: pain and diagnostic evaluation Details: 22 G 1.5 in needle  Arthrogram: No  Medications: 3 mL lidocaine 1 %; 40 mg methylPREDNISolone acetate 40 MG/ML Outcome: tolerated well, no immediate complications Procedure, treatment alternatives, risks and benefits explained, specific risks discussed. Consent was given by the patient. Immediately prior to procedure a time out was called to verify the correct patient, procedure, equipment, support staff and site/side marked as required. Patient was prepped  and draped in the usual sterile fashion.       Clinical Data: No additional findings.   Subjective: Chief Complaint  Patient presents with  . Right Shoulder - Follow-up  The patient comes in today to go over the MRI of her right shoulder.  She has severe right shoulder pain although she is able to move her shoulder around.  She says yesterday the shoulder was swollen and she comes in and out of the sling.  She has been to the ER as well due to severe pain.  However she appears very comfortable in the office today.  HPI  Review of Systems There is currently no fever, chills, nausea, vomiting  Objective: Vital Signs: LMP 04/12/2011 Comment: neg hcg on 07/25/2019  Physical Exam She is alert and orient x3 and in no acute distress Ortho Exam On my examination of her right shoulder her shoulder moves smoothly and fluidly and she has a significant of pain out of portion of exam.  Even when I just barely touch the posterior lateral aspect of her shoulder she winces in pain.  There is no redness there is no bruising or swelling about her shoulder.  I can move it easily. Specialty Comments:  No specialty comments available.  Imaging: No results found. The MRI of her right shoulder shows slight interstitial tear of the supraspinatus tendon but this is interstitial.  There is tendinosis  of the rotator cuff as well.  She has a type II acromion and some arthritic changes of the [facility] joint.  PMFS History: Patient Active Problem List   Diagnosis Date Noted  . Marijuana abuse 10/05/2016  . Paranoid ideation (HCC) 10/04/2016  . Hyperprolactinemia (HCC) 11/28/2014  . Schizoaffective disorder, mixed type (HCC) 11/24/2014  . Fibroid uterus 05/12/2011  . Pelvic pain 05/12/2011   Past Medical History:  Diagnosis Date  . Anemia   . Anxiety   . Asthma    rarely uses albuterol rescue  . Bipolar 1 disorder (HCC)   . Blood transfusion   . Depression   . G6PD (glucose 6 phosphatase deficiency)    . G6PD deficiency anemia (HCC)   . Mental disorder   . Paranoia (HCC)   . Schizophrenia (HCC)   . Schizophrenia (HCC)     Family History  Problem Relation Age of Onset  . Hypertension Mother     Past Surgical History:  Procedure Laterality Date  . ABDOMINAL HYSTERECTOMY  05/26/2011   Procedure: HYSTERECTOMY ABDOMINAL;  Surgeon: Catalina Antigua, MD;  Location: [facility];  Service: Gynecology;  Laterality: N/A;  . CHOLECYSTECTOMY  2011  . TUBAL LIGATION    . TUBAL LIGATION     Social History   Occupational History  . Not on file  Tobacco Use  . Smoking status: Former Smoker    Packs/day: 0.25    Years: 24.00    Pack years: 6.00    Types: Cigarettes  . Smokeless tobacco: Never Used  Vaping Use  . Vaping Use: Never used  Substance and Sexual Activity  . Alcohol use: No  . Drug use: No  . Sexual activity: Yes    Birth control/protection: Surgical

## 2020-05-25 ENCOUNTER — Ambulatory Visit (INDEPENDENT_AMBULATORY_CARE_PROVIDER_SITE_OTHER): Payer: 59 | Admitting: Physical Therapy

## 2020-05-25 ENCOUNTER — Encounter: Payer: Self-pay | Admitting: Physical Therapy

## 2020-05-25 ENCOUNTER — Other Ambulatory Visit: Payer: Self-pay

## 2020-05-25 DIAGNOSIS — M25611 Stiffness of right shoulder, not elsewhere classified: Secondary | ICD-10-CM

## 2020-05-25 DIAGNOSIS — M25511 Pain in right shoulder: Secondary | ICD-10-CM

## 2020-05-25 DIAGNOSIS — M6281 Muscle weakness (generalized): Secondary | ICD-10-CM

## 2020-05-25 NOTE — Therapy (Signed)
[facility] Therapy [address] [place], [place], [address] Phone: [phone]   Fax:  [phone]  Physical Therapy Evaluation  Patient Details  Name: Valerie Lee MRN: [id number] Date of Birth: Aug 21, 1975 Referring Provider (PT): Jean Rosenthal   Encounter Date: 05/25/2020   PT End of Session - 05/25/20 1536    Visit Number 1    Number of Visits 16    Date for PT Re-Evaluation 07/17/20    Authorization Type [employer]    PT Start Time 3235    PT Stop Time 1520    PT Time Calculation (min) 47 min    Activity Tolerance Patient limited by pain    Behavior During Therapy Anxious           Past Medical History:  Diagnosis Date  . Anemia   . Anxiety   . Asthma    rarely uses albuterol rescue  . Bipolar 1 disorder ([place])   . Blood transfusion   . Depression   . G6PD (glucose 6 phosphatase deficiency)   . G6PD deficiency anemia ([place])   . Mental disorder   . Paranoia ([place])   . Schizophrenia ([place])   . Schizophrenia [facility])     Past Surgical History:  Procedure Laterality Date  . ABDOMINAL HYSTERECTOMY  05/26/2011   Procedure: HYSTERECTOMY ABDOMINAL;  Surgeon: Mora Bellman, MD;  Location: [facility];  Service: Gynecology;  Laterality: N/A;  . CHOLECYSTECTOMY  2011  . TUBAL LIGATION    . TUBAL LIGATION      There were no vitals filed for this visit.    Subjective Assessment - 05/25/20 1440    Subjective Pt arriving to therapy reporting history of Right shoulder pain since May of 2021. Pt dx with impingment. Pt started working at [employer] in June of 2021. Pt reporting 2 months ago pt's arm began to swell and she is now unable to lift her arm. Pt reports she has been doing most of her ADL's with her L UE. Pt reporting Dr. Ninfa Linden placed her on lifting restrictions but she is still required to lift and reach over head at work.    Pertinent History dx: impingment syndrome: mild supraspinatus insertion tear, AC joint mild degenerative  changes    Limitations Lifting;House hold activities;Other (comment)    Diagnostic tests TDD:UKGU supraspinatus insertion tear, AC joint mild degenerative changes    Patient Stated Goals Stop hurting, work without pain    Currently in Pain? 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    Referring Provider (PT) Jean Rosenthal    Onset Date/Surgical Date --   May 2021   Hand Dominance Right    Next MD Visit 8 weeks from last visit    Prior Therapy No      Precautions   Precautions Shoulder    Type of Shoulder Precautions no lifting > 15 pounds  Comorbidity 3+    Comorbidities anxiety, bipolar, schizophrenia, anemia, asthma    Examination-Activity Limitations Dressing;Lift;Reach Overhead;Other    Examination-Participation Restrictions Church;Community Activity;Driving;Other;Occupation    Stability/Clinical Decision Making Stable/Uncomplicated    Clinical Decision Making Low    Rehab Potential Fair    PT Frequency 2x / week    PT Duration 8 weeks    PT Treatment/Interventions ADLs/Self Care Home Management;Cryotherapy;Electrical Stimulation;Iontophoresis 4mg /ml Dexamethasone;Moist Heat;Ultrasound;Functional mobility training;Therapeutic activities;Therapeutic exercise;Neuromuscular re-education;Patient/family education;Manual techniques;Passive range of motion;Dry needling;Taping    PT Next Visit Plan assess PROM if pt can tolerate, shoulder ROM, shoulder mobs if tolreable, try E-stim    PT Home Exercise Plan Access Code: [email or web address]  URL: [email or web address]  Date: 05/25/2020  Prepared by: Kearney Hard    Exercises  Seated Shoulder Flexion Towel Slide at Table Top - 3 x daily - 7 x weekly - 10 reps - 3 seconds hold  Seated Shoulder Scaption Slide at Table Top with Forearm in Neutral - 3 x daily - 7 x weekly - 10 reps - 3 seconds hold  Seated Shoulder External Rotation PROM on Table - 3 x daily - 7 x  weekly - 10 reps - 3 seconds hold  Circular Shoulder Pendulum with Table Support - 3 x daily - 7 x weekly - 30 reps  Seated Gentle Upper Trapezius Stretch - 3 x daily - 7 x weekly - 5 reps - 10 seconds hold    Consulted and Agree with Plan of Care Patient           Patient will benefit from skilled therapeutic intervention in order to improve the following deficits and impairments:  Pain,Postural dysfunction,Impaired UE functional use,Decreased range of motion,Decreased strength,Increased edema  Visit Diagnosis: Acute pain of right shoulder  Stiffness of right shoulder, not elsewhere classified  Muscle weakness (generalized)     Problem List Patient Active Problem List   Diagnosis Date Noted  . Marijuana abuse 10/05/2016  . Paranoid ideation ([place]) 10/04/2016  . 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 Schizoaffective disorder, mixed type ([place]) 11/24/2014  . Fibroid uterus 05/12/2011  . Pelvic pain 05/12/2011    Oretha Caprice, PT, MPT 05/25/2020, 4:29 PM  [facility] Physical Therapy [address] [place], [place], [address] Phone: [phone]   Fax:  [phone]  Name: Valerie Lee MRN: [id number] Date of Birth: 07-08-1975  Comorbidity 3+    Comorbidities anxiety, bipolar, schizophrenia, anemia, asthma    Examination-Activity Limitations Dressing;Lift;Reach Overhead;Other    Examination-Participation Restrictions Church;Community Activity;Driving;Other;Occupation    Stability/Clinical Decision Making Stable/Uncomplicated    Clinical Decision Making Low    Rehab Potential Fair    PT Frequency 2x / week    PT Duration 8 weeks    PT Treatment/Interventions ADLs/Self Care Home Management;Cryotherapy;Electrical Stimulation;Iontophoresis 4mg /ml Dexamethasone;Moist Heat;Ultrasound;Functional mobility training;Therapeutic activities;Therapeutic exercise;Neuromuscular re-education;Patient/family education;Manual techniques;Passive range of motion;Dry needling;Taping    PT Next Visit Plan assess PROM if pt can tolerate, shoulder ROM, shoulder mobs if tolreable, try E-stim    PT Home Exercise Plan Access Code: [email or web address]  URL: [email or web address]  Date: 05/25/2020  Prepared by: Kearney Hard    Exercises  Seated Shoulder Flexion Towel Slide at Table Top - 3 x daily - 7 x weekly - 10 reps - 3 seconds hold  Seated Shoulder Scaption Slide at Table Top with Forearm in Neutral - 3 x daily - 7 x weekly - 10 reps - 3 seconds hold  Seated Shoulder External Rotation PROM on Table - 3 x daily - 7 x  weekly - 10 reps - 3 seconds hold  Circular Shoulder Pendulum with Table Support - 3 x daily - 7 x weekly - 30 reps  Seated Gentle Upper Trapezius Stretch - 3 x daily - 7 x weekly - 5 reps - 10 seconds hold    Consulted and Agree with Plan of Care Patient           Patient will benefit from skilled therapeutic intervention in order to improve the following deficits and impairments:  Pain,Postural dysfunction,Impaired UE functional use,Decreased range of motion,Decreased strength,Increased edema  Visit Diagnosis: Acute pain of right shoulder  Stiffness of right shoulder, not elsewhere classified  Muscle weakness (generalized)     Problem List Patient Active Problem List   Diagnosis Date Noted  . Marijuana abuse 10/05/2016  . Paranoid ideation ([place]) 10/04/2016  . 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 Schizoaffective disorder, mixed type ([place]) 11/24/2014  . Fibroid uterus 05/12/2011  . Pelvic pain 05/12/2011    Oretha Caprice, PT, MPT 05/25/2020, 4:29 PM  [facility] Therapy [address] [place], [place], [address] Phone: [phone]   Fax:  [phone]  Name: Valerie Lee MRN: [id number] Date of Birth: 07-08-1975  Comorbidity 3+    Comorbidities anxiety, bipolar, schizophrenia, anemia, asthma    Examination-Activity Limitations Dressing;Lift;Reach Overhead;Other    Examination-Participation Restrictions Church;Community Activity;Driving;Other;Occupation    Stability/Clinical Decision Making Stable/Uncomplicated    Clinical Decision Making Low    Rehab Potential Fair    PT Frequency 2x / week    PT Duration 8 weeks    PT Treatment/Interventions ADLs/Self Care Home Management;Cryotherapy;Electrical Stimulation;Iontophoresis 4mg /ml Dexamethasone;Moist Heat;Ultrasound;Functional mobility training;Therapeutic activities;Therapeutic exercise;Neuromuscular re-education;Patient/family education;Manual techniques;Passive range of motion;Dry needling;Taping    PT Next Visit Plan assess PROM if pt can tolerate, shoulder ROM, shoulder mobs if tolreable, try E-stim    PT Home Exercise Plan Access Code: [email or web address]  URL: [email or web address]  Date: 05/25/2020  Prepared by: Kearney Hard    Exercises  Seated Shoulder Flexion Towel Slide at Table Top - 3 x daily - 7 x weekly - 10 reps - 3 seconds hold  Seated Shoulder Scaption Slide at Table Top with Forearm in Neutral - 3 x daily - 7 x weekly - 10 reps - 3 seconds hold  Seated Shoulder External Rotation PROM on Table - 3 x daily - 7 x  weekly - 10 reps - 3 seconds hold  Circular Shoulder Pendulum with Table Support - 3 x daily - 7 x weekly - 30 reps  Seated Gentle Upper Trapezius Stretch - 3 x daily - 7 x weekly - 5 reps - 10 seconds hold    Consulted and Agree with Plan of Care Patient           Patient will benefit from skilled therapeutic intervention in order to improve the following deficits and impairments:  Pain,Postural dysfunction,Impaired UE functional use,Decreased range of motion,Decreased strength,Increased edema  Visit Diagnosis: Acute pain of right shoulder  Stiffness of right shoulder, not elsewhere classified  Muscle weakness (generalized)     Problem List Patient Active Problem List   Diagnosis Date Noted  . Marijuana abuse 10/05/2016  . Paranoid ideation ([place]) 10/04/2016  . 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 Schizoaffective disorder, mixed type ([place]) 11/24/2014  . Fibroid uterus 05/12/2011  . Pelvic pain 05/12/2011    Oretha Caprice, PT, MPT 05/25/2020, 4:29 PM  [facility] [address] [place], [place], [address] Phone: [phone]   Fax:  [phone]  Name: Valerie Lee MRN: [id number] Date of Birth: 07-08-1975

## 2020-06-03 ENCOUNTER — Telehealth: Payer: Self-pay | Admitting: Rehabilitative and Restorative Service Providers"

## 2020-06-03 ENCOUNTER — Encounter: Payer: 59 | Admitting: Rehabilitative and Restorative Service Providers"

## 2020-06-03 NOTE — Telephone Encounter (Signed)
Called Valerie Lee.  She says she did not know she had an appointment today but she does have and plan on attending her Monday 3/14 appointment at 3:15.

## 2020-06-08 ENCOUNTER — Encounter: Payer: Self-pay | Admitting: Physical Therapy

## 2020-06-08 ENCOUNTER — Other Ambulatory Visit: Payer: Self-pay

## 2020-06-08 ENCOUNTER — Ambulatory Visit (INDEPENDENT_AMBULATORY_CARE_PROVIDER_SITE_OTHER): Payer: 59 | Admitting: Physical Therapy

## 2020-06-08 DIAGNOSIS — M25511 Pain in right shoulder: Secondary | ICD-10-CM

## 2020-06-08 DIAGNOSIS — M6281 Muscle weakness (generalized): Secondary | ICD-10-CM | POA: Diagnosis not present

## 2020-06-08 DIAGNOSIS — M25611 Stiffness of right shoulder, not elsewhere classified: Secondary | ICD-10-CM

## 2020-06-08 NOTE — Therapy (Addendum)
[facility] [address] [place], [place], [address] Phone: [phone]   Fax:  [phone]  Physical Therapy Treatment/Discharge  Patient Details  Name: Valerie Lee MRN: [id number] Date of Birth: 11-17-1975 Referring Provider (PT): Valerie Lee   Encounter Date: 06/08/2020   PT End of Session - 06/08/20 1521     Visit Number 2    Number of Visits 16    Date for PT Re-Evaluation 07/17/20    Authorization Type [employer]    PT Start Time 1450    PT Stop Time 1530    PT Time Calculation (min) 40 min    Activity Tolerance Patient limited by pain    Behavior During Therapy Anxious             Past Medical History:  Diagnosis Date   Anemia    Anxiety    Asthma    rarely uses albuterol rescue   Bipolar 1 disorder ([place])    Blood transfusion    Depression    G6PD (glucose 6 phosphatase deficiency)    G6PD deficiency anemia ([place])    Mental disorder    Paranoia ([place])    Schizophrenia ([place])    Schizophrenia ([place])     Past Surgical History:  Procedure Laterality Date   ABDOMINAL HYSTERECTOMY  05/26/2011   Procedure: HYSTERECTOMY ABDOMINAL;  Surgeon: Valerie Bellman, MD;  Location: [facility];  Service: Gynecology;  Laterality: N/A;   CHOLECYSTECTOMY  2011   TUBAL LIGATION     TUBAL LIGATION      There were no vitals filed for this visit.   Subjective Assessment - 06/08/20 1519     Subjective Pt arriving today to therapy reproting difficulty with her HEP frequency due to long work schedule and being in so much pain after work she can't tolerate exercises. Pt reporting still lifting heavy boxes at work which aggreviates her symptoms.    Pertinent History dx: impingment syndrome: mild supraspinatus insertion tear, AC joint mild degenerative changes    Limitations Lifting;House hold activities;Other (comment)    Diagnostic tests TFT:DDUK supraspinatus insertion tear, AC joint mild degenerative changes    Patient  Stated Goals Stop hurting, work without pain    Currently in Pain? Yes    Pain Score 8     Pain Location Shoulder    Pain Orientation Right    Pain Descriptors / Indicators Aching;Throbbing    Pain Type Chronic pain    Pain Onset More than a month ago    Pain Frequency Constant                               [facility] Adult PT Treatment/Exercise - 06/08/20 0001       Exercises   Exercises Shoulder      Shoulder Exercises: Supine   External Rotation AAROM;10 reps   2 sets   Flexion AAROM;10 reps      Shoulder Exercises: Seated   Other Seated Exercises table slides flexion, ER and scaption x 10 holding 5 seconds each      Shoulder Exercises: Pulleys   Flexion 2 minutes    Scaption 2 minutes      Shoulder Exercises: Isometric Strengthening   Flexion 5X5"    Extension 5X5"    External Rotation 5X5"    Internal Rotation 5X5"      Shoulder Exercises: Stretch   Internal Rotation Stretch 3 reps    Internal Rotation Stretch  can tolerate, shoulder ROM, shoulder mobs if tolreable, try E-stim with ligth weight moiste heat pack    PT Home Exercise Plan Access Code: [email or web address]  URL: [email or web address]  Date: 05/25/2020  Prepared by: Kearney Hard    Exercises  Seated Shoulder Flexion Towel Slide at Table Top - 3 x daily - 7 x weekly - 10 reps - 3 seconds hold  Seated Shoulder Scaption Slide at Table Top with Forearm in Neutral - 3 x daily - 7 x weekly - 10 reps - 3 seconds hold  Seated Shoulder External Rotation PROM on Table - 3 x daily - 7 x weekly - 10 reps - 3 seconds hold  Circular Shoulder Pendulum with Table Support - 3 x daily - 7 x weekly - 30 reps  Seated Gentle Upper Trapezius Stretch - 3 x daily - 7 x weekly - 5 reps - 10 seconds hold    Consulted and Agree with Plan of Care Patient             Patient will benefit from skilled therapeutic intervention in order to improve the following deficits and impairments:  Pain,Postural dysfunction,Impaired UE functional use,Decreased range of motion,Decreased strength,Increased edema  Visit Diagnosis: Acute pain of right shoulder  Stiffness of right shoulder, not elsewhere classified  Muscle weakness (generalized)     Problem List Patient Active Problem List   Diagnosis Date Noted   Marijuana abuse 10/05/2016   Paranoid ideation ([place]) 10/04/2016   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 Patient goals were  partially met. Patient is being discharged due to not returning since the last visit.  Valerie Lee, PT, DPT, OCS, ATC 09/14/20  10:18 AM     [facility] [address] [place], [place], [address] Phone: [phone]   Fax:  [phone]  Name: Valerie Lee MRN: [id number] Date of Birth: [birth year]  [facility] [address] [place], [place], [address] Phone: [phone]   Fax:  [phone]  Physical Therapy Treatment/Discharge  Patient Details  Name: Valerie Lee MRN: [id number] Date of Birth: 11-17-1975 Referring Provider (PT): Valerie Lee   Encounter Date: 06/08/2020   PT End of Session - 06/08/20 1521     Visit Number 2    Number of Visits 16    Date for PT Re-Evaluation 07/17/20    Authorization Type [employer]    PT Start Time 1450    PT Stop Time 1530    PT Time Calculation (min) 40 min    Activity Tolerance Patient limited by pain    Behavior During Therapy Anxious             Past Medical History:  Diagnosis Date   Anemia    Anxiety    Asthma    rarely uses albuterol rescue   Bipolar 1 disorder ([place])    Blood transfusion    Depression    G6PD (glucose 6 phosphatase deficiency)    G6PD deficiency anemia ([place])    Mental disorder    Paranoia ([place])    Schizophrenia ([place])    Schizophrenia ([place])     Past Surgical History:  Procedure Laterality Date   ABDOMINAL HYSTERECTOMY  05/26/2011   Procedure: HYSTERECTOMY ABDOMINAL;  Surgeon: Valerie Bellman, MD;  Location: [facility];  Service: Gynecology;  Laterality: N/A;   CHOLECYSTECTOMY  2011   TUBAL LIGATION     TUBAL LIGATION      There were no vitals filed for this visit.   Subjective Assessment - 06/08/20 1519     Subjective Pt arriving today to therapy reproting difficulty with her HEP frequency due to long work schedule and being in so much pain after work she can't tolerate exercises. Pt reporting still lifting heavy boxes at work which aggreviates her symptoms.    Pertinent History dx: impingment syndrome: mild supraspinatus insertion tear, AC joint mild degenerative changes    Limitations Lifting;House hold activities;Other (comment)    Diagnostic tests TFT:DDUK supraspinatus insertion tear, AC joint mild degenerative changes    Patient  Stated Goals Stop hurting, work without pain    Currently in Pain? Yes    Pain Score 8     Pain Location Shoulder    Pain Orientation Right    Pain Descriptors / Indicators Aching;Throbbing    Pain Type Chronic pain    Pain Onset More than a month ago    Pain Frequency Constant                               [facility] Adult PT Treatment/Exercise - 06/08/20 0001       Exercises   Exercises Shoulder      Shoulder Exercises: Supine   External Rotation AAROM;10 reps   2 sets   Flexion AAROM;10 reps      Shoulder Exercises: Seated   Other Seated Exercises table slides flexion, ER and scaption x 10 holding 5 seconds each      Shoulder Exercises: Pulleys   Flexion 2 minutes    Scaption 2 minutes      Shoulder Exercises: Isometric Strengthening   Flexion 5X5"    Extension 5X5"    External Rotation 5X5"    Internal Rotation 5X5"      Shoulder Exercises: Stretch   Internal Rotation Stretch 3 reps    Internal Rotation Stretch

## 2020-06-12 ENCOUNTER — Encounter: Payer: 59 | Admitting: Rehabilitative and Restorative Service Providers"

## 2020-06-15 ENCOUNTER — Encounter: Payer: 59 | Admitting: Physical Therapy

## 2020-06-15 ENCOUNTER — Telehealth: Payer: Self-pay | Admitting: Physical Therapy

## 2020-06-15 NOTE — Telephone Encounter (Signed)
LVM for pt following NS for PT appt.  Reminded of next scheduled appt and advised of no show policy if she doesn't show for next visit.  Laureen Abrahams, PT, DPT 06/15/20 3:33 PM

## 2020-06-18 ENCOUNTER — Telehealth: Payer: Self-pay | Admitting: Physical Therapy

## 2020-06-18 ENCOUNTER — Encounter: Payer: 59 | Admitting: Physical Therapy

## 2020-06-18 NOTE — Telephone Encounter (Signed)
LVM for pt about scheduled appt today.  Advised this is 3rd NS and so remaining appts have been canceled.  Provided phone number if she would like to schedule on a visit by visit basis.  Laureen Abrahams, PT, DPT 06/18/20 3:38 PM

## 2020-06-22 ENCOUNTER — Encounter: Payer: 59 | Admitting: Physical Therapy

## 2020-06-25 ENCOUNTER — Encounter: Payer: 59 | Admitting: Rehabilitative and Restorative Service Providers"

## 2020-07-09 ENCOUNTER — Ambulatory Visit: Payer: 59 | Admitting: Orthopaedic Surgery

## 2020-07-20 ENCOUNTER — Ambulatory Visit (INDEPENDENT_AMBULATORY_CARE_PROVIDER_SITE_OTHER): Payer: 59 | Admitting: Orthopaedic Surgery

## 2020-07-20 ENCOUNTER — Encounter: Payer: Self-pay | Admitting: Orthopaedic Surgery

## 2020-07-20 DIAGNOSIS — M25511 Pain in right shoulder: Secondary | ICD-10-CM

## 2020-07-20 DIAGNOSIS — M7541 Impingement syndrome of right shoulder: Secondary | ICD-10-CM | POA: Diagnosis not present

## 2020-07-20 NOTE — Progress Notes (Signed)
The patient is well-known to us.  We have been following her for severe right shoulder pain for now well over 6 months.  She has had at least 2 steroid injections in the subacromial outlet of the shoulder.  She has been on oral over-the-counter anti-inflammatories and prescription dose anti-inflammatories.  She is even had 2 rounds of outpatient physical therapy.  She still reports severe pain with reaching overhead and behind her.  A MRI earlier this year did show significant tendinosis of the rotator cuff combined with AC joint degenerative changes which is set up for severe impingement syndrome.  She is still having significant pain.  Given the fact that she has now failed conservative treatment for over 6 months with all the treatment modalities, we are recommending surgery at this standpoint.  Examination of her right shoulder shows severe pain past 9 degrees of abduction.  Her internal rotation with adduction is limited.  Her crossover and crossarm test is positive for significant impingement.  There is some slight weakness in the rotator cuff.  At this point we have exhausted all forms conservative treatment including oral anti-inflammatories, oral narcotic pain medications, steroid injections x2 in the subacromial outlet, and outpatient physical therapy through several rounds.  She has worked on a home exercise program as well.  Given failure of conservative treatment we are recommending a surgical intervention at this standpoint including a shoulder arthroscopy with subacromial decompression and debridement.  I showed her a shoulder model and explained in detail what the surgery involves.  She is interested in having this at this standpoint given the amount of pain and discomfort she is still experiencing and decreased mobility of that shoulder.  I talked about the risk and benefits of surgery and what to expect from an intraoperative and postoperative course.  All question concerns were answered and  addressed.  We will work on getting surgery scheduled. 

## 2020-07-20 NOTE — H&P (View-Only) (Signed)
The patient is well-known to [place].  We have been following her for severe right shoulder pain for now well over 6 months.  She has had at least 2 steroid injections in the subacromial outlet of the shoulder.  She has been on oral over-the-counter anti-inflammatories and prescription dose anti-inflammatories.  She is even had 2 rounds of outpatient physical therapy.  She still reports severe pain with reaching overhead and behind her.  A MRI earlier this year did show significant tendinosis of the rotator cuff combined with AC joint degenerative changes which is set up for severe impingement syndrome.  She is still having significant pain.  Given the fact that she has now failed conservative treatment for over 6 months with all the treatment modalities, we are recommending surgery at this standpoint.  Examination of her right shoulder shows severe pain past 9 degrees of abduction.  Her internal rotation with adduction is limited.  Her crossover and crossarm test is positive for significant impingement.  There is some slight weakness in the rotator cuff.  At this point we have exhausted all forms conservative treatment including oral anti-inflammatories, oral narcotic pain medications, steroid injections x2 in the subacromial outlet, and outpatient physical therapy through several rounds.  She has worked on a home exercise program as well.  Given failure of conservative treatment we are recommending a surgical intervention at this standpoint including a shoulder arthroscopy with subacromial decompression and debridement.  I showed her a shoulder model and explained in detail what the surgery involves.  She is interested in having this at this standpoint given the amount of pain and discomfort she is still experiencing and decreased mobility of that shoulder.  I talked about the risk and benefits of surgery and what to expect from an intraoperative and postoperative course.  All question concerns were answered and  addressed.  We will work on getting surgery scheduled.

## 2020-07-27 ENCOUNTER — Other Ambulatory Visit: Payer: Self-pay

## 2020-08-03 ENCOUNTER — Other Ambulatory Visit: Payer: Self-pay | Admitting: Physician Assistant

## 2020-08-06 ENCOUNTER — Encounter (HOSPITAL_BASED_OUTPATIENT_CLINIC_OR_DEPARTMENT_OTHER): Payer: Self-pay | Admitting: Orthopaedic Surgery

## 2020-08-06 ENCOUNTER — Other Ambulatory Visit: Payer: Self-pay

## 2020-08-10 ENCOUNTER — Other Ambulatory Visit (HOSPITAL_COMMUNITY): Payer: Self-pay

## 2020-08-11 NOTE — Progress Notes (Signed)

## 2020-08-13 ENCOUNTER — Encounter (HOSPITAL_BASED_OUTPATIENT_CLINIC_OR_DEPARTMENT_OTHER)
Admission: RE | Disposition: A | Discharge status: Home / Self Care | Payer: Self-pay | Source: Home / Self Care | Attending: Orthopaedic Surgery

## 2020-08-13 ENCOUNTER — Other Ambulatory Visit: Payer: Self-pay

## 2020-08-13 ENCOUNTER — Ambulatory Visit (HOSPITAL_BASED_OUTPATIENT_CLINIC_OR_DEPARTMENT_OTHER): Payer: Self-pay | Admitting: Anesthesiology

## 2020-08-13 ENCOUNTER — Ambulatory Visit (HOSPITAL_BASED_OUTPATIENT_CLINIC_OR_DEPARTMENT_OTHER)
Admission: RE | Admit: 2020-08-13 | Discharge: 2020-08-13 | Disposition: A | Discharge status: Home / Self Care | Payer: Self-pay | Attending: Orthopaedic Surgery | Admitting: Orthopaedic Surgery

## 2020-08-13 ENCOUNTER — Encounter (HOSPITAL_BASED_OUTPATIENT_CLINIC_OR_DEPARTMENT_OTHER): Payer: Self-pay | Admitting: Orthopaedic Surgery

## 2020-08-13 DIAGNOSIS — Z880 Allergy status to penicillin: Secondary | ICD-10-CM | POA: Insufficient documentation

## 2020-08-13 DIAGNOSIS — M7551 Bursitis of right shoulder: Secondary | ICD-10-CM | POA: Insufficient documentation

## 2020-08-13 DIAGNOSIS — Z87891 Personal history of nicotine dependence: Secondary | ICD-10-CM | POA: Insufficient documentation

## 2020-08-13 DIAGNOSIS — Z79899 Other long term (current) drug therapy: Secondary | ICD-10-CM | POA: Insufficient documentation

## 2020-08-13 DIAGNOSIS — Z886 Allergy status to analgesic agent status: Secondary | ICD-10-CM | POA: Insufficient documentation

## 2020-08-13 DIAGNOSIS — M7541 Impingement syndrome of right shoulder: Secondary | ICD-10-CM

## 2020-08-13 DIAGNOSIS — M75111 Incomplete rotator cuff tear or rupture of right shoulder, not specified as traumatic: Secondary | ICD-10-CM | POA: Insufficient documentation

## 2020-08-13 DIAGNOSIS — Z881 Allergy status to other antibiotic agents status: Secondary | ICD-10-CM | POA: Insufficient documentation

## 2020-08-13 DIAGNOSIS — Z885 Allergy status to narcotic agent status: Secondary | ICD-10-CM | POA: Insufficient documentation

## 2020-08-13 HISTORY — PX: SHOULDER ARTHROSCOPY: SHX128

## 2020-08-13 SURGERY — ARTHROSCOPY, SHOULDER
Anesthesia: General | Site: Shoulder | Laterality: Right

## 2020-08-13 MED ORDER — LACTATED RINGERS IV SOLN: INTRAVENOUS | Status: DC

## 2020-08-13 MED ORDER — CLINDAMYCIN PHOSPHATE 900 MG/50ML IV SOLN
900.0000 mg | INTRAVENOUS | Status: AC
  Administered 2020-08-13: 900 mg via INTRAVENOUS

## 2020-08-13 MED ORDER — PROPOFOL 10 MG/ML IV BOLUS
INTRAVENOUS | Status: DC | PRN
  Administered 2020-08-13: 200 mg via INTRAVENOUS

## 2020-08-13 MED ORDER — LIDOCAINE HCL (CARDIAC) PF 100 MG/5ML IV SOSY
PREFILLED_SYRINGE | INTRAVENOUS | Status: DC | PRN
  Administered 2020-08-13: 60 mg via INTRAVENOUS

## 2020-08-13 MED ORDER — ONDANSETRON HCL 4 MG/2ML IJ SOLN
INTRAMUSCULAR | Status: AC
  Filled 2020-08-13: qty 2

## 2020-08-13 MED ORDER — FENTANYL CITRATE (PF) 100 MCG/2ML IJ SOLN
100.0000 ug | Freq: Once | INTRAMUSCULAR | Status: AC
  Administered 2020-08-13: 100 ug via INTRAVENOUS

## 2020-08-13 MED ORDER — BUPIVACAINE LIPOSOME 1.3 % IJ SUSP
INTRAMUSCULAR | Status: DC | PRN
  Administered 2020-08-13: 10 mL via PERINEURAL

## 2020-08-13 MED ORDER — PROMETHAZINE HCL 25 MG/ML IJ SOLN: 6.2500 mg | INTRAMUSCULAR | Status: DC | PRN

## 2020-08-13 MED ORDER — ONDANSETRON HCL 4 MG/2ML IJ SOLN
INTRAMUSCULAR | Status: DC | PRN
  Administered 2020-08-13: 4 mg via INTRAVENOUS

## 2020-08-13 MED ORDER — OXYCODONE HCL 5 MG/5ML PO SOLN: 5.0000 mg | Freq: Once | ORAL | Status: DC | PRN

## 2020-08-13 MED ORDER — MEPERIDINE HCL 25 MG/ML IJ SOLN: 6.2500 mg | INTRAMUSCULAR | Status: DC | PRN

## 2020-08-13 MED ORDER — OXYCODONE HCL 5 MG PO TABS: 5.0000 mg | ORAL_TABLET | Freq: Four times a day (QID) | ORAL | 0 refills | Status: DC | PRN

## 2020-08-13 MED ORDER — AMISULPRIDE (ANTIEMETIC) 5 MG/2ML IV SOLN: 10.0000 mg | Freq: Once | INTRAVENOUS | Status: DC | PRN

## 2020-08-13 MED ORDER — OXYCODONE HCL 5 MG PO TABS
5.0000 mg | ORAL_TABLET | Freq: Once | ORAL | Status: DC | PRN
Start: 2020-08-13 — End: 2020-08-13

## 2020-08-13 MED ORDER — PHENYLEPHRINE HCL (PRESSORS) 10 MG/ML IV SOLN
INTRAVENOUS | Status: DC | PRN
  Administered 2020-08-13 (×3): 80 ug via INTRAVENOUS

## 2020-08-13 MED ORDER — DEXAMETHASONE SODIUM PHOSPHATE 4 MG/ML IJ SOLN
INTRAMUSCULAR | Status: DC | PRN
  Administered 2020-08-13: 5 mg via INTRAVENOUS

## 2020-08-13 MED ORDER — ONDANSETRON 4 MG PO TBDP: 4.0000 mg | ORAL_TABLET | Freq: Three times a day (TID) | ORAL | 0 refills | Status: DC | PRN

## 2020-08-13 MED ORDER — CLINDAMYCIN PHOSPHATE 900 MG/50ML IV SOLN
INTRAVENOUS | Status: AC
  Filled 2020-08-13: qty 50

## 2020-08-13 MED ORDER — HYDROMORPHONE HCL 1 MG/ML IJ SOLN: 0.2500 mg | INTRAMUSCULAR | Status: DC | PRN

## 2020-08-13 MED ORDER — MIDAZOLAM HCL 2 MG/2ML IJ SOLN
INTRAMUSCULAR | Status: AC
  Filled 2020-08-13: qty 2

## 2020-08-13 MED ORDER — LIDOCAINE 2% (20 MG/ML) 5 ML SYRINGE
INTRAMUSCULAR | Status: AC
  Filled 2020-08-13: qty 5

## 2020-08-13 MED ORDER — BUPIVACAINE HCL (PF) 0.5 % IJ SOLN
INTRAMUSCULAR | Status: DC | PRN
  Administered 2020-08-13: 20 mL via PERINEURAL

## 2020-08-13 MED ORDER — MIDAZOLAM HCL 2 MG/2ML IJ SOLN
2.0000 mg | Freq: Once | INTRAMUSCULAR | Status: AC
  Administered 2020-08-13: 2 mg via INTRAVENOUS

## 2020-08-13 MED ORDER — FENTANYL CITRATE (PF) 100 MCG/2ML IJ SOLN
INTRAMUSCULAR | Status: AC
  Filled 2020-08-13: qty 2

## 2020-08-13 MED ORDER — DEXAMETHASONE SODIUM PHOSPHATE 10 MG/ML IJ SOLN
INTRAMUSCULAR | Status: AC
  Filled 2020-08-13: qty 1

## 2020-08-13 SURGICAL SUPPLY — 56 items
AID PSTN UNV HD RSTRNT DISP (MISCELLANEOUS) ×1
BLADE SURG 15 STRL LF DISP TIS (BLADE) ×1 IMPLANT
BLADE SURG 15 STRL SS (BLADE) ×2
BURR OVAL 8 FLU 4.0X13 (MISCELLANEOUS) ×2 IMPLANT
BURR OVAL 8 FLU 5.0X13 (MISCELLANEOUS) ×2 IMPLANT
CANNULA TWIST IN 8.25X7CM (CANNULA) IMPLANT
COVER WAND RF STERILE (DRAPES) IMPLANT
DECANTER SPIKE VIAL GLASS SM (MISCELLANEOUS) IMPLANT
DISSECTOR  3.8MM X 13CM (MISCELLANEOUS) ×1
DISSECTOR 3.8MM X 13CM (MISCELLANEOUS) ×1 IMPLANT
DISSECTOR 4.0MM X 13CM (MISCELLANEOUS) IMPLANT
DRAPE IMP U-DRAPE 54X76 (DRAPES) ×2 IMPLANT
DRAPE SHOULDER BEACH CHAIR (DRAPES) ×2 IMPLANT
DRAPE U-SHAPE 47X51 STRL (DRAPES) ×2 IMPLANT
DRSG PAD ABDOMINAL 8X10 ST (GAUZE/BANDAGES/DRESSINGS) ×2 IMPLANT
DURAPREP 26ML APPLICATOR (WOUND CARE) ×2 IMPLANT
ELECT REM PT RETURN 9FT ADLT (ELECTROSURGICAL)
ELECTRODE REM PT RTRN 9FT ADLT (ELECTROSURGICAL) IMPLANT
GAUZE SPONGE 4X4 12PLY STRL (GAUZE/BANDAGES/DRESSINGS) ×2 IMPLANT
GAUZE XEROFORM 1X8 LF (GAUZE/BANDAGES/DRESSINGS) ×2 IMPLANT
GLOVE SRG 8 PF TXTR STRL LF DI (GLOVE) ×2 IMPLANT
GLOVE SURG ENC MOIS LTX SZ7.5 (GLOVE) ×10 IMPLANT
GLOVE SURG ORTHO LTX SZ8 (GLOVE) ×2 IMPLANT
GLOVE SURG UNDER POLY LF SZ8 (GLOVE) ×4
GOWN STRL REUS W/ TWL LRG LVL3 (GOWN DISPOSABLE) ×3 IMPLANT
GOWN STRL REUS W/ TWL XL LVL3 (GOWN DISPOSABLE) IMPLANT
GOWN STRL REUS W/TWL LRG LVL3 (GOWN DISPOSABLE) ×6
GOWN STRL REUS W/TWL XL LVL3 (GOWN DISPOSABLE)
KIT SHOULDER TRACTION (DRAPES) ×2 IMPLANT
MANIFOLD NEPTUNE II (INSTRUMENTS) ×2 IMPLANT
NDL SAFETY ECLIPSE 18X1.5 (NEEDLE) IMPLANT
NEEDLE HYPO 18GX1.5 SHARP (NEEDLE)
NEEDLE SCORPION MULTI FIRE (NEEDLE) IMPLANT
NS IRRIG 1000ML POUR BTL (IV SOLUTION) IMPLANT
PACK ARTHROSCOPY DSU (CUSTOM PROCEDURE TRAY) ×2 IMPLANT
PACK BASIN DAY SURGERY FS (CUSTOM PROCEDURE TRAY) ×2 IMPLANT
PAD ORTHO SHOULDER 7X19 LRG (SOFTGOODS) ×2 IMPLANT
PENCIL SMOKE EVACUATOR (MISCELLANEOUS) IMPLANT
PORT APPOLLO RF 90DEGREE MULTI (SURGICAL WAND) ×2 IMPLANT
RESTRAINT HEAD UNIVERSAL NS (MISCELLANEOUS) ×2 IMPLANT
SLING ARM FOAM STRAP LRG (SOFTGOODS) ×2 IMPLANT
SLING ULTRA II MEDIUM (SOFTGOODS) IMPLANT
SPONGE LAP 4X18 RFD (DISPOSABLE) IMPLANT
SUCTION FRAZIER HANDLE 10FR (MISCELLANEOUS)
SUCTION TUBE FRAZIER 10FR DISP (MISCELLANEOUS) IMPLANT
SUT ETHIBOND 2 OS 4 DA (SUTURE) IMPLANT
SUT ETHILON 3 0 PS 1 (SUTURE) ×2 IMPLANT
SUT VIC AB 2-0 SH 27 (SUTURE)
SUT VIC AB 2-0 SH 27XBRD (SUTURE) IMPLANT
SYR 20ML LL LF (SYRINGE) IMPLANT
SYR BULB EAR ULCER 3OZ GRN STR (SYRINGE) IMPLANT
TAPE HYPAFIX 6X30 (GAUZE/BANDAGES/DRESSINGS) ×2 IMPLANT
TOWEL GREEN STERILE FF (TOWEL DISPOSABLE) ×2 IMPLANT
TUBE CONNECTING 20X1/4 (TUBING) ×4 IMPLANT
TUBING ARTHROSCOPY IRRIG 16FT (MISCELLANEOUS) ×2 IMPLANT
WATER STERILE IRR 1000ML POUR (IV SOLUTION) ×2 IMPLANT

## 2020-08-13 NOTE — Anesthesia Procedure Notes (Signed)
Anesthesia Regional Block: Interscalene brachial plexus block   Pre-Anesthetic Checklist: ,, timeout performed, Correct Patient, Correct Site, Correct Laterality, Correct Procedure, Correct Position, site marked, Risks and benefits discussed,  Surgical consent,  Pre-op evaluation,  At surgeon's request and post-op pain management  Laterality: Right  Prep: chloraprep       Needles:  Injection technique: Single-shot  Needle Type: Stimiplex     Needle Length: 9cm  Needle Gauge: 21     Additional Needles:   Procedures:,,,, ultrasound used (permanent image in chart),,,,  Narrative:  Start time: 08/13/2020 9:35 AM End time: 08/13/2020 9:40 AM Injection made incrementally with aspirations every 5 mL.  Performed by: Personally  Anesthesiologist: Lynda Rainwater, MD

## 2020-08-13 NOTE — Progress Notes (Signed)
Assisted Dr. Miller with right, ultrasound guided, interscalene  block. Side rails up, monitors on throughout procedure. See vital signs in flow sheet. Tolerated Procedure well. 

## 2020-08-13 NOTE — Anesthesia Postprocedure Evaluation (Signed)
Anesthesia Post Note  Patient: Valerie Lee  Procedure(s) Performed: RIGHT SHOULDER ARTHROSCOPY WITH DEBRIDEMENT AND SUBACROMIAL DECOMPRESSION (Right Shoulder)     Patient location during evaluation: PACU Anesthesia Type: General Level of consciousness: awake and alert Pain management: pain level controlled Vital Signs Assessment: post-procedure vital signs reviewed and stable Respiratory status: spontaneous breathing, nonlabored ventilation and respiratory function stable Cardiovascular status: blood pressure returned to baseline and stable Postop Assessment: no apparent nausea or vomiting Anesthetic complications: no   No complications documented.  Last Vitals:  Vitals:   08/13/20 1145 08/13/20 1151  BP: (!) 111/59   Pulse: 95   Resp: 16   Temp:    SpO2: 98% 95%    Last Pain:  Vitals:   08/13/20 1133  TempSrc:   PainSc: Asleep                 Lynda Rainwater

## 2020-08-13 NOTE — Transfer of Care (Signed)
Immediate Anesthesia Transfer of Care Note  Patient: Valerie Lee  Procedure(s) Performed: RIGHT SHOULDER ARTHROSCOPY WITH DEBRIDEMENT AND SUBACROMIAL DECOMPRESSION (Right Shoulder)  Patient Location: PACU  Anesthesia Type:GA combined with regional for post-op pain  Level of Consciousness: sedated  Airway & Oxygen Therapy: Patient Spontanous Breathing and Patient connected to face mask oxygen  Post-op Assessment: Report given to RN and Post -op Vital signs reviewed and stable  Post vital signs: Reviewed and stable  Last Vitals:  Vitals Value Taken Time  BP 105/65 08/13/20 1133  Temp    Pulse 89 08/13/20 1138  Resp 17 08/13/20 1138  SpO2 97 % 08/13/20 1138  Vitals shown include unvalidated device data.  Last Pain:  Vitals:   08/13/20 0858  TempSrc: Oral  PainSc: 5          Complications: No complications documented.

## 2020-08-13 NOTE — Discharge Instructions (Signed)
Do you wear your sling throughout the day today until the block wears off.  The block may not wear off until tomorrow. In the next 1 to 2 days you can remove all your dressings and get the 3 small incisions wet in the shower on the right shoulder daily. After each shower he can place small Band-Aids over those 3 incisions. Do expect some bloody drainage. Come in and out of your sling and move your right shoulder as comfort allows.   Post Anesthesia Home Care Instructions  Activity: Get plenty of rest for the remainder of the day. A responsible individual must stay with you for 24 hours following the procedure.  For the next 24 hours, DO NOT: -Drive a car -Paediatric nurse -Drink alcoholic beverages -Take any medication unless instructed by your physician -Make any legal decisions or sign important papers.  Meals: Start with liquid foods such as gelatin or soup. Progress to regular foods as tolerated. Avoid greasy, spicy, heavy foods. If nausea and/or vomiting occur, drink only clear liquids until the nausea and/or vomiting subsides. Call your physician if vomiting continues.  Special Instructions/Symptoms: Your throat may feel dry or sore from the anesthesia or the breathing tube placed in your throat during surgery. If this causes discomfort, gargle with warm salt water. The discomfort should disappear within 24 hours.  If you had a scopolamine patch placed behind your ear for the management of post- operative nausea and/or vomiting:  1. The medication in the patch is effective for 72 hours, after which it should be removed.  Wrap patch in a tissue and discard in the trash. Wash hands thoroughly with soap and water. 2. You may remove the patch earlier than 72 hours if you experience unpleasant side effects which may include dry mouth, dizziness or visual disturbances. 3. Avoid touching the patch. Wash your hands with soap and water after contact with the patch.    Regional Anesthesia  Blocks  1. Numbness or the inability to move the "blocked" extremity may last from 3-48 hours after placement. The length of time depends on the medication injected and your individual response to the medication. If the numbness is not going away after 48 hours, call your surgeon.  2. The extremity that is blocked will need to be protected until the numbness is gone and the  Strength has returned. Because you cannot feel it, you will need to take extra care to avoid injury. Because it may be weak, you may have difficulty moving it or using it. You may not know what position it is in without looking at it while the block is in effect.  3. For blocks in the legs and feet, returning to weight bearing and walking needs to be done carefully. You will need to wait until the numbness is entirely gone and the strength has returned. You should be able to move your leg and foot normally before you try and bear weight or walk. You will need someone to be with you when you first try to ensure you do not fall and possibly risk injury.  4. Bruising and tenderness at the needle site are common side effects and will resolve in a few days.  5. Persistent numbness or new problems with movement should be communicated to the surgeon or the [place] [phone] [place] [phone]).Information for Discharge Teaching: EXPAREL (bupivacaine liposome injectable suspension)   Your surgeon or anesthesiologist gave you EXPAREL(bupivacaine) to help control your pain after surgery.  EXPAREL is a local anesthetic that provides pain relief by numbing the tissue around the surgical site.  EXPAREL is designed to release pain medication over time and can control pain for up to 72 hours.  Depending on how you respond to EXPAREL, you may require less pain medication during your recovery.  Possible side effects:  Temporary loss of sensation or ability to move in the area where bupivacaine was  injected.  Nausea, vomiting, constipation  Rarely, numbness and tingling in your mouth or lips, lightheadedness, or anxiety may occur.  Call your doctor right away if you think you may be experiencing any of these sensations, or if you have other questions regarding possible side effects.  Follow all other discharge instructions given to you by your surgeon or nurse. Eat a healthy diet and drink plenty of water or other fluids.  If you return to the hospital for any reason within 96 hours following the administration of EXPAREL, it is important for health care providers to know that you have received this anesthetic. A teal colored band has been placed on your arm with the date, time and amount of EXPAREL you have received in order to alert and inform your health care providers. Please leave this armband in place for the full 96 hours following administration, and then you may remove the band.

## 2020-08-13 NOTE — Op Note (Signed)
NAME: Valerie Lee, Valerie Lee MEDICAL RECORD NO: [id number] ACCOUNT NO: [id number] DATE OF BIRTH: 10/22/1975 FACILITY: [facility] LOCATION: [facility] PHYSICIAN: Vanita Panda. Magnus Ivan, MD  Operative Report   DATE OF PROCEDURE: 08/13/2020  PREOPERATIVE DIAGNOSIS:  Right shoulder severe impingement syndrome.  POSTOPERATIVE DIAGNOSIS:  Right shoulder severe impingement syndrome.  PROCEDURE:  Right shoulder arthroscopy with debridement and subacromial decompression.  SURGEON:  Vanita Panda. Magnus Ivan, MD  ASSISTANT:    Rexene Edison, PA-C  ANESTHESIA: 1.  Right shoulder regional block. 2.  General.  BLOOD LOSS:  Minimal.  ANTIBIOTICS:  900 mg IV clindamycin.  COMPLICATIONS:  None.  INDICATIONS:  The patient is a 45 year old female, well known to me.  She has had debilitating pain in her right shoulder that has actually caused her to go to the emergency room a couple of times or at least once.  I have seen her several times in the  office for this shoulder.  Her pain is severe and especially with overhead activities and reaching behind her.  We tried conservative treatment with activity modification, rest, anti-inflammatories, steroid injections, and therapy.  After fairly  conservative treatment, an MRI was obtained of the right shoulder.  She had minimal tearing of the rotator cuff, which was not full thickness, but also evidence of tendinosis of the rotator cuff and impingement with narrowing of the subacromial outlet  and shoulder bursitis.  After continued failure of conservative treatment, she did wish for an arthroscopic intervention.  We talked about the risk of the surgery in detail and described what the surgery involves.  After a thorough discussion, she did  wish to proceed with surgery.  DESCRIPTION OF PROCEDURE:  After informed consent was obtained, appropriate right shoulder was marked.  Anesthesia obtained the right shoulder block in the holding room.  She was then brought to  the operating room and placed supine on the operating  table.  General anesthesia was then obtained.  She was then fashioned to beach chair position with appropriate positioning and padding of the head and neck and padding of the down nonoperative left arm.  There was bending at the waist and knees and  palpable pulses on her feet.  Sequential compression devices were placed on both legs.  Her right operative shoulder was prepped and draped with DuraPrep and sterile drapes.  She was placed in line skeletal traction using a fishing pole traction device  and 10 pounds of traction in neutral rotation and 45 degrees of forward flexion.  A timeout was called, and she was identified as correct patient, correct right shoulder.  I then made a posterolateral arthroscopy portal and entered the glenohumeral  joint.  Fortunately, in the glenohumeral joint, we found minimal articular surface tearing of the rotator cuff, which was very minimal.  There was no synovitis within the glenohumeral joint.  The subscapularis was intact.  The labrum was intact as well  as the cartilage on the glenoid face and the humeral head.  The biceps tendon was also intact.  I did make an anterior portal through the rotator interval and used a soft tissue ablation wand to perform minimal debridement of some fraying tissue at the  undersurface of the rotator cuff.  I then entered the subacromial space through the posterior portal and made a separate lateral portal.  We did find significant bursitis and tendinosis of the rotator cuff, but no tearing on the bursal surface side.   Using a soft tissue ablation wand, we carried out debridement in this  area.  We also did this with the arthroscopic shaver.  Finally, with a high-speed bur, we performed partial acromioplasty.  This gave a good space in the subacromial outlet.  We then  removed all instrumentation and closed the portal sites with interrupted nylon suture.  Xeroform well-padded sterile  dressing was applied, and the shoulder was placed in a sling.  She was awakened, extubated, and taken to recovery room in stable  condition with all final counts being correct.  No complications noted.  Postoperatively, we will give her instructions for wound care and when to use her shoulder.  We will see her back in the office in a week.   ROH D: 08/13/2020 11:26:00 am T: 08/13/2020 12:57:00 pm  JOB: [id number]/ [id number]

## 2020-08-13 NOTE — Interval H&P Note (Signed)
History and Physical Interval Note: Patient is fully aware that she is here today for a right shoulder arthroscopy.  I have explained in detail what the surgery involves.  We had a discussion of the risks and benefits of surgery.  There has been no acute or interval change in her medical status.  See recent H&P.  The right shoulder has been marked.   08/13/2020 9:55 AM  Valerie Lee  has presented today for surgery, with the diagnosis of severe right shoulder impingement.  The various methods of treatment have been discussed with the patient and family. After consideration of risks, benefits and other options for treatment, the patient has consented to  Procedure(s): RIGHT SHOULDER ARTHROSCOPY WITH DEBRIDEMENT AND SUBACROMIAL DECOMPRESSION (Right) as a surgical intervention.  The patient's history has been reviewed, patient examined, no change in status, stable for surgery.  I have reviewed the patient's chart and labs.  Questions were answered to the patient's satisfaction.     Mcarthur Rossetti

## 2020-08-13 NOTE — Anesthesia Procedure Notes (Signed)
Procedure Name: LMA Insertion Date/Time: 08/13/2020 10:24 AM Performed by: Maryella Shivers, CRNA Pre-anesthesia Checklist: Patient identified, Emergency Drugs available, Suction available and Patient being monitored Patient Re-evaluated:Patient Re-evaluated prior to induction Oxygen Delivery Method: Circle system utilized Preoxygenation: Pre-oxygenation with 100% oxygen Induction Type: IV induction Ventilation: Mask ventilation without difficulty LMA: LMA inserted LMA Size: 4.0 Number of attempts: 1 Airway Equipment and Method: Bite block Placement Confirmation: positive ETCO2 Tube secured with: Tape Dental Injury: Teeth and Oropharynx as per pre-operative assessment

## 2020-08-13 NOTE — H&P (Signed)
Valerie Lee is an 45 y.o. female.   Chief Complaint:  Chronic right shoulder pain HPI:   Patient is a 45 year old female with debilitating right shoulder pain that is failed conservative treatment.  She has a diagnosis of right shoulder impingement syndrome and at least partial tearing of the rotator cuff that is minimal on MRI.  However, she continues to have significant pain with overhead activities and reaching behind her and has been developing a frozen shoulder due to her pain.  We have tried conservative treatment including steroid injections, activity modification, anti-inflammatories and physical therapy and this has not helped.  At this point we have recommended an arthroscopic intervention for her right shoulder.  Past Medical History:  Diagnosis Date  . Anemia   . Anxiety   . Asthma    rarely uses albuterol rescue  . Bipolar 1 disorder ([place])   . Depression   . Paranoia ([place])   . Schizophrenia ([place])   . Schizophrenia [facility])     Past Surgical History:  Procedure Laterality Date  . ABDOMINAL HYSTERECTOMY  05/26/2011   Procedure: HYSTERECTOMY ABDOMINAL;  Surgeon: Mora Bellman, MD;  Location: [place] [facility];  Service: Gynecology;  Laterality: N/A;  . CHOLECYSTECTOMY  2011  . TUBAL LIGATION    . TUBAL LIGATION      Family History  Problem Relation Age of Onset  . Hypertension Mother    Social History:  reports that she has quit smoking. Her smoking use included cigarettes. She has a 6.00 pack-year smoking history. She has never used smokeless tobacco. She reports that she does not drink alcohol and does not use drugs.  Allergies:  Allergies  Allergen Reactions  . Bactrim Anaphylaxis, Itching and Swelling  . Blue Dyes (Parenteral) Anaphylaxis and Shortness Of Breath  . Codeine Itching and Swelling    Hallucinations Pt can take vicodin with no reaction  . Penicillins Anaphylaxis, Itching and Swelling    Has patient had a PCN reaction causing immediate rash,  facial/tongue/throat swelling, SOB or lightheadedness with hypotension: Yes Has patient had a PCN reaction causing severe rash involving mucus membranes or skin necrosis: Yes Has patient had a PCN reaction that required hospitalization: Yes Has patient had a PCN reaction occurring within the last 10 years: No If all of the above answers are "NO", then may proceed with Cephalosporin use.   . Aspirin Swelling  . Ibuprofen Other (See Comments)    Lowers hemoglobin - pt has G6PD deficiency    Medications Prior to Admission  Medication Sig Dispense Refill  . ARIPiprazole (ABILIFY) 10 MG tablet Take 1 tablet (10 mg total) by mouth at bedtime. For mood stabilization 30 tablet 0  . hydrOXYzine (ATARAX/VISTARIL) 25 MG tablet Take 25 mg by mouth at bedtime as needed (sleep).    . lurasidone (LATUDA) 40 MG TABS tablet Take 40 mg by mouth daily with breakfast.    . methocarbamol (ROBAXIN) 500 MG tablet Take 1 tablet (500 mg total) by mouth 2 (two) times daily. 30 tablet 0  . doxycycline (VIBRAMYCIN) 100 MG capsule Take 1 capsule (100 mg total) by mouth 2 (two) times daily. One po bid x 7 days 14 capsule 0  . lamoTRIgine (LAMICTAL) 25 MG tablet Take 1 tablet (25 mg total) by mouth daily. For mood stabilization 30 tablet 0  . lidocaine (XYLOCAINE) 2 % solution Use as directed 15 mLs in the mouth or throat as needed for mouth pain. 100 mL 0  . tiZANidine (ZANAFLEX) 4 MG tablet  Take 1 tablet (4 mg total) by mouth every 8 (eight) hours as needed for muscle spasms. 30 tablet 0  . traMADol (ULTRAM) 50 MG tablet Take 1 tablet (50 mg total) by mouth every 6 (six) hours as needed. (Patient taking differently: Take 50 mg by mouth every 6 (six) hours as needed for moderate pain.) 15 tablet 0  . traZODone (DESYREL) 50 MG tablet Take 1 tablet (50 mg total) by mouth at bedtime as needed for sleep. 30 tablet 0    No results found for this or any previous visit (from the past 48 hour(s)). No results found.  Review of  Systems  All other systems reviewed and are negative.   Blood pressure 120/67, pulse (!) 104, temperature (!) 97 F (36.1 C), temperature source Oral, resp. rate 18, height 5' (1.524 m), weight 82.6 kg, last menstrual period 04/12/2011, SpO2 97 %. Physical Exam Vitals reviewed.  Constitutional:      Appearance: Normal appearance.  HENT:     Head: Normocephalic and atraumatic.  Eyes:     Extraocular Movements: Extraocular movements intact.     Pupils: Pupils are equal, round, and reactive to light.  Cardiovascular:     Rate and Rhythm: Normal rate and regular rhythm.     Pulses: Normal pulses.  Pulmonary:     Effort: Pulmonary effort is normal.     Breath sounds: Normal breath sounds.  Abdominal:     Palpations: Abdomen is soft.  Musculoskeletal:     Right shoulder: Tenderness present. Decreased range of motion. Decreased strength.     Cervical back: Normal range of motion.  Neurological:     Mental Status: She is alert and oriented to person, place, and time.  Psychiatric:        Behavior: Behavior normal.      Assessment/Plan Right shoulder severe impingement syndrome  The plan will be to take the patient to surgery today for a right shoulder arthroscopy with subacromial decompression and debridement.  We will address any rotator cuff tear is indicated based on intraoperative findings.  The risks and benefits of surgery have been explained in detail.  Mcarthur Rossetti, MD 08/13/2020, 9:13 AM

## 2020-08-13 NOTE — Anesthesia Preprocedure Evaluation (Addendum)
Anesthesia Evaluation  Patient identified by MRN, date of birth, ID band Patient awake    Reviewed: Allergy & Precautions, H&P , NPO status , Patient's Chart, lab work & pertinent test results, reviewed documented beta blocker date and time   History of Anesthesia Complications Negative for: history of anesthetic complications  Airway Mallampati: I  TM Distance: >3 FB Neck ROM: full    Dental no notable dental hx.  Reports many broken or chipped teeth:   Pulmonary asthma (last inhaler use last week, uses once every few months) , Smoking history: 3 cigs/day., former smoker,    Pulmonary exam normal breath sounds clear to auscultation       Cardiovascular Exercise Tolerance: Good negative cardio ROS Normal cardiovascular exam Rhythm:regular Rate:Normal     Neuro/Psych PSYCHIATRIC DISORDERS (schizophrenia, ) Bipolar Disorder Schizophrenia negative neurological ROS     GI/Hepatic negative GI ROS, Neg liver ROS,   Endo/Other  negative endocrine ROS  Renal/GU negative Renal ROS  negative genitourinary   Musculoskeletal   Abdominal   Peds  Hematology   Anesthesia Other Findings G6PD  Reproductive/Obstetrics negative OB ROS                            Anesthesia Physical  Anesthesia Plan  ASA: II  Anesthesia Plan: General   Post-op Pain Management:  Regional for Post-op pain   Induction: Intravenous  PONV Risk Score and Plan: 3 and Ondansetron, Dexamethasone, Midazolam and Treatment may vary due to age or medical condition  Airway Management Planned: LMA  Additional Equipment:   Intra-op Plan:   Post-operative Plan: Extubation in OR  Informed Consent: I have reviewed the patients History and Physical, chart, labs and discussed the procedure including the risks, benefits and alternatives for the proposed anesthesia with the patient or authorized representative who has indicated his/her  understanding and acceptance.     Dental Advisory Given  Plan Discussed with: CRNA and Surgeon  Anesthesia Plan Comments:         Anesthesia Quick Evaluation

## 2020-08-17 ENCOUNTER — Encounter (HOSPITAL_BASED_OUTPATIENT_CLINIC_OR_DEPARTMENT_OTHER): Payer: Self-pay | Admitting: Orthopaedic Surgery

## 2020-08-20 ENCOUNTER — Ambulatory Visit (INDEPENDENT_AMBULATORY_CARE_PROVIDER_SITE_OTHER): Payer: Self-pay | Admitting: Physician Assistant

## 2020-08-20 ENCOUNTER — Telehealth: Payer: Self-pay | Admitting: Orthopaedic Surgery

## 2020-08-20 ENCOUNTER — Other Ambulatory Visit: Payer: Self-pay

## 2020-08-20 DIAGNOSIS — Z9889 Other specified postprocedural states: Secondary | ICD-10-CM

## 2020-08-20 NOTE — Progress Notes (Signed)
HPI: Valerie Lee returns today status post right shoulder arthroscopy with extensive debridement, dated 08/13/2020.  She states that she has some pain in the shoulder.  She is taking hydrocodone.  She is wearing sling.  She does not feel that she can go back to work as of yet due to the shoulder pain and decreased range of motion.  Physical exam: Right shoulder port sites are well approximated with nylon sutures no signs of infection.  She has limited range of motion due to pain.  Full range of motion the elbow wrist and hand.  Impression: Status post right shoulder arthroscopy with extensive debridement  Plan: Shoulder arthroscopy images are reviewed with the patient.  Questions encouraged and answered.  Sutures were removed from port sites.  She shown shoulder exercises that she can start on her own.  We will refer her to outpatient physical therapy.  They will work on range of motion strengthening shoulder.  We will see her back in 4 weeks.  She will return to work on 08/31/2020 with the following restrictions with no lifting greater than 5 pounds.  We will reevaluate her work status in 4 weeks at follow-up.

## 2020-08-20 NOTE — Telephone Encounter (Signed)
Received $25.00 cash,medical records release form and disability paperwork from patient/Forwarding to [employer] today  

## 2020-08-21 NOTE — Addendum Note (Signed)
Addended by: Robyne Peers on: 08/21/2020 08:37 AM   Modules accepted: Orders

## 2020-08-27 ENCOUNTER — Ambulatory Visit: Payer: Self-pay | Admitting: Rehabilitative and Restorative Service Providers"

## 2020-08-28 ENCOUNTER — Telehealth: Payer: Self-pay | Admitting: Orthopaedic Surgery

## 2020-08-28 NOTE — Telephone Encounter (Signed)
Patient called requesting a revise return to work note. Pt states her employers is asking for her to return to work after 6/12 so she can be able to lift arm fuller. Please call patient when letter is ready for pick up. Patient phone number is [phone].

## 2020-08-28 NOTE — Telephone Encounter (Signed)
Patient aware note at front desk  

## 2020-08-28 NOTE — Telephone Encounter (Signed)
Pt called back asking for the note to state no lifting over 5lbs or overhead lifting if possible so her employer will have written notice of her limitations. Th best call back number is [phone].

## 2020-08-28 NOTE — Telephone Encounter (Signed)
That will be fine for her having that note.

## 2020-09-01 ENCOUNTER — Telehealth: Payer: Self-pay | Admitting: Orthopaedic Surgery

## 2020-09-01 NOTE — Telephone Encounter (Signed)
Pt called and states her note was wrong. That it was dated for february and not June. She wanted to go back to work on 09/07/2020 and she needs it to have her next appt in there too which is 09/17/2020. She would like a call whenever its ready please. [phone].

## 2020-09-01 NOTE — Telephone Encounter (Signed)
Letter completed and placed at front desk. Pt was called and informed

## 2020-09-02 ENCOUNTER — Ambulatory Visit: Payer: Self-pay | Attending: Physician Assistant

## 2020-09-05 ENCOUNTER — Ambulatory Visit: Payer: Self-pay | Admitting: Rehabilitative and Restorative Service Providers"

## 2020-09-07 ENCOUNTER — Ambulatory Visit: Payer: Self-pay

## 2020-09-09 ENCOUNTER — Ambulatory Visit: Payer: Self-pay

## 2020-09-17 ENCOUNTER — Ambulatory Visit (INDEPENDENT_AMBULATORY_CARE_PROVIDER_SITE_OTHER): Payer: Self-pay | Admitting: Physician Assistant

## 2020-09-17 DIAGNOSIS — M25511 Pain in right shoulder: Secondary | ICD-10-CM

## 2020-09-17 MED ORDER — METHYLPREDNISOLONE ACETATE 40 MG/ML IJ SUSP
40.0000 mg | INTRAMUSCULAR | Status: AC | PRN
  Administered 2020-09-17: 40 mg via INTRA_ARTICULAR

## 2020-09-17 MED ORDER — LIDOCAINE HCL 1 % IJ SOLN
3.0000 mL | INTRAMUSCULAR | Status: AC | PRN
  Administered 2020-09-17: 3 mL

## 2020-09-17 NOTE — Progress Notes (Signed)
HPI: Valerie Lee returns today 7 weeks status post right shoulder arthroscopy with debridement.  She continues to have pain in the shoulder.  She has difficulty reaching overhead.  She has been going to physical therapy which she went to for a week and now she cannot afford to go anymore and is doing home exercise program on her own as taught by therapy.  She still having pain in the right shoulder whenever she lies on it at night.  She notes that the shoulder limits what she can do at work and she still has restrictions of no overhead work and no lifting greater than 5 pounds.  Physical exam: Right shoulder active passive forward flexion to 180 degrees.  Positive impingement right shoulder.  Decreased internal rotation right shoulder.  Impression: Status post right shoulder arthroscopy with debridement 7 weeks postop  Plan: At this point time recommend subacromial injection.  Patient is amendable to this.  She will continue her home exercise program.  We will see her back in 4 weeks.  We will continue with her work restrictions and reevaluate these in 4 weeks      Procedure Note  Patient: Valerie Lee             Date of Birth: Aug 05, 1975           MRN: [id number]             Visit Date: 09/17/2020  Procedures: Visit Diagnoses: No diagnosis found.  Large Joint Inj: R subacromial bursa on 09/17/2020 5:35 PM Indications: pain Details: 22 G 1.5 in needle, superior approach  Arthrogram: No  Medications: 3 mL lidocaine 1 %; 40 mg methylPREDNISolone acetate 40 MG/ML Outcome: tolerated well, no immediate complications Procedure, treatment alternatives, risks and benefits explained, specific risks discussed. Consent was given by the patient. Immediately prior to procedure a time out was called to verify the correct patient, procedure, equipment, support staff and site/side marked as required. Patient was prepped and draped in the usual sterile fashion.

## 2020-10-15 ENCOUNTER — Encounter: Payer: Self-pay | Admitting: Physician Assistant

## 2020-11-20 ENCOUNTER — Emergency Department (HOSPITAL_COMMUNITY)
Admission: EM | Admit: 2020-11-20 | Discharge: 2020-11-20 | Disposition: A | Discharge status: Home / Self Care | Payer: Self-pay | Attending: Emergency Medicine | Admitting: Emergency Medicine

## 2020-11-20 ENCOUNTER — Other Ambulatory Visit: Payer: Self-pay

## 2020-11-20 ENCOUNTER — Encounter (HOSPITAL_COMMUNITY): Payer: Self-pay

## 2020-11-20 DIAGNOSIS — Z87891 Personal history of nicotine dependence: Secondary | ICD-10-CM | POA: Insufficient documentation

## 2020-11-20 DIAGNOSIS — J069 Acute upper respiratory infection, unspecified: Secondary | ICD-10-CM

## 2020-11-20 DIAGNOSIS — U071 COVID-19: Secondary | ICD-10-CM | POA: Insufficient documentation

## 2020-11-20 DIAGNOSIS — J45909 Unspecified asthma, uncomplicated: Secondary | ICD-10-CM | POA: Insufficient documentation

## 2020-11-20 LAB — SARS CORONAVIRUS 2 (TAT 6-24 HRS): SARS Coronavirus 2: POSITIVE — AB

## 2020-11-20 NOTE — ED Provider Notes (Signed)
[facility] DEPT Provider Note   CSN: [id number] Arrival date & time: 11/20/20  1009     History Chief Complaint  Patient presents with   Cough    Valerie Lee is a 45 y.o. female.  45 year old female who presents requesting COVID test.  Patient states she had a home test that was positive.  States her job requires her to have an official test.  She has had mild URI symptoms.  Denies any vomiting or diarrhea.  She has not been short of breath.      Past Medical History:  Diagnosis Date   Anemia    Anxiety    Asthma    rarely uses albuterol rescue   Bipolar 1 disorder ([place])    Depression    Paranoia ([place])    Schizophrenia ([place])    Schizophrenia ([place])     Patient Active Problem List   Diagnosis Date Noted   Impingement syndrome of right shoulder 08/13/2020   Marijuana abuse 10/05/2016   Paranoid ideation ([place]) 10/04/2016   Hyperprolactinemia ([place]) 11/28/2014   Schizoaffective disorder, mixed type ([place]) 11/24/2014   Fibroid uterus 05/12/2011   Pelvic pain 05/12/2011    Past Surgical History:  Procedure Laterality Date   ABDOMINAL HYSTERECTOMY  05/26/2011   Procedure: HYSTERECTOMY ABDOMINAL;  Surgeon: Mora Bellman, MD;  Location: [facility];  Service: Gynecology;  Laterality: N/A;   CHOLECYSTECTOMY  2011   SHOULDER ARTHROSCOPY Right 08/13/2020   Procedure: RIGHT SHOULDER ARTHROSCOPY WITH DEBRIDEMENT AND SUBACROMIAL DECOMPRESSION;  Surgeon: Mcarthur Rossetti, MD;  Location: [facility];  Service: Orthopedics;  Laterality: Right;   TUBAL LIGATION     TUBAL LIGATION       OB History     Gravida  6   Para  6   Term  5   Preterm  1   AB  0   Living  6      SAB  0   IAB  0   Ectopic  0   Multiple  0   Live Births              Family History  Problem Relation Age of Onset   Hypertension Mother     Social History   Tobacco Use   Smoking status: Former    Packs/day: 0.25    Years:  24.00    Pack years: 6.00    Types: Cigarettes   Smokeless tobacco: Never  Vaping Use   Vaping Use: Never used  Substance Use Topics   Alcohol use: No   Drug use: No    Home Medications Prior to Admission medications   Medication Sig Start Date End Date Taking? Authorizing Provider  ARIPiprazole (ABILIFY) 10 MG tablet Take 1 tablet (10 mg total) by mouth at bedtime. For mood stabilization 10/07/16   Money, Darnelle Maffucci B, FNP  hydrOXYzine (ATARAX/VISTARIL) 25 MG tablet Take 25 mg by mouth at bedtime as needed (sleep).    [provider]  lamoTRIgine (LAMICTAL) 25 MG tablet Take 1 tablet (25 mg total) by mouth daily. For mood stabilization 10/08/16   Money, Darnelle Maffucci B, FNP  lidocaine (XYLOCAINE) 2 % solution Use as directed 15 mLs in the mouth or throat as needed for mouth pain. 12/06/19   Khatri, Hina, PA-C  lurasidone (LATUDA) 40 MG TABS tablet Take 40 mg by mouth daily with breakfast.    [provider]  methocarbamol (ROBAXIN) 500 MG tablet Take 1 tablet (500 mg total) by  mouth 2 (two) times daily. Patient not taking: Reported on 09/17/2020 04/22/20   Pete Pelt, PA-C  ondansetron (ZOFRAN ODT) 4 MG disintegrating tablet Take 1 tablet (4 mg total) by mouth every 8 (eight) hours as needed for nausea or vomiting. Patient not taking: Reported on 09/17/2020 08/13/20   Mcarthur Rossetti, MD  oxyCODONE (OXY IR/ROXICODONE) 5 MG immediate release tablet Take 1-2 tablets (5-10 mg total) by mouth every 6 (six) hours as needed (for pain score of 1-4). Patient not taking: Reported on 09/17/2020 08/13/20   Mcarthur Rossetti, MD  tiZANidine (ZANAFLEX) 4 MG tablet Take 1 tablet (4 mg total) by mouth every 8 (eight) hours as needed for muscle spasms. Patient not taking: Reported on 09/17/2020 04/17/20   Mcarthur Rossetti, MD  traZODone (DESYREL) 50 MG tablet Take 1 tablet (50 mg total) by mouth at bedtime as needed for sleep. 10/07/16   Money, Lowry Ram, FNP    Allergies     Bactrim, Blue dyes (parenteral), Codeine, Penicillins, Aspirin, and Ibuprofen  Review of Systems   Review of Systems  All other systems reviewed and are negative.  Physical Exam Updated Vital Signs BP (!) 145/108 (BP Location: Left Arm)   Pulse 87   Temp 98.8 F (37.1 C) (Oral)   Resp 16   LMP 04/12/2011 Comment: neg hcg on 07/25/2019  SpO2 98%   Physical Exam Vitals and nursing note reviewed.  Constitutional:      General: She is not in acute distress.    Appearance: Normal appearance. She is well-developed. She is not toxic-appearing.  HENT:     Head: Normocephalic and atraumatic.  Eyes:     General: Lids are normal.     Conjunctiva/sclera: Conjunctivae normal.     Pupils: Pupils are equal, round, and reactive to light.  Neck:     Thyroid: No thyroid mass.     Trachea: No tracheal deviation.  Cardiovascular:     Rate and Rhythm: Normal rate and regular rhythm.     Heart sounds: Normal heart sounds. No murmur heard.   No gallop.  Pulmonary:     Effort: Pulmonary effort is normal. No respiratory distress.     Breath sounds: Normal breath sounds. No stridor. No decreased breath sounds, wheezing, rhonchi or rales.  Abdominal:     General: There is no distension.     Palpations: Abdomen is soft.     Tenderness: There is no abdominal tenderness. There is no rebound.  Musculoskeletal:        General: No tenderness. Normal range of motion.     Cervical back: Normal range of motion and neck supple.  Skin:    General: Skin is warm and dry.     Findings: No abrasion or rash.  Neurological:     Mental Status: She is alert and oriented to person, place, and time. Mental status is at baseline.     GCS: GCS eye subscore is 4. GCS verbal subscore is 5. GCS motor subscore is 6.     Cranial Nerves: Cranial nerves are intact. No cranial nerve deficit.     Sensory: No sensory deficit.     Motor: Motor function is intact.  Psychiatric:        Attention and Perception: Attention  normal.        Speech: Speech normal.        Behavior: Behavior normal.    ED Results / Procedures / Treatments   Labs (all labs ordered are  listed, but only abnormal results are displayed) Labs Reviewed  SARS CORONAVIRUS 2 (TAT 6-24 HRS)    EKG None  Radiology No results found.  Procedures Procedures   Medications Ordered in ED Medications - No data to display  ED Course  I have reviewed the triage vital signs and the nursing notes.  Pertinent labs & imaging results that were available during my care of the patient were reviewed by me and considered in my medical decision making (see chart for details).    MDM Rules/Calculators/A&P                           Patient have COVID test here.  Does not qualify for Paxlovid Final Clinical Impression(s) / ED Diagnoses Final diagnoses:  None    Rx / DC Orders ED Discharge Orders     None        Lacretia Leigh, MD 11/20/20 1110

## 2020-11-20 NOTE — Discharge Instructions (Addendum)
Please follow-up in my chart for your COVID results

## 2020-11-20 NOTE — ED Triage Notes (Signed)
Pt reports cough, headache, and congestion since Monday. Pt reports home test was positive and she came here to make sure she is positive.

## 2021-09-12 ENCOUNTER — Ambulatory Visit (HOSPITAL_COMMUNITY)
Admission: EM | Admit: 2021-09-12 | Discharge: 2021-09-12 | Disposition: A | Discharge status: Home / Self Care | Payer: Self-pay | Attending: Family Medicine | Admitting: Family Medicine

## 2021-09-12 ENCOUNTER — Encounter (HOSPITAL_COMMUNITY): Payer: Self-pay

## 2021-09-12 DIAGNOSIS — M542 Cervicalgia: Secondary | ICD-10-CM

## 2021-09-12 MED ORDER — TIZANIDINE HCL 4 MG PO TABS: 4.0000 mg | ORAL_TABLET | Freq: Three times a day (TID) | ORAL | 0 refills | Status: DC | PRN

## 2021-09-12 MED ORDER — KETOROLAC TROMETHAMINE 30 MG/ML IJ SOLN
30.0000 mg | Freq: Once | INTRAMUSCULAR | Status: AC
  Administered 2021-09-12: 30 mg via INTRAMUSCULAR

## 2021-09-12 MED ORDER — NAPROXEN 500 MG PO TABS: 500.0000 mg | ORAL_TABLET | Freq: Two times a day (BID) | ORAL | 0 refills | Status: DC

## 2021-09-12 MED ORDER — KETOROLAC TROMETHAMINE 30 MG/ML IJ SOLN
INTRAMUSCULAR | Status: AC
  Filled 2021-09-12: qty 1
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
## 2021-09-12 NOTE — Discharge Instructions (Addendum)
You have been given a shot of Toradol 30 mg today.   Naproxen 500 mg-- 1 tablet 2 times daily as needed for pain  Tizanidine 4 mg--1 tablet every 8 hours as needed for muscle spasms or muscle pain  Heating pad can help you feel

## 2021-09-14 ENCOUNTER — Emergency Department (HOSPITAL_COMMUNITY): Payer: No Typology Code available for payment source

## 2021-09-14 ENCOUNTER — Encounter (HOSPITAL_COMMUNITY): Payer: Self-pay

## 2021-09-14 ENCOUNTER — Emergency Department (HOSPITAL_COMMUNITY)
Admission: EM | Admit: 2021-09-14 | Discharge: 2021-09-14 | Disposition: A | Discharge status: Home / Self Care | Payer: No Typology Code available for payment source | Attending: Emergency Medicine | Admitting: Emergency Medicine

## 2021-09-14 ENCOUNTER — Encounter (HOSPITAL_COMMUNITY): Payer: Self-pay | Admitting: Emergency Medicine

## 2021-09-14 ENCOUNTER — Other Ambulatory Visit: Payer: Self-pay

## 2021-09-14 ENCOUNTER — Ambulatory Visit (HOSPITAL_COMMUNITY)
Admission: EM | Admit: 2021-09-14 | Discharge: 2021-09-14 | Disposition: A | Discharge status: Home / Self Care | Payer: Self-pay

## 2021-09-14 DIAGNOSIS — H538 Other visual disturbances: Secondary | ICD-10-CM

## 2021-09-14 DIAGNOSIS — R42 Dizziness and giddiness: Secondary | ICD-10-CM

## 2021-09-14 DIAGNOSIS — S060X0A Concussion without loss of consciousness, initial encounter: Secondary | ICD-10-CM | POA: Diagnosis not present

## 2021-09-14 DIAGNOSIS — Y92410 Unspecified street and highway as the place of occurrence of the external cause: Secondary | ICD-10-CM | POA: Insufficient documentation

## 2021-09-14 DIAGNOSIS — R519 Headache, unspecified: Secondary | ICD-10-CM

## 2021-09-14 DIAGNOSIS — S0990XA Unspecified injury of head, initial encounter: Secondary | ICD-10-CM | POA: Diagnosis present

## 2021-09-14 LAB — URINALYSIS, ROUTINE W REFLEX MICROSCOPIC
Bilirubin Urine: NEGATIVE
Glucose, UA: NEGATIVE mg/dL
Ketones, ur: NEGATIVE mg/dL
Leukocytes,Ua: NEGATIVE
Nitrite: NEGATIVE
Protein, ur: NEGATIVE mg/dL
Specific Gravity, Urine: 1.023 (ref 1.005–1.030)
pH: 5 (ref 5.0–8.0)

## 2021-09-14 LAB — CBC WITH DIFFERENTIAL/PLATELET
Abs Immature Granulocytes: 0.04 10*3/uL (ref 0.00–0.07)
Basophils Absolute: 0.1 10*3/uL (ref 0.0–0.1)
Basophils Relative: 1 %
Eosinophils Absolute: 0.3 10*3/uL (ref 0.0–0.5)
Eosinophils Relative: 2 %
HCT: 36.9 % (ref 36.0–46.0)
Hemoglobin: 12.5 g/dL (ref 12.0–15.0)
Immature Granulocytes: 0 %
Lymphocytes Relative: 30 %
Lymphs Abs: 3.9 10*3/uL (ref 0.7–4.0)
MCH: 33 pg (ref 26.0–34.0)
MCHC: 33.9 g/dL (ref 30.0–36.0)
MCV: 97.4 fL (ref 80.0–100.0)
Monocytes Absolute: 1 10*3/uL (ref 0.1–1.0)
Monocytes Relative: 8 %
Neutro Abs: 7.7 10*3/uL (ref 1.7–7.7)
Neutrophils Relative %: 59 %
Platelets: 284 10*3/uL (ref 150–400)
RBC: 3.79 MIL/uL — ABNORMAL LOW (ref 3.87–5.11)
RDW: 12.7 % (ref 11.5–15.5)
WBC: 12.9 10*3/uL — ABNORMAL HIGH (ref 4.0–10.5)
nRBC: 0 % (ref 0.0–0.2)

## 2021-09-14 LAB — BASIC METABOLIC PANEL
Anion gap: 7 (ref 5–15)
BUN: 9 mg/dL (ref 6–20)
CO2: 21 mmol/L — ABNORMAL LOW (ref 22–32)
Calcium: 8.9 mg/dL (ref 8.9–10.3)
Chloride: 110 mmol/L (ref 98–111)
Creatinine, Ser: 0.84 mg/dL (ref 0.44–1.00)
GFR, Estimated: >60 mL/min (ref >60)
Glucose, Bld: 92 mg/dL (ref 70–99)
Potassium: 3.5 mmol/L (ref 3.5–5.1)
Sodium: 138 mmol/L (ref 135–145)

## 2021-09-14 MED ORDER — ONDANSETRON 4 MG PO TBDP
4.0000 mg | ORAL_TABLET | Freq: Once | ORAL | Status: AC
  Administered 2021-09-14: 4 mg via ORAL
  Filled 2021-09-14: qty 1

## 2021-09-14 MED ORDER — METOCLOPRAMIDE HCL 10 MG PO TABS: 10.0000 mg | ORAL_TABLET | Freq: Four times a day (QID) | ORAL | 0 refills | Status: DC | PRN

## 2021-09-14 MED ORDER — METOCLOPRAMIDE HCL 10 MG PO TABS
10.0000 mg | ORAL_TABLET | Freq: Once | ORAL | Status: AC
  Administered 2021-09-14: 10 mg via ORAL
  Filled 2021-09-14: qty 1

## 2021-09-14 NOTE — Discharge Instructions (Addendum)
Your CT scan is normal right now but you have a concussion.  I recommend that you take Tylenol for headache and Reglan for severe headaches  Please rest for 2 days and expect headache and dizziness and blurry vision and photophobia  See your doctor for follow-up.  I also referred you to a neurologist   Return to ER if you have worse headaches, vomiting, weakness, trouble speaking
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
## 2021-09-14 NOTE — ED Triage Notes (Signed)
Patient was seen 6/18 at [facility].  Reports headache has worsened and vision is blurry.  Patient has complaints of light sensitivity.  On 1-10 scale, rates pain 15.  Patient reports this is the worst headache ever.
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
## 2021-09-14 NOTE — Discharge Instructions (Addendum)
Go straight to ED for further evaluation, do not eat or drink anything prior to evaluation by provider.
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
## 2021-10-18 ENCOUNTER — Encounter: Payer: Self-pay | Admitting: Psychiatry

## 2021-10-18 ENCOUNTER — Ambulatory Visit: Payer: No Typology Code available for payment source | Admitting: Psychiatry

## 2021-11-03 ENCOUNTER — Ambulatory Visit: Payer: Self-pay | Admitting: Psychiatry
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
## 2022-05-05 ENCOUNTER — Encounter (HOSPITAL_COMMUNITY): Payer: Self-pay

## 2022-05-05 ENCOUNTER — Ambulatory Visit (HOSPITAL_COMMUNITY)
Admission: EM | Admit: 2022-05-05 | Discharge: 2022-05-05 | Disposition: A | Discharge status: Home / Self Care | Payer: Self-pay | Attending: Internal Medicine | Admitting: Internal Medicine

## 2022-05-05 DIAGNOSIS — Z20822 Contact with and (suspected) exposure to covid-19: Secondary | ICD-10-CM

## 2022-05-05 DIAGNOSIS — J069 Acute upper respiratory infection, unspecified: Secondary | ICD-10-CM

## 2022-05-05 MED ORDER — BENZONATATE 100 MG PO CAPS
100.0000 mg | ORAL_CAPSULE | Freq: Three times a day (TID) | ORAL | 0 refills | Status: DC | PRN
Start: 2022-05-05 — End: 2023-08-17

## 2022-05-05 MED ORDER — ONDANSETRON 4 MG PO TBDP: 4.0000 mg | ORAL_TABLET | Freq: Three times a day (TID) | ORAL | 0 refills | Status: DC | PRN

## 2022-05-05 NOTE — ED Triage Notes (Signed)
Chief Complaint: states she tested positive for Covid at work. Her job wants her re-tested. Patient having fatigue, runny nose, cough, decreased appetite, body aches.   Onset: 2 days   Prescriptions or OTC medications tried: Yes- Nyquil, otc pain reliever     with mild relief  Sick exposure: Yes- COVID exposure at work   [employer], medications, or products: No  Recent Travel: No

## 2022-05-05 NOTE — Discharge Instructions (Addendum)
Please maintain adequate hydration Take medications as prescribed Will call you with recommendations if your labs are abnormal Please quarantine until COVID-19 test results are available.  Depending on the test results we will make further recommendations about how long he should stay out of work. Return to urgent care if symptoms worsen.

## 2022-05-05 NOTE — ED Provider Notes (Signed)
[place]     CSN: [id number] Arrival date & time: 05/05/22  1121      History   Chief Complaint Chief Complaint  Patient presents with   Covid Exposure   Generalized Body Aches   Cough    HPI Valerie Lee is a 47 y.o. female comes to urgent care for COVID testing.  Patient was exposed to a confirmed case of COVID-19 infection a few days ago.  Patient developed generalized fatigue.  Generalized body aches, cough, decreased appetite and a sore throat.  Patient's symptoms have persisted.  She denies any shortness of breath or wheezing.  No nausea, vomiting or diarrhea.  No loss of taste or smell.   HPI  Past Medical History:  Diagnosis Date   Anemia    Anxiety    Asthma    rarely uses albuterol rescue   Bipolar 1 disorder ([place])    Depression    Paranoia ([place])    Schizophrenia ([place])    Schizophrenia ([place])     Patient Active Problem List   Diagnosis Date Noted   Severe headache 09/14/2021   Blurry vision 09/14/2021   Dizziness 09/14/2021   Impingement syndrome of right shoulder 08/13/2020   Marijuana abuse 10/05/2016   Paranoid ideation ([place]) 10/04/2016   Hyperprolactinemia ([place]) 11/28/2014   Schizoaffective disorder, mixed type ([place]) 11/24/2014   Fibroid uterus 05/12/2011   Pelvic pain 05/12/2011    Past Surgical History:  Procedure Laterality Date   ABDOMINAL HYSTERECTOMY  05/26/2011   Procedure: HYSTERECTOMY ABDOMINAL;  Surgeon: Mora Bellman, MD;  Location: [facility];  Service: Gynecology;  Laterality: N/A;   CHOLECYSTECTOMY  2011   SHOULDER ARTHROSCOPY Right 08/13/2020   Procedure: RIGHT SHOULDER ARTHROSCOPY WITH DEBRIDEMENT AND SUBACROMIAL DECOMPRESSION;  Surgeon: Mcarthur Rossetti, MD;  Location: [facility];  Service: Orthopedics;  Laterality: Right;   TUBAL LIGATION     TUBAL LIGATION      OB History     Gravida  6   Para  6   Term  5   Preterm  1   AB  0   Living  6      SAB  0   IAB  0   Ectopic   0   Multiple  0   Live Births               Home Medications    Prior to Admission medications   Medication Sig Start Date End Date Taking? Authorizing Provider  ARIPiprazole (ABILIFY) 10 MG tablet Take 1 tablet (10 mg total) by mouth at bedtime. For mood stabilization 10/07/16  Yes Money, Lowry Ram, FNP  benzonatate (TESSALON) 100 MG capsule Take 1 capsule (100 mg total) by mouth 3 (three) times daily as needed for cough. 05/05/22  Yes Zaynab Chipman, Myrene Galas, MD  hydrOXYzine (ATARAX/VISTARIL) 25 MG tablet Take 25 mg by mouth at bedtime as needed (sleep).   Yes [provider]  lamoTRIgine (LAMICTAL) 25 MG tablet Take 1 tablet (25 mg total) by mouth daily. For mood stabilization 10/08/16  Yes Money, Lowry Ram, FNP  lidocaine (XYLOCAINE) 2 % solution Use as directed 15 mLs in the mouth or throat as needed for mouth pain. 12/06/19  Yes Khatri, Hina, PA-C  lurasidone (LATUDA) 40 MG TABS tablet Take 40 mg by mouth daily with breakfast.   Yes [provider]  metoCLOPramide (REGLAN) 10 MG tablet Take 1 tablet (10 mg total) by mouth every 6 (six) hours as needed for  nausea (nausea/headache). 09/14/21  Yes Drenda Freeze, MD  naproxen (NAPROSYN) 500 MG tablet Take 1 tablet (500 mg total) by mouth 2 (two) times daily. 09/12/21  Yes Barrett Henle, MD  ondansetron (ZOFRAN-ODT) 4 MG disintegrating tablet Take 1 tablet (4 mg total) by mouth every 8 (eight) hours as needed for nausea or vomiting. 05/05/22  Yes Roberth Berling, Myrene Galas, MD  tiZANidine (ZANAFLEX) 4 MG tablet Take 1 tablet (4 mg total) by mouth every 8 (eight) hours as needed for muscle spasms. 09/12/21  Yes Barrett Henle, MD  traZODone (DESYREL) 50 MG tablet Take 1 tablet (50 mg total) by mouth at bedtime as needed for sleep. 10/07/16  Yes Money, Lowry Ram, FNP    Family History Family History  Problem Relation Age of Onset   Hypertension Mother     Social History Social History   Tobacco Use   Smoking status: Every  Day    Packs/day: 0.25    Years: 24.00    Total pack years: 6.00    Types: Cigarettes   Smokeless tobacco: Never  Vaping Use   Vaping Use: Never used  Substance Use Topics   Alcohol use: No   Drug use: No     Allergies   Bactrim, Blue dyes (parenteral), Codeine, Penicillins, Aspirin, and Ibuprofen   Review of Systems Review of Systems As per HPI  Physical Exam Triage Vital Signs ED Triage Vitals  Enc Vitals Group     BP 05/05/22 1219 104/69     Pulse Rate 05/05/22 1219 74     Resp 05/05/22 1219 16     Temp 05/05/22 1219 98.3 F (36.8 C)     Temp Source 05/05/22 1219 Oral     SpO2 05/05/22 1219 98 %     Weight 05/05/22 1219 110 lb (49.9 kg)     Height 05/05/22 1219 5' 1.5" (1.562 m)     Head Circumference --      Peak Flow --      Pain Score 05/05/22 1218 10     Pain Loc --      Pain Edu? --      Excl. in [place]? --    No data found.  Updated Vital Signs BP 104/69 (BP Location: Right Arm)   Pulse 74   Temp 98.3 F (36.8 C) (Oral)   Resp 16   Ht 5' 1.5" (1.562 m)   Wt 49.9 kg   LMP 04/12/2011 Comment: neg hcg on 07/25/2019  SpO2 98%   BMI 20.45 kg/m   Visual Acuity Right Eye Distance:   Left Eye Distance:   Bilateral Distance:    Right Eye Near:   Left Eye Near:    Bilateral Near:     Physical Exam Vitals and nursing note reviewed.  Constitutional:      Appearance: She is ill-appearing.  HENT:     Right Ear: Tympanic membrane normal.     Left Ear: Tympanic membrane normal.  Cardiovascular:     Rate and Rhythm: Normal rate and regular rhythm.     Pulses: Normal pulses.     Heart sounds: Normal heart sounds.  Pulmonary:     Effort: Pulmonary effort is normal.     Breath sounds: Normal breath sounds.  Abdominal:     General: Bowel sounds are normal.     Palpations: Abdomen is soft.  Musculoskeletal:        General: Normal range of motion.  Neurological:     Mental Status:  She is alert.     UC Treatments / Results  Labs (all labs  ordered are listed, but only abnormal results are displayed) Labs Reviewed  SARS CORONAVIRUS 2 (TAT 6-24 HRS)    EKG   Radiology No results found.  Procedures Procedures (including critical care time)  Medications Ordered in UC Medications - No data to display  Initial Impression / Assessment and Plan / UC Course  I have reviewed the triage vital signs and the nursing notes.  Pertinent labs & imaging results that were available during my care of the patient were reviewed by me and considered in my medical decision making (see chart for details).     1.  Viral URI with cough: 2.  Close exposure to confirmed case of COVID-19: Increase oral fluid intake COVID-19 PCR test has been sent Zofran as needed for nausea/vomiting Tessalon Perles as needed for cough Return precautions given Patient will be called and updated if lab results are abnormal. Return precautions given.  Final Clinical Impressions(s) / UC Diagnoses   Final diagnoses:  Viral URI with cough  Close exposure to COVID-19 virus   Discharge Instructions   None    ED Prescriptions     Medication Sig Dispense Auth. Provider   ondansetron (ZOFRAN-ODT) 4 MG disintegrating tablet Take 1 tablet (4 mg total) by mouth every 8 (eight) hours as needed for nausea or vomiting. 20 tablet Gessica Jawad, Myrene Galas, MD   benzonatate (TESSALON) 100 MG capsule Take 1 capsule (100 mg total) by mouth 3 (three) times daily as needed for cough. 21 capsule Neale Marzette, Myrene Galas, MD      PDMP not reviewed this encounter.   Chase Picket, MD 05/05/22 [phone]

## 2022-05-06 ENCOUNTER — Telehealth (HOSPITAL_COMMUNITY): Payer: Self-pay | Admitting: Family Medicine

## 2022-05-06 LAB — SARS CORONAVIRUS 2 (TAT 6-24 HRS): SARS Coronavirus 2: POSITIVE — AB

## 2022-05-06 MED ORDER — NAPROXEN 500 MG PO TABS: 500.0000 mg | ORAL_TABLET | Freq: Two times a day (BID) | ORAL | 0 refills | Status: DC

## 2022-05-06 NOTE — Telephone Encounter (Signed)
Pt requested something for aches other than ibuprofen, which she says she cannot take. In June I saw her, and pt stated she could take naproxen. New rx sent today

## 2023-01-12 ENCOUNTER — Ambulatory Visit (HOSPITAL_COMMUNITY)
Admission: EM | Admit: 2023-01-12 | Discharge: 2023-01-12 | Disposition: A | Discharge status: Home / Self Care | Payer: Self-pay | Attending: Family Medicine | Admitting: Family Medicine

## 2023-01-12 ENCOUNTER — Ambulatory Visit (INDEPENDENT_AMBULATORY_CARE_PROVIDER_SITE_OTHER): Payer: Self-pay

## 2023-01-12 ENCOUNTER — Encounter (HOSPITAL_COMMUNITY): Payer: Self-pay | Admitting: Emergency Medicine

## 2023-01-12 DIAGNOSIS — G5792 Unspecified mononeuropathy of left lower limb: Secondary | ICD-10-CM

## 2023-01-12 MED ORDER — HYDROCODONE-ACETAMINOPHEN 5-325 MG PO TABS
1.0000 | ORAL_TABLET | Freq: Once | ORAL | Status: AC
  Administered 2023-01-12: 1 via ORAL

## 2023-01-12 MED ORDER — HYDROCODONE-ACETAMINOPHEN 5-325 MG PO TABS
ORAL_TABLET | ORAL | Status: AC
  Filled 2023-01-12: qty 1

## 2023-01-12 MED ORDER — GABAPENTIN 400 MG PO CAPS: 400.0000 mg | ORAL_CAPSULE | Freq: Two times a day (BID) | ORAL | 0 refills | Status: DC

## 2023-01-12 NOTE — ED Provider Notes (Signed)
[facility]    CSN: [id number] Arrival date & time: 01/12/23  1003      History   Chief Complaint Chief Complaint  Patient presents with   Foot Injury    HPI Valerie Lee is a 47 y.o. female.   Injury is a 47 year old female presenting with acute left foot pain after choir practice yesterday when she stomped very hard on her left foot and felt immediate pain with difficulty bearing weight.  She continued to work last night until she was sent home due to pain and swelling of the foot.  She has not been able to bear weight and has had throbbing, 10 out of 10, unchanged pain.  Her swelling has gone away and she has no numbness or weakness distally but she has not been able to bear weight today.  She did not drive and has transportation after this visit.  She denies any back pain or shooting down her leg.  She can flex and extend her knee without issues.  She does have a history of gabapentin used for her right shoulder prior to surgery.  The history is provided by the patient.  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   Schizoaffective disorder, mixed type (HCC) 11/24/2014   Fibroid uterus 05/12/2011   Pelvic pain 05/12/2011    Past Surgical History:  Procedure Laterality Date   ABDOMINAL HYSTERECTOMY  05/26/2011   Procedure: HYSTERECTOMY ABDOMINAL;  Surgeon: Catalina Antigua, MD;  Location: [facility];  Service: Gynecology;  Laterality: N/A;   CHOLECYSTECTOMY   2011   SHOULDER ARTHROSCOPY Right 08/13/2020   Procedure: RIGHT SHOULDER ARTHROSCOPY WITH DEBRIDEMENT AND SUBACROMIAL DECOMPRESSION;  Surgeon: Kathryne Hitch, MD;  Location: [facility];  Service: Orthopedics;  Laterality: Right;   TUBAL LIGATION     TUBAL LIGATION      OB History     Gravida  6   Para  6   Term  5   Preterm  1   AB  0   Living  6      SAB  0   IAB  0   Ectopic  0   Multiple  0   Live Births               Home Medications    Prior to Admission medications   Medication Sig Start Date End Date Taking? Authorizing Provider  gabapentin (NEURONTIN) 400 MG capsule Take 1 capsule (400 mg total) by mouth 2 (two) times daily for 14 days. 01/12/23 01/26/23 Yes Ivor Messier, MD  ARIPiprazole (ABILIFY) 10 MG tablet Take 1 tablet (10 mg total) by mouth at bedtime. For mood stabilization 10/07/16   Money, Gerlene Burdock, FNP  benzonatate (TESSALON) 100 MG capsule Take 1 capsule (100 mg total) by mouth 3 (three) times daily as needed for cough. Patient not taking: Reported on 01/12/2023 05/05/22  Merrilee Jansky, MD  hydrOXYzine (ATARAX/VISTARIL) 25 MG tablet Take 25 mg by mouth at bedtime as needed (sleep).    [provider]  lamoTRIgine (LAMICTAL) 25 MG tablet Take 1 tablet (25 mg total) by mouth daily. For mood stabilization Patient not taking: Reported on 01/12/2023 10/08/16   Money, Gerlene Burdock, FNP  lidocaine (XYLOCAINE) 2 % solution Use as directed 15 mLs in the mouth or throat as needed for mouth pain. 12/06/19   Khatri, Hina, PA-C  lurasidone (LATUDA) 40 MG TABS tablet Take 40 mg by mouth daily with breakfast.    [provider]  metoCLOPramide (REGLAN) 10 MG tablet Take 1 tablet (10 mg total) by mouth every 6 (six) hours as needed for nausea (nausea/headache). 09/14/21   Charlynne Pander, MD  naproxen (NAPROSYN) 500 MG tablet Take 1 tablet (500 mg total) by mouth 2 (two) times daily. 05/06/22   Zenia Resides,  MD  ondansetron (ZOFRAN-ODT) 4 MG disintegrating tablet Take 1 tablet (4 mg total) by mouth every 8 (eight) hours as needed for nausea or vomiting. 05/05/22   Lamptey, Britta Mccreedy, MD  tiZANidine (ZANAFLEX) 4 MG tablet Take 1 tablet (4 mg total) by mouth every 8 (eight) hours as needed for muscle spasms. 09/12/21   Zenia Resides, MD  traZODone (DESYREL) 50 MG tablet Take 1 tablet (50 mg total) by mouth at bedtime as needed for sleep. 10/07/16   Money, Gerlene Burdock, FNP    Family History Family History  Problem Relation Age of Onset   Hypertension Mother     Social History Social History   Tobacco Use   Smoking status: Every Day    Current packs/day: 0.25    Average packs/day: 0.3 packs/day for 24.0 years (6.0 ttl pk-yrs)    Types: Cigarettes   Smokeless tobacco: Never  Vaping Use   Vaping status: Never Used  Substance Use Topics   Alcohol use: No   Drug use: No     Allergies   Bactrim, Blue dyes (parenteral), Codeine, Penicillins, Aspirin, and Ibuprofen   Review of Systems Review of Systems  Constitutional:  Negative for chills and fever.  Musculoskeletal:  Positive for arthralgias.       Pain over the left lateral foot and plantar surface as well as the top of the foot.  Decreased range of motion due to pain.  No numbness or weakness of the toes.  Ankle with full range of motion but causes foot pain.  Shooting pain from the knee down the producible when pressing on the outside of the leg.  Skin:  Positive for color change (Bruising).  Neurological:  Positive for numbness (Intermittent last night but not worsening and not consistent.). Negative for weakness.     Physical Exam Triage Vital Signs ED Triage Vitals  Encounter Vitals Group     BP 01/12/23 1118 128/66     Systolic BP Percentile --      Diastolic BP Percentile --      Pulse Rate 01/12/23 1118 73     Resp 01/12/23 1118 16     Temp 01/12/23 1118 98 F (36.7 C)     Temp Source 01/12/23 1118 Oral     SpO2  01/12/23 1118 97 %     Weight --      Height --      Head Circumference --      Peak Flow --      Pain Score 01/12/23 1115 10  Pain Loc --      Pain Education --      Exclude from Growth Chart --    No data found.  Updated Vital Signs BP 128/66 (BP Location: Left Arm)   Pulse 73   Temp 98 F (36.7 C) (Oral)   Resp 16   LMP 04/12/2011 Comment: neg hcg on 07/25/2019  SpO2 97%   Visual Acuity Right Eye Distance:   Left Eye Distance:   Bilateral Distance:    Right Eye Near:   Left Eye Near:    Bilateral Near:     Physical Exam Vitals reviewed.  Constitutional:      Appearance: Normal appearance. She is normal weight.  Musculoskeletal:     Comments: Left foot:  Observation: No edema, trace ecchymosis over the left lateral foot superior to the fifth metatarsal, no erythema ROM of passive IR and ER of forefoot: Unable to assess due to pain ROM Phalanges: Full Long arch: preserved Transverse arch: preserved Talar dome nontender, Navicular NT, Cuboid: NT Calcaneous: NT Achilles: NT Peroneals: NT Post Tib: NT Metatarsals: no brachymetatarsia present, tenderness palpation of the 2-4 MTs 5th MT: Significant tenderness to minimal palpation over the proximal head and plantar surface Dorsalis Pedis pulse: 2+, and equal bilat Sensation: intact.  After further evaluation the patient having hyperalgesia of the entire common fibular distribution to very light touch.   Palpation over the left fibular head reproduces pain locally and shooting down her leg. Palpation of the gastrocnemius between the heads also reproduces pain shooting down the posterior aspect over the leg into her foot No tenderness to palpation over the anterior compartment Left knee extension full to 0 degrees, flexion full to 130 degrees but reproduces radicular pain at peak flexion   Skin:    General: Skin is warm.     Capillary Refill: Capillary refill takes less than 2 seconds.     Comments: Trace  ecchymosis over the left lateral midfoot  Neurological:     General: No focal deficit present.     Mental Status: She is alert and oriented to person, place, and time.     Motor: No weakness.      UC Treatments / Results  Labs (all labs ordered are listed, but only abnormal results are displayed) Labs Reviewed - No data to display  EKG   Radiology DG Tibia/Fibula Left  Result Date: 01/12/2023 CLINICAL DATA:  Foot injury Saturday. Pain and tenderness over the fibular head. EXAM: LEFT TIBIA AND FIBULA - 2 VIEW COMPARISON:  None available FINDINGS: No acute fracture or dislocation. No significant soft tissue swelling. Tiny Achilles spur. IMPRESSION: Negative. Electronically Signed   By: Jeronimo Greaves M.D.   On: 01/12/2023 15:32   DG Foot Complete Left  Result Date: 01/12/2023 CLINICAL DATA:  Severe foot pain after trauma, concern for metatarsal fracture EXAM: LEFT FOOT - COMPLETE 3+ VIEW COMPARISON:  None Available. FINDINGS: No acute fracture or dislocation. Joint spaces and alignment are maintained. Enthesopathic changes of the Achilles tendon. No area of erosion or osseous destruction. No unexpected radiopaque foreign body. Soft tissues are unremarkable. IMPRESSION: 1. No acute fracture or dislocation. 2. If persistent clinical concern for Lisfranc injury, recommend dedicated weight-bearing radiographs. Electronically Signed   By: Meda Klinefelter M.D.   On: 01/12/2023 13:26    Procedures Procedures (including critical care time)  Medications Ordered in UC Medications  HYDROcodone-acetaminophen (NORCO/VICODIN) 5-325 MG per tablet 1 tablet (1 tablet Oral Given 01/12/23 1220)    Initial  Impression / Assessment and Plan / UC Course  I have reviewed the triage vital signs and the nursing notes.  Pertinent labs & imaging results that were available during my care of the patient were reviewed by me and considered in my medical decision making (see chart for details).     Left  lower extremity neuropathy -The patient's pain is neuropathic in nature following the common fibular distribution but not originating from the lower back or hip.  Her pain is reproducible with palpation over the fibular head and gastroc.  Her mechanism of injury is not concerning for extensive fractures, and she has minimal risk factors except for smoking. Her symptoms not consistent with claudication. - No concern for compartment syndrome as her symptoms are not worsening over time, not constant numbness or change in distal blood flow. - X-ray tibia and fibula: No acute fractures - X-ray of the left foot shows no fractures or abnormalities - Norco given for pain relief in clinic.  The patient responded well which allowed for further physical exam.  -We will start a short course of gabapentin and recommend follow-up with orthopedic -- Currently there is no indication for splinting and I believe a knee brace would worsen her sxs.  --Pt counseled on precautions and safety with gabapentin.  -- Pt voiced understanding and agreed with the plan.     Final Clinical Impressions(s) / UC Diagnoses   Final diagnoses:  Neuropathy of left lower extremity     Discharge Instructions      Start gabapentin twice daily for the next 2 weeks Continue active range of motion but avoid trauma to the out side of your left leg or foot If you develop worsening pain, numbness, or foot drop follow-up with the emergency care immediately We recommend following up with orthopedic office within the next 1-2 weeks     ED Prescriptions     Medication Sig Dispense Auth. Provider   gabapentin (NEURONTIN) 400 MG capsule Take 1 capsule (400 mg total) by mouth 2 (two) times daily for 14 days. 28 capsule Ivor Messier, MD      PDMP not reviewed this encounter.   Ivor Messier, MD 01/12/23 1949

## 2023-01-12 NOTE — ED Triage Notes (Signed)
Pt c/o of left foot injury that started Saturday. States it hurts on outside of foot and on bottom.

## 2023-01-12 NOTE — Discharge Instructions (Signed)
Start gabapentin twice daily for the next 2 weeks Continue active range of motion but avoid trauma to the out side of your left leg or foot If you develop worsening pain, numbness, or foot drop follow-up with the emergency care immediately We recommend following up with orthopedic office within the next 1-2 weeks

## 2023-01-16 NOTE — Plan of Care (Signed)
[facility] Tonsillectomy/Adenoidectomy, Postoperative PEDS care plan entered in error.

## 2023-08-05 ENCOUNTER — Encounter (HOSPITAL_COMMUNITY): Payer: Self-pay

## 2023-08-05 ENCOUNTER — Emergency Department (HOSPITAL_COMMUNITY)
Admission: EM | Admit: 2023-08-05 | Discharge: 2023-08-07 | Disposition: A | Discharge status: Other Acute Inpatient Hospital | Payer: Self-pay

## 2023-08-05 ENCOUNTER — Other Ambulatory Visit: Payer: Self-pay

## 2023-08-05 DIAGNOSIS — R45851 Suicidal ideations: Secondary | ICD-10-CM

## 2023-08-05 DIAGNOSIS — F332 Major depressive disorder, recurrent severe without psychotic features: Secondary | ICD-10-CM | POA: Diagnosis present

## 2023-08-05 DIAGNOSIS — J45909 Unspecified asthma, uncomplicated: Secondary | ICD-10-CM | POA: Insufficient documentation

## 2023-08-05 LAB — RAPID URINE DRUG SCREEN, HOSP PERFORMED
Amphetamines: NOT DETECTED
Barbiturates: NOT DETECTED
Benzodiazepines: NOT DETECTED
Cocaine: NOT DETECTED
Opiates: NOT DETECTED
Tetrahydrocannabinol: POSITIVE — AB

## 2023-08-05 LAB — CBC WITH DIFFERENTIAL/PLATELET
Abs Immature Granulocytes: 0.06 10*3/uL (ref 0.00–0.07)
Basophils Absolute: 0.1 10*3/uL (ref 0.0–0.1)
Basophils Relative: 1 %
Eosinophils Absolute: 0.2 10*3/uL (ref 0.0–0.5)
Eosinophils Relative: 2 %
HCT: 37.8 % (ref 36.0–46.0)
Hemoglobin: 12.4 g/dL (ref 12.0–15.0)
Immature Granulocytes: 1 %
Lymphocytes Relative: 24 %
Lymphs Abs: 3.2 10*3/uL (ref 0.7–4.0)
MCH: 32.9 pg (ref 26.0–34.0)
MCHC: 32.8 g/dL (ref 30.0–36.0)
MCV: 100.3 fL — ABNORMAL HIGH (ref 80.0–100.0)
Monocytes Absolute: 1 10*3/uL (ref 0.1–1.0)
Monocytes Relative: 7 %
Neutro Abs: 8.8 10*3/uL — ABNORMAL HIGH (ref 1.7–7.7)
Neutrophils Relative %: 65 %
Platelets: 319 10*3/uL (ref 150–400)
RBC: 3.77 MIL/uL — ABNORMAL LOW (ref 3.87–5.11)
RDW: 12.5 % (ref 11.5–15.5)
WBC: 13.3 10*3/uL — ABNORMAL HIGH (ref 4.0–10.5)
nRBC: 0 % (ref 0.0–0.2)

## 2023-08-05 LAB — COMPREHENSIVE METABOLIC PANEL WITH GFR
ALT: 12 U/L (ref 0–44)
AST: 17 U/L (ref 15–41)
Albumin: 3.9 g/dL (ref 3.5–5.0)
Alkaline Phosphatase: 46 U/L (ref 38–126)
Anion gap: 9 (ref 5–15)
BUN: 9 mg/dL (ref 6–20)
CO2: 25 mmol/L (ref 22–32)
Calcium: 8.9 mg/dL (ref 8.9–10.3)
Chloride: 104 mmol/L (ref 98–111)
Creatinine, Ser: 0.95 mg/dL (ref 0.44–1.00)
GFR, Estimated: >60 mL/min (ref >60)
Glucose, Bld: 121 mg/dL — ABNORMAL HIGH (ref 70–99)
Potassium: 2.7 mmol/L — CL (ref 3.5–5.1)
Sodium: 138 mmol/L (ref 135–145)
Total Bilirubin: 0.4 mg/dL (ref 0.0–1.2)
Total Protein: 7.7 g/dL (ref 6.5–8.1)

## 2023-08-05 LAB — ETHANOL: Alcohol, Ethyl (B): <15 mg/dL (ref <15)

## 2023-08-05 LAB — MAGNESIUM: Magnesium: 2.3 mg/dL (ref 1.7–2.4)

## 2023-08-05 MED ORDER — OLANZAPINE 5 MG PO TBDP
5.0000 mg | ORAL_TABLET | Freq: Every day | ORAL | Status: DC
  Administered 2023-08-05 – 2023-08-06 (×2): 5 mg via ORAL
  Filled 2023-08-05 (×2): qty 1

## 2023-08-05 MED ORDER — POTASSIUM CHLORIDE CRYS ER 20 MEQ PO TBCR
40.0000 meq | EXTENDED_RELEASE_TABLET | Freq: Once | ORAL | Status: AC
  Administered 2023-08-05: 40 meq via ORAL
  Filled 2023-08-05: qty 2

## 2023-08-05 MED ORDER — TRAZODONE HCL 50 MG PO TABS: 50.0000 mg | ORAL_TABLET | Freq: Every evening | ORAL | Status: DC | PRN

## 2023-08-05 MED ORDER — GABAPENTIN 100 MG PO CAPS
200.0000 mg | ORAL_CAPSULE | Freq: Two times a day (BID) | ORAL | Status: DC
  Administered 2023-08-05 – 2023-08-07 (×4): 200 mg via ORAL
  Filled 2023-08-05 (×4): qty 2

## 2023-08-05 MED ORDER — OXCARBAZEPINE 150 MG PO TABS
150.0000 mg | ORAL_TABLET | Freq: Every day | ORAL | Status: DC
  Administered 2023-08-05 – 2023-08-07 (×3): 150 mg via ORAL
  Filled 2023-08-05 (×3): qty 1

## 2023-08-05 NOTE — ED Notes (Signed)
 Critical K+ 2.7. Nurse and provider made aware.

## 2023-08-05 NOTE — ED Triage Notes (Signed)
 Pt to er for SI, pt states that she has been feeling like harming herself for the past week, states that she was going to take some pills and her kids caught her and made her come to the er.
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
## 2023-08-05 NOTE — ED Notes (Signed)
 Ate 25% of tray.

## 2023-08-05 NOTE — ED Provider Notes (Signed)
 Received patient in signout from previous provider for SI pending medical clearance and TTS consult.  See their note.  In short, patient presents for SI with a plan to take "a bunch of pills" following her fianc leaving her a week ago. She is currently voluntarily here for SI.  ED workup notable for K+ 2.7 - replaced with oral potassium 40mEq. EKG without flattened T waves. Encourage high potassium diet and recheck this week by provider  Consulted TTS for SI. Dispo pending their recommendation  Discussed ED workup, plan with pt who agrees with plan   Royann Cords, PA 08/05/23 1702    Jerilynn Montenegro, MD 08/05/23 2259

## 2023-08-05 NOTE — ED Notes (Addendum)
 Pt states still suicidal. Attempted taking pills and if that did not work, plans to jump off bridge. Visibly upset and states hearing voices. VS within normal ranges. Given a sandwich and juice. She ate half. Dinner tray at bedside. No prohibited items in room. Bed low. Belongings in locker 35 and locker below 35.

## 2023-08-05 NOTE — ED Notes (Signed)
 Pt was sleeping when I approached with medications. Pt took medications with no objection.

## 2023-08-05 NOTE — ED Notes (Signed)
 Pt has 3 bags in [address] cabinet and she was dressed out. Pt was wanded by security

## 2023-08-05 NOTE — ED Provider Notes (Signed)
 [place] EMERGENCY DEPARTMENT AT [facility] Provider Note   CSN: [id number] Arrival date & time: 08/05/23  1421     History  Chief Complaint  Patient presents with   Suicidal   HPI Valerie Lee is a 47 y.o. female presenting for suicidal ideation.  Started about a week ago.  States she went to a church function came home and her fianc had left without warning.  She states that this was a trigger for suicidal thoughts.  States she had a plan to take "a bunch of pills" but her children caught her and advised her to come to the ED.  She is here voluntarily and wanting help.  Denies HI and hallucinations.  Past Medical History:  Diagnosis Date   Anemia    Anxiety    Asthma    rarely uses albuterol  rescue   Bipolar 1 disorder (HCC)    Depression    Paranoia (HCC)    Schizophrenia (HCC)    Schizophrenia (HCC)      HPI     Home Medications Prior to Admission medications   Medication Sig Start Date End Date Taking? Authorizing Provider  ARIPiprazole  (ABILIFY ) 10 MG tablet Take 1 tablet (10 mg total) by mouth at bedtime. For mood stabilization 10/07/16   Money, Christella Coventry, FNP  benzonatate  (TESSALON ) 100 MG capsule Take 1 capsule (100 mg total) by mouth 3 (three) times daily as needed for cough. Patient not taking: Reported on 01/12/2023 05/05/22   Corine Dice, MD  gabapentin  (NEURONTIN ) 400 MG capsule Take 1 capsule (400 mg total) by mouth 2 (two) times daily for 14 days. 01/12/23 01/26/23  Claybon Cuna, MD  hydrOXYzine  (ATARAX /VISTARIL ) 25 MG tablet Take 25 mg by mouth at bedtime as needed (sleep).    [provider]  lamoTRIgine  (LAMICTAL ) 25 MG tablet Take 1 tablet (25 mg total) by mouth daily. For mood stabilization Patient not taking: Reported on 01/12/2023 10/08/16   Money, Christella Coventry, FNP  lidocaine  (XYLOCAINE ) 2 % solution Use as directed 15 mLs in the mouth or throat as needed for mouth pain. 12/06/19   Khatri, Hina, PA-C  lurasidone   (LATUDA ) 40 MG TABS tablet Take 40 mg by mouth daily with breakfast.    [provider]  metoCLOPramide  (REGLAN ) 10 MG tablet Take 1 tablet (10 mg total) by mouth every 6 (six) hours as needed for nausea (nausea/headache). 09/14/21   Dalene Duck, MD  naproxen  (NAPROSYN ) 500 MG tablet Take 1 tablet (500 mg total) by mouth 2 (two) times daily. 05/06/22   Ann Keto, MD  ondansetron  (ZOFRAN -ODT) 4 MG disintegrating tablet Take 1 tablet (4 mg total) by mouth every 8 (eight) hours as needed for nausea or vomiting. 05/05/22   Lamptey, Donley Furth, MD  tiZANidine  (ZANAFLEX ) 4 MG tablet Take 1 tablet (4 mg total) by mouth every 8 (eight) hours as needed for muscle spasms. 09/12/21   Ann Keto, MD  traZODone  (DESYREL ) 50 MG tablet Take 1 tablet (50 mg total) by mouth at bedtime as needed for sleep. 10/07/16   Money, Christella Coventry, FNP      Allergies    Bactrim, Blue dyes (parenteral), Codeine, Penicillins, Aspirin, and Ibuprofen    Review of Systems   See HPI  Physical Exam Updated Vital Signs BP (!) 140/88 (BP Location: Left Arm)   Pulse (!) 112   Temp 98.8 F (37.1 C)   Resp 18   Ht 5' (1.524 m)  Wt 49.9 kg   LMP 04/12/2011 Comment: neg hcg on 07/25/2019  SpO2 98%   BMI 21.48 kg/m  Physical Exam Vitals and nursing note reviewed.  HENT:     Head: Normocephalic and atraumatic.     Mouth/Throat:     Mouth: Mucous membranes are moist.  Eyes:     General:        Right eye: No discharge.        Left eye: No discharge.     Conjunctiva/sclera: Conjunctivae normal.  Cardiovascular:     Rate and Rhythm: Normal rate and regular rhythm.     Pulses: Normal pulses.     Heart sounds: Normal heart sounds.  Pulmonary:     Effort: Pulmonary effort is normal.     Breath sounds: Normal breath sounds.  Abdominal:     General: Abdomen is flat.     Palpations: Abdomen is soft.  Skin:    General: Skin is warm and dry.  Neurological:     General: No focal deficit present.   Psychiatric:        Mood and Affect: Mood normal.     ED Results / Procedures / Treatments   Labs (all labs ordered are listed, but only abnormal results are displayed) Labs Reviewed  COMPREHENSIVE METABOLIC PANEL WITH GFR  ETHANOL  RAPID URINE DRUG SCREEN, HOSP PERFORMED  CBC WITH DIFFERENTIAL/PLATELET    EKG None  Radiology No results found.  Procedures Procedures    Medications Ordered in ED Medications - No data to display  ED Course/ Medical Decision Making/ A&P                                 Medical Decision Making  48 year old well-appearing female presenting for suicidal ideation with plan.  Exam is unremarkable.  Currently in observation status awaiting medical clearance.  Will plan to hold for TTS evaluation.  IVC not indicated at this time.  Signed out to PA Paris Bolds.         Final Clinical Impression(s) / ED Diagnoses Final diagnoses:  Suicidal ideation    Rx / DC Orders ED Discharge Orders     None         Janalee Mcmurray, PA-C 08/05/23 1446    Rolinda Climes, DO 08/06/23 1115

## 2023-08-06 LAB — BASIC METABOLIC PANEL WITH GFR
Anion gap: 9 (ref 5–15)
BUN: 10 mg/dL (ref 6–20)
CO2: 27 mmol/L (ref 22–32)
Calcium: 9.5 mg/dL (ref 8.9–10.3)
Chloride: 104 mmol/L (ref 98–111)
Creatinine, Ser: 0.98 mg/dL (ref 0.44–1.00)
GFR, Estimated: >60 mL/min (ref >60)
Glucose, Bld: 91 mg/dL (ref 70–99)
Potassium: 3.3 mmol/L — ABNORMAL LOW (ref 3.5–5.1)
Sodium: 140 mmol/L (ref 135–145)

## 2023-08-06 MED ORDER — POTASSIUM CHLORIDE CRYS ER 20 MEQ PO TBCR
40.0000 meq | EXTENDED_RELEASE_TABLET | Freq: Once | ORAL | Status: AC
  Administered 2023-08-06: 40 meq via ORAL
  Filled 2023-08-06: qty 2

## 2023-08-06 NOTE — Progress Notes (Signed)
 CSW sent additional BH referral information to [facility] for continued review. CSW will continue to monitor the patient to secure recommended disposition.    Phares Brasher, MSW, LCSW-A  12:39 PM 08/06/2023

## 2023-08-06 NOTE — ED Provider Notes (Signed)
 Emergency Medicine Observation Re-evaluation Note  Valerie Lee is a 48 y.o. female, seen on rounds today.  Pt initially presented to the ED for complaints of Suicidal Currently, the patient is resting.  Physical Exam  BP 122/62   Pulse 60   Temp 98.2 F (36.8 C)   Resp 16   Ht 5' (1.524 m)   Wt 49.9 kg   LMP 04/12/2011 Comment: neg hcg on 07/25/2019  SpO2 100%   BMI 21.48 kg/m  Physical Exam General: NAD   ED Course / MDM  EKG:EKG Interpretation Date/Time:  Saturday Aug 05 2023 16:39:41 EDT Ventricular Rate:  55 PR Interval:  158 QRS Duration:  76 QT Interval:  404 QTC Calculation: 386 R Axis:   83  Text Interpretation: Sinus bradycardia Cannot rule out Anterior infarct , age undetermined  overall similar to 2021 Confirmed by Jerilynn Montenegro [phone]) on 08/05/2023 4:44:23 PM  I have reviewed the labs performed to date as well as medications administered while in observation.  Recent changes in the last 24 hours include no acute events reported.  Plan  Current plan is for inpatient placement. Repeat BMP ordered to recheck K.    Burnette Carte, MD 08/06/23 [phone]

## 2023-08-06 NOTE — ED Notes (Addendum)
Supper tray given.

## 2023-08-06 NOTE — Progress Notes (Signed)
 Patient has been denied by [facility] due to no appropriate beds available. Patient meets BH inpatient criteria per Arvell Latin, NP. Patient has been faxed out to the following facilities:   [facility] [address] [place] [address] [phone]  [facility] [address] [place] [address] [phone]  [facility] [address], [place] [address] [phone]  [facility] [place] [address] [place], [place] [address] [phone]  [facility] Patient Placement [facility], [place] [phone]  [facility] [address] Johnella Naas [place] [address] [phone]  [facility] [address] [place] [address] [phone]  [facility] [address] [place] [address] [phone]  [facility] [address], [place] [phone]  [facility] [address] [place] [address] [phone]  [facility] [address] [place] [address] [phone]  [facility] [address] [place] [address] [phone]  [facility] [address], [place] [address] [phone]  [facility] [address] [place], [place] [address] [phone]  [facility] [address] [place] [address] [phone]  [facility] [address], [place] [address] [phone]  [facility] [address], [place] [address] [phone]   [facility] [address] [place] [address] [phone]  [facility] Josephina [place] [address] [phone]  [facility] [address] [place] [address] [phone]   Phares Brasher, MSW, LCSW-A  10:21 AM 08/06/2023

## 2023-08-06 NOTE — ED Notes (Signed)
 [facility] requested pt sign voluntary consent at this time. Pt sleeping at this time and when woken up pt stated she just wanted to sleep and did not want to be bothered at this time. Will try for signature at later time.

## 2023-08-06 NOTE — Consult Note (Cosign Needed Addendum)
 Joele Zeno 48 year old African-American female. seen and evaluated face-to-face by this provider.  She continues to endorse suicidal ideations stating decline in her mental health.  Patient is tearful throughout this assessment.    Per chart review patient currently under review for inpatient admission.Home medications was restarted on admission.  Staff to continue to monitor for safety.  Per admission assessment note:"Yatzil L Isom-Davis is a 48 y.o. female admitted: Presented to the EDfor 08/05/2023  2:24 PM for Depression, Suicide ideation/Suicide Gesture. She carries the psychiatric diagnoses of Paranoid Schizophrenia, MDD, Bipolar disorder, Anxiety and has a past medical history of  Fibroid Uterus, Hyperprolactinemia.

## 2023-08-06 NOTE — Progress Notes (Signed)
 CSW spoke with the Intake RN at [facility] who stated the patient has been declined due to her "not being medically stable." CSW shared this information to the care team.    Phares Brasher, MSW, LCSW-A  1:54 PM 08/06/2023

## 2023-08-06 NOTE — ED Notes (Signed)
 Pt took a shower

## 2023-08-07 ENCOUNTER — Other Ambulatory Visit: Payer: Self-pay

## 2023-08-07 ENCOUNTER — Encounter (HOSPITAL_COMMUNITY): Payer: Self-pay | Admitting: Nurse Practitioner

## 2023-08-07 ENCOUNTER — Inpatient Hospital Stay (HOSPITAL_COMMUNITY)
Admission: AD | Admit: 2023-08-07 | Discharge: 2023-08-17 | DRG: 885 | Disposition: A | Discharge status: Home / Self Care | Payer: Self-pay | Source: Intra-hospital | Attending: Psychiatry | Admitting: Psychiatry

## 2023-08-07 DIAGNOSIS — F17210 Nicotine dependence, cigarettes, uncomplicated: Secondary | ICD-10-CM | POA: Diagnosis present

## 2023-08-07 DIAGNOSIS — E559 Vitamin D deficiency, unspecified: Secondary | ICD-10-CM | POA: Diagnosis present

## 2023-08-07 DIAGNOSIS — Z91199 Patient's noncompliance with other medical treatment and regimen due to unspecified reason: Secondary | ICD-10-CM

## 2023-08-07 DIAGNOSIS — Z818 Family history of other mental and behavioral disorders: Secondary | ICD-10-CM | POA: Diagnosis not present

## 2023-08-07 DIAGNOSIS — Z9049 Acquired absence of other specified parts of digestive tract: Secondary | ICD-10-CM | POA: Diagnosis not present

## 2023-08-07 DIAGNOSIS — G4700 Insomnia, unspecified: Secondary | ICD-10-CM | POA: Diagnosis present

## 2023-08-07 DIAGNOSIS — Z880 Allergy status to penicillin: Secondary | ICD-10-CM | POA: Diagnosis not present

## 2023-08-07 DIAGNOSIS — Z885 Allergy status to narcotic agent status: Secondary | ICD-10-CM | POA: Diagnosis not present

## 2023-08-07 DIAGNOSIS — Z79899 Other long term (current) drug therapy: Secondary | ICD-10-CM | POA: Diagnosis not present

## 2023-08-07 DIAGNOSIS — F332 Major depressive disorder, recurrent severe without psychotic features: Principal | ICD-10-CM | POA: Diagnosis present

## 2023-08-07 DIAGNOSIS — Z90710 Acquired absence of both cervix and uterus: Secondary | ICD-10-CM

## 2023-08-07 DIAGNOSIS — Z91411 Personal history of adult psychological abuse: Secondary | ICD-10-CM | POA: Diagnosis not present

## 2023-08-07 DIAGNOSIS — R45851 Suicidal ideations: Secondary | ICD-10-CM | POA: Diagnosis present

## 2023-08-07 DIAGNOSIS — Z888 Allergy status to other drugs, medicaments and biological substances status: Secondary | ICD-10-CM

## 2023-08-07 DIAGNOSIS — F200 Paranoid schizophrenia: Principal | ICD-10-CM | POA: Diagnosis present

## 2023-08-07 DIAGNOSIS — Z8249 Family history of ischemic heart disease and other diseases of the circulatory system: Secondary | ICD-10-CM

## 2023-08-07 DIAGNOSIS — F323 Major depressive disorder, single episode, severe with psychotic features: Principal | ICD-10-CM

## 2023-08-07 DIAGNOSIS — F411 Generalized anxiety disorder: Secondary | ICD-10-CM | POA: Diagnosis present

## 2023-08-07 DIAGNOSIS — Z886 Allergy status to analgesic agent status: Secondary | ICD-10-CM | POA: Diagnosis not present

## 2023-08-07 DIAGNOSIS — Z91410 Personal history of adult physical and sexual abuse: Secondary | ICD-10-CM | POA: Diagnosis not present

## 2023-08-07 DIAGNOSIS — Z825 Family history of asthma and other chronic lower respiratory diseases: Secondary | ICD-10-CM

## 2023-08-07 MED ORDER — HYDROXYZINE HCL 25 MG PO TABS
25.0000 mg | ORAL_TABLET | Freq: Three times a day (TID) | ORAL | Status: DC | PRN
  Administered 2023-08-07 – 2023-08-16 (×12): 25 mg via ORAL
  Filled 2023-08-07 (×12): qty 1

## 2023-08-07 MED ORDER — TRAZODONE HCL 50 MG PO TABS: 50.0000 mg | ORAL_TABLET | Freq: Every evening | ORAL | Status: DC | PRN

## 2023-08-07 MED ORDER — GABAPENTIN 100 MG PO CAPS
200.0000 mg | ORAL_CAPSULE | Freq: Two times a day (BID) | ORAL | Status: DC
  Administered 2023-08-07 – 2023-08-17 (×20): 200 mg via ORAL
  Filled 2023-08-07 (×22): qty 2

## 2023-08-07 MED ORDER — LORAZEPAM 2 MG/ML IJ SOLN: 2.0000 mg | Freq: Three times a day (TID) | INTRAMUSCULAR | Status: DC | PRN

## 2023-08-07 MED ORDER — ALUM & MAG HYDROXIDE-SIMETH 200-200-20 MG/5ML PO SUSP: 30.0000 mL | ORAL | Status: DC | PRN

## 2023-08-07 MED ORDER — ACETAMINOPHEN 325 MG PO TABS
650.0000 mg | ORAL_TABLET | Freq: Four times a day (QID) | ORAL | Status: DC | PRN
  Administered 2023-08-10 – 2023-08-14 (×8): 650 mg via ORAL
  Filled 2023-08-07 (×8): qty 2

## 2023-08-07 MED ORDER — DIPHENHYDRAMINE HCL 50 MG/ML IJ SOLN: 50.0000 mg | Freq: Three times a day (TID) | INTRAMUSCULAR | Status: DC | PRN

## 2023-08-07 MED ORDER — OLANZAPINE 5 MG PO TBDP
5.0000 mg | ORAL_TABLET | Freq: Every day | ORAL | Status: DC
  Administered 2023-08-07 – 2023-08-08 (×2): 5 mg via ORAL
  Filled 2023-08-07 (×4): qty 1

## 2023-08-07 MED ORDER — OLANZAPINE 5 MG PO TBDP
5.0000 mg | ORAL_TABLET | Freq: Every day | ORAL | Status: DC
  Filled 2023-08-07 (×2): qty 1

## 2023-08-07 MED ORDER — OXCARBAZEPINE 150 MG PO TABS
150.0000 mg | ORAL_TABLET | Freq: Every day | ORAL | Status: DC
Start: 2023-08-07 — End: 2023-08-11
  Administered 2023-08-08 – 2023-08-11 (×4): 150 mg via ORAL
  Filled 2023-08-07 (×8): qty 1

## 2023-08-07 MED ORDER — DIPHENHYDRAMINE HCL 25 MG PO CAPS
50.0000 mg | ORAL_CAPSULE | Freq: Three times a day (TID) | ORAL | Status: DC | PRN
  Administered 2023-08-09: 50 mg via ORAL
  Filled 2023-08-07: qty 2

## 2023-08-07 MED ORDER — TRAZODONE HCL 50 MG PO TABS
50.0000 mg | ORAL_TABLET | Freq: Every evening | ORAL | Status: DC | PRN
  Administered 2023-08-07 – 2023-08-08 (×2): 50 mg via ORAL
  Filled 2023-08-07 (×2): qty 1

## 2023-08-07 MED ORDER — HALOPERIDOL LACTATE 5 MG/ML IJ SOLN: 10.0000 mg | Freq: Three times a day (TID) | INTRAMUSCULAR | Status: DC | PRN

## 2023-08-07 MED ORDER — GABAPENTIN 100 MG PO CAPS
200.0000 mg | ORAL_CAPSULE | Freq: Two times a day (BID) | ORAL | Status: DC
  Filled 2023-08-07 (×5): qty 2

## 2023-08-07 MED ORDER — HALOPERIDOL LACTATE 5 MG/ML IJ SOLN
5.0000 mg | Freq: Three times a day (TID) | INTRAMUSCULAR | Status: DC | PRN
Start: 2023-08-07 — End: 2023-08-17

## 2023-08-07 MED ORDER — MAGNESIUM HYDROXIDE 400 MG/5ML PO SUSP: 30.0000 mL | Freq: Every day | ORAL | Status: DC | PRN

## 2023-08-07 MED ORDER — HALOPERIDOL 5 MG PO TABS
5.0000 mg | ORAL_TABLET | Freq: Three times a day (TID) | ORAL | Status: DC | PRN
  Administered 2023-08-09: 5 mg via ORAL
  Filled 2023-08-07: qty 1

## 2023-08-07 NOTE — Tx Team (Signed)
 Initial Treatment Plan 08/07/2023 5:13 PM [name] ONG:[id number]    PATIENT STRESSORS: Marital or family conflict   Substance abuse     PATIENT STRENGTHS: Ability for insight    PATIENT IDENTIFIED PROBLEMS: Suicidal ideation with the plan OD on medication  Worsening depression depression as evidence by complaint of loss of relationship. "My fiancee walked away from me.                   DISCHARGE CRITERIA:  Ability to meet basic life and health needs Withdrawal symptoms are absent or subacute and managed without 24-hour nursing intervention  PRELIMINARY DISCHARGE PLAN: Return to previous living arrangement Return to previous work or school arrangements  PATIENT/FAMILY INVOLVEMENT: This treatment plan has been presented to and reviewed with the patient, Valerie Lee,  has been given the opportunity to ask questions and make suggestions.  Jeoffrey Mole, RN 08/07/2023, 5:13 PM

## 2023-08-07 NOTE — Progress Notes (Signed)
 Admission note: Patient is a 48 year old African female, voluntarily  admitted from ED for worsening depression, and suicidal ideation with a plan to overdose on medication. Patient arrived to the unit via safe transport, awake, alert, and oriented X's 4. Patient presented with a depressed mood and flat affect on arrival to the [facility] unit, she states she came in the hospital because she had the thoughts to hurt herself. According to her "I was about to take the pills when my children saw me and stopped me." Patient states her fiancee told her to stop taking her medication, and "he walked out on me and didn't come back." Patient states she wants to work on getting herself back, "I just want my life back, I need to be back on my medication, and I want to stop crying."  Patient states she lives in an apartment with her 14, and 24 years old biological kids. Patient denies SI/HI/AVH during admission interview with. No acute distress noted at this time.  Pt has  orientation to unit, room and routine. Information packet given to patient and safety information discussed with her.  Admission INP armband ID verified with patient, and in place, fall risk assessment completed with her ,and no evidence of bruising, or skin tears and tracks marks during skin assessment. Q 15 minutes safety observation in place. Staff will continue to provide support to patient.

## 2023-08-07 NOTE — Plan of Care (Signed)
   Problem: Education: Goal: Emotional status will improve Outcome: Progressing Goal: Mental status will improve Outcome: Progressing   Problem: Activity: Goal: Interest or engagement in activities will improve Outcome: Progressing Goal: Sleeping patterns will improve Outcome: Progressing

## 2023-08-07 NOTE — Progress Notes (Signed)
 Pt has been accepted to [facility] on 08/07/2023 Bed assignment: 402-1  Pt meets inpatient criteria per: Dan Dun NP  Attending Physician will be: Zouev MD   Report can be called to: unit: Adult unit: [phone]  Pt can arrive after (pending items are received/pending discharges)  Care Team Notified: [facility] Kathryn Parish RN, Arvell Latin NP, Majorie Scrape  [place] Norabelle Kondo LCSW-A   08/07/2023 10:14 AM

## 2023-08-07 NOTE — ED Notes (Signed)
 Patient off unit to [facility] per provider. Patient alert, calm, cooperative, no s/s of distress. Discharge information and belongings given to Tree surgeon for facility. Patient ambulatory off unit, escorted by RN. Patient transported by [employer].

## 2023-08-07 NOTE — BHH Group Notes (Signed)
 Psychoeducational Group Note  Date:  08/07/2023 Time:  2000  Group Topic/Focus:  Alcoholics Anonymous Meeting  Participation Level: Did Not Attend  Participation Quality:  Not Applicable  Affect:  Not Applicable  Cognitive:  Not Applicable  Insight:  Not Applicable  Engagement in Group: Not Applicable  Additional Comments:  Did not attend.   Catharine Clock 08/07/2023, 9:43 PM

## 2023-08-07 NOTE — ED Provider Notes (Signed)
 Emergency Medicine Observation Re-evaluation Note  Valerie Lee is a 48 y.o. female, seen on rounds today.  Pt initially presented to the ED for complaints of Suicidal Currently, the patient is resting.  Physical Exam  BP 130/65 (BP Location: Right Arm)   Pulse 64   Temp 98.4 F (36.9 C) (Oral)   Resp 18   Ht 5' (1.524 m)   Wt 49.9 kg   LMP 04/12/2011 Comment: neg hcg on 07/25/2019  SpO2 100%   BMI 21.48 kg/m  Physical Exam General: NAD  ED Course / MDM  EKG:EKG Interpretation Date/Time:  Saturday Aug 05 2023 16:39:41 EDT Ventricular Rate:  55 PR Interval:  158 QRS Duration:  76 QT Interval:  404 QTC Calculation: 386 R Axis:   83  Text Interpretation: Sinus bradycardia Cannot rule out Anterior infarct , age undetermined  overall similar to 2021 Confirmed by Jerilynn Montenegro [phone]) on 08/05/2023 4:44:23 PM  I have reviewed the labs performed to date as well as medications administered while in observation.  Recent changes in the last 24 hours include no acute events reported.  Plan  Current plan is for placement.    Burnette Carte, MD 08/07/23 [phone]

## 2023-08-08 ENCOUNTER — Encounter (HOSPITAL_COMMUNITY): Payer: Self-pay | Admitting: Nurse Practitioner

## 2023-08-08 DIAGNOSIS — F323 Major depressive disorder, single episode, severe with psychotic features: Principal | ICD-10-CM

## 2023-08-08 LAB — LIPID PANEL
Cholesterol: 188 mg/dL (ref 0–200)
HDL: 45 mg/dL (ref >40)
LDL Cholesterol: 118 mg/dL — ABNORMAL HIGH (ref 0–99)
Total CHOL/HDL Ratio: 4.2 ratio
Triglycerides: 127 mg/dL (ref <150)
VLDL: 25 mg/dL (ref 0–40)

## 2023-08-08 LAB — BASIC METABOLIC PANEL WITH GFR
Anion gap: 9 (ref 5–15)
BUN: 15 mg/dL (ref 6–20)
CO2: 24 mmol/L (ref 22–32)
Calcium: 9.4 mg/dL (ref 8.9–10.3)
Chloride: 104 mmol/L (ref 98–111)
Creatinine, Ser: 0.91 mg/dL (ref 0.44–1.00)
GFR, Estimated: >60 mL/min (ref >60)
Glucose, Bld: 125 mg/dL — ABNORMAL HIGH (ref 70–99)
Potassium: 3.8 mmol/L (ref 3.5–5.1)
Sodium: 137 mmol/L (ref 135–145)

## 2023-08-08 LAB — TSH: TSH: 0.609 u[IU]/mL (ref 0.350–4.500)

## 2023-08-08 LAB — VITAMIN B12: Vitamin B-12: 226 pg/mL (ref 180–914)

## 2023-08-08 LAB — VITAMIN D 25 HYDROXY (VIT D DEFICIENCY, FRACTURES): Vit D, 25-Hydroxy: 21.39 ng/mL — ABNORMAL LOW (ref 30–100)

## 2023-08-08 MED ORDER — NICOTINE 14 MG/24HR TD PT24
14.0000 mg | MEDICATED_PATCH | Freq: Every day | TRANSDERMAL | Status: DC
  Administered 2023-08-08 – 2023-08-16 (×8): 14 mg via TRANSDERMAL
  Filled 2023-08-08 (×10): qty 1

## 2023-08-08 NOTE — BHH Group Notes (Signed)
 BHH Group Notes:  (Nursing/MHT/Case Management/Adjunct)  Date:  08/08/2023  Time:  2000  Type of Therapy:  Wrap up group  Participation Level:  Active  Participation Quality:  Appropriate, Attentive, Sharing, and Supportive  Affect:  Appropriate  Cognitive:  Alert  Insight:  Improving  Engagement in Group:  Engaged  Modes of Intervention:  Clarification, Education, and Socialization  Summary of Progress/Problems: Positive thinking and self-care were discussed.   Catharine Clock 08/08/2023, 9:48 PM

## 2023-08-08 NOTE — Plan of Care (Signed)

## 2023-08-08 NOTE — Group Note (Signed)
 LCSW Group Therapy Note   Group Date: 08/08/2023 Start Time: 1100 End Time: 1200   Participation:  patient was present and actively participated in the conversation.  She was insightful and shared personal experiences.  Type of Therapy:  Group Therapy  Topic:  Stress Less:  Nurturing Your Mind and Body Through Calm    Objective:  Learn techniques for managing stress through body relaxation, mindfulness, and self-compassion. Goals: Use body relaxation techniques, such as Box Breathing and Progressive Muscle Relaxation, to reduce physical tension. Practice mindfulness to break the cycle of overthinking and mental chatter. Embrace self-compassion to handle stress with kindness and resilience.  Summary:  Today's session focused on calming the body with relaxation techniques, breaking the cycle of stress with mindfulness, and using self-compassion to manage challenges more gracefully. These tools help reduce stress and foster a balanced, peaceful mindset.  Therapeutic Modalities used:  Elements of CBT ( cognitive restructuring)  Elements of DBT (box breathing, progressive body relaxation, mindfulness, acceptance)    Valerie Lee, LCSWA 08/08/2023  12:14 PM

## 2023-08-08 NOTE — BHH Suicide Risk Assessment (Signed)
 Suicide Risk Assessment  Admission Assessment    [facility] Admission Suicide Risk Assessment   Nursing information obtained from:    Demographic factors:  NA Current Mental Status:  NA Loss Factors:  Loss of significant relationship Historical Factors:  NA Risk Reduction Factors:  Positive social support  Total Time spent with patient: 45 minutes Principal Problem: MDD (major depressive disorder), recurrent severe, without psychosis (HCC) Diagnosis:  Principal Problem:   MDD (major depressive disorder), recurrent severe, without psychosis (HCC)  Subjective Data:  Valerie Lee is a 48 year old African-American female with prior psychiatric history significant for MDD recurrent severe without psychotic features, schizoaffective disorder mixed type, depression, and bipolar 1 disorder.  [facility] health ED at [facility] for worsening depression resulting in suicidal ideation with overdose intent in the context of  uninformed break-up by the fianc.  Continued Clinical Symptoms:  Alcohol Use Disorder Identification Test Final Score (AUDIT): 0 The "Alcohol Use Disorders Identification Test", Guidelines for Use in Primary Care, Second Edition.  World Science writer [facility]). Score between 0-7:  no or low risk or alcohol related problems. Score between 8-15:  moderate risk of alcohol related problems. Score between 16-19:  high risk of alcohol related problems. Score 20 or above:  warrants further diagnostic evaluation for alcohol dependence and treatment.  CLINICAL FACTORS:   Severe Anxiety and/or Agitation Depression:   Anhedonia Hopelessness Impulsivity Severe Schizophrenia:   Command hallucinatons Depressive state Paranoid or undifferentiated type More than one psychiatric diagnosis Currently Psychotic  Musculoskeletal: Strength & Muscle Tone: within normal limits Gait & Station: normal Patient leans:  N/A  Psychiatric Specialty Exam:  Presentation  General Appearance:  Bizarre; Disheveled  Eye Contact: Fair  Speech: Clear and Coherent  Speech Volume: Normal  Handedness: Right  Mood and Affect  Mood: Anxious; Depressed; Dysphoric  Affect: Congruent  Thought Process  Thought Processes: Disorganized; Coherent  Descriptions of Associations:Circumstantial  Orientation:Partial  Thought Content:Perseveration  History of Schizophrenia/Schizoaffective disorder:No data recorded Duration of Psychotic Symptoms:No data recorded Hallucinations:Hallucinations: Auditory; Command Description of Command Hallucinations: Hearing loud voices saying negative stuff Description of Auditory Hallucinations: Hearing loud voices saying negative stuff  Ideas of Reference:None  Suicidal Thoughts:Suicidal Thoughts: Yes, Passive SI Passive Intent and/or Plan: Without Intent; Without Plan; Without Means to Carry Out  Homicidal Thoughts:Homicidal Thoughts: No  Sensorium  Memory: Immediate Fair  Judgment: Poor  Insight: Poor  Executive Functions  Concentration: Poor  Attention Span: Poor  Recall: Poor  Fund of Knowledge: Poor  Language: Fair  Psychomotor Activity  Psychomotor Activity:No data recorded  Assets  Assets: Physical Health; Resilience  Sleep  Sleep: Sleep: -- (Unable to quantify sleep hours) Number of Hours of Sleep: 0 (Unable to quantify sleep hours)  Physical Exam: Physical Exam Vitals and nursing note reviewed.  HENT:     Head: Normocephalic.     Right Ear: External ear normal.     Left Ear: External ear normal.     Nose: Nose normal.     Mouth/Throat:     Mouth: Mucous membranes are moist.     Pharynx: Oropharynx is clear.  Eyes:     Extraocular Movements: Extraocular movements intact.  Cardiovascular:     Rate and Rhythm: Normal rate.     Pulses: Normal pulses.  Pulmonary:     Effort: Pulmonary effort is normal.  Abdominal:      Comments: Deferred  Genitourinary:    Comments: Deferred Musculoskeletal:  General: Normal range of motion.     Cervical back: Normal range of motion.  Skin:    General: Skin is warm.  Neurological:     General: No focal deficit present.     Mental Status: She is alert and oriented to person, place, and time.  Psychiatric:        Mood and Affect: Mood normal.        Thought Content: Thought content normal.    Review of Systems  Constitutional:  Negative for chills and fever.  HENT:  Negative for sore throat.   Respiratory:  Negative for cough, sputum production, shortness of breath and wheezing.   Cardiovascular:  Negative for chest pain and palpitations.  Gastrointestinal:  Negative for abdominal pain, diarrhea, heartburn, nausea and vomiting.  Genitourinary:  Negative for dysuria, frequency and urgency.  Musculoskeletal:  Negative for falls.  Skin:  Negative for itching and rash.  Neurological:  Negative for dizziness and headaches.  Endo/Heme/Allergies:        See allergy listing  Psychiatric/Behavioral:  Positive for depression, hallucinations, substance abuse and suicidal ideas. The patient is nervous/anxious.    Blood pressure 115/71, pulse 69, temperature 98 F (36.7 C), temperature source Oral, resp. rate 16, height 5' (1.524 m), weight 59.3 kg, last menstrual period 04/12/2011, SpO2 100%. Body mass index is 25.55 kg/m.  COGNITIVE FEATURES THAT CONTRIBUTE TO RISK:  Polarized thinking    SUICIDE RISK:   Severe:  Frequent, intense, and enduring suicidal ideation, specific plan, no subjective intent, but some objective markers of intent (i.e., choice of lethal method), the method is accessible, some limited preparatory behavior, evidence of impaired self-control, severe dysphoria/symptomatology, multiple risk factors present, and few if any protective factors, particularly a lack of social support.  PLAN OF CARE: Physician Treatment Plan for Secondary Diagnosis:   Assessment: Valerie Lee is a 48 year old African-American female with prior psychiatric history significant for MDD recurrent severe without psychotic features, schizoaffective disorder mixed type, depression, and bipolar 1 disorder.  [facility] health ED at [facility] for worsening depression resulting in suicidal ideation with overdose intent in the context of  uninformed break-up by the fianc. Principal Problem:   MDD (major depressive disorder), recurrent severe, without psychosis (HCC)  Plans: Medications: --Continue Zyprexa  disintegrating tab 5 mg p.o. at bedtime for psychosis --Continue Trileptal tablet 150 mg p.o. daily for mood stabilization --Continue gabapentin  capsule 200 mg p.o. twice daily -- Continue trazodone  50 mg p.o. q. nightly as needed for insomnia -- Continue hydroxyzine  25 mg p.o. 3 times daily as needed for anxiety.  Other PRN Medications  -Acetaminophen  650 mg every 6 as needed/mild pain  -Maalox 30 mL oral every 4 as needed/digestion  -Magnesium  hydroxide 30 mL daily as needed/mild constipation   --The risks/benefits/side-effects/alternatives to this medication were discussed in detail with the patient and time was given for questions. The patient consents to medication trial.    -- Metabolic profile and EKG monitoring obtained while on an atypical antipsychotic (BMI: Lipid Panel: HbgA1c: QTc:)   -- Encouraged patient to participate in unit milieu and in scheduled group therapies   Admission labs reviewed: CMP: Potassium level 3.3 replaced at the hospital, BMP ordered.  Otherwise normal.  CBC with differential: WBC 13.3 elevated, RBC 3.77 low, MCV 100.3 elevated, neutrophils 8.8 elevated, otherwise normal.  UDS positive for marijuana.  New labs ordered: BMP, hemoglobin A1c, vitamin D 25-hydroxy, vitamin B12, lipid panel, TSH.  EKG reviewed: Sinus bradycardia, ventricular rate 55, QT/QTc  404/386.  Continue BH Agitation Protocol  --Haldol  5 mg, oral, 3 times daily as needed, mild agitation  --Benadryl  50 mg, oral, 3 times daily as needed, mild agitation                                      OR   --Haldol  injection 5 mg, IM, 3 times daily as needed, moderate agitation  --Benadryl  injection 50 mg, IM, 3 times daily as needed, moderate agitation  --Ativan  injection 2 mg, IM, 3 times daily as needed, moderate agitation                                     OR  --Haldol  injection 10 mg, IM, 3 times daily as needed, severe agitation  --Benadryl  injection 50 mg, IM, 3 times daily as needed, severe agitation  --Ativan  injection 2 mg, IM, 3 times daily as needed, severe agitation   Safety and Monitoring:  Voluntary admission to inpatient psychiatric unit for safety, stabilization and treatment  Daily contact with patient to assess and evaluate symptoms and progress in treatment  Patient's case to be discussed in multi-disciplinary team meeting  Observation Level : q15 minute checks  Vital signs: q12 hours  Precautions: suicide, but pt currently verbally contracts for safety on unit?   Discharge Planning:  Social work and case management to assist with discharge planning and identification of hospital follow-up needs prior to discharge  Estimated LOS: 5-7?days   Discharge Concerns: Need to establish a safety plan; Medication compliance and effectiveness  Discharge Goals: Return home with outpatient referrals for mental health follow-up including medication management/psychotherapy.   I certify that inpatient services furnished can reasonably be expected to improve the patient's condition.   Laurence Pons, FNP 08/08/2023, 1:54 PM

## 2023-08-08 NOTE — Group Note (Signed)
 Recreation Therapy Group Note   Group Topic:Animal Assisted Therapy   Group Date: 08/08/2023 Start Time: 0945 End Time: 1030 Facilitators: Kyler Germer-McCall, LRT,CTRS Location: [address] Dayroom   Animal-Assisted Activity (AAA) Program Checklist/Progress Notes Patient Eligibility Criteria Checklist & Daily Group note for Rec Tx Intervention  AAA/T Program Assumption of Risk Form signed by Patient/ or Parent Legal Guardian Yes  Patient is free of allergies or severe asthma Yes  Patient reports no fear of animals Yes  Patient reports no history of cruelty to animals Yes  Patient understands his/her participation is voluntary Yes  Patient washes hands before animal contact Yes  Patient washes hands after animal contact Yes  Education: Hand Washing, Appropriate Animal Interaction   Education Outcome: Acknowledges education.    Affect/Mood: Appropriate   Participation Level: Engaged   Participation Quality: Independent   Behavior: Appropriate   Speech/Thought Process: Focused   Insight: Good   Judgement: Good   Modes of Intervention: Teaching laboratory technician   Patient Response to Interventions:  Engaged   Education Outcome:  In group clarification offered    Clinical Observations/Individualized Feedback: Patient attended session and interacted appropriately with therapy dog and peers. Patient asked appropriate questions about therapy dog and his training. Patient shared stories about their pets at home with group.   Plan: Continue to engage patient in RT group sessions 2-3x/week.   Marcia Lepera-McCall, LRT,CTRS  08/08/2023 12:53 PM

## 2023-08-08 NOTE — Group Note (Signed)
 Date:  08/08/2023 Time:  8:35 AM  Group Topic/Focus:  Goals Group:   The focus of this group is to help patients establish daily goals to achieve during treatment and discuss how the patient can incorporate goal setting into their daily lives to aide in recovery.    Participation Level:  Active  Participation Quality:  Appropriate  Affect:  Appropriate   Valerie Lee 08/08/2023, 8:35 AM

## 2023-08-08 NOTE — Plan of Care (Signed)
   Problem: Education: Goal: Emotional status will improve Outcome: Progressing Goal: Mental status will improve Outcome: Progressing   Problem: Activity: Goal: Interest or engagement in activities will improve Outcome: Progressing Goal: Sleeping patterns will improve Outcome: Progressing   Problem: Safety: Goal: Periods of time without injury will increase Outcome: Progressing

## 2023-08-08 NOTE — Progress Notes (Signed)
 Pt tearful and anxious this morning. Pt endorses auditory hallucinations as well as passive suicidal thoughts.

## 2023-08-08 NOTE — Progress Notes (Signed)
   08/08/23 2030  Psych Admission Type (Psych Patients Only)  Admission Status Voluntary  Psychosocial Assessment  Patient Complaints Depression  Eye Contact Fair  Facial Expression Anxious  Affect Depressed  Speech Logical/coherent  Interaction Assertive  Motor Activity Slow  Appearance/Hygiene Unremarkable  Behavior Characteristics Cooperative  Mood Anxious;Depressed  Aggressive Behavior  Effect No apparent injury  Thought Process  Coherency WDL  Content WDL  Delusions WDL  Perception Hallucinations  Hallucination Auditory;Visual  Judgment Impaired  Confusion WDL  Danger to Self  Current suicidal ideation? Passive  Self-Injurious Behavior Some self-injurious ideation observed or expressed.  No lethal plan expressed   Agreement Not to Harm Self Yes  Description of Agreement Verbal contract for safety  Danger to Others  Danger to Others None reported or observed
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
## 2023-08-08 NOTE — H&P (Addendum)
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 with discharge planning and identification of hospital follow-up needs prior to discharge  Estimated LOS: 5-7?days   Discharge Concerns: Need to establish a safety plan; Medication compliance and effectiveness  Discharge Goals: Return home with outpatient referrals for mental health follow-up including medication management/psychotherapy.   I certify that inpatient services furnished can reasonably be expected to improve the patient's condition.    Darcey Cardy C Marvette Schamp, FNP 5/13/20251:59 PM

## 2023-08-09 ENCOUNTER — Encounter (HOSPITAL_COMMUNITY): Payer: Self-pay

## 2023-08-09 LAB — HEMOGLOBIN A1C
Hgb A1c MFr Bld: 4.8 % (ref 4.8–5.6)
Mean Plasma Glucose: 91 mg/dL

## 2023-08-09 MED ORDER — OLANZAPINE 15 MG PO TBDP
15.0000 mg | ORAL_TABLET | Freq: Every day | ORAL | Status: DC
  Administered 2023-08-09 – 2023-08-12 (×4): 15 mg via ORAL
  Filled 2023-08-09 (×7): qty 1

## 2023-08-09 MED ORDER — TRAZODONE HCL 100 MG PO TABS
100.0000 mg | ORAL_TABLET | Freq: Every evening | ORAL | Status: DC | PRN
  Administered 2023-08-10 – 2023-08-16 (×7): 100 mg via ORAL
  Filled 2023-08-09 (×7): qty 1

## 2023-08-09 MED ORDER — VITAMIN D (ERGOCALCIFEROL) 1.25 MG (50000 UNIT) PO CAPS
50000.0000 [IU] | ORAL_CAPSULE | Freq: Every day | ORAL | Status: DC
  Administered 2023-08-09 – 2023-08-17 (×9): 50000 [IU] via ORAL
  Filled 2023-08-09 (×12): qty 1

## 2023-08-09 NOTE — Progress Notes (Signed)
 [facility] MD Progress Note  08/09/2023 6:04 PM Valerie Lee  MRN:  [id number]  Principal Problem: Major depressive disorder, single episode, severe with psychotic features (HCC) Diagnosis: Principal Problem:   Major depressive disorder, single episode, severe with psychotic features (HCC) Active Problems:   MDD (major depressive disorder), recurrent severe, without psychosis (HCC)  Reason for admission: Valerie Lee is a 48 year old African-American female with prior psychiatric history significant for MDD recurrent severe without psychotic features, schizoaffective disorder mixed type, depression, and bipolar 1 disorder.  Patient presents voluntarily to [facility] from [facility] health ED at [facility] for worsening depression resulting in suicidal ideation with overdose intent, in the context of  uninformed break-up by the fianc.   24-hour chart reviewed: Vital signs without critical values.  Patient compliant with psychotropic medication.  Agitation protocol of Benadryl  and Haldol  required last night for agitation.  As needed hydroxyzine  for agitation x 1 and trazodone  for insomnia x 1 required.  Yesterday the psychiatry team made the following recommendations: --Continue Zyprexa  disintegrating tab 5 mg p.o. at bedtime for psychosis --Continue Trileptal tablet 150 mg p.o. daily for mood stabilization --Continue gabapentin  capsule 200 mg p.o. twice daily -- Continue trazodone  50 mg p.o. q. nightly as needed for insomnia -- Continue hydroxyzine  25 mg p.o. 3 times daily as needed for anxiety.  On assessment today, the pt reports that her mood is depressed with congruent affect.  Report continues auditory hallucinations, Zyprexa  increased from 5 mg p.o. q. nightly to 15 mg p.o. q. nightly.  Less disorganized behavior observed during assessment.  Speech is clear, coherent with normal volume and pattern.  Able to participate in answering assessment questions  today.  Endorses passive SI without intent or plans.  However, able to contract for safety while in the hospital. Will continue 15 minutes safety checks per nursing staff.  Nursing staff reports sleep Hours of 4.5, trazodone  increased from 50 mg p.o. q. nightly as needed to 100 mg p.o. q. nightly.  We will monitor therapeutic effect. Reports that anxiety is rated #7/10, with 10 being high severity.  Encouraged to obtain hydroxyzine  as needed for anxiety Appetite is improving Concentration is better Energy level is adequate Endorses suicidal ideation, without suicidal intent and plan.  Able to contract for safety Denies having any HI.  Denies having psychotic symptoms.   Denies having side effects to current psychiatric medications.   We discussed changes to current medication regimen, including increasing Zyprexa  from 5 mg p.o. q. nightly to 50 mg p.o. q. nightly for psychosis and increasing trazodone  from 50 mg p.o. q. nightly to 100 mg p.o. q. nightly for insomnia.  Patient is in agreement with medication adjustments  Discussed the following psychosocial stressors: Of breaking up with her fianc.  Emotional support and active listening provided.  Total Time spent with patient: 45 minutes  Past Psychiatric History: Previous Psych Diagnoses: Major depressive disorder without psychosis, schizoaffective disorder mixed type, suicidal ideation Prior inpatient treatment: Yes x 3, last admission in 2018 at [facility] Current/prior outpatient treatment: Report being at [facility] for therapy 1-1/2 years ago Prior rehab hx: Denies Psychotherapy hx: Denies History of suicide: X 1 attempted to run in front of oncoming [place] transportation bus in 2018 History of homicide or aggression: Denies Psychiatric medication history: Patient has been managed in the past with Abilify , gabapentin , Lamictal , Latuda , and trazodone  Psychiatric medication compliance history: Reports noncompliance Neuromodulation  history: Denies Current Psychiatrist: Denies Current therapist: Denies  Past Medical History:  Past Medical History:  Diagnosis Date   Anemia    Anxiety    Asthma    rarely uses albuterol  rescue   Bipolar 1 disorder (HCC)    Depression    Paranoia (HCC)    Schizophrenia (HCC)    Schizophrenia (HCC)     Past Surgical History:  Procedure Laterality Date   ABDOMINAL HYSTERECTOMY  05/26/2011   Procedure: HYSTERECTOMY ABDOMINAL;  Surgeon: Verlyn Goad, MD;  Location: [facility];  Service: Gynecology;  Laterality: N/A;   CHOLECYSTECTOMY  2011   SHOULDER ARTHROSCOPY Right 08/13/2020   Procedure: RIGHT SHOULDER ARTHROSCOPY WITH DEBRIDEMENT AND SUBACROMIAL DECOMPRESSION;  Surgeon: Arnie Lao, MD;  Location: [facility];  Service: Orthopedics;  Laterality: Right;   TUBAL LIGATION     TUBAL LIGATION     Family History:  Family History  Problem Relation Age of Onset   Hypertension Mother    Family Psychiatric  History: See H&P Social History:  Social History   Substance and Sexual Activity  Alcohol Use No     Social History   Substance and Sexual Activity  Drug Use Yes   Types: Marijuana    Social History   Socioeconomic History   Marital status: Divorced    Spouse name: Not on file   Number of children: Not on file   Years of education: Not on file   Highest education level: Not on file  Occupational History   Not on file  Tobacco Use   Smoking status: Every Day    Current packs/day: 0.25    Average packs/day: 0.3 packs/day for 24.0 years (6.0 ttl pk-yrs)    Types: Cigarettes   Smokeless tobacco: Never  Vaping Use   Vaping status: Never Used  Substance and Sexual Activity   Alcohol use: No   Drug use: Yes    Types: Marijuana   Sexual activity: Yes    Birth control/protection: Surgical  Other Topics Concern   Not on file  Social History Narrative   Not on file   Social Drivers of Health   Financial Resource Strain: Not on file   Food Insecurity: No Food Insecurity (08/07/2023)   Hunger Vital Sign    Worried About Running Out of Food in the Last Year: Never true    Ran Out of Food in the Last Year: Never true  Transportation Needs: No Transportation Needs (08/07/2023)   PRAPARE - Administrator, Civil Service (Medical): No    Lack of Transportation (Non-Medical): No  Physical Activity: Not on file  Stress: Not on file  Social Connections: Not on file   Additional Social History:    Sleep: Fair  Appetite:  Good  Current Medications: Current Facility-Administered Medications  Medication Dose Route Frequency Provider Last Rate Last Admin   acetaminophen  (TYLENOL ) tablet 650 mg  650 mg Oral Q6H PRN Bobbitt, Shalon E, NP       alum & mag hydroxide-simeth (MAALOX/MYLANTA) 200-200-20 MG/5ML suspension 30 mL  30 mL Oral Q4H PRN Bobbitt, Shalon E, NP       haloperidol  (HALDOL ) tablet 5 mg  5 mg Oral TID PRN Bobbitt, Shalon E, NP   5 mg at 08/09/23 9563   And   diphenhydrAMINE  (BENADRYL ) capsule 50 mg  50 mg Oral TID PRN Bobbitt, Shalon E, NP   50 mg at 08/09/23 8756   haloperidol  lactate (HALDOL ) injection 5 mg  5 mg Intramuscular TID PRN Bobbitt, Shalon E, NP  And   diphenhydrAMINE  (BENADRYL ) injection 50 mg  50 mg Intramuscular TID PRN Bobbitt, Shalon E, NP       And   LORazepam  (ATIVAN ) injection 2 mg  2 mg Intramuscular TID PRN Bobbitt, Shalon E, NP       haloperidol  lactate (HALDOL ) injection 10 mg  10 mg Intramuscular TID PRN Bobbitt, Shalon E, NP       And   diphenhydrAMINE  (BENADRYL ) injection 50 mg  50 mg Intramuscular TID PRN Bobbitt, Shalon E, NP       And   LORazepam  (ATIVAN ) injection 2 mg  2 mg Intramuscular TID PRN Bobbitt, Shalon E, NP       gabapentin  (NEURONTIN ) capsule 200 mg  200 mg Oral BID Onuoha, Josephine C, NP   200 mg at 08/09/23 1736   hydrOXYzine  (ATARAX ) tablet 25 mg  25 mg Oral TID PRN Bobbitt, Shalon E, NP   25 mg at 08/08/23 2100   magnesium  hydroxide (MILK OF  MAGNESIA) suspension 30 mL  30 mL Oral Daily PRN Bobbitt, Shalon E, NP       nicotine  (NICODERM CQ  - dosed in mg/24 hours) patch 14 mg  14 mg Transdermal Daily Attiah, Nadir, MD   14 mg at 08/09/23 1324   OLANZapine  zydis (ZYPREXA ) disintegrating tablet 15 mg  15 mg Oral QHS Parker, Alvin S, MD       OXcarbazepine (TRILEPTAL) tablet 150 mg  150 mg Oral Daily Onuoha, Josephine C, NP   150 mg at 08/09/23 4010   traZODone  (DESYREL ) tablet 50 mg  50 mg Oral QHS PRN Bobbitt, Shalon E, NP   50 mg at 08/08/23 2100   Vitamin D (Ergocalciferol) (DRISDOL) 1.25 MG (50000 UNIT) capsule 50,000 Units  50,000 Units Oral Daily Parker, Alvin S, MD   50,000 Units at 08/09/23 1205    Lab Results:  Results for orders placed or performed during the hospital encounter of 08/07/23 (from the past 48 hours)  Vitamin B12     Status: None   Collection Time: 08/08/23  6:27 PM  Result Value Ref Range   Vitamin B-12 226 180 - 914 pg/mL    Comment: (NOTE) This assay is not validated for testing neonatal or myeloproliferative syndrome specimens for Vitamin B12 levels. Performed at [facility], [address], [place], [place] [address]   VITAMIN D 25 Hydroxy (Vit-D Deficiency, Fractures)     Status: Abnormal   Collection Time: 08/08/23  6:27 PM  Result Value Ref Range   Vit D, 25-Hydroxy 21.39 (L) 30 - 100 ng/mL    Comment: (NOTE) Vitamin D deficiency has been defined by the Institute of Medicine  and an Endocrine Society practice guideline as a level of serum 25-OH  vitamin D less than 20 ng/mL (1,2). The Endocrine Society went on to  further define vitamin D insufficiency as a level between 21 and 29  ng/mL (2).  1. IOM (Institute of Medicine). 2010. Dietary reference intakes for  calcium and D. [place]: [employer]. 2. Holick MF, Binkley Bacon, Bischoff-Ferrari HA, et al. Evaluation,  treatment, and prevention of vitamin D deficiency: an Endocrine  Society clinical  practice guideline, JCEM. 2011 Jul; 96(7): 1911-30.  Performed at [facility], [address] [place], [place] [address]   Hemoglobin A1c     Status: None   Collection Time: 08/08/23  6:27 PM  Result Value Ref Range   Hgb A1c MFr Bld 4.8 4.8 - 5.6 %  Comment: (NOTE)         Prediabetes: 5.7 - 6.4         Diabetes: >6.4         Glycemic control for adults with diabetes: <7.0    Mean Plasma Glucose 91 mg/dL    Comment: (NOTE) Performed At: [facility]  [address] [place], [place] [id number] Pearlean Botts MD NW:[id number]   Basic metabolic panel     Status: Abnormal   Collection Time: 08/08/23  6:27 PM  Result Value Ref Range   Sodium 137 135 - 145 mmol/L   Potassium 3.8 3.5 - 5.1 mmol/L   Chloride 104 98 - 111 mmol/L   CO2 24 22 - 32 mmol/L   Glucose, Bld 125 (H) 70 - 99 mg/dL    Comment: Glucose reference range applies only to samples taken after fasting for at least 8 hours.   BUN 15 6 - 20 mg/dL   Creatinine, Ser 5.78 0.44 - 1.00 mg/dL   Calcium 9.4 8.9 - 46.9 mg/dL   GFR, Estimated >62 >95 mL/min    Comment: (NOTE) Calculated using the CKD-EPI Creatinine Equation (2021)    Anion gap 9 5 - 15    Comment: Performed at [facility], [address], [place], [place] [address]  Lipid panel     Status: Abnormal   Collection Time: 08/08/23  6:27 PM  Result Value Ref Range   Cholesterol 188 0 - 200 mg/dL   Triglycerides 244 <010 mg/dL   HDL 45 >27 mg/dL   Total CHOL/HDL Ratio 4.2 RATIO   VLDL 25 0 - 40 mg/dL   LDL Cholesterol 253 (H) 0 - 99 mg/dL    Comment:        Total Cholesterol/HDL:CHD Risk Coronary Heart Disease Risk Table                     Men   Women  1/2 Average Risk   3.4   3.3  Average Risk       5.0   4.4  2 X Average Risk   9.6   7.1  3 X Average Risk  23.4   11.0        Use the calculated Patient Ratio above and the CHD Risk Table to determine the patient's CHD Risk.        ATP III CLASSIFICATION (LDL):   <100     mg/dL   Optimal  664-403  mg/dL   Near or Above                    Optimal  130-159  mg/dL   Borderline  474-259  mg/dL   High  >563     mg/dL   Very High Performed at [facility], [address] [place], [place] [address]   TSH     Status: None   Collection Time: 08/08/23  6:27 PM  Result Value Ref Range   TSH 0.609 0.350 - 4.500 uIU/mL    Comment: Performed by a 3rd Generation assay with a functional sensitivity of <=0.01 uIU/mL. Performed at [facility], [address] [place], [place] [address]    Blood Alcohol level:  Lab Results  Component Value Date   [facility] <15 08/05/2023   ETH <5 10/04/2016   Metabolic Disorder Labs: Lab Results  Component Value Date   HGBA1C 4.8 08/08/2023   MPG 91 08/08/2023   MPG 88 10/06/2016  Lab Results  Component Value Date   PROLACTIN 68.7 (H) 11/26/2014   Lab Results  Component Value Date   CHOL 188 08/08/2023   TRIG 127 08/08/2023   HDL 45 08/08/2023   CHOLHDL 4.2 08/08/2023   VLDL 25 08/08/2023   LDLCALC 118 (H) 08/08/2023   LDLCALC 126 (H) 10/06/2016   Physical Findings: AIMS:  , ,  ,  ,    CIWA:    COWS:     Musculoskeletal: Strength & Muscle Tone: within normal limits Gait & Station: normal Patient leans: N/A  Psychiatric Specialty Exam:  Presentation  General Appearance:  Casual; Fairly Groomed  Eye Contact: Good  Speech: Clear and Coherent; Normal Rate  Speech Volume: Normal  Handedness: Right  Mood and Affect  Mood: Depressed; Anxious  Affect: Congruent  Thought Process  Thought Processes: Coherent; Disorganized  Descriptions of Associations:Intact  Orientation:Full (Time, Place and Person)  Thought Content:Perseveration  History of Schizophrenia/Schizoaffective disorder:No data recorded Duration of Psychotic Symptoms:No data recorded Hallucinations:Hallucinations: Auditory Description of Command Hallucinations: Auditory hallucination  telling her negative stuff Description of Auditory Hallucinations: Hearing negative voices  Ideas of Reference:None  Suicidal Thoughts:Suicidal Thoughts: Yes, Passive SI Passive Intent and/or Plan: Without Intent; Without Plan; Without Means to Carry Out  Homicidal Thoughts:Homicidal Thoughts: No  Sensorium  Memory: Immediate Fair; Recent Fair  Judgment: Poor  Insight: Fair  Chartered certified accountant: Fair  Attention Span: Fair  Recall: [employer] of Knowledge: Fair  Language: Fair  Psychomotor Activity  Psychomotor Activity: Psychomotor Activity: Normal  Assets  Assets: Manufacturing systems engineer; Physical Health; Resilience  Sleep  Sleep: Sleep: Fair Number of Hours of Sleep: 4.5  Physical Exam: Physical Exam Vitals and nursing note reviewed.  Constitutional:      General: She is not in acute distress.    Appearance: She is not toxic-appearing.  HENT:     Head: Normocephalic.     Right Ear: External ear normal.     Left Ear: External ear normal.     Nose: Nose normal.     Mouth/Throat:     Mouth: Mucous membranes are moist.  Eyes:     Extraocular Movements: Extraocular movements intact.  Cardiovascular:     Rate and Rhythm: Normal rate.     Pulses: Normal pulses.  Pulmonary:     Effort: Pulmonary effort is normal.  Abdominal:     Comments: Deferred  Genitourinary:    Comments: Deferred  Musculoskeletal:        General: Normal range of motion.     Cervical back: Normal range of motion.  Skin:    General: Skin is warm.  Neurological:     General: No focal deficit present.     Mental Status: She is alert and oriented to person, place, and time.  Psychiatric:        Mood and Affect: Mood normal.     Comments: Mood and behavior improving    Review of Systems  Constitutional:  Negative for chills and fever.  HENT:  Negative for sore throat.   Eyes:  Negative for blurred vision.  Respiratory:  Negative for cough, sputum  production, shortness of breath and wheezing.   Cardiovascular:  Negative for chest pain and palpitations.  Gastrointestinal:  Negative for heartburn, nausea and vomiting.  Genitourinary:  Negative for dysuria.  Musculoskeletal:  Negative for myalgias.  Skin:  Negative for itching and rash.  Neurological:  Negative for dizziness and headaches.  Endo/Heme/Allergies:  See allergy listing  Psychiatric/Behavioral:  Positive for depression, hallucinations and suicidal ideas. Negative for substance abuse. The patient is nervous/anxious and has insomnia.    Blood pressure (!) 112/52, pulse 68, temperature 98.7 F (37.1 C), temperature source Oral, resp. rate 16, height 5' (1.524 m), weight 59.3 kg, last menstrual period 04/12/2011, SpO2 100%. Body mass index is 25.55 kg/m.  Treatment Plan Summary: Daily contact with patient to assess and evaluate symptoms and progress in treatment and Medication management Physician Treatment Plan for Primary Diagnosis: MDD (major depressive disorder), recurrent severe, without psychosis (HCC)   Long Term Goal(s): Improvement in symptoms so as ready for discharge   Short Term Goals: Ability to identify changes in lifestyle to reduce recurrence of condition will improve, Ability to verbalize feelings will improve, Ability to disclose and discuss suicidal ideas, Ability to demonstrate self-control will improve, Ability to identify and develop effective coping behaviors will improve, Ability to maintain clinical measurements within normal limits will improve, Compliance with prescribed medications will improve, and Ability to identify triggers associated with substance abuse/mental health issues will improve   Physician Treatment Plan for Secondary Diagnosis:  Assessment: Valerie Lee is a 48 year old African-American female with prior psychiatric history significant for MDD recurrent severe without psychotic features, schizoaffective disorder mixed type,  depression, and bipolar 1 disorder.  [facility] from [facility] ED at [facility] for worsening depression resulting in suicidal ideation with overdose intent in the context of  uninformed break-up by the fianc. Principal Problem:   MDD (major depressive disorder), recurrent severe, without psychosis (HCC)   Plans: Medications: -- Increase Zyprexa  disintegrating tab from 5 mg to 15 mg p.o. at bedtime for psychosis --Continue Trileptal tablet 150 mg p.o. daily for mood stabilization --Continue gabapentin  capsule 200 mg p.o. twice daily -- Increase trazodone  tablet from 50 mg to 100 mg daily p.o. q. nightly as needed for insomnia -- Continue hydroxyzine  25 mg p.o. 3 times daily as needed for anxiety.   Other PRN Medications  -Acetaminophen  650 mg every 6 as needed/mild pain  -Maalox 30 mL oral every 4 as needed/digestion  -Magnesium  hydroxide 30 mL daily as needed/mild constipation    --The risks/benefits/side-effects/alternatives to this medication were discussed in detail with the patient and time was given for questions. The patient consents to medication trial.     -- Metabolic profile and EKG monitoring obtained while on an atypical antipsychotic (BMI: Lipid Panel: HbgA1c: QTc:)   -- Encouraged patient to participate in unit milieu and in scheduled group therapies    Admission labs reviewed: CMP: Potassium level 3.3 replaced at the hospital, BMP ordered.  Otherwise normal.  CBC with differential: WBC 13.3 elevated, RBC 3.77 low, MCV 100.3 elevated, neutrophils 8.8 elevated, otherwise normal.  UDS positive for marijuana.   New labs ordered: BMP, hemoglobin A1c, vitamin D 25-hydroxy, vitamin B12, lipid panel, TSH.   EKG reviewed: Sinus bradycardia, ventricular rate 55, QT/QTc 404/386.   Continue BH Agitation Protocol  --Haldol  5 mg, oral, 3 times daily as needed, mild agitation  --Benadryl  50 mg, oral, 3 times daily as needed,  mild agitation                                      OR   --Haldol  injection 5 mg, IM, 3 times daily as needed, moderate agitation  --Benadryl  injection 50 mg, IM, 3  times daily as needed, moderate agitation  --Ativan  injection 2 mg, IM, 3 times daily as needed, moderate agitation                                     OR  --Haldol  injection 10 mg, IM, 3 times daily as needed, severe agitation  --Benadryl  injection 50 mg, IM, 3 times daily as needed, severe agitation  --Ativan  injection 2 mg, IM, 3 times daily as needed, severe agitation    Safety and Monitoring:  Voluntary admission to inpatient psychiatric unit for safety, stabilization and treatment  Daily contact with patient to assess and evaluate symptoms and progress in treatment  Patient's case to be discussed in multi-disciplinary team meeting  Observation Level : q15 minute checks  Vital signs: q12 hours  Precautions: suicide, but pt currently verbally contracts for safety on unit?    Discharge Planning:  Social work and case management to assist with discharge planning and identification of hospital follow-up needs prior to discharge  Estimated LOS: 5-7?days    Discharge Concerns: Need to establish a safety plan; Medication compliance and effectiveness  Discharge Goals: Return home with outpatient referrals for mental health follow-up including medication management/psychotherapy.    Laurence Pons, FNP 08/09/2023, 6:04 PM

## 2023-08-09 NOTE — Progress Notes (Signed)
   08/09/23 2300  Psych Admission Type (Psych Patients Only)  Admission Status Voluntary  Psychosocial Assessment  Patient Complaints None  Eye Contact Fair  Facial Expression Flat  Affect Appropriate to circumstance  Speech Logical/coherent  Interaction Assertive  Motor Activity Slow  Appearance/Hygiene Unremarkable  Behavior Characteristics Cooperative  Mood Pleasant  Thought Process  Coherency WDL  Content WDL  Delusions None reported or observed  Perception WDL  Hallucination None reported or observed  Judgment Poor  Confusion WDL  Danger to Self  Current suicidal ideation? Denies  Agreement Not to Harm Self Yes  Description of Agreement verbal  Danger to Others  Danger to Others None reported or observed

## 2023-08-09 NOTE — BH IP Treatment Plan (Signed)
 Interdisciplinary Treatment and Diagnostic Plan Update  08/09/2023 Time of Session: 1100AM Valerie Lee MRN: [id number]  Principal Diagnosis: Major depressive disorder, single episode, severe with psychotic features [facility])  Secondary Diagnoses: Principal Problem:   Major depressive disorder, single episode, severe with psychotic features (HCC) Active Problems:   MDD (major depressive disorder), recurrent severe, without psychosis (HCC)   Current Medications:  Current Facility-Administered Medications  Medication Dose Route Frequency Provider Last Rate Last Admin   acetaminophen  (TYLENOL ) tablet 650 mg  650 mg Oral Q6H PRN Bobbitt, Shalon E, NP       alum & mag hydroxide-simeth (MAALOX/MYLANTA) 200-200-20 MG/5ML suspension 30 mL  30 mL Oral Q4H PRN Bobbitt, Shalon E, NP       haloperidol  (HALDOL ) tablet 5 mg  5 mg Oral TID PRN Bobbitt, Shalon E, NP   5 mg at 08/09/23 9562   And   diphenhydrAMINE  (BENADRYL ) capsule 50 mg  50 mg Oral TID PRN Bobbitt, Shalon E, NP   50 mg at 08/09/23 1308   haloperidol  lactate (HALDOL ) injection 5 mg  5 mg Intramuscular TID PRN Bobbitt, Shalon E, NP       And   diphenhydrAMINE  (BENADRYL ) injection 50 mg  50 mg Intramuscular TID PRN Bobbitt, Shalon E, NP       And   LORazepam  (ATIVAN ) injection 2 mg  2 mg Intramuscular TID PRN Bobbitt, Shalon E, NP       haloperidol  lactate (HALDOL ) injection 10 mg  10 mg Intramuscular TID PRN Bobbitt, Shalon E, NP       And   diphenhydrAMINE  (BENADRYL ) injection 50 mg  50 mg Intramuscular TID PRN Bobbitt, Shalon E, NP       And   LORazepam  (ATIVAN ) injection 2 mg  2 mg Intramuscular TID PRN Bobbitt, Shalon E, NP       gabapentin  (NEURONTIN ) capsule 200 mg  200 mg Oral BID Onuoha, Josephine C, NP   200 mg at 08/09/23 6578   hydrOXYzine  (ATARAX ) tablet 25 mg  25 mg Oral TID PRN Bobbitt, Shalon E, NP   25 mg at 08/08/23 2100   magnesium  hydroxide (MILK OF MAGNESIA) suspension 30 mL  30 mL Oral Daily PRN Bobbitt,  Shalon E, NP       nicotine  (NICODERM CQ  - dosed in mg/24 hours) patch 14 mg  14 mg Transdermal Daily Attiah, Nadir, MD   14 mg at 08/09/23 4696   OLANZapine  zydis (ZYPREXA ) disintegrating tablet 15 mg  15 mg Oral QHS Parker, Alvin S, MD       OXcarbazepine (TRILEPTAL) tablet 150 mg  150 mg Oral Daily Onuoha, Josephine C, NP   150 mg at 08/09/23 2952   traZODone  (DESYREL ) tablet 50 mg  50 mg Oral QHS PRN Bobbitt, Shalon E, NP   50 mg at 08/08/23 2100   Vitamin D (Ergocalciferol) (DRISDOL) 1.25 MG (50000 UNIT) capsule 50,000 Units  50,000 Units Oral Daily Parker, Alvin S, MD   50,000 Units at 08/09/23 1205   PTA Medications: Medications Prior to Admission  Medication Sig Dispense Refill Last Dose/Taking   ARIPiprazole  (ABILIFY ) 10 MG tablet Take 1 tablet (10 mg total) by mouth at bedtime. For mood stabilization (Patient not taking: Reported on 08/05/2023) 30 tablet 0    benzonatate  (TESSALON ) 100 MG capsule Take 1 capsule (100 mg total) by mouth 3 (three) times daily as needed for cough. (Patient not taking: Reported on 01/12/2023) 21 capsule 0    ferrous sulfate 325 (65 FE) MG  tablet Take 325 mg by mouth daily with breakfast.      furosemide (LASIX) 20 MG tablet Take 20 mg by mouth daily as needed for edema.      gabapentin  (NEURONTIN ) 100 MG capsule Take 100 mg by mouth at bedtime.      gabapentin  (NEURONTIN ) 400 MG capsule Take 1 capsule (400 mg total) by mouth 2 (two) times daily for 14 days. (Patient not taking: Reported on 08/05/2023) 28 capsule 0    lamoTRIgine  (LAMICTAL ) 25 MG tablet Take 1 tablet (25 mg total) by mouth daily. For mood stabilization (Patient not taking: Reported on 01/12/2023) 30 tablet 0    lidocaine  (XYLOCAINE ) 2 % solution Use as directed 15 mLs in the mouth or throat as needed for mouth pain. (Patient not taking: Reported on 08/05/2023) 100 mL 0    lurasidone  (LATUDA ) 40 MG TABS tablet Take 40 mg by mouth daily with breakfast.      metoCLOPramide  (REGLAN ) 10 MG tablet Take  1 tablet (10 mg total) by mouth every 6 (six) hours as needed for nausea (nausea/headache). 10 tablet 0    naproxen  (NAPROSYN ) 500 MG tablet Take 1 tablet (500 mg total) by mouth 2 (two) times daily. (Patient not taking: Reported on 08/05/2023) 30 tablet 0    ondansetron  (ZOFRAN -ODT) 4 MG disintegrating tablet Take 1 tablet (4 mg total) by mouth every 8 (eight) hours as needed for nausea or vomiting. (Patient not taking: Reported on 08/05/2023) 20 tablet 0    tiZANidine  (ZANAFLEX ) 4 MG tablet Take 1 tablet (4 mg total) by mouth every 8 (eight) hours as needed for muscle spasms. 30 tablet 0    traZODone  (DESYREL ) 50 MG tablet Take 1 tablet (50 mg total) by mouth at bedtime as needed for sleep. (Patient not taking: Reported on 08/05/2023) 30 tablet 0     Patient Stressors: Marital or family conflict   Substance abuse    Patient Strengths: Ability for insight   Treatment Modalities: Medication Management, Group therapy, Case management,  1 to 1 session with clinician, Psychoeducation, Recreational therapy.   Physician Treatment Plan for Primary Diagnosis: Major depressive disorder, single episode, severe with psychotic features (HCC) Long Term Goal(s): Improvement in symptoms so as ready for discharge   Short Term Goals: Ability to identify changes in lifestyle to reduce recurrence of condition will improve Ability to verbalize feelings will improve Ability to disclose and discuss suicidal ideas Ability to demonstrate self-control will improve Ability to identify and develop effective coping behaviors will improve Ability to maintain clinical measurements within normal limits will improve Compliance with prescribed medications will improve Ability to identify triggers associated with substance abuse/mental health issues will improve  Medication Management: Evaluate patient's response, side effects, and tolerance of medication regimen.  Therapeutic Interventions: 1 to 1 sessions, Unit Group  sessions and Medication administration.  Evaluation of Outcomes: Not Progressing  Physician Treatment Plan for Secondary Diagnosis: Principal Problem:   Major depressive disorder, single episode, severe with psychotic features (HCC) Active Problems:   MDD (major depressive disorder), recurrent severe, without psychosis (HCC)  Long Term Goal(s): Improvement in symptoms so as ready for discharge   Short Term Goals: Ability to identify changes in lifestyle to reduce recurrence of condition will improve Ability to verbalize feelings will improve Ability to disclose and discuss suicidal ideas Ability to demonstrate self-control will improve Ability to identify and develop effective coping behaviors will improve Ability to maintain clinical measurements within normal limits will improve Compliance with prescribed medications will  improve Ability to identify triggers associated with substance abuse/mental health issues will improve     Medication Management: Evaluate patient's response, side effects, and tolerance of medication regimen.  Therapeutic Interventions: 1 to 1 sessions, Unit Group sessions and Medication administration.  Evaluation of Outcomes: Not Progressing   RN Treatment Plan for Primary Diagnosis: Major depressive disorder, single episode, severe with psychotic features (HCC) Long Term Goal(s): Knowledge of disease and therapeutic regimen to maintain health will improve  Short Term Goals: Ability to remain free from injury will improve, Ability to verbalize frustration and anger appropriately will improve, Ability to demonstrate self-control, Ability to participate in decision making will improve, Ability to verbalize feelings will improve, Ability to disclose and discuss suicidal ideas, Ability to identify and develop effective coping behaviors will improve, and Compliance with prescribed medications will improve  Medication Management: RN will administer medications as  ordered by provider, will assess and evaluate patient's response and provide education to patient for prescribed medication. RN will report any adverse and/or side effects to prescribing provider.  Therapeutic Interventions: 1 on 1 counseling sessions, Psychoeducation, Medication administration, Evaluate responses to treatment, Monitor vital signs and CBGs as ordered, Perform/monitor CIWA, COWS, AIMS and Fall Risk screenings as ordered, Perform wound care treatments as ordered.  Evaluation of Outcomes: Not Progressing   LCSW Treatment Plan for Primary Diagnosis: Major depressive disorder, single episode, severe with psychotic features (HCC) Long Term Goal(s): Safe transition to appropriate next level of care at discharge, Engage patient in therapeutic group addressing interpersonal concerns.  Short Term Goals: Engage patient in aftercare planning with referrals and resources, Increase social support, Increase ability to appropriately verbalize feelings, Increase emotional regulation, Facilitate acceptance of mental health diagnosis and concerns, Facilitate patient progression through stages of change regarding substance use diagnoses and concerns, Identify triggers associated with mental health/substance abuse issues, and Increase skills for wellness and recovery  Therapeutic Interventions: Assess for all discharge needs, 1 to 1 time with Social worker, Explore available resources and support systems, Assess for adequacy in community support network, Educate family and significant other(s) on suicide prevention, Complete Psychosocial Assessment, Interpersonal group therapy.  Evaluation of Outcomes: Not Progressing   Progress in Treatment: Attending groups: Yes. Participating in groups: Yes. Taking medication as prescribed: Yes. Toleration medication: Yes. Family/Significant other contact made: Yes, individual(s) contacted:  Janee Mech (daughter) ONLY SPE, no collateral - [phone] Patient  understands diagnosis: Yes. Discussing patient identified problems/goals with staff: Yes. Medical problems stabilized or resolved: Yes. Denies suicidal/homicidal ideation: Yes. Issues/concerns per patient self-inventory: No.  New problem(s) identified: No, Describe:  none  New Short Term/Long Term Goal(s): medication stabilization, elimination of SI thoughts, development of comprehensive mental wellness plan.    Patient Goals:  "Lessen my mood swings, get on medication for the voices, get my soul back and not hurt myself'  Discharge Plan or Barriers: Patient recently admitted. CSW will continue to follow and assess for appropriate referrals and possible discharge planning.    Reason for Continuation of Hospitalization: Depression Hallucinations Medication stabilization Suicidal ideation  Estimated Length of Stay: 5-7 days  Last 3 [place] Suicide Severity Risk Score: Flowsheet Row [facility] (Current[facility]05/2023 in [facility] from 01/12/2023 in [facility] at [facility] RISK CATEGORY High Risk High Risk No Risk       Last PHQ 2/9 Scores:     No data to display  Scribe for Treatment Team: Vonzell Guerin, Milinda Allen 08/09/2023 1:41 PM

## 2023-08-09 NOTE — [facility] Group Notes (Signed)
Patient attended the NA group. ?

## 2023-08-09 NOTE — Group Note (Signed)
 Date:  08/09/2023 Time:  1:56 PM  Group Topic/Focus:  Emotional Education:   The focus of this group is to discuss unhealthy thought patterns and how they impact mental health.     Participation Level:  Active  Participation Quality:  Appropriate  Affect:  Appropriate  Cognitive:  Appropriate  Insight: Appropriate  Engagement in Group:  Engaged  Modes of Intervention:  Discussion and Education  Additional Comments:    Sheryl Donna 08/09/2023, 1:56 PM

## 2023-08-09 NOTE — Plan of Care (Signed)
  Problem: Education: Goal: Mental status will improve Outcome: Progressing   Problem: Education: Goal: Mental status will improve Outcome: Progressing   Problem: Activity: Goal: Interest or engagement in activities will improve Outcome: Progressing   Problem: Coping: Goal: Ability to verbalize frustrations and anger appropriately will improve Outcome: Progressing   Problem: Safety: Goal: Periods of time without injury will increase Outcome: Progressing

## 2023-08-09 NOTE — [facility] Suicide Risk Assessment (Signed)
 [facility] INPATIENT:  Family/Significant Other Suicide Prevention Education  Suicide Prevention Education:  Education Completed; Cleveland Dales (younger daughter) [phone],  (name of family member/significant other) has been identified by the patient as the family member/significant other with whom the patient will be residing, and identified as the person(s) who will aid the patient in the event of a mental health crisis (suicidal ideations/suicide attempt).  With written consent from the patient, the family member/significant other has been provided the following suicide prevention education, prior to the and/or following the discharge of the patient.  Daughter said that they don't have any guns or weapons.  She said she will secure medications and sharp objects (such as knives or scissors).  She didn't have any concerns about patient returning home.    The following people live in the home:  Cleveland Dales (younger daughter), patient and patient's older daughter.  Daughter said that she spoke with patient for 20 minutes today.  Patient sounds "like she is a lot better."  Daughter said that she and her sister work overnight.  Patient may take [employer] to come home upon discharge.  She said either she or her older sister would be home.    The suicide prevention education provided includes the following: Suicide risk factors Suicide prevention and interventions National Suicide Hotline telephone number [facility] assessment telephone number [facility] Emergency Assistance 911 [facility] and/or Residential Mobile Crisis Unit telephone number  Request made of family/significant other to: Remove weapons (e.g., guns, rifles, knives), all items previously/currently identified as safety concern.   Remove drugs/medications (over-the-counter, prescriptions, illicit drugs), all items previously/currently identified as a safety concern.  The family member/significant other verbalizes understanding of the  suicide prevention education information provided.  The family member/significant other agrees to remove the items of safety concern listed above.  Helia Haese O Clinton Dragone, LCSWA 08/09/2023, 1:30 PM

## 2023-08-09 NOTE — Group Note (Signed)
 Recreation Therapy Group Note   Group Topic:Communication  Group Date: 08/09/2023 Start Time: 0933 End Time: 1013 Facilitators: Giuliano Preece-McCall, LRT,CTRS Location: [address] Dayroom   Group Topic: Communication, Problem Solving   Goal Area(s) Addresses:  Patient will effectively listen to complete activity.  Patient will identify communication skills used to make activity successful.  Patient will identify how skills used during activity can be used to reach post d/c goals.    Behavioral Response: Attentive   Intervention: Building surveyor Activity - Geometric pattern cards, pencils, blank paper    Activity: Geometric Drawings.  Three volunteers from the peer group will be shown an abstract picture with a particular arrangement of geometrical shapes.  Each round, one 'speaker' will describe the pattern, as accurately as possible without revealing the image to the group.  The remaining group members will listen and draw the picture to reflect how it is described to them. Patients with the role of 'listener' cannot ask clarifying questions but, may request that the speaker repeat a direction. Once the drawings are complete, the presenter will show the rest of the group the picture and compare how close each person came to drawing the picture. LRT will facilitate a post-activity discussion regarding effective communication and the importance of planning, listening, and asking for clarification in daily interactions with others.  Education: Environmental consultant, Active listening, Support systems, Discharge planning  Education Outcome: Acknowledges understanding/In group clarification offered/Needs additional education.    Affect/Mood: Appropriate   Participation Level: Minimal   Participation Quality: Independent   Behavior: Attentive    Speech/Thought Process: Relevant   Insight: Moderate   Judgement: Moderate   Modes of Intervention: Activity   Patient Response  to Interventions:  Receptive   Education Outcome:  In group clarification offered    Clinical Observations/Individualized Feedback: Pt came in near the end of group. Pt attempted to complete one of the drawings but stated she didn't understand so she observed for the remainder of group.    Plan: Continue to engage patient in RT group sessions 2-3x/week.   Tashica Provencio-McCall, LRT,CTRS 08/09/2023 1:01 PM

## 2023-08-09 NOTE — Plan of Care (Signed)
  Problem: Education: Goal: Emotional status will improve Outcome: Progressing Goal: Verbalization of understanding the information provided will improve Outcome: Progressing   Problem: Coping: Goal: Ability to verbalize frustrations and anger appropriately will improve Outcome: Progressing Goal: Ability to demonstrate self-control will improve Outcome: Progressing   

## 2023-08-09 NOTE — Progress Notes (Signed)
   08/09/23 0900  Psych Admission Type (Psych Patients Only)  Admission Status Voluntary  Psychosocial Assessment  Patient Complaints Anxiety;Depression;Self-harm thoughts;Other (Comment) (auditory hallucinations)  Eye Contact Fair  Facial Expression Anxious  Affect Depressed  Speech Logical/coherent  Interaction Assertive  Motor Activity Slow  Appearance/Hygiene Unremarkable  Behavior Characteristics Cooperative  Mood Anxious;Depressed  Thought Process  Coherency WDL  Content WDL  Delusions None reported or observed  Perception Hallucinations  Hallucination Auditory;Visual  Judgment Impaired  Confusion WDL  Danger to Self  Current suicidal ideation? Passive  Self-Injurious Behavior Some self-injurious ideation observed or expressed.  No lethal plan expressed   Agreement Not to Harm Self Yes  Description of Agreement verbal  Danger to Others  Danger to Others None reported or observed

## 2023-08-09 NOTE — Group Note (Signed)
 Date:  08/09/2023 Time:  9:11 AM  Group Topic/Focus:  Goals Group:   The focus of this group is to help patients establish daily goals to achieve during treatment and discuss how the patient can incorporate goal setting into their daily lives to aide in recovery. Orientation:   The focus of this group is to educate the patient on the purpose and policies of crisis stabilization and provide a format to answer questions about their admission.  The group details unit policies and expectations of patients while admitted.    Participation Level:  Active  Participation Quality:  Appropriate  Affect:  Appropriate  Cognitive:  Appropriate  Insight: Appropriate  Engagement in Group:  Engaged  Modes of Intervention:  Discussion and Orientation  Additional Comments:    Shardae Kleinman D Akyia Borelli 08/09/2023, 9:11 AM

## 2023-08-09 NOTE — [facility] Group Notes (Signed)
 Spirituality Group   Focus of discussion: Gratitude and Strength Awareness   Process: Following theoretical framework of group therapy of Irvin Yalom and further informed by Rogerian and Relational Cultural Theory approaches, participants invited to name:   Sources of gratitude (internal>external)   Articulate gratitude for self   Name a personal strength/gift/skill   Locate points of resonance among group members/engage the "here and now"   Conclude with grounding/breathwork  Observations: Rashundra was present for the second half of group. She was an active participant in the group discussion.  Jowana Thumma L. Minetta Aly, M.Div [phone]

## 2023-08-10 NOTE — Progress Notes (Signed)
   08/10/23 1127  Psych Admission Type (Psych Patients Only)  Admission Status Voluntary  Psychosocial Assessment  Patient Complaints Anxiety;Depression;Self-harm thoughts  Eye Contact Fair  Facial Expression Flat  Affect Anxious  Speech Logical/coherent  Interaction Assertive  Motor Activity Slow  Appearance/Hygiene Unremarkable  Behavior Characteristics Cooperative;Calm  Mood Anxious;Pleasant  Thought Process  Coherency WDL  Content WDL  Delusions None reported or observed  Perception Hallucinations  Hallucination Auditory (Pt reports AH tellling her to harm herself)  Judgment Impaired  Danger to Self  Current suicidal ideation? Passive  Self-Injurious Behavior Some self-injurious ideation observed or expressed.  No lethal plan expressed   Agreement Not to Harm Self Yes  Description of Agreement Verbal  Danger to Others  Danger to Others None reported or observed

## 2023-08-10 NOTE — Plan of Care (Signed)
   Problem: Education: Goal: Emotional status will improve Outcome: Progressing Goal: Mental status will improve Outcome: Progressing   Problem: Activity: Goal: Interest or engagement in activities will improve Outcome: Progressing Goal: Sleeping patterns will improve Outcome: Progressing   Problem: Safety: Goal: Periods of time without injury will increase Outcome: Progressing

## 2023-08-10 NOTE — Plan of Care (Signed)
  Problem: Education: Goal: Emotional status will improve Outcome: Progressing Goal: Mental status will improve Outcome: Progressing Goal: Verbalization of understanding the information provided will improve Outcome: Progressing   Problem: Safety: Goal: Periods of time without injury will increase Outcome: Progressing   

## 2023-08-10 NOTE — Group Note (Signed)
 LCSW Group Therapy Note   Group Date: 08/10/2023 Start Time: 1100 End Time: 1200  Participation:  patient was present and actively participated in the conversation.  She shared personal experiences.  Type of Therapy:  Group Therapy  Topic:  Understanding your Path to Change   Objective:  The goal is to help individuals understand the stages of change, identify where they currently are in the process, and provide actionable next steps to continue moving forward in their journey of change.  Goals: - Learn about the six stages of change:  Precontemplation, Contemplation, Preparation, Action, Maintenance, and Relapse - Reflect on Current Change Efforts:  Recognize which stage participants are in regarding a personal change. -  Plan Next Steps for Moving Forward:  Create an action plan based on their current stage of change.  Class Summary:  In this session, we explored the Stages of Change as a framework to understand the process of change.  We discussed how each stage helps individuals recognize where they are in their personal journey and used the Stages of Change Worksheet for self-reflection. Participants answered questions to better understand their current stage, challenges, and progress. We also emphasized the importance of moving forward, even if setbacks (Relapse) occur, and created actionable steps to help participants continue progressing. By the end of the session, participants gained a clearer understanding of their path to change and left with a clear plan for next steps.  Modalities:  Elements of CBT (cognitive restructuring, problem solving)  Element of DBT (mindfulness, distress tolerance)   Marylu Dudenhoeffer O Kordelia Severin, LCSWA 08/10/2023  3:42 PM

## 2023-08-10 NOTE — Progress Notes (Signed)
 [facility] MD Progress Note  08/10/2023 3:12 PM Valerie Lee  MRN:  [id number]  Principal Problem: Major depressive disorder, single episode, severe with psychotic features (HCC) Diagnosis: Principal Problem:   Major depressive disorder, single episode, severe with psychotic features (HCC) Active Problems:   MDD (major depressive disorder), recurrent severe, without psychosis (HCC)  Reason for admission: Valerie Lee is a 48 year old African-American female with prior psychiatric history significant for MDD recurrent severe without psychotic features, schizoaffective disorder mixed type, depression, and bipolar 1 disorder.  Patient presents voluntarily to [facility] from [facility] health ED at [facility] for worsening depression resulting in suicidal ideation with overdose intent, in the context of  uninformed break-up by the fianc.   24-hour chart reviewed: Vital signs without critical values.  Patient compliant with psychotropic medication.  Agitation protocol of Benadryl  and Haldol  required last night for agitation.  As needed hydroxyzine  for agitation x 1 required.  Yesterday the psychiatry team made the following recommendations: -- Increase Zyprexa  disintegrating tab from 5 mg to 15 mg p.o. at bedtime for psychosis --Continue Trileptal tablet 150 mg p.o. daily for mood stabilization --Continue gabapentin  capsule 200 mg p.o. twice daily -- Increase trazodone  tablet from 50 mg to 100 mg daily p.o. q. nightly as needed for insomnia -- Continue hydroxyzine  25 mg p.o. 3 times daily as needed for anxiety.  Today's Assessment Notes: Patient presents alert, calm, more organized and oriented to person, place, time and situation during assessment today. She reports that her mood is less depressed with congruent affect. Rates depression as #6/10, with 10 being high severity. Report continues auditory hallucinations, however, voices are now moderate rather  than loud. Continues on  Zyprexa  increased 15 mg p.o. q. nightly.  Less disorganized behavior observed during assessment.  Speech is clear, coherent with normal volume and pattern.  Able to participate in answering assessment questions today.  Endorses passive SI without intent or plans.  However, able to contract for safety while in the hospital. Will continue 15 minutes safety checks per nursing staff.  Nursing staff reports sleep Hours of 7.5 with trazodone  100 mg p.o. q. nightly. Reports that anxiety is rated #6/10, with 10 being high severity.  Encouraged to obtain hydroxyzine  as needed for anxiety  Appetite is improving Concentration is better Energy level is adequate Endorses suicidal ideation, without suicidal intent and plan.  Able to contract for safety Denies having any HI.  Denies having psychotic symptoms.   Denies having side effects to current psychiatric medications.   We discussed changes to current medication regimen, including increasing Zyprexa  from 5 mg p.o. q. nightly to 50 mg p.o. q. nightly for psychosis and increasing trazodone  from 50 mg p.o. q. nightly to 100 mg p.o. q. nightly for insomnia.  Patient is in agreement with medication adjustments  Discussed the following psychosocial stressors: Of breaking up with her fianc.  Emotional support and active listening provided.  Total Time spent with patient: 45 minutes  Past Psychiatric History: Previous Psych Diagnoses: Major depressive disorder without psychosis, schizoaffective disorder mixed type, suicidal ideation Prior inpatient treatment: Yes x 3, last admission in 2018 at [facility] Current/prior outpatient treatment: Report being at [facility] for therapy 1-1/2 years ago Prior rehab hx: Denies Psychotherapy hx: Denies History of suicide: X 1 attempted to run in front of oncoming [place] transportation bus in 2018 History of homicide or aggression: Denies Psychiatric medication history: Patient has been managed  in the past with Abilify ,  gabapentin , Lamictal , Latuda , and trazodone  Psychiatric medication compliance history: Reports noncompliance Neuromodulation history: Denies Current Psychiatrist: Denies Current therapist: Denies  Past Medical History:  Past Medical History:  Diagnosis Date   Anemia    Anxiety    Asthma    rarely uses albuterol  rescue   Bipolar 1 disorder (HCC)    Depression    Paranoia (HCC)    Schizophrenia (HCC)    Schizophrenia (HCC)     Past Surgical History:  Procedure Laterality Date   ABDOMINAL HYSTERECTOMY  05/26/2011   Procedure: HYSTERECTOMY ABDOMINAL;  Surgeon: Verlyn Goad, MD;  Location: [facility];  Service: Gynecology;  Laterality: N/A;   CHOLECYSTECTOMY  2011   SHOULDER ARTHROSCOPY Right 08/13/2020   Procedure: RIGHT SHOULDER ARTHROSCOPY WITH DEBRIDEMENT AND SUBACROMIAL DECOMPRESSION;  Surgeon: Arnie Lao, MD;  Location: [facility];  Service: Orthopedics;  Laterality: Right;   TUBAL LIGATION     TUBAL LIGATION     Family History:  Family History  Problem Relation Age of Onset   Hypertension Mother    Family Psychiatric  History: See H&P Social History:  Social History   Substance and Sexual Activity  Alcohol Use No     Social History   Substance and Sexual Activity  Drug Use Yes   Types: Marijuana    Social History   Socioeconomic History   Marital status: Divorced    Spouse name: Not on file   Number of children: Not on file   Years of education: Not on file   Highest education level: Not on file  Occupational History   Not on file  Tobacco Use   Smoking status: Every Day    Current packs/day: 0.25    Average packs/day: 0.3 packs/day for 24.0 years (6.0 ttl pk-yrs)    Types: Cigarettes   Smokeless tobacco: Never  Vaping Use   Vaping status: Never Used  Substance and Sexual Activity   Alcohol use: No   Drug use: Yes    Types: Marijuana   Sexual activity: Yes    Birth control/protection: Surgical   Other Topics Concern   Not on file  Social History Narrative   Not on file   Social Drivers of Health   Financial Resource Strain: Not on file  Food Insecurity: No Food Insecurity (08/07/2023)   Hunger Vital Sign    Worried About Running Out of Food in the Last Year: Never true    Ran Out of Food in the Last Year: Never true  Transportation Needs: No Transportation Needs (08/07/2023)   PRAPARE - Administrator, Civil Service (Medical): No    Lack of Transportation (Non-Medical): No  Physical Activity: Not on file  Stress: Not on file  Social Connections: Not on file   Additional Social History:    Sleep: Fair  Appetite:  Good  Current Medications: Current Facility-Administered Medications  Medication Dose Route Frequency Provider Last Rate Last Admin   acetaminophen  (TYLENOL ) tablet 650 mg  650 mg Oral Q6H PRN Bobbitt, Shalon E, NP       alum & mag hydroxide-simeth (MAALOX/MYLANTA) 200-200-20 MG/5ML suspension 30 mL  30 mL Oral Q4H PRN Bobbitt, Shalon E, NP       haloperidol  (HALDOL ) tablet 5 mg  5 mg Oral TID PRN Bobbitt, Shalon E, NP   5 mg at 08/09/23 1610   And   diphenhydrAMINE  (BENADRYL ) capsule 50 mg  50 mg Oral TID PRN Bobbitt, Shalon E, NP   50 mg at  08/09/23 0624   haloperidol  lactate (HALDOL ) injection 5 mg  5 mg Intramuscular TID PRN Bobbitt, Shalon E, NP       And   diphenhydrAMINE  (BENADRYL ) injection 50 mg  50 mg Intramuscular TID PRN Bobbitt, Shalon E, NP       And   LORazepam  (ATIVAN ) injection 2 mg  2 mg Intramuscular TID PRN Bobbitt, Shalon E, NP       haloperidol  lactate (HALDOL ) injection 10 mg  10 mg Intramuscular TID PRN Bobbitt, Shalon E, NP       And   diphenhydrAMINE  (BENADRYL ) injection 50 mg  50 mg Intramuscular TID PRN Bobbitt, Shalon E, NP       And   LORazepam  (ATIVAN ) injection 2 mg  2 mg Intramuscular TID PRN Bobbitt, Shalon E, NP       gabapentin  (NEURONTIN ) capsule 200 mg  200 mg Oral BID Onuoha, Josephine C, NP   200 mg at  08/10/23 0814   hydrOXYzine  (ATARAX ) tablet 25 mg  25 mg Oral TID PRN Bobbitt, Shalon E, NP   25 mg at 08/10/23 0934   magnesium  hydroxide (MILK OF MAGNESIA) suspension 30 mL  30 mL Oral Daily PRN Bobbitt, Shalon E, NP       nicotine  (NICODERM CQ  - dosed in mg/24 hours) patch 14 mg  14 mg Transdermal Daily Attiah, Nadir, MD   14 mg at 08/09/23 1610   OLANZapine  zydis (ZYPREXA ) disintegrating tablet 15 mg  15 mg Oral QHS Parker, Alvin S, MD   15 mg at 08/09/23 2120   OXcarbazepine (TRILEPTAL) tablet 150 mg  150 mg Oral Daily Onuoha, Josephine C, NP   150 mg at 08/10/23 9604   traZODone  (DESYREL ) tablet 100 mg  100 mg Oral QHS PRN Tahja Liao C, FNP       Vitamin D (Ergocalciferol) (DRISDOL) 1.25 MG (50000 UNIT) capsule 50,000 Units  50,000 Units Oral Daily Parker, Alvin S, MD   50,000 Units at 08/10/23 5409    Lab Results:  Results for orders placed or performed during the hospital encounter of 08/07/23 (from the past 48 hours)  Vitamin B12     Status: None   Collection Time: 08/08/23  6:27 PM  Result Value Ref Range   Vitamin B-12 226 180 - 914 pg/mL    Comment: (NOTE) This assay is not validated for testing neonatal or myeloproliferative syndrome specimens for Vitamin B12 levels. Performed at [facility], [address] [place], [place] [address]   VITAMIN D 25 Hydroxy (Vit-D Deficiency, Fractures)     Status: Abnormal   Collection Time: 08/08/23  6:27 PM  Result Value Ref Range   Vit D, 25-Hydroxy 21.39 (L) 30 - 100 ng/mL    Comment: (NOTE) Vitamin D deficiency has been defined by the Institute of Medicine  and an Endocrine Society practice guideline as a level of serum 25-OH  vitamin D less than 20 ng/mL (1,2). The Endocrine Society went on to  further define vitamin D insufficiency as a level between 21 and 29  ng/mL (2).  1. IOM (Institute of Medicine). 2010. Dietary reference intakes for  calcium and D. [place]: [employer]. 2.  Holick MF, Binkley [place], Bischoff-Ferrari HA, et al. Evaluation,  treatment, and prevention of vitamin D deficiency: an Endocrine  Society clinical practice guideline, JCEM. 2011 Jul; 96(7): 1911-30.  Performed at [facility], [address] [place], [place] [address]   Hemoglobin A1c  Status: None   Collection Time: 08/08/23  6:27 PM  Result Value Ref Range   Hgb A1c MFr Bld 4.8 4.8 - 5.6 %    Comment: (NOTE)         Prediabetes: 5.7 - 6.4         Diabetes: >6.4         Glycemic control for adults with diabetes: <7.0    Mean Plasma Glucose 91 mg/dL    Comment: (NOTE) Performed At: [facility] [address] [place], [place] [id number] Pearlean Botts MD NW:[id number]   Basic metabolic panel     Status: Abnormal   Collection Time: 08/08/23  6:27 PM  Result Value Ref Range   Sodium 137 135 - 145 mmol/L   Potassium 3.8 3.5 - 5.1 mmol/L   Chloride 104 98 - 111 mmol/L   CO2 24 22 - 32 mmol/L   Glucose, Bld 125 (H) 70 - 99 mg/dL    Comment: Glucose reference range applies only to samples taken after fasting for at least 8 hours.   BUN 15 6 - 20 mg/dL   Creatinine, Ser 5.78 0.44 - 1.00 mg/dL   Calcium 9.4 8.9 - 46.9 mg/dL   GFR, Estimated >62 >95 mL/min    Comment: (NOTE) Calculated using the CKD-EPI Creatinine Equation (2021)    Anion gap 9 5 - 15    Comment: Performed at [facility], [address], [place], [place] [address]  Lipid panel     Status: Abnormal   Collection Time: 08/08/23  6:27 PM  Result Value Ref Range   Cholesterol 188 0 - 200 mg/dL   Triglycerides 244 <010 mg/dL   HDL 45 >27 mg/dL   Total CHOL/HDL Ratio 4.2 RATIO   VLDL 25 0 - 40 mg/dL   LDL Cholesterol 253 (H) 0 - 99 mg/dL    Comment:        Total Cholesterol/HDL:CHD Risk Coronary Heart Disease Risk Table                     Men   Women  1/2 Average Risk   3.4   3.3  Average Risk       5.0   4.4  2 X Average Risk   9.6   7.1  3 X Average Risk  23.4   11.0         Use the calculated Patient Ratio above and the CHD Risk Table to determine the patient's CHD Risk.        ATP III CLASSIFICATION (LDL):  <100     mg/dL   Optimal  664-403  mg/dL   Near or Above                    Optimal  130-159  mg/dL   Borderline  474-259  mg/dL   High  >563     mg/dL   Very High Performed at [facility], [address] [place], [place] [address]   TSH     Status: None   Collection Time: 08/08/23  6:27 PM  Result Value Ref Range   TSH 0.609 0.350 - 4.500 uIU/mL    Comment: Performed by a 3rd Generation assay with a functional sensitivity of <=0.01 uIU/mL. Performed at [facility], [address], [place], [place] [address]    Blood Alcohol level:  Lab Results  Component Value Date   [facility] <15 08/05/2023   ETH <5  10/04/2016   Metabolic Disorder Labs: Lab Results  Component Value Date   HGBA1C 4.8 08/08/2023   MPG 91 08/08/2023   MPG 88 10/06/2016   Lab Results  Component Value Date   PROLACTIN 68.7 (H) 11/26/2014   Lab Results  Component Value Date   CHOL 188 08/08/2023   TRIG 127 08/08/2023   HDL 45 08/08/2023   CHOLHDL 4.2 08/08/2023   VLDL 25 08/08/2023   LDLCALC 118 (H) 08/08/2023   LDLCALC 126 (H) 10/06/2016   Physical Findings: AIMS:  , ,  ,  ,    CIWA:    COWS:     Musculoskeletal: Strength & Muscle Tone: within normal limits Gait & Station: normal Patient leans: N/A  Psychiatric Specialty Exam:  Presentation  General Appearance:  Appropriate for Environment; Casual; Fairly Groomed  Eye Contact: Good  Speech: Clear and Coherent  Speech Volume: Normal  Handedness: Right  Mood and Affect  Mood: Anxious; Depressed  Affect: Congruent  Thought Process  Thought Processes: Coherent  Descriptions of Associations:Intact  Orientation:Full (Time, Place and Person)  Thought Content:Logical  History of Schizophrenia/Schizoaffective disorder:No data recorded Duration  of Psychotic Symptoms:No data recorded Hallucinations:Hallucinations: Auditory Description of Command Hallucinations: Hearing voices telling her that she is worthless and hopeless and that she needed to harm myself.  However voices are moderate today not loud Description of Auditory Hallucinations: Hearing negative voices that are moderate in volume  Ideas of Reference:None  Suicidal Thoughts:Suicidal Thoughts: Yes, Passive SI Passive Intent and/or Plan: Without Intent; Without Means to Carry Out; Without Plan  Homicidal Thoughts:Homicidal Thoughts: No  Sensorium  Memory: Immediate Fair; Recent Fair  Judgment: Fair  Insight: Fair  Executive Functions  Concentration: Good  Attention Span: Good  Recall: Fair  Fund of Knowledge: Fair  Language: Good  Psychomotor Activity  Psychomotor Activity: Psychomotor Activity: Normal  Assets  Assets: Communication Skills; Desire for Improvement; Physical Health; Resilience  Sleep  Sleep: Sleep: Good Number of Hours of Sleep: 7.5  Physical Exam: Physical Exam Vitals and nursing note reviewed.  Constitutional:      General: She is not in acute distress.    Appearance: She is not toxic-appearing.  HENT:     Head: Normocephalic.     Right Ear: External ear normal.     Left Ear: External ear normal.     Nose: Nose normal.     Mouth/Throat:     Mouth: Mucous membranes are moist.  Eyes:     Extraocular Movements: Extraocular movements intact.  Cardiovascular:     Rate and Rhythm: Normal rate.     Pulses: Normal pulses.  Pulmonary:     Effort: Pulmonary effort is normal.  Abdominal:     Comments: Deferred  Genitourinary:    Comments: Deferred  Musculoskeletal:        General: Normal range of motion.     Cervical back: Normal range of motion.  Skin:    General: Skin is warm.  Neurological:     General: No focal deficit present.     Mental Status: She is alert and oriented to person, place, and time.   Psychiatric:        Mood and Affect: Mood normal.     Comments: Mood and behavior improving   Review of Systems  Constitutional:  Negative for chills and fever.  HENT:  Negative for sore throat.   Eyes:  Negative for blurred vision.  Respiratory:  Negative for cough, sputum production, shortness of breath and  wheezing.   Cardiovascular:  Negative for chest pain and palpitations.  Gastrointestinal:  Negative for heartburn, nausea and vomiting.  Genitourinary:  Negative for dysuria.  Musculoskeletal:  Negative for myalgias.  Skin:  Negative for itching and rash.  Neurological:  Negative for dizziness and headaches.  Endo/Heme/Allergies:        See allergy listing  Psychiatric/Behavioral:  Positive for depression, hallucinations and suicidal ideas. Negative for substance abuse. The patient is nervous/anxious and has insomnia.    Blood pressure 117/86, pulse 67, temperature 98 F (36.7 C), temperature source Oral, resp. rate 16, height 5' (1.524 m), weight 59.3 kg, last menstrual period 04/12/2011, SpO2 100%. Body mass index is 25.55 kg/m.  Treatment Plan Summary: Daily contact with patient to assess and evaluate symptoms and progress in treatment and Medication management Physician Treatment Plan for Primary Diagnosis: MDD (major depressive disorder), recurrent severe, without psychosis (HCC)   Long Term Goal(s): Improvement in symptoms so as ready for discharge   Short Term Goals: Ability to identify changes in lifestyle to reduce recurrence of condition will improve, Ability to verbalize feelings will improve, Ability to disclose and discuss suicidal ideas, Ability to demonstrate self-control will improve, Ability to identify and develop effective coping behaviors will improve, Ability to maintain clinical measurements within normal limits will improve, Compliance with prescribed medications will improve, and Ability to identify triggers associated with substance abuse/mental health  issues will improve   Physician Treatment Plan for Secondary Diagnosis:  Assessment: Valerie Lee is a 48 year old African-American female with prior psychiatric history significant for MDD recurrent severe without psychotic features, schizoaffective disorder mixed type, depression, and bipolar 1 disorder.  [facility] from [facility] health ED at [facility] for worsening depression resulting in suicidal ideation with overdose intent in the context of  uninformed break-up by the fianc. Principal Problem:  MDD (major depressive disorder), recurrent severe, without psychosis (HCC)   Plans: Medications: -- Continue Zyprexa  disintegrating tab 15 mg p.o. at bedtime for psychosis --Continue Trileptal tablet 150 mg p.o. daily for mood stabilization --Continue gabapentin  capsule 200 mg p.o. twice daily -- Continue trazodone  tablet from 50 mg to 100 mg daily p.o. q. nightly as needed for insomnia -- Continue hydroxyzine  25 mg p.o. 3 times daily as needed for anxiety.   Other PRN Medications  -Acetaminophen  650 mg every 6 as needed/mild pain  -Maalox 30 mL oral every 4 as needed/digestion  -Magnesium  hydroxide 30 mL daily as needed/mild constipation    --The risks/benefits/side-effects/alternatives to this medication were discussed in detail with the patient and time was given for questions. The patient consents to medication trial.   -- Metabolic profile and EKG monitoring obtained while on an atypical antipsychotic (BMI: Lipid Panel: HbgA1c: QTc:)   -- Encouraged patient to participate in unit milieu and in scheduled group therapies    Admission labs reviewed: CMP: Potassium level 3.3 replaced at the hospital, BMP ordered.  Otherwise normal.  CBC with differential: WBC 13.3 elevated, RBC 3.77 low, MCV 100.3 elevated, neutrophils 8.8 elevated, otherwise normal.  UDS positive for marijuana.   New labs ordered: BMP, hemoglobin A1c, vitamin D  25-hydroxy, vitamin B12, lipid panel, TSH.   EKG reviewed: Sinus bradycardia, ventricular rate 55, QT/QTc 404/386.   Continue BH Agitation Protocol  --Haldol  5 mg, oral, 3 times daily as needed, mild agitation  --Benadryl  50 mg, oral, 3 times daily as needed, mild agitation  OR   --Haldol  injection 5 mg, IM, 3 times daily as needed, moderate agitation  --Benadryl  injection 50 mg, IM, 3 times daily as needed, moderate agitation  --Ativan  injection 2 mg, IM, 3 times daily as needed, moderate agitation                                     OR  --Haldol  injection 10 mg, IM, 3 times daily as needed, severe agitation  --Benadryl  injection 50 mg, IM, 3 times daily as needed, severe agitation  --Ativan  injection 2 mg, IM, 3 times daily as needed, severe agitation    Safety and Monitoring:  Voluntary admission to inpatient psychiatric unit for safety, stabilization and treatment  Daily contact with patient to assess and evaluate symptoms and progress in treatment  Patient's case to be discussed in multi-disciplinary team meeting  Observation Level : q15 minute checks  Vital signs: q12 hours  Precautions: suicide, but pt currently verbally contracts for safety on unit?    Discharge Planning:  Social work and case management to assist with discharge planning and identification of hospital follow-up needs prior to discharge  Estimated LOS: 5-7?days    Discharge Concerns: Need to establish a safety plan; Medication compliance and effectiveness  Discharge Goals: Return home with outpatient referrals for mental health follow-up including medication management/psychotherapy.    Valerie Pons, FNP 08/10/2023, 3:12 PM Patient ID: Valerie Lee, female   DOB: 08-10-1975, 48 y.o.   MRN: [id number]

## 2023-08-10 NOTE — Group Note (Signed)
 Occupational Therapy Group Note  Group Topic: Sleep Hygiene  Group Date: 08/10/2023 Start Time: 1400 End Time: 1430 Facilitators: Lynnda Sas, OT   Group Description: Group encouraged increased participation and engagement through topic focused on sleep hygiene. Patients reflected on the quality of sleep they typically receive and identified areas that need improvement. Group was given background information on sleep and sleep hygiene, including common sleep disorders. Group members also received information on how to improve one's sleep and introduced a sleep diary as a tool that can be utilized to track sleep quality over a length of time. Group session ended with patients identifying one or more strategies they could utilize or implement into their sleep routine in order to improve overall sleep quality.        Therapeutic Goal(s):  Identify one or more strategies to improve overall sleep hygiene  Identify one or more areas of sleep that are negatively impacted (sleep too much, too little, etc)     Participation Level: Engaged   Participation Quality: Independent   Behavior: Appropriate   Speech/Thought Process: Relevant   Affect/Mood: Appropriate   Insight: Fair   Judgement: Fair      Modes of Intervention: Education  Patient Response to Interventions:  Engaged   Plan: Continue to engage patient in OT groups 2 - 3x/week.  08/10/2023  Lynnda Sas, OT  Valerie Lee, OT

## 2023-08-11 MED ORDER — OXCARBAZEPINE 300 MG PO TABS
300.0000 mg | ORAL_TABLET | Freq: Every day | ORAL | Status: DC
  Administered 2023-08-12 – 2023-08-14 (×3): 300 mg via ORAL
  Filled 2023-08-11 (×5): qty 1

## 2023-08-11 NOTE — Group Note (Signed)
 Recreation Therapy Group Note   Group Topic:Problem Solving  Group Date: 08/11/2023 Start Time: 0935 End Time: 0958 Facilitators: Nikoloz Huy-McCall, LRT,CTRS Location: [address] Dayroom   Group Topic: Communication, Team Building, Problem Solving  Goal Area(s) Addresses:  Patient will effectively work with peer towards shared goal.  Patient will identify skills used to make activity successful.  Patient will identify how skills used during activity can be used to reach post d/c goals.   Intervention: STEM Activity  Activity: Stage manager. In teams of 3-5, patients were given 12 plastic drinking straws and an equal length of masking tape. Using the materials provided, patients were asked to build a landing pad to catch a golf ball dropped from approximately 5 feet in the air. All materials were required to be used by the team in their design. LRT facilitated post-activity discussion.  Education: Pharmacist, community, Scientist, physiological, Discharge Planning   Education Outcome: Acknowledges education/In group clarification offered/Needs additional education.    Affect/Mood: Flat   Participation Level: Minimal   Participation Quality: Independent   Behavior: Attentive    Speech/Thought Process: Relevant   Insight: Moderate   Judgement: Moderate   Modes of Intervention: STEM Activity   Patient Response to Interventions:  Attentive   Education Outcome:  In group clarification offered    Clinical Observations/Individualized Feedback: Pt started group assisting peers in creating their landing pad. Pt left then came back. Once back in group, pt appeared distracted and focused on whatever happened outside of group. Pt was tearful for remainder of group.      Plan: Continue to engage patient in RT group sessions 2-3x/week.   Terena Bohan-McCall, LRT,CTRS 08/11/2023 12:42 PM

## 2023-08-11 NOTE — Plan of Care (Signed)
  Problem: Education: Goal: Knowledge of [facility] Education information/materials will improve Outcome: Completed/Met Goal: Emotional status will improve Outcome: Progressing Goal: Mental status will improve Outcome: Progressing Goal: Verbalization of understanding the information provided will improve Outcome: Progressing   Problem: Safety: Goal: Periods of time without injury will increase Outcome: Progressing
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
## 2023-08-11 NOTE — Progress Notes (Signed)
 Adult Psychoeducational Group Note  Date:  08/11/2023 Time:  8:41 PM  Group Topic/Focus:  Wrap-Up Group:   The focus of this group is to help patients review their daily goal of treatment and discuss progress on daily workbooks.  Participation Level:  Active  Participation Quality:  Appropriate  Affect:  Appropriate  Cognitive:  Appropriate  Insight: Appropriate  Engagement in Group:  Engaged  Modes of Intervention:  Discussion  Additional Comments:  Attend wrap up group AA.  Valerie Lee Long 08/11/2023, 8:41 PM

## 2023-08-11 NOTE — Progress Notes (Signed)
   08/11/23 0730  Psych Admission Type (Psych Patients Only)  Admission Status Voluntary  Psychosocial Assessment  Patient Complaints Anxiety  Eye Contact Fair  Facial Expression Anxious  Affect Anxious  Speech Logical/coherent  Interaction Assertive  Motor Activity Slow  Appearance/Hygiene Unremarkable  Behavior Characteristics Cooperative  Mood Pleasant  Thought Process  Coherency WDL  Content WDL  Delusions None reported or observed  Perception Hallucinations  Hallucination Auditory  Judgment Impaired  Confusion None  Danger to Self  Current suicidal ideation? Passive  Self-Injurious Behavior No self-injurious ideation or behavior indicators observed or expressed   Agreement Not to Harm Self Yes  Description of Agreement verbal  Danger to Others  Danger to Others None reported or observed

## 2023-08-11 NOTE — [facility] Group Notes (Signed)
 The focus of this group is to help patients establish daily goals to achieve during treatment and discuss how the patient can incorporate goal setting into their daily lives to aide in recovery.         Scale 1-10  6    Goal: To make my bed everyday shower less

## 2023-08-11 NOTE — Progress Notes (Signed)
 Provided follow up support for Valerie Lee who was having a difficult day.  She is still seeing a demon in her vision and this is distressing to her.  She has support from her pastor who offered prayer for her over the phone just prior to lunch.  Provided listening as well as emotional support as she shared.
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
## 2023-08-11 NOTE — Plan of Care (Signed)
   Problem: Education: Goal: Emotional status will improve Outcome: Progressing Goal: Mental status will improve Outcome: Progressing   Problem: Activity: Goal: Interest or engagement in activities will improve Outcome: Progressing Goal: Sleeping patterns will improve Outcome: Progressing   Problem: Safety: Goal: Periods of time without injury will increase Outcome: Progressing

## 2023-08-11 NOTE — Progress Notes (Signed)
   08/10/23 2300  Psych Admission Type (Psych Patients Only)  Admission Status Voluntary  Psychosocial Assessment  Patient Complaints Anxiety;Depression (anxiety 10/10, depression 10/10)  Eye Contact Fair  Facial Expression Anxious  Affect Anxious  Speech Logical/coherent  Interaction Assertive  Motor Activity Slow  Appearance/Hygiene Unremarkable  Behavior Characteristics Cooperative;Anxious  Mood Pleasant  Thought Process  Coherency WDL  Content WDL  Delusions None reported or observed  Perception Hallucinations  Hallucination Auditory  Judgment Impaired  Confusion None  Danger to Self  Current suicidal ideation? Passive  Self-Injurious Behavior No self-injurious ideation or behavior indicators observed or expressed   Agreement Not to Harm Self Yes  Description of Agreement verbal  Danger to Others  Danger to Others None reported or observed

## 2023-08-11 NOTE — Group Note (Signed)
 Date:  08/11/2023 Time:  2:21 AM  Group Topic/Focus:  Wrap-Up Group:   The focus of this group is to help patients review their daily goal of treatment and discuss progress on daily workbooks.    Participation Level:  Active  Participation Quality:  Appropriate and Sharing  Affect:  Appropriate  Cognitive:  Appropriate  Insight: Appropriate  Engagement in Group:  Engaged  Modes of Intervention:  Activity and Socialization  Additional Comments:  Patient attended and participated wrap up group. Patient shared about their day and participated in activity.   Dillard Frame 08/11/2023, 2:21 AM

## 2023-08-11 NOTE — Care Management Important Message (Signed)
 Medicare IM printed and given to social work to give to the patient. ?

## 2023-08-12 MED ORDER — DOCUSATE SODIUM 100 MG PO CAPS
100.0000 mg | ORAL_CAPSULE | Freq: Two times a day (BID) | ORAL | Status: DC
  Administered 2023-08-12 – 2023-08-17 (×10): 100 mg via ORAL
  Filled 2023-08-12 (×12): qty 1

## 2023-08-12 NOTE — [facility] Group Notes (Signed)
 [facility] Group Notes:  (Nursing/MHT/Case Management/Adjunct)  Date:  08/12/2023  Time:  2000  Type of Therapy:  Wrap up group  Participation Level:  Active  Participation Quality:  Appropriate, Attentive, Sharing, and Supportive  Affect:  Appropriate  Cognitive:  Alert  Insight:  Improving  Engagement in Group:  Engaged  Modes of Intervention:  Clarification, Education, and Support  Summary of Progress/Problems: Positive thinking and positive change were discussed.   Valerie Lee 08/12/2023, 9:56 PM

## 2023-08-12 NOTE — Progress Notes (Signed)
   08/12/23 0900  Psych Admission Type (Psych Patients Only)  Admission Status Voluntary  Psychosocial Assessment  Patient Complaints Anxiety;Depression;Other (Comment) ([facility])  Eye Contact Fair  Facial Expression Animated;Anxious  Affect Anxious  Speech Logical/coherent  Interaction Assertive  Motor Activity Fidgety  Appearance/Hygiene Unremarkable  Behavior Characteristics Cooperative  Mood Pleasant;Sad  Thought Process  Coherency WDL  Content WDL  Delusions None reported or observed  Perception Hallucinations  Hallucination Auditory  Judgment Impaired  Confusion None  Danger to Self  Current suicidal ideation? Active  Self-Injurious Behavior Some self-injurious ideation observed or expressed.  No lethal plan expressed   Agreement Not to Harm Self Yes  Description of Agreement verbal  Danger to Others  Danger to Others None reported or observed

## 2023-08-12 NOTE — Progress Notes (Signed)
 [name] MD Progress Note  08/12/2023 12:55 PM Valerie Valerie Lee  MRN:  [id number]  Principal Problem: Major depressive disorder, single episode, severe with psychotic features (HCC) Diagnosis: Principal Problem:   Major depressive disorder, single episode, severe with psychotic features (HCC) Active Problems:   MDD (major depressive disorder), recurrent severe, without psychosis (HCC)  Reason for admission: Valerie Valerie Lee is a 48 year old African-American female with prior psychiatric history significant for MDD recurrent severe without psychotic features, schizoaffective disorder mixed type, depression, and bipolar 1 disorder.  Patient presents voluntarily to [facility] from [facility] health ED at [facility] for worsening depression resulting in suicidal ideation with overdose intent, in the context of  uninformed break-up by the fianc.   24-hour chart reviewed: Vital signs without critical values.  Patient compliant with psychotropic medication.  No Agitation protocol required .  As needed hydroxyzine  for agitation x 1, trazodone  x 1 for sleep, and Tylenol  x 1 for pain required.  Yesterday the psychiatry team made the following recommendations: --Continue Zyprexa  disintegrating tab 15 mg p.o. at bedtime for psychosis -- IncreaseTrileptal tablet from 150 mg to 300 mg p.o. daily for mood stabilization --Continue gabapentin  capsule 200 mg p.o. twice daily --Continue trazodone  tablet 100 mg daily p.o. q. nightly as needed for insomnia -- Continue hydroxyzine  25 mg p.o. 3 times daily as needed for anxiety.   Today'Valerie Lee Assessment Notes: Patient presented today with pleasant and less depressed mood.  She continues on Trileptal  tablets  300 mg p.o. daily for mood stabilization, which she reports is effective.  She presents alert, with behavior more organized compared to admission.  Patient is oriented to person, place, time and situation during assessment today.  Rates depression as #6/10, with 10 being high severity. Report continues auditory hallucinations, however, voices are moderate in intensity.  Reports visual hallucination of seeing demons, however, "the demands are not reaching out to grab me."  Continues on  Zyprexa  increased 15 mg p.o. q. nightly for psychosis.  Speech is clear, coherent with normal volume and pattern.  Able to participate in answering assessment questions today.  Endorses passive SI without intent or plans. Able to contract for safety while in the hospital. We will continue 15 minutes safety checks per nursing staff.  Nursing staff reports sleep Hours of 7.5 with trazodone  100 mg p.o. q. nightly.  Reports not having a bowel movement for few days, Colace 100 mg p.o. twice daily initiated.  Reports that anxiety is rated #6/10, with 10 being high severity.  Encouraged to obtain hydroxyzine  as needed for anxiety.  Appetite is better Concentration is okay Energy level is adequate Endorses suicidal ideation, without suicidal intent and plan.  Able to contract for safety Denies having any HI.  Denies having psychotic symptoms.   Denies having side effects to current psychiatric medications.   We discussed compliance to current medication regimen.  Discussed the following psychosocial stressors: Of breaking up with her fianc.  Emotional support and active listening provided.  Total Time spent with patient: 45 minutes  Past Psychiatric History: Previous Psych Diagnoses: Major depressive disorder without psychosis, schizoaffective disorder mixed type, suicidal ideation Prior inpatient treatment: Yes x 3, last admission in 2018 at [facility] Current/prior outpatient treatment: Report being at [facility] for therapy 1-1/2 years ago Prior rehab hx: Denies Psychotherapy hx: Denies History of suicide: X 1 attempted to run in front of oncoming [employer] transportation bus in 2018 History of homicide or aggression: Denies Psychiatric  medication  history: Patient has been managed in the past with Abilify , gabapentin , Lamictal , Latuda , and trazodone  Psychiatric medication compliance history: Reports noncompliance Neuromodulation history: Denies Current Psychiatrist: Denies Current therapist: Denies  Past Medical History:  Past Medical History:  Diagnosis Date   Anemia    Anxiety    Asthma    rarely uses albuterol  rescue   Bipolar 1 disorder (HCC)    Depression    Paranoia (HCC)    Schizophrenia (HCC)    Schizophrenia (HCC)     Past Surgical History:  Procedure Laterality Date   ABDOMINAL HYSTERECTOMY  05/26/2011   Procedure: HYSTERECTOMY ABDOMINAL;  Surgeon: Verlyn Goad, MD;  Location: [facility];  Service: Gynecology;  Laterality: N/A;   CHOLECYSTECTOMY  2011   SHOULDER ARTHROSCOPY Right 08/13/2020   Procedure: RIGHT SHOULDER ARTHROSCOPY WITH DEBRIDEMENT AND SUBACROMIAL DECOMPRESSION;  Surgeon: Arnie Lao, MD;  Location: [facility];  Service: Orthopedics;  Laterality: Right;   TUBAL LIGATION     TUBAL LIGATION     Family History:  Family History  Problem Relation Age of Onset   Hypertension Mother    Family Psychiatric  History: See H&P Social History:  Social History   Substance and Sexual Activity  Alcohol Use No     Social History   Substance and Sexual Activity  Drug Use Yes   Types: Marijuana    Social History   Socioeconomic History   Marital status: Divorced    Spouse name: Not on file   Number of children: Not on file   Years of education: Not on file   Highest education level: Not on file  Occupational History   Not on file  Tobacco Use   Smoking status: Every Day    Current packs/day: 0.25    Average packs/day: 0.3 packs/day for 24.0 years (6.0 ttl pk-yrs)    Types: Cigarettes   Smokeless tobacco: Never  Vaping Use   Vaping status: Never Used  Substance and Sexual Activity   Alcohol use: No   Drug use: Yes    Types: Marijuana   Sexual activity:  Yes    Birth control/protection: Surgical  Other Topics Concern   Not on file  Social History Narrative   Not on file   Social Drivers of Health   Financial Resource Strain: Not on file  Food Insecurity: No Food Insecurity (08/07/2023)   Hunger Vital Sign    Worried About Running Out of Food in the Last Year: Never true    Ran Out of Food in the Last Year: Never true  Transportation Needs: No Transportation Needs (08/07/2023)   PRAPARE - Administrator, Civil Service (Medical): No    Lack of Transportation (Non-Medical): No  Physical Activity: Not on file  Stress: Not on file  Social Connections: Not on file   Additional Social History:    Sleep: Fair  Appetite:  Good  Current Medications: Current Facility-Administered Medications  Medication Dose Route Frequency Provider Last Rate Last Admin   acetaminophen  (TYLENOL ) tablet 650 mg  650 mg Oral Q6H PRN Valerie Valerie Lee, Valerie E, NP   650 mg at 08/11/23 2132   alum & mag hydroxide-simeth (MAALOX/MYLANTA) 200-200-20 MG/5ML suspension 30 mL  30 mL Oral Q4H PRN Valerie Valerie Lee, Valerie E, NP       haloperidol  (HALDOL ) tablet 5 mg  5 mg Oral TID PRN Valerie Valerie Lee, Valerie E, NP   5 mg at 08/09/23 0624   And   diphenhydrAMINE  (BENADRYL ) capsule 50 mg  50  mg Oral TID PRN Valerie Valerie Lee, Valerie E, NP   50 mg at 08/09/23 1027   haloperidol  lactate (HALDOL ) injection 5 mg  5 mg Intramuscular TID PRN Valerie Valerie Lee, Valerie E, NP       And   diphenhydrAMINE  (BENADRYL ) injection 50 mg  50 mg Intramuscular TID PRN Valerie Valerie Lee, Valerie E, NP       And   LORazepam  (ATIVAN ) injection 2 mg  2 mg Intramuscular TID PRN Valerie Valerie Lee, Valerie E, NP       haloperidol  lactate (HALDOL ) injection 10 mg  10 mg Intramuscular TID PRN Valerie Valerie Lee, Valerie E, NP       And   diphenhydrAMINE  (BENADRYL ) injection 50 mg  50 mg Intramuscular TID PRN Valerie Valerie Lee, Valerie E, NP       And   LORazepam  (ATIVAN ) injection 2 mg  2 mg Intramuscular TID PRN Valerie Valerie Lee, Valerie E, NP       gabapentin  (NEURONTIN )  capsule 200 mg  200 mg Oral BID Valerie Valerie Lee, Valerie C, NP   200 mg at 08/12/23 2536   hydrOXYzine  (ATARAX ) tablet 25 mg  25 mg Oral TID PRN Valerie Valerie Lee, Valerie E, NP   25 mg at 08/11/23 2132   magnesium  hydroxide (MILK OF MAGNESIA) suspension 30 mL  30 mL Oral Daily PRN Valerie Valerie Lee, Valerie E, NP       nicotine  (NICODERM CQ  - dosed in mg/24 hours) patch 14 mg  14 mg Transdermal Daily Valerie Valerie Lee, Nadir, MD   14 mg at 08/12/23 0749   OLANZapine  zydis (ZYPREXA ) disintegrating tablet 15 mg  15 mg Oral QHS Valerie Valerie Lee, Valerie S, MD   15 mg at 08/11/23 2132   Oxcarbazepine  (TRILEPTAL ) tablet 300 mg  300 mg Oral Daily Valerie Reaser Valerie Lee, Valerie Valerie Lee   300 mg at 08/12/23 0748   traZODone  (DESYREL ) tablet 100 mg  100 mg Oral QHS PRN Valerie Hagmann Valerie Lee, Valerie Valerie Lee   100 mg at 08/11/23 2132   Vitamin D  (Ergocalciferol ) (DRISDOL ) 1.25 MG (50000 UNIT) capsule 50,000 Units  50,000 Units Oral Daily Valerie Valerie Lee, Valerie S, MD   50,000 Units at 08/12/23 6440    Lab Results:  No results found for this or any previous visit (from the past 48 hours).  Blood Alcohol level:  Lab Results  Component Value Date   [facility] <15 08/05/2023   ETH <5 10/04/2016   Metabolic Disorder Labs: Lab Results  Component Value Date   HGBA1C 4.8 08/08/2023   MPG 91 08/08/2023   MPG 88 10/06/2016   Lab Results  Component Value Date   PROLACTIN 68.7 (H) 11/26/2014   Lab Results  Component Value Date   CHOL 188 08/08/2023   TRIG 127 08/08/2023   HDL 45 08/08/2023   CHOLHDL 4.2 08/08/2023   VLDL 25 08/08/2023   LDLCALC 118 (H) 08/08/2023   LDLCALC 126 (H) 10/06/2016   Physical Findings: AIMS:  , ,  ,  ,    CIWA:    COWS:     Musculoskeletal: Strength & Muscle Tone: within normal limits Gait & Station: normal Patient leans: N/A  Psychiatric Specialty Exam:  Presentation  General Appearance:  Appropriate for Environment; Casual  Eye Contact: Good  Speech: Clear and Coherent  Speech Volume: Normal  Handedness: Right  Mood and Affect  Mood: Anxious;  Depressed  Affect: Congruent; Appropriate  Thought Process  Thought Processes: Coherent  Descriptions of Associations:Intact  Orientation:Full (Time, Place and Person)  Thought Content:Logical  History of Schizophrenia/Schizoaffective disorder:No data recorded Duration of Psychotic Symptoms:No data recorded Hallucinations:Hallucinations: Auditory; Visual  Description of Command Hallucinations: Patient continues to hear voices in her head telling her to harm herself Description of Auditory Hallucinations: Patient continues to hear voices in her head telling her to harm herself Description of Visual Hallucinations: Patient reports seeing the demons, however the demands are not grabbing on her.  Ideas of Reference:None  Suicidal Thoughts:Suicidal Thoughts: Yes, Passive SI Passive Intent and/or Plan: Without Intent; Without Plan; Without Means to Carry Out  Homicidal Thoughts:Homicidal Thoughts: No  Sensorium  Memory: Immediate Good; Recent Good  Judgment: Fair  Insight: Fair  Executive Functions  Concentration: Good  Attention Span: Good  Recall: Fair  Fund of Knowledge: Fair  Language: Good  Psychomotor Activity  Psychomotor Activity: Psychomotor Activity: Normal  Assets  Assets: Communication Skills; Desire for Improvement; Physical Health; Resilience  Sleep  Sleep: Sleep: Good Number of Hours of Sleep: 7.5  Physical Exam: Physical Exam Vitals and nursing note reviewed.  Constitutional:      General: She is not in acute distress.    Appearance: She is not toxic-appearing.  HENT:     Head: Normocephalic.     Right Ear: External ear normal.     Left Ear: External ear normal.     Nose: Nose normal.     Mouth/Throat:     Mouth: Mucous membranes are moist.  Eyes:     Extraocular Movements: Extraocular movements intact.  Cardiovascular:     Rate and Rhythm: Normal rate.     Pulses: Normal pulses.  Pulmonary:     Effort: Pulmonary effort  is normal.  Abdominal:     Comments: Deferred  Genitourinary:    Comments: Deferred  Musculoskeletal:        General: Normal range of motion.     Cervical back: Normal range of motion.  Skin:    General: Skin is warm.  Neurological:     General: No focal deficit present.     Mental Status: She is alert and oriented to person, place, and time.  Psychiatric:        Mood and Affect: Mood normal.     Comments: Mood and behavior improving    Review of Systems  Constitutional:  Negative for chills and fever.  HENT:  Negative for sore throat.   Eyes:  Negative for blurred vision.  Respiratory:  Negative for cough, sputum production, shortness of breath and wheezing.   Cardiovascular:  Negative for chest pain and palpitations.  Gastrointestinal:  Negative for heartburn, nausea and vomiting.  Genitourinary:  Negative for dysuria.  Musculoskeletal:  Negative for myalgias.  Skin:  Negative for itching and rash.  Neurological:  Negative for dizziness and headaches.  Endo/Heme/Allergies:        See allergy listing  Psychiatric/Behavioral:  Positive for depression, hallucinations and suicidal ideas. Negative for substance abuse. The patient is nervous/anxious. The patient does not have insomnia.    Blood pressure 127/77, pulse 66, temperature 98.1 F (36.7 Valerie Lee), temperature source Oral, resp. rate 16, height 5' (1.524 m), weight 59.3 kg, last menstrual period 04/12/2011, SpO2 100%. Body mass index is 25.55 kg/m.  Treatment Plan Summary: Daily contact with patient to assess and evaluate symptoms and progress in treatment and Medication management Physician Treatment Plan for Primary Diagnosis: MDD (major depressive disorder), recurrent severe, without psychosis (HCC)   Long Term Goal(Valerie Lee): Improvement in symptoms so as ready for discharge   Short Term Goals: Ability to identify changes in lifestyle to reduce recurrence of condition will improve, Ability to verbalize feelings will  improve,  Ability to disclose and discuss suicidal ideas, Ability to demonstrate self-control will improve, Ability to identify and develop effective coping behaviors will improve, Ability to maintain clinical measurements within normal limits will improve, Compliance with prescribed medications will improve, and Ability to identify triggers associated with substance abuse/mental health issues will improve   Physician Treatment Plan for Secondary Diagnosis:  Assessment: Valerie Valerie Lee is a 48 year old African-American female with prior psychiatric history significant for MDD recurrent severe without psychotic features, schizoaffective disorder mixed type, depression, and bipolar 1 disorder.  [facility] from [facility] health ED at [facility] for worsening depression resulting in suicidal ideation with overdose intent in the context of  uninformed break-up by the fianc. Principal Problem:  MDD (major depressive disorder), recurrent severe, without psychosis (HCC)   Plans: Medications: --Continue Zyprexa  disintegrating tab 15 mg p.o. at bedtime for psychosis -- Continue Trileptal  tablet 300 mg p.o. daily for mood stabilization --Continue gabapentin  capsule 200 mg p.o. twice daily --Continue trazodone  tablet 100 mg daily p.o. q. nightly as needed for insomnia -- Continue hydroxyzine  25 mg p.o. 3 times daily as needed for anxiety.   Other PRN Medications  -Acetaminophen  650 mg every 6 as needed/mild pain  -Maalox 30 mL oral every 4 as needed/digestion  -Magnesium  hydroxide 30 mL daily as needed/mild constipation    --The risks/benefits/side-effects/alternatives to this medication were discussed in detail with the patient and time was given for questions. The patient consents to medication trial.   -- Metabolic profile and EKG monitoring obtained while on an atypical antipsychotic (BMI: Lipid Panel: HbgA1c: QTc:)   -- Encouraged patient to participate  in unit milieu and in scheduled group therapies    Admission labs reviewed: CMP: Potassium level 3.3 replaced at the hospital, BMP ordered.  Otherwise normal.  CBC with differential: WBC 13.3 elevated, RBC 3.77 low, MCV 100.3 elevated, neutrophils 8.8 elevated, otherwise normal.  UDS positive for marijuana.   New labs ordered: BMP: Potassium 3.8 within normal limits, hemoglobin A1c, 4.8 within normal limits, vitamin D  25-hydroxy: 21.39 low, vitamin B12: 226 within normal limits, lipid panel: LDL 118 elevated, TSH: 0.679 within normal limits.   EKG reviewed: Sinus bradycardia, ventricular rate 55, QT/QTc 404/386.   Continue BH Agitation Protocol  --Haldol  5 mg, oral, 3 times daily as needed, mild agitation  --Benadryl  50 mg, oral, 3 times daily as needed, mild agitation                                      OR   --Haldol  injection 5 mg, IM, 3 times daily as needed, moderate agitation  --Benadryl  injection 50 mg, IM, 3 times daily as needed, moderate agitation  --Ativan  injection 2 mg, IM, 3 times daily as needed, moderate agitation                                     OR  --Haldol  injection 10 mg, IM, 3 times daily as needed, severe agitation  --Benadryl  injection 50 mg, IM, 3 times daily as needed, severe agitation  --Ativan  injection 2 mg, IM, 3 times daily as needed, severe agitation    Safety and Monitoring:  Voluntary admission to inpatient psychiatric unit for safety, stabilization and treatment  Daily contact with patient to assess and evaluate symptoms and  progress in treatment  Patient'Valerie Lee case to be discussed in multi-disciplinary team meeting  Observation Level : q15 minute checks  Vital signs: q12 hours  Precautions: suicide, but pt currently verbally contracts for safety on unit?    Discharge Planning:  Social work and case management to assist with discharge planning and identification of hospital follow-up needs prior to discharge  Estimated LOS: 5-7?days    Discharge  Concerns: Need to establish a safety plan; Medication compliance and effectiveness  Discharge Goals: Return home with outpatient referrals for mental health follow-up including medication management/psychotherapy.    Valerie Pons, Valerie Valerie Lee 08/12/2023, 12:55 PM Patient ID: Cyrena Drilling, female   DOB: 11/04/1975, 48 y.o.   MRN: [id number] Patient ID: BAELYNN SCHMUHL, female   DOB: 1975-11-22, 48 y.o.   MRN: [id number] Patient ID: SHARRA CAYABYAB, female   DOB: Jul 14, 1975, 48 y.o.   MRN: [id number]

## 2023-08-12 NOTE — Plan of Care (Signed)
  Problem: Education: Goal: Mental status will improve Outcome: Progressing   Problem: Activity: Goal: Interest or engagement in activities will improve Outcome: Progressing   Problem: Coping: Goal: Ability to demonstrate self-control will improve Outcome: Progressing   Problem: Health Behavior/Discharge Planning: Goal: Identification of resources available to assist in meeting health care needs will improve Outcome: Progressing

## 2023-08-12 NOTE — Group Note (Signed)
 Date:  08/12/2023 Time:  10:45 AM  Group Topic/Focus:  Goals Group:   The focus of this group is to help patients establish daily goals to achieve during treatment and discuss how the patient can incorporate goal setting into their daily lives to aide in recovery. Orientation:   The focus of this group is to educate the patient on the purpose and policies of crisis stabilization and provide a format to answer questions about their admission.  The group details unit policies and expectations of patients while admitted.    Participation Level:  Active  Valerie Lee 08/12/2023, 10:45 AM

## 2023-08-12 NOTE — Group Note (Signed)
 LCSW Group Therapy Note   Type of Therapy and Topic:  Group Therapy - Safety  Participation Level:  Active   Description of Group This process group involved patients discussing the situations or people in their lives that frequently make them safe or unsafe.  Anxiety was a common factor among all group participants and many of them described home situations that keep them on edge and not able to feel completely safe.  Three questions were addressed during the group:  (1) What makes you feel safe (or unsafe)?  (2) Do you feel safe with yourself and why?  (3) If you don't feel safe, what can you do?  A lengthy discussion ensued in which group members empathized with each other, gave suggestions to one another, and expressed their feelings freely.  Therapeutic Goals Patient will describe what makes them feel safe or unsafe in their everyday lives. Patient will think about and discuss whether they feel safe with themselves and what reasons might contribute to feeling safe or unsafe. Patients will participate in planning for what can be done to help themselves feel safer.   Summary of Patient Progress:  The patient stated that "safety means to me is to stay away from people that don't have the same mindset as I do. Safety for me is being at home with family and church is my 1st safe place.   Therapeutic Modalities Cognitive Behavioral Therapy   Odie Benne, LCSWA 08/12/2023  4:37 PM

## 2023-08-12 NOTE — Plan of Care (Signed)
   Problem: Education: Goal: Emotional status will improve Outcome: Progressing Goal: Mental status will improve Outcome: Progressing   Problem: Activity: Goal: Interest or engagement in activities will improve Outcome: Progressing Goal: Sleeping patterns will improve Outcome: Progressing   Problem: Safety: Goal: Periods of time without injury will increase Outcome: Progressing

## 2023-08-12 NOTE — Group Note (Signed)
 Date:  08/12/2023 Time:  11:00 AM  Group Topic/Focus:  Emotional Education:   The focus of this group is to discuss what feelings/emotions are, and how they are experienced.    Participation Level:  Active  Jnya Brossard J Teja Judice 08/12/2023, 11:00 AM

## 2023-08-12 NOTE — Progress Notes (Signed)
   08/11/23 2000  Psych Admission Type (Psych Patients Only)  Admission Status Voluntary  Psychosocial Assessment  Patient Complaints Anxiety;Depression (Anxiety 10/10, Depression 10/10)  Eye Contact Fair  Facial Expression Anxious  Affect Anxious  Speech Logical/coherent  Interaction Assertive  Motor Activity Slow  Appearance/Hygiene Unremarkable  Behavior Characteristics Cooperative  Mood Pleasant;Sad  Thought Process  Coherency WDL  Content WDL  Delusions None reported or observed  Perception Hallucinations  Hallucination Auditory  Judgment Impaired  Confusion None  Danger to Self  Current suicidal ideation? Passive  Self-Injurious Behavior No self-injurious ideation or behavior indicators observed or expressed   Agreement Not to Harm Self Yes  Description of Agreement verbal  Danger to Others  Danger to Others None reported or observed

## 2023-08-13 MED ORDER — HALOPERIDOL 5 MG PO TABS
5.0000 mg | ORAL_TABLET | Freq: Every morning | ORAL | Status: DC
  Administered 2023-08-13 – 2023-08-15 (×3): 5 mg via ORAL
  Filled 2023-08-13 (×4): qty 1

## 2023-08-13 MED ORDER — HALOPERIDOL 5 MG PO TABS
10.0000 mg | ORAL_TABLET | Freq: Every day | ORAL | Status: DC
  Administered 2023-08-13 – 2023-08-14 (×2): 10 mg via ORAL
  Filled 2023-08-13 (×4): qty 2

## 2023-08-13 NOTE — Group Note (Signed)
 Date:  08/13/2023 Time:  9:28 AM  Group Topic/Focus:  Goals Group:   The focus of this group is to help patients establish daily goals to achieve during treatment and discuss how the patient can incorporate goal setting into their daily lives to aide in recovery. Orientation:   The focus of this group is to educate the patient on the purpose and policies of crisis stabilization and provide a format to answer questions about their admission.  The group details unit policies and expectations of patients while admitted.    Participation Level:  Did Not Attend

## 2023-08-13 NOTE — Progress Notes (Addendum)
 Patient endorses passive SI this morning with thoughts to wrap her hands around her neck and choke herself. Pt is able to verbally contract for safety while on the unit. Pt endorses AVH reporting that she is hearing and seeing "demons". Pt denies HI this morning. Pt rates her depression a 7/10 and anxiety a 7/10. Pt has been interactive on the unit and participating in groups throughout the day. Pt has been calm and cooperative throughout the day. Patient has been compliant with medications and treatment plan. Q 15 minute safety checks are in place for patient's safety. Patient is currently safe on the unit.  Pt has been complaining of left hip pain throughout the day, rating it a 10/10. PRN Tylenol  administered for hip pain per MAR.    08/13/23 0819  Psych Admission Type (Psych Patients Only)  Admission Status Voluntary  Psychosocial Assessment  Patient Complaints Anxiety;Depression;Self-harm thoughts  Eye Contact Fair  Facial Expression Anxious  Affect Anxious  Speech Logical/coherent  Interaction Assertive  Motor Activity Fidgety  Appearance/Hygiene Unremarkable  Behavior Characteristics Cooperative;Anxious  Mood Anxious;Pleasant  Thought Process  Coherency WDL  Content WDL  Delusions None reported or observed  Perception Hallucinations  Hallucination Auditory;Visual ("I am hearing and seeing demons")  Judgment Impaired  Confusion None  Danger to Self  Current suicidal ideation? Passive  Self-Injurious Behavior Some self-injurious ideation observed or expressed.  No lethal plan expressed   Agreement Not to Harm Self Yes  Description of Agreement Pt verbally contracts for safety while on the unit  Danger to Others  Danger to Others None reported or observed

## 2023-08-13 NOTE — Plan of Care (Signed)
   Problem: Education: Goal: Emotional status will improve Outcome: Progressing Goal: Mental status will improve Outcome: Progressing   Problem: Activity: Goal: Interest or engagement in activities will improve Outcome: Progressing Goal: Sleeping patterns will improve Outcome: Progressing   Problem: Safety: Goal: Periods of time without injury will increase Outcome: Progressing

## 2023-08-13 NOTE — Plan of Care (Signed)
   Problem: Education: Goal: Emotional status will improve Outcome: Progressing Goal: Mental status will improve Outcome: Progressing   Problem: Activity: Goal: Interest or engagement in activities will improve Outcome: Progressing Goal: Sleeping patterns will improve Outcome: Progressing

## 2023-08-13 NOTE — Progress Notes (Signed)
   08/12/23 2200  Psych Admission Type (Psych Patients Only)  Admission Status Voluntary  Psychosocial Assessment  Patient Complaints Anxiety;Depression (anxiety 10/10, depression 10/10)  Eye Contact Fair  Facial Expression Anxious  Affect Anxious  Speech Logical/coherent  Interaction Assertive  Motor Activity Slow  Appearance/Hygiene Unremarkable  Behavior Characteristics Cooperative  Mood Pleasant  Thought Process  Coherency WDL  Content WDL  Delusions None reported or observed  Perception Hallucinations  Hallucination Auditory  Judgment Impaired  Confusion None  Danger to Self  Current suicidal ideation? Passive  Self-Injurious Behavior No self-injurious ideation or behavior indicators observed or expressed   Agreement Not to Harm Self Yes  Description of Agreement verbal  Danger to Others  Danger to Others None reported or observed

## 2023-08-13 NOTE — Group Note (Signed)
 Date:  08/13/2023 Time:  9:50 PM  Group Topic/Focus:  Wrap-Up Group:   The focus of this group is to help patients review their daily goal of treatment and discuss progress on daily workbooks.    Participation Level:  Active  Participation Quality:  Appropriate and Attentive  Affect:  Appropriate  Cognitive:  Appropriate  Insight: Appropriate and Good  Engagement in Group:  Engaged  Modes of Intervention:  Discussion  Additional Comments:  patient stated that she had an exceptional day.  She stated that she wants to work on going home.  Moishe Angel 08/13/2023, 9:50 PM

## 2023-08-13 NOTE — Progress Notes (Signed)
 [facility] MD Progress Note  08/13/2023 11:10 AM Valerie Lee  MRN:  [id number]  Principal Problem: Major depressive disorder, single episode, severe with psychotic features (HCC) Diagnosis: Principal Problem:   Major depressive disorder, single episode, severe with psychotic features (HCC) Active Problems:   MDD (major depressive disorder), recurrent severe, without psychosis (HCC)  Reason for admission: Valerie Lee is a 48 year old African-American female with prior psychiatric history significant for MDD recurrent severe without psychotic features, schizoaffective disorder mixed type, depression, and bipolar 1 disorder.  Patient presents voluntarily to [facility] from [facility] health ED at [facility] for worsening depression resulting in suicidal ideation with overdose intent, in the context of  uninformed break-up by the fianc.   24-hour chart reviewed: No acute events occurred overnight.  Patient was compliant with psychiatric medications.  She has been visible in the milieu and attending groups.  Yesterday the psychiatry team made the following recommendations: -- Continue Zyprexa  disintegrating tablet 15 mg p.o. at bedtime for psychosis --Continue Trileptal  tablet 150 mg p.o. daily for mood stabilization --Continue gabapentin  capsule 200 mg p.o. twice daily -- Continue trazodone  tablet 100 mg daily p.o. q. nightly as needed for insomnia -- Continue hydroxyzine  25 mg p.o. 3 times daily as needed for anxiety.  Today's Assessment Notes: On exam today, the patient tells me that she has been lying to staff because "I do not want people to think I am crazy".  She tells me that she is experiencing auditory hallucinations of "more voices and I can count".  She tells me the voices are telling things like "kill other people, kill myself, do not take that medicine".  She reports that they are all negative and never positive.  She complains of visual  hallucinations of devils and demons.  She reports that she can see demons vividly, but states that she sees them less when she is around people.  She does report that being around people increases her anxiety though.  She states that she sees little devils and small devils.  She reports that they have "lots of different appearances" and that the main 1 is the devil that comes to her when she is trying to sleep at night.  She tells me that she was previously diagnosed with "multiple personalities "by "that placed [place] that got [place]".  She feels like she is not getting any relief from hallucinations with olanzapine .  She tells me that she took another medicine in the past that she felt worked better, but she cannot remember the name.  Per chart review she has had trials of Risperdal  and Haldol .  We discussed restarting Haldol  at 5 mg every morning and 10 mg nightly.  Total Time spent with patient: 30 minutes  Past Psychiatric History: Previous Psych Diagnoses: Major depressive disorder without psychosis, schizoaffective disorder mixed type, suicidal ideation Prior inpatient treatment: Yes x 3, last admission in 2018 at [facility] Current/prior outpatient treatment: Report being at [facility] for therapy 1-1/2 years ago Prior rehab hx: Denies Psychotherapy hx: Denies History of suicide: X 1 attempted to run in front of oncoming [place] transportation bus in 2018 History of homicide or aggression: Denies Psychiatric medication history: Patient has been managed in the past with Abilify , gabapentin , Lamictal , Latuda , and trazodone  Psychiatric medication compliance history: Reports noncompliance Neuromodulation history: Denies Current Psychiatrist: Denies Current therapist: Denies  Past Medical History:  Past Medical History:  Diagnosis Date   Anemia    Anxiety  Asthma    rarely uses albuterol  rescue   Bipolar 1 disorder (HCC)    Depression    Paranoia (HCC)    Schizophrenia (HCC)     Schizophrenia (HCC)     Past Surgical History:  Procedure Laterality Date   ABDOMINAL HYSTERECTOMY  05/26/2011   Procedure: HYSTERECTOMY ABDOMINAL;  Surgeon: Verlyn Goad, MD;  Location: [facility];  Service: Gynecology;  Laterality: N/A;   CHOLECYSTECTOMY  2011   SHOULDER ARTHROSCOPY Right 08/13/2020   Procedure: RIGHT SHOULDER ARTHROSCOPY WITH DEBRIDEMENT AND SUBACROMIAL DECOMPRESSION;  Surgeon: Arnie Lao, MD;  Location: [facility];  Service: Orthopedics;  Laterality: Right;   TUBAL LIGATION     TUBAL LIGATION     Family History:  Family History  Problem Relation Age of Onset   Hypertension Mother    Family Psychiatric  History: See H&P Social History:  Social History   Substance and Sexual Activity  Alcohol Use No     Social History   Substance and Sexual Activity  Drug Use Yes   Types: Marijuana    Social History   Socioeconomic History   Marital status: Divorced    Spouse name: Not on file   Number of children: Not on file   Years of education: Not on file   Highest education level: Not on file  Occupational History   Not on file  Tobacco Use   Smoking status: Every Day    Current packs/day: 0.25    Average packs/day: 0.3 packs/day for 24.0 years (6.0 ttl pk-yrs)    Types: Cigarettes   Smokeless tobacco: Never  Vaping Use   Vaping status: Never Used  Substance and Sexual Activity   Alcohol use: No   Drug use: Yes    Types: Marijuana   Sexual activity: Yes    Birth control/protection: Surgical  Other Topics Concern   Not on file  Social History Narrative   Not on file   Social Drivers of Health   Financial Resource Strain: Not on file  Food Insecurity: No Food Insecurity (08/07/2023)   Hunger Vital Sign    Worried About Running Out of Food in the Last Year: Never true    Ran Out of Food in the Last Year: Never true  Transportation Needs: No Transportation Needs (08/07/2023)   PRAPARE - Scientist, research (physical sciences) (Medical): No    Lack of Transportation (Non-Medical): No  Physical Activity: Not on file  Stress: Not on file  Social Connections: Not on file   Additional Social History:    Sleep: Fair  Appetite:  Good  Current Medications: Current Facility-Administered Medications  Medication Dose Route Frequency Provider Last Rate Last Admin   acetaminophen  (TYLENOL ) tablet 650 mg  650 mg Oral Q6H PRN Bobbitt, Shalon E, NP   650 mg at 08/13/23 0900   alum & mag hydroxide-simeth (MAALOX/MYLANTA) 200-200-20 MG/5ML suspension 30 mL  30 mL Oral Q4H PRN Bobbitt, Shalon E, NP       haloperidol  (HALDOL ) tablet 5 mg  5 mg Oral TID PRN Bobbitt, Shalon E, NP   5 mg at 08/09/23 9147   And   diphenhydrAMINE  (BENADRYL ) capsule 50 mg  50 mg Oral TID PRN Bobbitt, Shalon E, NP   50 mg at 08/09/23 8295   haloperidol  lactate (HALDOL ) injection 5 mg  5 mg Intramuscular TID PRN Bobbitt, Shalon E, NP       And   diphenhydrAMINE  (BENADRYL ) injection 50 mg  50 mg  Intramuscular TID PRN Bobbitt, Shalon E, NP       And   LORazepam  (ATIVAN ) injection 2 mg  2 mg Intramuscular TID PRN Bobbitt, Shalon E, NP       haloperidol  lactate (HALDOL ) injection 10 mg  10 mg Intramuscular TID PRN Bobbitt, Shalon E, NP       And   diphenhydrAMINE  (BENADRYL ) injection 50 mg  50 mg Intramuscular TID PRN Bobbitt, Shalon E, NP       And   LORazepam  (ATIVAN ) injection 2 mg  2 mg Intramuscular TID PRN Bobbitt, Shalon E, NP       docusate sodium  (COLACE) capsule 100 mg  100 mg Oral BID Ntuen, Tina C, FNP   100 mg at 08/13/23 0736   gabapentin  (NEURONTIN ) capsule 200 mg  200 mg Oral BID Onuoha, Josephine C, NP   200 mg at 08/13/23 0735   haloperidol  (HALDOL ) tablet 10 mg  10 mg Oral QHS Timmothy Foots, MD       haloperidol  (HALDOL ) tablet 5 mg  5 mg Oral q AM Timmothy Foots, MD       hydrOXYzine  (ATARAX ) tablet 25 mg  25 mg Oral TID PRN Bobbitt, Shalon E, NP   25 mg at 08/12/23 2112   magnesium  hydroxide (MILK OF MAGNESIA)  suspension 30 mL  30 mL Oral Daily PRN Bobbitt, Shalon E, NP       nicotine  (NICODERM CQ  - dosed in mg/24 hours) patch 14 mg  14 mg Transdermal Daily Attiah, Nadir, MD   14 mg at 08/13/23 0736   Oxcarbazepine  (TRILEPTAL ) tablet 300 mg  300 mg Oral Daily Ntuen, Tina C, FNP   300 mg at 08/13/23 0735   traZODone  (DESYREL ) tablet 100 mg  100 mg Oral QHS PRN Ntuen, Tina C, FNP   100 mg at 08/12/23 2112   Vitamin D  (Ergocalciferol ) (DRISDOL ) 1.25 MG (50000 UNIT) capsule 50,000 Units  50,000 Units Oral Daily Azalea Cedar S, MD   50,000 Units at 08/13/23 1610    Lab Results:  No results found for this or any previous visit (from the past 48 hours).  Blood Alcohol level:  Lab Results  Component Value Date   [facility] <15 08/05/2023   ETH <5 10/04/2016   Metabolic Disorder Labs: Lab Results  Component Value Date   HGBA1C 4.8 08/08/2023   MPG 91 08/08/2023   MPG 88 10/06/2016   Lab Results  Component Value Date   PROLACTIN 68.7 (H) 11/26/2014   Lab Results  Component Value Date   CHOL 188 08/08/2023   TRIG 127 08/08/2023   HDL 45 08/08/2023   CHOLHDL 4.2 08/08/2023   VLDL 25 08/08/2023   LDLCALC 118 (H) 08/08/2023   LDLCALC 126 (H) 10/06/2016   Physical Findings: AIMS:  , ,  ,  ,    CIWA:    COWS:     Musculoskeletal: Strength & Muscle Tone: within normal limits Gait & Station: normal Patient leans: N/A  Psychiatric Specialty Exam:  Presentation  General Appearance:  Disheveled  Eye Contact: Good  Speech: Clear and Coherent  Speech Volume: Normal  Handedness: Right  Mood and Affect  Mood: Dysphoric; Anxious  Affect: Restricted; Depressed  Thought Process  Thought Processes: Linear  Descriptions of Associations:Intact  Orientation:Full (Time, Place and Person)  Thought Content:Logical  History of Schizophrenia/Schizoaffective disorder:No data recorded Duration of Psychotic Symptoms:No data recorded Hallucinations:Hallucinations: Visual;  Command Description of Command Hallucinations: Command auditory hallucinations telling her to harm self and  others Description of Auditory Hallucinations: Patient continues to hear voices in her head telling her to harm herself Description of Visual Hallucinations: She reports seeing devils and demons with various size and appearance  Ideas of Reference:None  Suicidal Thoughts:Suicidal Thoughts: Yes, Passive SI Passive Intent and/or Plan: Without Intent  Homicidal Thoughts:Homicidal Thoughts: No  Sensorium  Memory: Immediate Good  Judgment: Poor  Insight: Poor  Executive Functions  Concentration: Fair  Attention Span: Fair  Recall: Good  Fund of Knowledge: Good  Language: Good  Psychomotor Activity  Psychomotor Activity: Psychomotor Activity: Normal  Assets  Assets: Communication Skills; Desire for Improvement  Sleep  Sleep: Sleep: Fair Number of Hours of Sleep: 7.5  Physical Exam: Physical Exam Vitals and nursing note reviewed.  Constitutional:      General: She is not in acute distress.    Appearance: She is not toxic-appearing.  HENT:     Head: Normocephalic.     Right Ear: External ear normal.     Left Ear: External ear normal.     Nose: Nose normal.     Mouth/Throat:     Mouth: Mucous membranes are moist.  Eyes:     Extraocular Movements: Extraocular movements intact.  Cardiovascular:     Rate and Rhythm: Normal rate.     Pulses: Normal pulses.  Pulmonary:     Effort: Pulmonary effort is normal.  Abdominal:     Comments: Deferred  Genitourinary:    Comments: Deferred  Musculoskeletal:        General: Normal range of motion.     Cervical back: Normal range of motion.  Skin:    General: Skin is warm.  Neurological:     General: No focal deficit present.     Mental Status: She is alert and oriented to person, place, and time.  Psychiatric:        Mood and Affect: Mood normal.     Comments: Mood and behavior improving    Review  of Systems  Constitutional:  Negative for chills and fever.  HENT:  Negative for sore throat.   Eyes:  Negative for blurred vision.  Respiratory:  Negative for cough, sputum production, shortness of breath and wheezing.   Cardiovascular:  Negative for chest pain and palpitations.  Gastrointestinal:  Negative for heartburn, nausea and vomiting.  Genitourinary:  Negative for dysuria.  Musculoskeletal:  Negative for myalgias.  Skin:  Negative for itching and rash.  Neurological:  Negative for dizziness and headaches.  Endo/Heme/Allergies:        See allergy listing  Psychiatric/Behavioral:  Positive for depression, hallucinations and suicidal ideas. Negative for substance abuse. The patient is nervous/anxious and has insomnia.    Blood pressure 108/73, pulse 84, temperature 97.9 F (36.6 C), temperature source Oral, resp. rate 18, height 5' (1.524 m), weight 59.3 kg, last menstrual period 04/12/2011, SpO2 99%. Body mass index is 25.55 kg/m.  Treatment Plan Summary: Daily contact with patient to assess and evaluate symptoms and progress in treatment and Medication management  Physician Treatment Plan for Primary Diagnosis: Schizophrenia  Long Term Goal(s): Improvement in symptoms so as ready for discharge   Short Term Goals: Ability to identify changes in lifestyle to reduce recurrence of condition will improve, Ability to verbalize feelings will improve, Ability to disclose and discuss suicidal ideas, Ability to demonstrate self-control will improve, Ability to identify and develop effective coping behaviors will improve, Ability to maintain clinical measurements within normal limits will improve, Compliance with prescribed medications will improve, and  Ability to identify triggers associated with substance abuse/mental health issues will improve   Physician Treatment Plan for Secondary Diagnosis:  Assessment: Valerie Lee is a 48 year old African-American female with prior  psychiatric history significant for MDD recurrent severe without psychotic features, schizoaffective disorder mixed type, depression, and bipolar 1 disorder.  [facility] from [facility] health ED at [facility] for worsening depression resulting in suicidal ideation with overdose intent in the context of  uninformed break-up by the fianc.  The patient reports experiencing auditory and visual hallucinations since the age of 7.    The fact that she has no accompanying delusions, thought disorganization, describes full bodied, clear, colorful demons and devils, and describes hallucinations that are quite organized and coherent, and reports vivid visual hallucinations without any accompanying perceptual disturbance, raises concerns for factitious disorder.  Principal Problem: Schizophrenia, rule out factitious disorder   Plans: Medications: -- Discontinue Zyprexa  15 mg nightly due to patient's concerns for lack of efficacy and start Haldol  5 mg every morning and 10 mg nightly -- Continue Trileptal  tablet from 150 mg to 300 mg p.o. daily for mood stabilization and consider switching to an evidence-based mood stabilizer --Continue gabapentin  capsule 200 mg p.o. twice daily --Continue trazodone  tablet 100 mg daily p.o. q. nightly as needed for insomnia -- Continue hydroxyzine  25 mg p.o. 3 times daily as needed for anxiety.   Other PRN Medications  -Acetaminophen  650 mg every 6 as needed/mild pain  -Maalox 30 mL oral every 4 as needed/digestion  -Magnesium  hydroxide 30 mL daily as needed/mild constipation    --The risks/benefits/side-effects/alternatives to this medication were discussed in detail with the patient and time was given for questions. The patient consents to medication trial.   -- Metabolic profile and EKG monitoring obtained while on an atypical antipsychotic (BMI: Lipid Panel: HbgA1c: QTc:)   -- Encouraged patient to participate in unit  milieu and in scheduled group therapies    Admission labs reviewed: CMP: Potassium level 3.3 replaced at the hospital, BMP ordered.  Otherwise normal.  CBC with differential: WBC 13.3 elevated, RBC 3.77 low, MCV 100.3 elevated, neutrophils 8.8 elevated, otherwise normal.  UDS positive for marijuana.   New labs ordered: BMP: Potassium 3.8 within normal limits, hemoglobin A1c, 4.8 within normal limits, vitamin D  25-hydroxy: 21.39 low, vitamin B12: 226 within normal limits, lipid panel: LDL 118 elevated, TSH: 0.679 within normal limits.   EKG reviewed: Sinus bradycardia, ventricular rate 55, QT/QTc 404/386.   Continue BH Agitation Protocol  --Haldol  5 mg, oral, 3 times daily as needed, mild agitation  --Benadryl  50 mg, oral, 3 times daily as needed, mild agitation                                      OR   --Haldol  injection 5 mg, IM, 3 times daily as needed, moderate agitation  --Benadryl  injection 50 mg, IM, 3 times daily as needed, moderate agitation  --Ativan  injection 2 mg, IM, 3 times daily as needed, moderate agitation                                     OR  --Haldol  injection 10 mg, IM, 3 times daily as needed, severe agitation  --Benadryl  injection 50 mg, IM, 3 times daily as needed, severe agitation  --Ativan   injection 2 mg, IM, 3 times daily as needed, severe agitation    Safety and Monitoring:  Voluntary admission to inpatient psychiatric unit for safety, stabilization and treatment  Daily contact with patient to assess and evaluate symptoms and progress in treatment  Patient's case to be discussed in multi-disciplinary team meeting  Observation Level : q15 minute checks  Vital signs: q12 hours  Precautions: suicide, but pt currently verbally contracts for safety on unit?    Discharge Planning:  Social work and case management to assist with discharge planning and identification of hospital follow-up needs prior to discharge  Estimated LOS: 5-7?days    Discharge Concerns: Need  to establish a safety plan; Medication compliance and effectiveness  Discharge Goals: Return home with outpatient referrals for mental health follow-up including medication management/psychotherapy.    Timmothy Foots, MD 08/13/2023, 11:10 AM

## 2023-08-14 ENCOUNTER — Encounter (HOSPITAL_COMMUNITY): Payer: Self-pay

## 2023-08-14 NOTE — Group Note (Signed)
 Recreation Therapy Group Note   Group Topic:Stress Management  Group Date: 08/14/2023 Start Time: 0935 End Time: 1000 Facilitators: Amatullah Christy-McCall, LRT,CTRS Location: [address] Dayroom   Group Topic: Stress Management   Goal Area(s) Addresses:  Patient will actively participate in stress management techniques presented during session.  Patient will successfully identify benefit of practicing stress management post d/c.   Behavioral Response: Appropriate  Intervention: Relaxation exercise with ambient sound and script   Activity: Guided Imagery. LRT provided education, instruction, and demonstration on practice of visualization via guided imagery. Patient was asked to participate in the technique introduced during session. LRT debriefed including topics of mindfulness, stress management and specific scenarios each patient could use these techniques. Patients were given suggestions of ways to access scripts post d/c and encouraged to explore Youtube and other apps available on smartphones, tablets, and computers.  Education:  Stress Management, Discharge Planning.   Education Outcome: Acknowledges education  Clinical Observations/Feedback: Patient actively engaged in technique introduced, expressed no concerns and demonstrated ability to practice skill independently post d/c.    Affect/Mood: Appropriate   Participation Level: Engaged   Participation Quality: Independent   Behavior: Appropriate   Speech/Thought Process: Focused   Insight: Good   Judgement: Good   Modes of Intervention: Script   Patient Response to Interventions:  Engaged   Education Outcome:  In group clarification offered    Clinical Observations/Individualized Feedback: Pt was focused and engaged during group session. Pt expressed her peaceful place was at church. Pt also stated she almost fell asleep and was relaxed throughout group.     Plan: Continue to engage patient in RT group  sessions 2-3x/week.   Adelfa Lozito-McCall, LRT,CTRS 08/14/2023 12:15 PM

## 2023-08-14 NOTE — Plan of Care (Signed)
  Problem: Education: Goal: Emotional status will improve Outcome: Progressing Goal: Mental status will improve Outcome: Progressing Goal: Verbalization of understanding the information provided will improve Outcome: Progressing   Problem: Safety: Goal: Periods of time without injury will increase Outcome: Progressing   

## 2023-08-14 NOTE — Progress Notes (Signed)
 [facility] MD Progress Note  08/14/2023 12:39 PM Valerie Lee  MRN:  [id number]  Principal Problem: Paranoid schizophrenia (HCC) Diagnosis: Principal Problem:   Paranoid schizophrenia (HCC) Active Problems:   MDD (major depressive disorder), recurrent severe, without psychosis (HCC)   Major depressive disorder, single episode, severe with psychotic features (HCC)  Reason for admission: Valerie Lee is a 48 year old African-American female with prior psychiatric history significant for MDD recurrent severe without psychotic features, schizoaffective disorder mixed type, depression, and bipolar 1 disorder.  Patient presents voluntarily to [facility] from [facility] health ED at [facility] for worsening depression resulting in suicidal ideation with overdose intent, in the context of  uninformed break-up by the fianc.   24-hour chart reviewed: No acute events occurred overnight.  Patient was compliant with psychiatric medications.  She has been visible in the milieu and attending groups.  No detention protocol required.  As needed given include Tylenol  x 2 for moderate pain, hydroxyzine  x 1 for anxiety, and trazodone  x 1 for sleep.  Yesterday the psychiatry team made the following recommendations: -- Discontinue Zyprexa  15 mg nightly due to patient's concerns for lack of efficacy. --Start Haldol  5 mg every morning and 10 mg nightly -- Continue Trileptal  tablet 300 mg p.o. daily for mood stabilization and consider switching to an evidence-based mood stabilizer --Continue gabapentin  capsule 200 mg p.o. twice daily --Continue trazodone  tablet 100 mg daily p.o. q. nightly as needed for insomnia -- Continue hydroxyzine  25 mg p.o. 3 times daily as needed for anxiety.   Today's Assessment Notes: During assessment today, patient was seen and examined sitting up on a chair on the unit.  She presents with pleasant mood and congruent affect.  She is alert, happy,  cooperative, and oriented to person, time, place, and situation.  Patient report that she felt so much better today since medication was changed to another medication possibly Haldol .  She reported her loud auditory hallucination and visual hallucination of seeing demons are much reduced to a minimum.  Patient reiterated that she has been lying to staff because "I do not want people to think I am crazy".  She reports that now she can go to the therapeutic milieu and unit group activities with less disruption from the voices denies and seeing the demons.  Patient is continuing on Haldol  5 mg p.o. daily and 10 mg p.o. at bedtime.  We will continue to monitor for effectiveness.  No delusional thinking during this assessment.  She denies SI or HI, this is great improvement since admission.  Reports that anxiety is at manageable level Sleep is much improved and reports sleeping over 10 hours last night Appetite is better Concentration is improved Energy level is adequate Denies suicidal thoughts.  Further denies suicidal intent and plan.  Denies having any HI.  Denies having psychotic symptoms.   Denies having side effects to current psychiatric medications.   We discussed compliance to level to current medication regimen  Total Time spent with patient: 45 minutes  Past Psychiatric History: Previous Psych Diagnoses: Major depressive disorder without psychosis, schizoaffective disorder mixed type, suicidal ideation Prior inpatient treatment: Yes x 3, last admission in 2018 at [facility] Current/prior outpatient treatment: Report being at [facility] for therapy 1-1/2 years ago Prior rehab hx: Denies Psychotherapy hx: Denies History of suicide: X 1 attempted to run in front of oncoming [employer] transportation bus in 2018 History of homicide or aggression: Denies Psychiatric medication history: Patient has been managed  in the past with Abilify , gabapentin , Lamictal , Latuda , and trazodone  Psychiatric  medication compliance history: Reports noncompliance Neuromodulation history: Denies Current Psychiatrist: Denies Current therapist: Denies  Past Medical History:  Past Medical History:  Diagnosis Date   Anemia    Anxiety    Asthma    rarely uses albuterol  rescue   Bipolar 1 disorder (HCC)    Depression    Paranoia (HCC)    Schizophrenia (HCC)    Schizophrenia (HCC)     Past Surgical History:  Procedure Laterality Date   ABDOMINAL HYSTERECTOMY  05/26/2011   Procedure: HYSTERECTOMY ABDOMINAL;  Surgeon: Verlyn Goad, MD;  Location: [facility];  Service: Gynecology;  Laterality: N/A;   CHOLECYSTECTOMY  2011   SHOULDER ARTHROSCOPY Right 08/13/2020   Procedure: RIGHT SHOULDER ARTHROSCOPY WITH DEBRIDEMENT AND SUBACROMIAL DECOMPRESSION;  Surgeon: Arnie Lao, MD;  Location: [facility];  Service: Orthopedics;  Laterality: Right;   TUBAL LIGATION     TUBAL LIGATION     Family History:  Family History  Problem Relation Age of Onset   Hypertension Mother    Family Psychiatric  History: See H&P Social History:  Social History   Substance and Sexual Activity  Alcohol Use No     Social History   Substance and Sexual Activity  Drug Use Yes   Types: Marijuana    Social History   Socioeconomic History   Marital status: Divorced    Spouse name: Not on file   Number of children: Not on file   Years of education: Not on file   Highest education level: Not on file  Occupational History   Not on file  Tobacco Use   Smoking status: Every Day    Current packs/day: 0.25    Average packs/day: 0.3 packs/day for 24.0 years (6.0 ttl pk-yrs)    Types: Cigarettes   Smokeless tobacco: Never  Vaping Use   Vaping status: Never Used  Substance and Sexual Activity   Alcohol use: No   Drug use: Yes    Types: Marijuana   Sexual activity: Yes    Birth control/protection: Surgical  Other Topics Concern   Not on file  Social History Narrative   Not on file    Social Drivers of Health   Financial Resource Strain: Not on file  Food Insecurity: No Food Insecurity (08/07/2023)   Hunger Vital Sign    Worried About Running Out of Food in the Last Year: Never true    Ran Out of Food in the Last Year: Never true  Transportation Needs: No Transportation Needs (08/07/2023)   PRAPARE - Administrator, Civil Service (Medical): No    Lack of Transportation (Non-Medical): No  Physical Activity: Not on file  Stress: Not on file  Social Connections: Not on file   Additional Social History:    Sleep: Fair  Appetite:  Good  Current Medications: Current Facility-Administered Medications  Medication Dose Route Frequency Provider Last Rate Last Admin   acetaminophen  (TYLENOL ) tablet 650 mg  650 mg Oral Q6H PRN Bobbitt, Shalon E, NP   650 mg at 08/14/23 1138   alum & mag hydroxide-simeth (MAALOX/MYLANTA) 200-200-20 MG/5ML suspension 30 mL  30 mL Oral Q4H PRN Bobbitt, Shalon E, NP       haloperidol  (HALDOL ) tablet 5 mg  5 mg Oral TID PRN Bobbitt, Shalon E, NP   5 mg at 08/09/23 1191   And   diphenhydrAMINE  (BENADRYL ) capsule 50 mg  50 mg Oral TID PRN  Bobbitt, Shalon E, NP   50 mg at 08/09/23 7846   haloperidol  lactate (HALDOL ) injection 5 mg  5 mg Intramuscular TID PRN Bobbitt, Shalon E, NP       And   diphenhydrAMINE  (BENADRYL ) injection 50 mg  50 mg Intramuscular TID PRN Bobbitt, Shalon E, NP       And   LORazepam  (ATIVAN ) injection 2 mg  2 mg Intramuscular TID PRN Bobbitt, Shalon E, NP       haloperidol  lactate (HALDOL ) injection 10 mg  10 mg Intramuscular TID PRN Bobbitt, Shalon E, NP       And   diphenhydrAMINE  (BENADRYL ) injection 50 mg  50 mg Intramuscular TID PRN Bobbitt, Shalon E, NP       And   LORazepam  (ATIVAN ) injection 2 mg  2 mg Intramuscular TID PRN Bobbitt, Shalon E, NP       docusate sodium  (COLACE) capsule 100 mg  100 mg Oral BID Cyara Devoto C, FNP   100 mg at 08/14/23 0801   gabapentin  (NEURONTIN ) capsule 200 mg  200 mg  Oral BID Onuoha, Josephine C, NP   200 mg at 08/14/23 0802   haloperidol  (HALDOL ) tablet 10 mg  10 mg Oral QHS Parker, Alvin S, MD   10 mg at 08/13/23 2129   haloperidol  (HALDOL ) tablet 5 mg  5 mg Oral q AM Parker, Alvin S, MD   5 mg at 08/14/23 0636   hydrOXYzine  (ATARAX ) tablet 25 mg  25 mg Oral TID PRN Bobbitt, Shalon E, NP   25 mg at 08/13/23 2132   magnesium  hydroxide (MILK OF MAGNESIA) suspension 30 mL  30 mL Oral Daily PRN Bobbitt, Shalon E, NP       nicotine  (NICODERM CQ  - dosed in mg/24 hours) patch 14 mg  14 mg Transdermal Daily Attiah, Nadir, MD   14 mg at 08/14/23 0802   Oxcarbazepine  (TRILEPTAL ) tablet 300 mg  300 mg Oral Daily Shalice Woodring C, FNP   300 mg at 08/14/23 0802   traZODone  (DESYREL ) tablet 100 mg  100 mg Oral QHS PRN Evony Rezek C, FNP   100 mg at 08/13/23 2132   Vitamin D  (Ergocalciferol ) (DRISDOL ) 1.25 MG (50000 UNIT) capsule 50,000 Units  50,000 Units Oral Daily Parker, Alvin S, MD   50,000 Units at 08/14/23 0802    Lab Results:  No results found for this or any previous visit (from the past 48 hours).  Blood Alcohol level:  Lab Results  Component Value Date   [facility] <15 08/05/2023   ETH <5 10/04/2016   Metabolic Disorder Labs: Lab Results  Component Value Date   HGBA1C 4.8 08/08/2023   MPG 91 08/08/2023   MPG 88 10/06/2016   Lab Results  Component Value Date   PROLACTIN 68.7 (H) 11/26/2014   Lab Results  Component Value Date   CHOL 188 08/08/2023   TRIG 127 08/08/2023   HDL 45 08/08/2023   CHOLHDL 4.2 08/08/2023   VLDL 25 08/08/2023   LDLCALC 118 (H) 08/08/2023   LDLCALC 126 (H) 10/06/2016   Physical Findings: AIMS:  , ,  ,  ,    CIWA:    COWS:     Musculoskeletal: Strength & Muscle Tone: within normal limits Gait & Station: normal Patient leans: N/A  Psychiatric Specialty Exam:  Presentation  General Appearance:  Appropriate for Environment; Casual  Eye Contact: Good  Speech: Clear and Coherent  Speech  Volume: Normal  Handedness: Right  Mood and Affect  Mood:  Euthymic  Affect: Congruent  Thought Process  Thought Processes: Linear  Descriptions of Associations:Intact  Orientation:Full (Time, Place and Person)  Thought Content:Logical  History of Schizophrenia/Schizoaffective disorder:No data recorded Duration of Psychotic Symptoms:No data recorded Hallucinations:Hallucinations: Auditory; Visual Description of Command Hallucinations: Auditory hallucination minimal Description of Auditory Hallucinations: Hearing voices to harm herself Description of Visual Hallucinations: Visual hallucination minimal  Ideas of Reference:None  Suicidal Thoughts:Suicidal Thoughts: No SI Passive Intent and/or Plan: -- (Denies)  Homicidal Thoughts:Homicidal Thoughts: No  Sensorium  Memory: Immediate Good; Recent Good  Judgment: Fair  Insight: Fair  Art therapist  Concentration: Good  Attention Span: Good  Recall: Good  Fund of Knowledge: Fair  Language: Good  Psychomotor Activity  Psychomotor Activity: Psychomotor Activity: Normal  Assets  Assets: Communication Skills; Desire for Improvement; Physical Health; Resilience  Sleep  Sleep: Sleep: Good Number of Hours of Sleep: 10  Physical Exam: Physical Exam Vitals and nursing note reviewed.  Constitutional:      General: She is not in acute distress.    Appearance: She is not toxic-appearing.  HENT:     Head: Normocephalic.     Right Ear: External ear normal.     Left Ear: External ear normal.     Nose: Nose normal.     Mouth/Throat:     Mouth: Mucous membranes are moist.  Eyes:     Extraocular Movements: Extraocular movements intact.  Cardiovascular:     Rate and Rhythm: Normal rate.     Pulses: Normal pulses.  Pulmonary:     Effort: Pulmonary effort is normal.  Abdominal:     Comments: Deferred  Genitourinary:    Comments: Deferred  Musculoskeletal:        General: Normal range of  motion.     Cervical back: Normal range of motion.  Skin:    General: Skin is warm.  Neurological:     General: No focal deficit present.     Mental Status: She is alert and oriented to person, place, and time.  Psychiatric:        Mood and Affect: Mood normal.        Behavior: Behavior normal.     Comments: Mood and behavior improving    Review of Systems  Constitutional:  Negative for chills and fever.  HENT:  Negative for sore throat.   Eyes:  Negative for blurred vision.  Respiratory:  Negative for cough, sputum production, shortness of breath and wheezing.   Cardiovascular:  Negative for chest pain and palpitations.  Gastrointestinal:  Negative for heartburn, nausea and vomiting.  Genitourinary:  Negative for dysuria.  Musculoskeletal:  Negative for myalgias.  Skin:  Negative for itching and rash.  Neurological:  Negative for dizziness and headaches.  Endo/Heme/Allergies:        See allergy listing  Psychiatric/Behavioral:  Positive for depression and hallucinations. Negative for substance abuse and suicidal ideas. The patient is nervous/anxious. The patient does not have insomnia.    Blood pressure 112/80, pulse 88, temperature 98.8 F (37.1 C), temperature source Oral, resp. rate 16, height 5' (1.524 m), weight 59.3 kg, last menstrual period 04/12/2011, SpO2 100%. Body mass index is 25.55 kg/m.  Treatment Plan Summary: Daily contact with patient to assess and evaluate symptoms and progress in treatment and Medication management  Physician Treatment Plan for Primary Diagnosis: Schizophrenia  Long Term Goal(s): Improvement in symptoms so as ready for discharge   Short Term Goals: Ability to identify changes in lifestyle to reduce recurrence of condition  will improve, Ability to verbalize feelings will improve, Ability to disclose and discuss suicidal ideas, Ability to demonstrate self-control will improve, Ability to identify and develop effective coping behaviors will  improve, Ability to maintain clinical measurements within normal limits will improve, Compliance with prescribed medications will improve, and Ability to identify triggers associated with substance abuse/mental health issues will improve   Physician Treatment Plan for Secondary Diagnosis:  Assessment: Valerie Lee is a 48 year old African-American female with prior psychiatric history significant for MDD recurrent severe without psychotic features, schizoaffective disorder mixed type, depression, and bipolar 1 disorder.  [facility] from [facility] health ED at [facility] for worsening depression resulting in suicidal ideation with overdose intent in the context of  uninformed break-up by the fianc.  The patient reports experiencing auditory and visual hallucinations since the age of 13.    The fact that she has no accompanying delusions, thought disorganization, describes full bodied, clear, colorful demons and devils, and describes hallucinations that are quite organized and coherent, and reports vivid visual hallucinations without any accompanying perceptual disturbance, raises concerns for factitious disorder.  Principal Problem: Schizophrenia, rule out factitious disorder   Plans: Medications: -- Discontinue Zyprexa  15 mg nightly due to patient's concerns for lack of efficacy. --Continue Haldol  5 mg every morning and 10 mg nightly --Continue Trileptal  tablet 300 mg p.o. daily for mood stabilization and consider switching to an evidence-based mood stabilizer --Continue gabapentin  capsule 200 mg p.o. twice daily --Continue trazodone  tablet 100 mg daily p.o. q. nightly as needed for insomnia --Continue hydroxyzine  25 mg p.o. 3 times daily as needed for anxiety.   Other PRN Medications  -Acetaminophen  650 mg every 6 as needed/mild pain  -Maalox 30 mL oral every 4 as needed/digestion  -Magnesium  hydroxide 30 mL daily as needed/mild  constipation    --The risks/benefits/side-effects/alternatives to this medication were discussed in detail with the patient and time was given for questions. The patient consents to medication trial.   -- Metabolic profile and EKG monitoring obtained while on an atypical antipsychotic (BMI: Lipid Panel: HbgA1c: QTc:)   -- Encouraged patient to participate in unit milieu and in scheduled group therapies    Admission labs reviewed: CMP: Potassium level 3.3 replaced at the hospital, BMP ordered.  Otherwise normal.  CBC with differential: WBC 13.3 elevated, RBC 3.77 low, MCV 100.3 elevated, neutrophils 8.8 elevated, otherwise normal.  UDS positive for marijuana.   New labs ordered: BMP: Potassium 3.8 within normal limits, hemoglobin A1c, 4.8 within normal limits, vitamin D  25-hydroxy: 21.39 low, vitamin B12: 226 within normal limits, lipid panel: LDL 118 elevated, TSH: 0.679 within normal limits.   EKG reviewed: Sinus bradycardia, ventricular rate 55, QT/QTc 404/386.   Continue BH Agitation Protocol  --Haldol  5 mg, oral, 3 times daily as needed, mild agitation  --Benadryl  50 mg, oral, 3 times daily as needed, mild agitation                                      OR   --Haldol  injection 5 mg, IM, 3 times daily as needed, moderate agitation  --Benadryl  injection 50 mg, IM, 3 times daily as needed, moderate agitation  --Ativan  injection 2 mg, IM, 3 times daily as needed, moderate agitation  OR  --Haldol  injection 10 mg, IM, 3 times daily as needed, severe agitation  --Benadryl  injection 50 mg, IM, 3 times daily as needed, severe agitation  --Ativan  injection 2 mg, IM, 3 times daily as needed, severe agitation    Safety and Monitoring:  Voluntary admission to inpatient psychiatric unit for safety, stabilization and treatment  Daily contact with patient to assess and evaluate symptoms and progress in treatment  Patient's case to be discussed in multi-disciplinary  team meeting  Observation Level : q15 minute checks  Vital signs: q12 hours  Precautions: suicide, but pt currently verbally contracts for safety on unit?    Discharge Planning:  Social work and case management to assist with discharge planning and identification of hospital follow-up needs prior to discharge  Estimated LOS: 5-7?days    Discharge Concerns: Need to establish a safety plan; Medication compliance and effectiveness  Discharge Goals: Return home with outpatient referrals for mental health follow-up including medication management/psychotherapy.    Laurence Pons, FNP 08/14/2023, 12:39 PM  Patient ID: Valerie Lee, female   DOB: April 26, 1975, 47 y.o.   MRN: [id number]

## 2023-08-14 NOTE — Group Note (Signed)
 Therapy Group Note  Group Topic:Other  Group Date: 08/14/2023 Start Time: 1430 End Time: 1509 Facilitators: Lynnda Sas, OT    The primary objective of this topic is to explore and understand the concept of occupational balance in the context of daily living. The term "occupational balance" is defined broadly, encompassing all activities that occupy an individual's time and energy, including self-care, leisure, and work-related tasks. The goal is to guide participants towards achieving a harmonious blend of these activities, tailored to their personal values and life circumstances. This balance is aimed at enhancing overall well-being, not by equally distributing time across activities, but by ensuring that daily engagements are fulfilling and not draining. The content delves into identifying various barriers that individuals face in achieving occupational balance, such as overcommitment, misaligned priorities, external pressures, and lack of effective time management. The impact of these barriers on occupational performance, roles, and lifestyles is examined, highlighting issues like reduced efficiency, strained relationships, and potential health problems. Strategies for cultivating occupational balance are a key focus. These strategies include practical methods like time blocking, prioritizing tasks, establishing self-care rituals, decluttering, connecting with nature, and engaging in reflective practices. These approaches are designed to be adaptable and applicable to a wide range of life scenarios, promoting a proactive and mindful approach to daily living. The overall aim is to equip participants with the knowledge and tools to create a balanced lifestyle that supports their mental, emotional, and physical health, thereby improving their functional performance in daily life.     Participation Level: Engaged   Participation Quality: Independent   Behavior: Appropriate   Speech/Thought  Process: Relevant   Affect/Mood: Appropriate   Insight: Fair   Judgement: Fair      Modes of Intervention: Education  Patient Response to Interventions:  Attentive   Plan: Continue to engage patient in OT groups 2 - 3x/week.  08/14/2023  Lynnda Sas, OT  Bradden Tadros, OT

## 2023-08-14 NOTE — Progress Notes (Signed)
 Adult Psychoeducational Group Note  Date:  08/14/2023 Time:  8:44 PM  Group Topic/Focus:  Wrap-Up Group:   The focus of this group is to help patients review their daily goal of treatment and discuss progress on daily workbooks.  Participation Level:  Active  Participation Quality:  Appropriate  Affect:  Appropriate  Cognitive:  Appropriate  Insight: Appropriate  Engagement in Group:  Engaged  Modes of Intervention:  Discussion  Additional Comments:  attend wrap up AA group.  Valerie Lee 08/14/2023, 8:44 PM

## 2023-08-14 NOTE — [facility] Group Notes (Signed)
 Adult Psychoeducational Group Note  Date:  08/14/2023 Time:  11:02 AM  Group Topic/Focus:  Goals Group:   The focus of this group is to help patients establish daily goals to achieve during treatment and discuss how the patient can incorporate goal setting into their daily lives to aide in recovery.  Participation Level:  Active  Participation Quality:  Appropriate and Attentive  Affect:  Appropriate  Cognitive:  Appropriate  Insight: Appropriate  Engagement in Group:  Engaged  Modes of Intervention:  Exploration  Additional Comments:  Pt participated in goals group. Pt stated her goal is to shower less, decreasing from seven showers a day to possibly 3. Pt identified no signs of SI/HI  Alean Amen 08/14/2023, 11:02 AM

## 2023-08-14 NOTE — [facility] Group Notes (Deleted)
 Adult Psychoeducational Group Note  Date:  08/14/2023 Time:  10:46 AM  Group Topic/Focus:  Goals Group:   The focus of this group is to help patients establish daily goals to achieve during treatment and discuss how the patient can incorporate goal setting into their daily lives to aide in recovery.  Participation Level:  Did Not Attend  Participation Quality:  na  Affect:  na  Cognitive:  na  Insight: na  Engagement in Group:  na  Modes of Intervention:  na  Additional Comments:  Pt did not attend group  Alean Amen 08/14/2023, 10:46 AM

## 2023-08-14 NOTE — Progress Notes (Signed)
   08/13/23 2200  Psych Admission Type (Psych Patients Only)  Admission Status Voluntary  Psychosocial Assessment  Patient Complaints Anxiety;Depression (anxiety 6/10, depression 6/10)  Eye Contact Fair  Facial Expression Anxious  Affect Anxious  Speech Logical/coherent  Interaction Assertive  Motor Activity Slow  Appearance/Hygiene Unremarkable  Behavior Characteristics Cooperative;Anxious  Mood Anxious;Pleasant  Thought Process  Coherency WDL  Content WDL  Delusions None reported or observed  Perception Hallucinations  Hallucination Auditory  Judgment Impaired  Confusion None  Danger to Self  Current suicidal ideation? Denies  Self-Injurious Behavior No self-injurious ideation or behavior indicators observed or expressed   Agreement Not to Harm Self Yes  Description of Agreement verbal  Danger to Others  Danger to Others None reported or observed

## 2023-08-14 NOTE — Plan of Care (Signed)
   Problem: Education: Goal: Emotional status will improve Outcome: Progressing Goal: Mental status will improve Outcome: Progressing   Problem: Activity: Goal: Interest or engagement in activities will improve Outcome: Progressing Goal: Sleeping patterns will improve Outcome: Progressing   Problem: Safety: Goal: Periods of time without injury will increase Outcome: Progressing

## 2023-08-14 NOTE — Progress Notes (Signed)
   08/14/23 1000  Psych Admission Type (Psych Patients Only)  Admission Status Voluntary  Psychosocial Assessment  Patient Complaints Other (Comment) (Pt denied SI/HI; endorses [facility] of demons but not as bad as it's been. AH)  Eye Contact Fair  Facial Expression Animated  Affect Appropriate to circumstance  Speech Logical/coherent  Interaction Assertive  Appearance/Hygiene Unremarkable  Behavior Characteristics Cooperative  Mood Pleasant;Euthymic  Thought Process  Coherency WDL  Content WDL  Delusions None reported or observed  Perception Hallucinations  Hallucination Auditory;Visual  Judgment Impaired  Confusion None  Danger to Self  Current suicidal ideation? Denies  Self-Injurious Behavior No self-injurious ideation or behavior indicators observed or expressed   Agreement Not to Harm Self Yes  Description of Agreement Verbal  Danger to Others  Danger to Others None reported or observed

## 2023-08-14 NOTE — BH IP Treatment Plan (Signed)
 Interdisciplinary Treatment and Diagnostic Plan Update  08/14/2023 Time of Session: 1:05 PM - UPDATE Valerie Lee MRN: [id number]  Principal Diagnosis: Paranoid schizophrenia (HCC)  Secondary Diagnoses: Principal Problem:   Paranoid schizophrenia (HCC) Active Problems:   MDD (major depressive disorder), recurrent severe, without psychosis (HCC)   Major depressive disorder, single episode, severe with psychotic features (HCC)   Current Medications:  Current Facility-Administered Medications  Medication Dose Route Frequency Provider Last Rate Last Admin   acetaminophen  (TYLENOL ) tablet 650 mg  650 mg Oral Q6H PRN Bobbitt, Shalon E, NP   650 mg at 08/14/23 1138   alum & mag hydroxide-simeth (MAALOX/MYLANTA) 200-200-20 MG/5ML suspension 30 mL  30 mL Oral Q4H PRN Bobbitt, Shalon E, NP       haloperidol  (HALDOL ) tablet 5 mg  5 mg Oral TID PRN Bobbitt, Shalon E, NP   5 mg at 08/09/23 4098   And   diphenhydrAMINE  (BENADRYL ) capsule 50 mg  50 mg Oral TID PRN Bobbitt, Shalon E, NP   50 mg at 08/09/23 1191   haloperidol  lactate (HALDOL ) injection 5 mg  5 mg Intramuscular TID PRN Bobbitt, Shalon E, NP       And   diphenhydrAMINE  (BENADRYL ) injection 50 mg  50 mg Intramuscular TID PRN Bobbitt, Shalon E, NP       And   LORazepam  (ATIVAN ) injection 2 mg  2 mg Intramuscular TID PRN Bobbitt, Shalon E, NP       haloperidol  lactate (HALDOL ) injection 10 mg  10 mg Intramuscular TID PRN Bobbitt, Shalon E, NP       And   diphenhydrAMINE  (BENADRYL ) injection 50 mg  50 mg Intramuscular TID PRN Bobbitt, Shalon E, NP       And   LORazepam  (ATIVAN ) injection 2 mg  2 mg Intramuscular TID PRN Bobbitt, Shalon E, NP       docusate sodium  (COLACE) capsule 100 mg  100 mg Oral BID Ntuen, Tina C, FNP   100 mg at 08/14/23 0801   gabapentin  (NEURONTIN ) capsule 200 mg  200 mg Oral BID Onuoha, Josephine C, NP   200 mg at 08/14/23 0802   haloperidol  (HALDOL ) tablet 10 mg  10 mg Oral QHS Parker, Alvin S, MD   10 mg  at 08/13/23 2129   haloperidol  (HALDOL ) tablet 5 mg  5 mg Oral q AM Parker, Alvin S, MD   5 mg at 08/14/23 0636   hydrOXYzine  (ATARAX ) tablet 25 mg  25 mg Oral TID PRN Bobbitt, Shalon E, NP   25 mg at 08/13/23 2132   magnesium  hydroxide (MILK OF MAGNESIA) suspension 30 mL  30 mL Oral Daily PRN Bobbitt, Shalon E, NP       nicotine  (NICODERM CQ  - dosed in mg/24 hours) patch 14 mg  14 mg Transdermal Daily Attiah, Nadir, MD   14 mg at 08/14/23 0802   traZODone  (DESYREL ) tablet 100 mg  100 mg Oral QHS PRN Ntuen, Tina C, FNP   100 mg at 08/13/23 2132   Vitamin D  (Ergocalciferol ) (DRISDOL ) 1.25 MG (50000 UNIT) capsule 50,000 Units  50,000 Units Oral Daily Parker, Alvin S, MD   50,000 Units at 08/14/23 0802   PTA Medications: Medications Prior to Admission  Medication Sig Dispense Refill Last Dose/Taking   ARIPiprazole  (ABILIFY ) 10 MG tablet Take 1 tablet (10 mg total) by mouth at bedtime. For mood stabilization (Patient not taking: Reported on 08/05/2023) 30 tablet 0    benzonatate  (TESSALON ) 100 MG capsule Take 1 capsule (100  mg total) by mouth 3 (three) times daily as needed for cough. (Patient not taking: Reported on 01/12/2023) 21 capsule 0    ferrous sulfate 325 (65 FE) MG tablet Take 325 mg by mouth daily with breakfast.      furosemide (LASIX) 20 MG tablet Take 20 mg by mouth daily as needed for edema.      gabapentin  (NEURONTIN ) 100 MG capsule Take 100 mg by mouth at bedtime.      gabapentin  (NEURONTIN ) 400 MG capsule Take 1 capsule (400 mg total) by mouth 2 (two) times daily for 14 days. (Patient not taking: Reported on 08/05/2023) 28 capsule 0    lamoTRIgine  (LAMICTAL ) 25 MG tablet Take 1 tablet (25 mg total) by mouth daily. For mood stabilization (Patient not taking: Reported on 01/12/2023) 30 tablet 0    lidocaine  (XYLOCAINE ) 2 % solution Use as directed 15 mLs in the mouth or throat as needed for mouth pain. (Patient not taking: Reported on 08/05/2023) 100 mL 0    lurasidone  (LATUDA ) 40 MG TABS  tablet Take 40 mg by mouth daily with breakfast.      metoCLOPramide  (REGLAN ) 10 MG tablet Take 1 tablet (10 mg total) by mouth every 6 (six) hours as needed for nausea (nausea/headache). 10 tablet 0    naproxen  (NAPROSYN ) 500 MG tablet Take 1 tablet (500 mg total) by mouth 2 (two) times daily. (Patient not taking: Reported on 08/05/2023) 30 tablet 0    ondansetron  (ZOFRAN -ODT) 4 MG disintegrating tablet Take 1 tablet (4 mg total) by mouth every 8 (eight) hours as needed for nausea or vomiting. (Patient not taking: Reported on 08/05/2023) 20 tablet 0    tiZANidine  (ZANAFLEX ) 4 MG tablet Take 1 tablet (4 mg total) by mouth every 8 (eight) hours as needed for muscle spasms. 30 tablet 0    traZODone  (DESYREL ) 50 MG tablet Take 1 tablet (50 mg total) by mouth at bedtime as needed for sleep. (Patient not taking: Reported on 08/05/2023) 30 tablet 0     Patient Stressors: Marital or family conflict   Substance abuse    Patient Strengths: Ability for insight   Treatment Modalities: Medication Management, Group therapy, Case management,  1 to 1 session with clinician, Psychoeducation, Recreational therapy.   Physician Treatment Plan for Primary Diagnosis: Paranoid schizophrenia (HCC) Long Term Goal(s): Improvement in symptoms so as ready for discharge   Short Term Goals: Ability to identify changes in lifestyle to reduce recurrence of condition will improve Ability to verbalize feelings will improve Ability to disclose and discuss suicidal ideas Ability to demonstrate self-control will improve Ability to identify and develop effective coping behaviors will improve Ability to maintain clinical measurements within normal limits will improve Compliance with prescribed medications will improve Ability to identify triggers associated with substance abuse/mental health issues will improve  Medication Management: Evaluate patient's response, side effects, and tolerance of medication regimen.  Therapeutic  Interventions: 1 to 1 sessions, Unit Group sessions and Medication administration.  Evaluation of Outcomes: Progressing  Physician Treatment Plan for Secondary Diagnosis: Principal Problem:   Paranoid schizophrenia (HCC) Active Problems:   MDD (major depressive disorder), recurrent severe, without psychosis (HCC)   Major depressive disorder, single episode, severe with psychotic features (HCC)  Long Term Goal(s): Improvement in symptoms so as ready for discharge   Short Term Goals: Ability to identify changes in lifestyle to reduce recurrence of condition will improve Ability to verbalize feelings will improve Ability to disclose and discuss suicidal ideas Ability to demonstrate self-control  will improve Ability to identify and develop effective coping behaviors will improve Ability to maintain clinical measurements within normal limits will improve Compliance with prescribed medications will improve Ability to identify triggers associated with substance abuse/mental health issues will improve     Medication Management: Evaluate patient's response, side effects, and tolerance of medication regimen.  Therapeutic Interventions: 1 to 1 sessions, Unit Group sessions and Medication administration.  Evaluation of Outcomes: Progressing   RN Treatment Plan for Primary Diagnosis: Paranoid schizophrenia (HCC) Long Term Goal(s): Knowledge of disease and therapeutic regimen to maintain health will improve  Short Term Goals: Ability to remain free from injury will improve, Ability to verbalize frustration and anger appropriately will improve, Ability to verbalize feelings will improve, and Ability to disclose and discuss suicidal ideas  Medication Management: RN will administer medications as ordered by provider, will assess and evaluate patient's response and provide education to patient for prescribed medication. RN will report any adverse and/or side effects to prescribing  provider.  Therapeutic Interventions: 1 on 1 counseling sessions, Psychoeducation, Medication administration, Evaluate responses to treatment, Monitor vital signs and CBGs as ordered, Perform/monitor CIWA, COWS, AIMS and Fall Risk screenings as ordered, Perform wound care treatments as ordered.  Evaluation of Outcomes: Progressing   LCSW Treatment Plan for Primary Diagnosis: Paranoid schizophrenia (HCC) Long Term Goal(s): Safe transition to appropriate next level of care at discharge, Engage patient in therapeutic group addressing interpersonal concerns.  Short Term Goals: Engage patient in aftercare planning with referrals and resources, Increase ability to appropriately verbalize feelings, Facilitate acceptance of mental health diagnosis and concerns, and Identify triggers associated with mental health/substance abuse issues  Therapeutic Interventions: Assess for all discharge needs, 1 to 1 time with Social worker, Explore available resources and support systems, Assess for adequacy in community support network, Educate family and significant other(s) on suicide prevention, Complete Psychosocial Assessment, Interpersonal group therapy.  Evaluation of Outcomes: Progressing   Progress in Treatment: Attending groups: Yes. Participating in groups: Yes. Taking medication as prescribed: Yes. Toleration medication: Yes. Family/Significant other contact made: Yes, individual(s) contacted:  Janee Mech (daughter) ONLY SPE, no collateral - [phone] Patient understands diagnosis: Yes. Discussing patient identified problems/goals with staff: Yes. Medical problems stabilized or resolved: Yes. Denies suicidal/homicidal ideation: Yes. Issues/concerns per patient self-inventory: No.   New problem(s) identified: No, Describe:  none   New Short Term/Long Term Goal(s): medication stabilization, elimination of SI thoughts, development of comprehensive mental wellness plan.     Patient Goals:   "Lessen my mood swings, get on medication for the voices, get my soul back and not hurt myself'   Discharge Plan or Barriers: Patient recently admitted. CSW will continue to follow and assess for appropriate referrals and possible discharge planning.      Reason for Continuation of Hospitalization: Depression Hallucinations Medication stabilization Suicidal ideation   Estimated Length of Stay: 4 - 6 days  Last 3 [place] Suicide Severity Risk Score: Flowsheet Row [facility] (Current) [facility]05/2023 in [facility] from 01/12/2023 [facility] RISK CATEGORY High Risk High Risk No Risk       Last PHQ 2/9 Scores:     No data to display          Scribe for Treatment Team: Kiona Blume O Itsel Opfer, LCSWA 08/14/2023 4:06 PM

## 2023-08-15 MED ORDER — HALOPERIDOL 5 MG PO TABS
15.0000 mg | ORAL_TABLET | Freq: Every day | ORAL | Status: AC
  Administered 2023-08-15: 15 mg via ORAL

## 2023-08-15 MED ORDER — HALOPERIDOL 5 MG PO TABS
20.0000 mg | ORAL_TABLET | Freq: Every day | ORAL | Status: DC
Start: 2023-08-16 — End: 2023-08-17
  Administered 2023-08-16: 20 mg via ORAL
  Filled 2023-08-15: qty 4

## 2023-08-15 NOTE — Group Note (Signed)
 Date:  08/15/2023 Time:  9:43 PM  Group Topic/Focus:  Wrap-Up Group:   The focus of this group is to help patients review their daily goal of treatment and discuss progress on daily workbooks.    Additional Comments:  Pt was encouraged, but opted out of attending wrap up group this evening.   Valerie Lee 08/15/2023, 9:43 PM

## 2023-08-15 NOTE — Progress Notes (Signed)
   08/14/23 2200  Psych Admission Type (Psych Patients Only)  Admission Status Voluntary  Psychosocial Assessment  Eye Contact Fair  Facial Expression Anxious  Affect Anxious  Speech Logical/coherent  Interaction Assertive  Motor Activity Slow  Appearance/Hygiene Unremarkable  Behavior Characteristics Cooperative  Mood Pleasant  Thought Process  Coherency WDL  Content WDL  Delusions None reported or observed  Perception Hallucinations  Hallucination Auditory  Judgment Impaired  Confusion None  Danger to Self  Current suicidal ideation? Denies  Self-Injurious Behavior No self-injurious ideation or behavior indicators observed or expressed   Agreement Not to Harm Self Yes  Description of Agreement verbal  Danger to Others  Danger to Others None reported or observed

## 2023-08-15 NOTE — Group Note (Signed)
 LCSW Group Therapy Note   Group Date: 08/15/2023 Start Time: 1100 End Time: 1200   Participation:  did not attend  Type of Therapy:  Group Therapy  Topic:  "Healing Flames: Navigating Anger with Compassion"  Objective:  Foster self-awareness and promote compassion toward oneself and others when dealing with anger.  Goals:  Help participants understand the underlying emotions and needs fueling anger. Provide coping strategies for healthier emotional expression and anger management.  Summary: This session explored anger as a volcano--an explosion driven by deeper feelings and unmet needs. Participants learned to identify anger triggers and underlying emotions, then practiced coping strategies like deep breathing, physical activity, and journaling. The group discussed healthy ways to manage anger before it escalates, using both personal reflection and shared experiences.  Therapeutic Modalities: Cognitive Behavioral Therapy (CBT): Challenging thoughts that fuel anger. Mindfulness: Increasing awareness of emotions and sensations.   Ron Junco O Hannah Strader, LCSWA 08/15/2023  12:53 PM

## 2023-08-15 NOTE — Progress Notes (Signed)
   08/15/23 2300  Psych Admission Type (Psych Patients Only)  Admission Status Voluntary  Psychosocial Assessment  Patient Complaints Depression  Eye Contact Fair  Facial Expression Anxious  Affect Appropriate to circumstance  Speech Logical/coherent  Interaction Assertive  Motor Activity Slow  Appearance/Hygiene Unremarkable  Behavior Characteristics Cooperative  Mood Anxious;Pleasant  Thought Process  Coherency WDL  Content WDL  Delusions None reported or observed  Perception Hallucinations  Hallucination Auditory  Judgment Impaired  Confusion None  Danger to Self  Current suicidal ideation? Denies  Self-Injurious Behavior No self-injurious ideation or behavior indicators observed or expressed   Agreement Not to Harm Self Yes  Description of Agreement verbal  Danger to Others  Danger to Others None reported or observed

## 2023-08-15 NOTE — Group Note (Signed)
 Recreation Therapy Group Note   Group Topic:Animal Assisted Therapy   Group Date: 08/15/2023 Start Time: 0945 End Time: 1030 Facilitators: Contessa Preuss-McCall, LRT,CTRS Location: [address] Dayroom   Animal-Assisted Activity (AAA) Program Checklist/Progress Notes Patient Eligibility Criteria Checklist & Daily Group note for Rec Tx Intervention  AAA/T Program Assumption of Risk Form signed by Patient/ or Parent Legal Guardian Yes  Patient is free of allergies or severe asthma Yes  Patient reports no fear of animals Yes  Patient reports no history of cruelty to animals Yes  Patient understands his/her participation is voluntary Yes  Patient washes hands before animal contact Yes  Patient washes hands after animal contact Yes  Behavioral Response: Appropriate   Education: Hand Washing, Appropriate Animal Interaction   Education Outcome: Acknowledges education.    Affect/Mood: Appropriate   Participation Level: Engaged   Participation Quality: Independent   Behavior: Appropriate   Speech/Thought Process: Focused   Insight: Good   Judgement: Good   Modes of Intervention: Teaching laboratory technician   Patient Response to Interventions:  Engaged   Education Outcome:  In group clarification offered    Clinical Observations/Individualized Feedback: Patient attended session and interacted appropriately with therapy dog and peers. Patient asked appropriate questions about therapy dog and his training. Patient shared stories about their pets at home with group.    Plan: Continue to engage patient in RT group sessions 2-3x/week.   Valerie Lee, LRT,CTRS 08/15/2023 1:24 PM

## 2023-08-15 NOTE — Plan of Care (Signed)
  Problem: Education: Goal: Emotional status will improve Outcome: Progressing Goal: Mental status will improve Outcome: Progressing   Problem: Coping: Goal: Ability to verbalize frustrations and anger appropriately will improve Outcome: Progressing Goal: Ability to demonstrate self-control will improve Outcome: Progressing   

## 2023-08-15 NOTE — Progress Notes (Signed)
   08/15/23 0845  Psych Admission Type (Psych Patients Only)  Admission Status Voluntary  Psychosocial Assessment  Patient Complaints None  Eye Contact Fair  Facial Expression Anxious  Affect Appropriate to circumstance  Speech Logical/coherent  Interaction Assertive  Motor Activity Other (Comment) (WNL)  Appearance/Hygiene Unremarkable  Behavior Characteristics Cooperative  Mood Pleasant  Thought Process  Coherency WDL  Content WDL  Delusions None reported or observed  Perception Hallucinations  Hallucination Auditory  Judgment Impaired  Confusion None  Danger to Self  Current suicidal ideation? Denies  Self-Injurious Behavior No self-injurious ideation or behavior indicators observed or expressed   Agreement Not to Harm Self Yes  Description of Agreement verbal  Danger to Others  Danger to Others None reported or observed

## 2023-08-15 NOTE — Progress Notes (Signed)
 [facility] MD Progress Note  08/15/2023 1:17 PM Valerie Lee  MRN:  [id number]  Principal Problem: Paranoid schizophrenia (HCC) Diagnosis: Principal Problem:   Paranoid schizophrenia (HCC) Active Problems:   MDD (major depressive disorder), recurrent severe, without psychosis (HCC)   Major depressive disorder, single episode, severe with psychotic features (HCC)  Reason for admission: Valerie Lee is a 48 year old African-American female with prior psychiatric history significant for MDD recurrent severe without psychotic features, schizoaffective disorder mixed type, depression, and bipolar 1 disorder.  Patient presents voluntarily to [facility] from [facility] health ED at [facility] for worsening depression resulting in suicidal ideation with overdose intent, in the context of  uninformed break-up by the fianc.   24-hour chart reviewed: No acute events occurred overnight.  Patient was compliant with psychiatric medications.  She has been visible in the milieu and attending groups.  No detention protocol required.  As needed given include Tylenol  x 1 for moderate pain, hydroxyzine  x 1 for anxiety, and trazodone  x 1 for sleep.   Today's Assessment Notes: Valerie Lee presents with pleasant mood and congruent affect.  She is alert, happy, cooperative, and oriented to person, time, place, and situation.  She reports that auditory hallucination and visual hallucination of seeing demons are much reduced to a minimum. Reports she is doing well and more organized. Observed attending and participating in therapeutic milieu and unit group activities without disruption from auditory and visual hallucinations.  Continues on Haldol  which is consolidated to 15 mg p.o. q. nightly for psychosis.  No EPS or cogwheel rigidity observed during assessment.  We will continue to monitor for effectiveness. No delusional thinking during this assessment.  She denies SI or HI.  Reports that  anxiety is at manageable level Sleep is much improved and reports sleeping over 8.75 hours last night Appetite is better Concentration is improved Energy level is adequate Denies suicidal thoughts.  Further denies suicidal intent and plan.  Denies having any HI.   Denies having side effects to current psychiatric medications.   We discussed compliance to level to current medication regimen  Total Time spent with patient: 45 minutes  Past Psychiatric History: Previous Psych Diagnoses: Major depressive disorder without psychosis, schizoaffective disorder mixed type, suicidal ideation Prior inpatient treatment: Yes x 3, last admission in 2018 at [facility] Current/prior outpatient treatment: Report being at [facility] for therapy 1-1/2 years ago Prior rehab hx: Denies Psychotherapy hx: Denies History of suicide: X 1 attempted to run in front of oncoming [place] transportation bus in 2018 History of homicide or aggression: Denies Psychiatric medication history: Patient has been managed in the past with Abilify , gabapentin , Lamictal , Latuda , and trazodone  Psychiatric medication compliance history: Reports noncompliance Neuromodulation history: Denies Current Psychiatrist: Denies Current therapist: Denies  Past Medical History:  Past Medical History:  Diagnosis Date   Anemia    Anxiety    Asthma    rarely uses albuterol  rescue   Bipolar 1 disorder (HCC)    Depression    Paranoia (HCC)    Schizophrenia (HCC)    Schizophrenia (HCC)     Past Surgical History:  Procedure Laterality Date   ABDOMINAL HYSTERECTOMY  05/26/2011   Procedure: HYSTERECTOMY ABDOMINAL;  Surgeon: Verlyn Goad, MD;  Location: [facility];  Service: Gynecology;  Laterality: N/A;   CHOLECYSTECTOMY  2011   SHOULDER ARTHROSCOPY Right 08/13/2020   Procedure: RIGHT SHOULDER ARTHROSCOPY WITH DEBRIDEMENT AND SUBACROMIAL DECOMPRESSION;  Surgeon: Arnie Lao, MD;  Location: [facility];  Service:  Orthopedics;  Laterality: Right;   TUBAL LIGATION     TUBAL LIGATION     Family History:  Family History  Problem Relation Age of Onset   Hypertension Mother    Family Psychiatric  History: See H&P Social History:  Social History   Substance and Sexual Activity  Alcohol Use No     Social History   Substance and Sexual Activity  Drug Use Yes   Types: Marijuana    Social History   Socioeconomic History   Marital status: Divorced    Spouse name: Not on file   Number of children: Not on file   Years of education: Not on file   Highest education level: Not on file  Occupational History   Not on file  Tobacco Use   Smoking status: Every Day    Current packs/day: 0.25    Average packs/day: 0.3 packs/day for 24.0 years (6.0 ttl pk-yrs)    Types: Cigarettes   Smokeless tobacco: Never  Vaping Use   Vaping status: Never Used  Substance and Sexual Activity   Alcohol use: No   Drug use: Yes    Types: Marijuana   Sexual activity: Yes    Birth control/protection: Surgical  Other Topics Concern   Not on file  Social History Narrative   Not on file   Social Drivers of Health   Financial Resource Strain: Not on file  Food Insecurity: No Food Insecurity (08/07/2023)   Hunger Vital Sign    Worried About Running Out of Food in the Last Year: Never true    Ran Out of Food in the Last Year: Never true  Transportation Needs: No Transportation Needs (08/07/2023)   PRAPARE - Administrator, Civil Service (Medical): No    Lack of Transportation (Non-Medical): No  Physical Activity: Not on file  Stress: Not on file  Social Connections: Not on file   Additional Social History:    Sleep: Fair  Appetite:  Good  Current Medications: Current Facility-Administered Medications  Medication Dose Route Frequency Provider Last Rate Last Admin   acetaminophen  (TYLENOL ) tablet 650 mg  650 mg Oral Q6H PRN Bobbitt, Shalon E, NP   650 mg at 08/14/23 2142   alum & mag  hydroxide-simeth (MAALOX/MYLANTA) 200-200-20 MG/5ML suspension 30 mL  30 mL Oral Q4H PRN Bobbitt, Shalon E, NP       haloperidol  (HALDOL ) tablet 5 mg  5 mg Oral TID PRN Bobbitt, Shalon E, NP   5 mg at 08/09/23 1610   And   diphenhydrAMINE  (BENADRYL ) capsule 50 mg  50 mg Oral TID PRN Bobbitt, Shalon E, NP   50 mg at 08/09/23 9604   haloperidol  lactate (HALDOL ) injection 5 mg  5 mg Intramuscular TID PRN Bobbitt, Shalon E, NP       And   diphenhydrAMINE  (BENADRYL ) injection 50 mg  50 mg Intramuscular TID PRN Bobbitt, Shalon E, NP       And   LORazepam  (ATIVAN ) injection 2 mg  2 mg Intramuscular TID PRN Bobbitt, Shalon E, NP       haloperidol  lactate (HALDOL ) injection 10 mg  10 mg Intramuscular TID PRN Bobbitt, Shalon E, NP       And   diphenhydrAMINE  (BENADRYL ) injection 50 mg  50 mg Intramuscular TID PRN Bobbitt, Shalon E, NP       And   LORazepam  (ATIVAN ) injection 2 mg  2 mg Intramuscular TID PRN Bobbitt, Shalon E, NP  docusate sodium  (COLACE) capsule 100 mg  100 mg Oral BID Juliannah Ohmann C, FNP   100 mg at 08/15/23 0981   gabapentin  (NEURONTIN ) capsule 200 mg  200 mg Oral BID Onuoha, Josephine C, NP   200 mg at 08/15/23 1914   haloperidol  (HALDOL ) tablet 15 mg  15 mg Oral QHS Sharilyn Geisinger C, FNP       hydrOXYzine  (ATARAX ) tablet 25 mg  25 mg Oral TID PRN Bobbitt, Shalon E, NP   25 mg at 08/14/23 2142   magnesium  hydroxide (MILK OF MAGNESIA) suspension 30 mL  30 mL Oral Daily PRN Bobbitt, Shalon E, NP       nicotine  (NICODERM CQ  - dosed in mg/24 hours) patch 14 mg  14 mg Transdermal Daily Attiah, Nadir, MD   14 mg at 08/15/23 0806   traZODone  (DESYREL ) tablet 100 mg  100 mg Oral QHS PRN Rutilio Yellowhair C, FNP   100 mg at 08/14/23 2142   Vitamin D  (Ergocalciferol ) (DRISDOL ) 1.25 MG (50000 UNIT) capsule 50,000 Units  50,000 Units Oral Daily Parker, Alvin S, MD   50,000 Units at 08/15/23 0806    Lab Results:  No results found for this or any previous visit (from the past 48 hours).  Blood  Alcohol level:  Lab Results  Component Value Date   [facility] <15 08/05/2023   ETH <5 10/04/2016   Metabolic Disorder Labs: Lab Results  Component Value Date   HGBA1C 4.8 08/08/2023   MPG 91 08/08/2023   MPG 88 10/06/2016   Lab Results  Component Value Date   PROLACTIN 68.7 (H) 11/26/2014   Lab Results  Component Value Date   CHOL 188 08/08/2023   TRIG 127 08/08/2023   HDL 45 08/08/2023   CHOLHDL 4.2 08/08/2023   VLDL 25 08/08/2023   LDLCALC 118 (H) 08/08/2023   LDLCALC 126 (H) 10/06/2016   Physical Findings: AIMS:  , ,  ,  ,    CIWA:    COWS:     Musculoskeletal: Strength & Muscle Tone: within normal limits Gait & Station: normal Patient leans: N/A  Psychiatric Specialty Exam:  Presentation  General Appearance:  Appropriate for Environment; Casual; Fairly Groomed  Eye Contact: Good  Speech: Clear and Coherent  Speech Volume: Normal  Handedness: Right  Mood and Affect  Mood: Euthymic  Affect: Congruent  Thought Process  Thought Processes: Coherent  Descriptions of Associations:Intact  Orientation:Full (Time, Place and Person)  Thought Content:Logical  History of Schizophrenia/Schizoaffective disorder:No data recorded Duration of Psychotic Symptoms:No data recorded Hallucinations:Hallucinations: Auditory; Visual (AH and VH minimal) Description of Command Hallucinations: Auditory hallucination is minimal Description of Auditory Hallucinations: Voices are not in whispers Description of Visual Hallucinations: Visual hallucination is minimal  Ideas of Reference:None  Suicidal Thoughts:Suicidal Thoughts: No SI Passive Intent and/or Plan: -- (Denies)  Homicidal Thoughts:Homicidal Thoughts: No  Sensorium  Memory: Immediate Good; Recent Good  Judgment: Good  Insight: Good  Executive Functions  Concentration: Good  Attention Span: Good  Recall: Good  Fund of Knowledge: Fair  Language: Good  Psychomotor Activity   Psychomotor Activity: Psychomotor Activity: Normal  Assets  Assets: Communication Skills; Physical Health; Resilience  Sleep  Sleep: Sleep: Good Number of Hours of Sleep: 8.75  Physical Exam: Physical Exam Vitals and nursing note reviewed.  Constitutional:      General: She is not in acute distress.    Appearance: She is not toxic-appearing.  HENT:     Head: Normocephalic.     Right Ear:  External ear normal.     Left Ear: External ear normal.     Nose: Nose normal.     Mouth/Throat:     Mouth: Mucous membranes are moist.  Eyes:     Extraocular Movements: Extraocular movements intact.  Cardiovascular:     Rate and Rhythm: Normal rate.     Pulses: Normal pulses.  Pulmonary:     Effort: Pulmonary effort is normal.  Abdominal:     Comments: Deferred  Genitourinary:    Comments: Deferred  Musculoskeletal:        General: Normal range of motion.     Cervical back: Normal range of motion.  Skin:    General: Skin is warm.  Neurological:     General: No focal deficit present.     Mental Status: She is alert and oriented to person, place, and time.  Psychiatric:        Mood and Affect: Mood normal.        Behavior: Behavior normal.     Comments: Mood and behavior improving    Review of Systems  Constitutional:  Negative for chills and fever.  HENT:  Negative for sore throat.   Eyes:  Negative for blurred vision.  Respiratory:  Negative for cough, sputum production, shortness of breath and wheezing.   Cardiovascular:  Negative for chest pain and palpitations.  Gastrointestinal:  Negative for heartburn, nausea and vomiting.  Genitourinary:  Negative for dysuria.  Musculoskeletal:  Negative for myalgias.  Skin:  Negative for itching and rash.  Neurological:  Negative for dizziness and headaches.  Endo/Heme/Allergies:        See allergy listing  Psychiatric/Behavioral:  Positive for depression and hallucinations. Negative for substance abuse and suicidal ideas. The  patient is nervous/anxious. The patient does not have insomnia.    Blood pressure 108/72, pulse 88, temperature 98.7 F (37.1 C), temperature source Oral, resp. rate 16, height 5' (1.524 m), weight 59.3 kg, last menstrual period 04/12/2011, SpO2 100%. Body mass index is 25.55 kg/m.  Treatment Plan Summary: Daily contact with patient to assess and evaluate symptoms and progress in treatment and Medication management  Physician Treatment Plan for Primary Diagnosis: Schizophrenia  Long Term Goal(s): Improvement in symptoms so as ready for discharge   Short Term Goals: Ability to identify changes in lifestyle to reduce recurrence of condition will improve, Ability to verbalize feelings will improve, Ability to disclose and discuss suicidal ideas, Ability to demonstrate self-control will improve, Ability to identify and develop effective coping behaviors will improve, Ability to maintain clinical measurements within normal limits will improve, Compliance with prescribed medications will improve, and Ability to identify triggers associated with substance abuse/mental health issues will improve   Physician Treatment Plan for Secondary Diagnosis:  Assessment: Valerie Lee is a 48 year old African-American female with prior psychiatric history significant for MDD recurrent severe without psychotic features, schizoaffective disorder mixed type, depression, and bipolar 1 disorder.  [facility] from [facility] health ED at [facility] for worsening depression resulting in suicidal ideation with overdose intent in the context of  uninformed break-up by the fianc.  The patient reports experiencing auditory and visual hallucinations since the age of 62.    The fact that she has no accompanying delusions, thought disorganization, describes full bodied, clear, colorful demons and devils, and describes hallucinations that are quite organized and coherent, and  reports vivid visual hallucinations without any accompanying perceptual disturbance, raises concerns for factitious disorder.  Principal  Problem: Schizophrenia, rule out factitious disorder   Plans: Medications: --Discontinue Zyprexa  15 mg nightly due to patient's concerns for lack of efficacy. -- Consolidate Haldol  from 5 mg every morning and 10 mg nightly to 15 mg po at bedtime only --Discontinue Trileptal  tablet 300 mg p.o. daily for mood stabilization due to no indication for use and 3A for induction we will lower the serum level of haloperidol .  --Continue gabapentin  capsule 200 mg p.o. twice daily --Continue trazodone  tablet 100 mg daily p.o. q. nightly as needed for insomnia --Continue hydroxyzine  25 mg p.o. 3 times daily as needed for anxiety.   Other PRN Medications  -Acetaminophen  650 mg every 6 as needed/mild pain  -Maalox 30 mL oral every 4 as needed/digestion  -Magnesium  hydroxide 30 mL daily as needed/mild constipation    --The risks/benefits/side-effects/alternatives to this medication were discussed in detail with the patient and time was given for questions. The patient consents to medication trial.   -- Metabolic profile and EKG monitoring obtained while on an atypical antipsychotic (BMI: Lipid Panel: HbgA1c: QTc:)   -- Encouraged patient to participate in unit milieu and in scheduled group therapies    Admission labs reviewed: CMP: Potassium level 3.3 replaced at the hospital, BMP ordered.  Otherwise normal.  CBC with differential: WBC 13.3 elevated, RBC 3.77 low, MCV 100.3 elevated, neutrophils 8.8 elevated, otherwise normal.  UDS positive for marijuana.   New labs ordered: BMP: Potassium 3.8 within normal limits, hemoglobin A1c, 4.8 within normal limits, vitamin D  25-hydroxy: 21.39 low, vitamin B12: 226 within normal limits, lipid panel: LDL 118 elevated, TSH: 0.679 within normal limits.   EKG reviewed: Sinus bradycardia, ventricular rate 55, QT/QTc 404/386.    Continue BH Agitation Protocol  --Haldol  5 mg, oral, 3 times daily as needed, mild agitation  --Benadryl  50 mg, oral, 3 times daily as needed, mild agitation                                      OR   --Haldol  injection 5 mg, IM, 3 times daily as needed, moderate agitation  --Benadryl  injection 50 mg, IM, 3 times daily as needed, moderate agitation  --Ativan  injection 2 mg, IM, 3 times daily as needed, moderate agitation                                     OR  --Haldol  injection 10 mg, IM, 3 times daily as needed, severe agitation  --Benadryl  injection 50 mg, IM, 3 times daily as needed, severe agitation  --Ativan  injection 2 mg, IM, 3 times daily as needed, severe agitation    Safety and Monitoring:  Voluntary admission to inpatient psychiatric unit for safety, stabilization and treatment  Daily contact with patient to assess and evaluate symptoms and progress in treatment  Patient's case to be discussed in multi-disciplinary team meeting  Observation Level : q15 minute checks  Vital signs: q12 hours  Precautions: suicide, but pt currently verbally contracts for safety on unit?    Discharge Planning:  Social work and case management to assist with discharge planning and identification of hospital follow-up needs prior to discharge  Estimated LOS: 5-7?days    Discharge Concerns: Need to establish a safety plan; Medication compliance and effectiveness  Discharge Goals: Return home with outpatient referrals for mental health follow-up including medication management/psychotherapy.  Valerie Pons, FNP 08/15/2023, 1:17 PM  Patient ID: Valerie Lee, female   DOB: 06-21-1975, 48 y.o.   MRN: [id number] Patient ID: Valerie Lee, female   DOB: 1975/04/18, 48 y.o.   MRN: [id number]

## 2023-08-15 NOTE — Group Note (Signed)
 Date:  08/15/2023 Time:  10:45 AM  Group Topic/Focus:  Goals Group:   The focus of this group is to help patients establish daily goals to achieve during treatment and discuss how the patient can incorporate goal setting into their daily lives to aide in recovery.    Participation Level:  Active  Participation Quality:  Attentive  Affect:  Appropriate  Cognitive:  Appropriate  Insight: Good  Engagement in Group:  Engaged  Modes of Intervention:  Education  Additional Comments:  She talked about her up-bringing and recognizes how it has affected her life now. She will use the coping the skills she has learned her to be productive in life.   Hughie Mae 08/15/2023, 10:45 AM

## 2023-08-15 NOTE — [facility] Group Notes (Signed)

## 2023-08-16 NOTE — Progress Notes (Signed)
 [facility] MD Progress Note  08/16/2023 3:18 PM DAMIYA SANDEFUR  MRN:  [id number]  Principal Problem: Paranoid schizophrenia (HCC) Diagnosis: Principal Problem:   Paranoid schizophrenia (HCC) Active Problems:   MDD (major depressive disorder), recurrent severe, without psychosis (HCC)   Major depressive disorder, single episode, severe with psychotic features (HCC)  Reason for admission: DAJHA URQUILLA is a 48 year old African-American female with prior psychiatric history significant for MDD recurrent severe without psychotic features, schizoaffective disorder mixed type, depression, and bipolar 1 disorder.  Patient presents voluntarily to [facility] from [facility] health ED at [facility] for worsening depression resulting in suicidal ideation with overdose intent, in the context of  uninformed break-up by the fianc.   24-hour chart reviewed: No acute events occurred overnight.  Patient was compliant with psychiatric medications.  She has been visible in the milieu and attending groups.  No detention protocol required.  As needed given include hydroxyzine  x 1 for anxiety, and trazodone  x 1 for sleep.   Yesterday the psychiatry team made the following recommendations: --Discontinue Zyprexa  15 mg nightly due to patient's concerns for lack of efficacy. --Consolidate Haldol  from 5 mg every morning and 10 mg nightly to 15 mg po at bedtime only --Discontinue Trileptal  tablet 300 mg p.o. daily for mood stabilization due to no indication for use and 3A for induction we will lower the serum level of haloperidol .  --Continue gabapentin  capsule 200 mg p.o. twice daily --Continue trazodone  tablet 100 mg daily p.o. q. nightly as needed for insomnia --Continue hydroxyzine  25 mg p.o. 3 times daily as needed for anxiety.   Today's assessment notes: On assessment today, the pt reports that her mood is less depressed and improved since admission.  She is alert, calm,  cooperative, and oriented to person, time, place, and situation.  Patient expresses appreciation for everything the staff has done for her improvement. Thought process is more organized and she is no longer hearing voices or seeing demons.  Denies SI, or HI.  Denies delusional thinking or paranoia.  No EPS/cogwheel rigidity observed during this assessment.  Reports that anxiety is at manageable level Sleep is improved Appetite is better Concentration is good Energy level is adequate Denies suicidal thoughts.  Further denies suicidal intent and plan.  Denies having any HI.  Denies having psychotic symptoms.   Denies having side effects to current psychiatric medications.   We discussed compliance to current medication regimen.  Total time spent with patient: 45 minutes  Past Psychiatric History: Previous Psych Diagnoses: Major depressive disorder without psychosis, schizoaffective disorder mixed type, suicidal ideation Prior inpatient treatment: Yes x 3, last admission in 2018 at [facility] Current/prior outpatient treatment: Report being at [facility] for therapy 1-1/2 years ago Prior rehab hx: Denies Psychotherapy hx: Denies History of suicide: X 1 attempted to run in front of oncoming [place] transportation bus in 2018 History of homicide or aggression: Denies Psychiatric medication history: Patient has been managed in the past with Abilify , gabapentin , Lamictal , Latuda , and trazodone  Psychiatric medication compliance history: Reports noncompliance Neuromodulation history: Denies Current Psychiatrist: Denies Current therapist: Denies  Past Medical History:  Past Medical History:  Diagnosis Date   Anemia    Anxiety    Asthma    rarely uses albuterol  rescue   Bipolar 1 disorder (HCC)    Depression    Paranoia (HCC)    Schizophrenia (HCC)    Schizophrenia (HCC)     Past Surgical History:  Procedure Laterality Date  ABDOMINAL HYSTERECTOMY  05/26/2011   Procedure:  HYSTERECTOMY ABDOMINAL;  Surgeon: Verlyn Goad, MD;  Location: [facility];  Service: Gynecology;  Laterality: N/A;   CHOLECYSTECTOMY  2011   SHOULDER ARTHROSCOPY Right 08/13/2020   Procedure: RIGHT SHOULDER ARTHROSCOPY WITH DEBRIDEMENT AND SUBACROMIAL DECOMPRESSION;  Surgeon: Arnie Lao, MD;  Location: [facility];  Service: Orthopedics;  Laterality: Right;   TUBAL LIGATION     TUBAL LIGATION     Family History:  Family History  Problem Relation Age of Onset   Hypertension Mother    Family Psychiatric  History: See H&P Social History:  Social History   Substance and Sexual Activity  Alcohol Use No     Social History   Substance and Sexual Activity  Drug Use Yes   Types: Marijuana    Social History   Socioeconomic History   Marital status: Divorced    Spouse name: Not on file   Number of children: Not on file   Years of education: Not on file   Highest education level: Not on file  Occupational History   Not on file  Tobacco Use   Smoking status: Every Day    Current packs/day: 0.25    Average packs/day: 0.3 packs/day for 24.0 years (6.0 ttl pk-yrs)    Types: Cigarettes   Smokeless tobacco: Never  Vaping Use   Vaping status: Never Used  Substance and Sexual Activity   Alcohol use: No   Drug use: Yes    Types: Marijuana   Sexual activity: Yes    Birth control/protection: Surgical  Other Topics Concern   Not on file  Social History Narrative   Not on file   Social Drivers of Health   Financial Resource Strain: Not on file  Food Insecurity: No Food Insecurity (08/07/2023)   Hunger Vital Sign    Worried About Running Out of Food in the Last Year: Never true    Ran Out of Food in the Last Year: Never true  Transportation Needs: No Transportation Needs (08/07/2023)   PRAPARE - Administrator, Civil Service (Medical): No    Lack of Transportation (Non-Medical): No  Physical Activity: Not on file  Stress: Not on file  Social  Connections: Not on file   Additional Social History:    Sleep: Fair  Appetite:  Good  Current Medications: Current Facility-Administered Medications  Medication Dose Route Frequency Provider Last Rate Last Admin   acetaminophen  (TYLENOL ) tablet 650 mg  650 mg Oral Q6H PRN Bobbitt, Shalon E, NP   650 mg at 08/14/23 2142   alum & mag hydroxide-simeth (MAALOX/MYLANTA) 200-200-20 MG/5ML suspension 30 mL  30 mL Oral Q4H PRN Bobbitt, Shalon E, NP       haloperidol  (HALDOL ) tablet 5 mg  5 mg Oral TID PRN Bobbitt, Shalon E, NP   5 mg at 08/09/23 1610   And   diphenhydrAMINE  (BENADRYL ) capsule 50 mg  50 mg Oral TID PRN Bobbitt, Shalon E, NP   50 mg at 08/09/23 9604   haloperidol  lactate (HALDOL ) injection 5 mg  5 mg Intramuscular TID PRN Bobbitt, Shalon E, NP       And   diphenhydrAMINE  (BENADRYL ) injection 50 mg  50 mg Intramuscular TID PRN Bobbitt, Shalon E, NP       And   LORazepam  (ATIVAN ) injection 2 mg  2 mg Intramuscular TID PRN Bobbitt, Shalon E, NP       haloperidol  lactate (HALDOL ) injection 10 mg  10 mg  Intramuscular TID PRN Bobbitt, Shalon E, NP       And   diphenhydrAMINE  (BENADRYL ) injection 50 mg  50 mg Intramuscular TID PRN Bobbitt, Shalon E, NP       And   LORazepam  (ATIVAN ) injection 2 mg  2 mg Intramuscular TID PRN Bobbitt, Shalon E, NP       docusate sodium  (COLACE) capsule 100 mg  100 mg Oral BID Jader Desai C, FNP   100 mg at 08/16/23 0732   gabapentin  (NEURONTIN ) capsule 200 mg  200 mg Oral BID Onuoha, Josephine C, NP   200 mg at 08/16/23 0732   haloperidol  (HALDOL ) tablet 20 mg  20 mg Oral QHS Izediuno, Iline Mallory, MD       hydrOXYzine  (ATARAX ) tablet 25 mg  25 mg Oral TID PRN Bobbitt, Shalon E, NP   25 mg at 08/15/23 2120   magnesium  hydroxide (MILK OF MAGNESIA) suspension 30 mL  30 mL Oral Daily PRN Bobbitt, Shalon E, NP       nicotine  (NICODERM CQ  - dosed in mg/24 hours) patch 14 mg  14 mg Transdermal Daily Attiah, Nadir, MD   14 mg at 08/16/23 0732   traZODone   (DESYREL ) tablet 100 mg  100 mg Oral QHS PRN Bruk Tumolo C, FNP   100 mg at 08/15/23 2120   Vitamin D  (Ergocalciferol ) (DRISDOL ) 1.25 MG (50000 UNIT) capsule 50,000 Units  50,000 Units Oral Daily Parker, Alvin S, MD   50,000 Units at 08/16/23 0732    Lab Results:  No results found for this or any previous visit (from the past 48 hours).  Blood Alcohol level:  Lab Results  Component Value Date   [facility] <15 08/05/2023   ETH <5 10/04/2016   Metabolic Disorder Labs: Lab Results  Component Value Date   HGBA1C 4.8 08/08/2023   MPG 91 08/08/2023   MPG 88 10/06/2016   Lab Results  Component Value Date   PROLACTIN 68.7 (H) 11/26/2014   Lab Results  Component Value Date   CHOL 188 08/08/2023   TRIG 127 08/08/2023   HDL 45 08/08/2023   CHOLHDL 4.2 08/08/2023   VLDL 25 08/08/2023   LDLCALC 118 (H) 08/08/2023   LDLCALC 126 (H) 10/06/2016   Physical Findings: AIMS:  , ,  ,  ,    CIWA:    COWS:     Musculoskeletal: Strength & Muscle Tone: within normal limits Gait & Station: normal Patient leans: N/A  Psychiatric Specialty Exam:  Presentation  General Appearance:  Appropriate for Environment; Casual; Fairly Groomed  Eye Contact: Good  Speech: Clear and Coherent  Speech Volume: Normal  Handedness: Right  Mood and Affect  Mood: Euthymic  Affect: Congruent  Thought Process  Thought Processes: Coherent; Goal Directed  Descriptions of Associations:Intact  Orientation:Full (Time, Place and Person)  Thought Content:Logical  History of Schizophrenia/Schizoaffective disorder:No data recorded Duration of Psychotic Symptoms:No data recorded Hallucinations:Hallucinations: None Description of Command Hallucinations: None Description of Auditory Hallucinations: None Description of Visual Hallucinations: None  Ideas of Reference:None  Suicidal Thoughts:Suicidal Thoughts: No SI Passive Intent and/or Plan: -- (Denies)  Homicidal Thoughts:Homicidal Thoughts:  No  Sensorium  Memory: Immediate Good; Recent Good  Judgment: Good  Insight: Good  Executive Functions  Concentration: Good  Attention Span: Good  Recall: Good  Fund of Knowledge: Fair  Language: Good  Psychomotor Activity  Psychomotor Activity: Psychomotor Activity: Normal  Assets  Assets: Communication Skills; Desire for Improvement; Physical Health; Resilience  Sleep  Sleep: Sleep: Good Number  of Hours of Sleep: 6.75  Physical Exam: Physical Exam Vitals and nursing note reviewed.  Constitutional:      General: She is not in acute distress.    Appearance: She is not toxic-appearing.  HENT:     Head: Normocephalic.     Right Ear: External ear normal.     Left Ear: External ear normal.     Nose: Nose normal.     Mouth/Throat:     Mouth: Mucous membranes are moist.  Eyes:     Extraocular Movements: Extraocular movements intact.  Cardiovascular:     Rate and Rhythm: Normal rate.     Pulses: Normal pulses.  Pulmonary:     Effort: Pulmonary effort is normal.  Abdominal:     Comments: Deferred  Genitourinary:    Comments: Deferred  Musculoskeletal:        General: Normal range of motion.     Cervical back: Normal range of motion.  Skin:    General: Skin is warm.  Neurological:     General: No focal deficit present.     Mental Status: She is alert and oriented to person, place, and time.  Psychiatric:        Mood and Affect: Mood normal.        Behavior: Behavior normal.     Comments: Mood and behavior improving    Review of Systems  Constitutional:  Negative for chills and fever.  HENT:  Negative for sore throat.   Eyes:  Negative for blurred vision.  Respiratory:  Negative for cough, sputum production, shortness of breath and wheezing.   Cardiovascular:  Negative for chest pain and palpitations.  Gastrointestinal:  Negative for heartburn, nausea and vomiting.  Genitourinary:  Negative for dysuria.  Musculoskeletal:  Negative for  myalgias.  Skin:  Negative for itching and rash.  Neurological:  Negative for dizziness and headaches.  Endo/Heme/Allergies:        See allergy listing  Psychiatric/Behavioral:  Positive for depression and hallucinations. Negative for substance abuse and suicidal ideas. The patient is nervous/anxious. The patient does not have insomnia.    Blood pressure 114/79, pulse 95, temperature 98.7 F (37.1 C), temperature source Oral, resp. rate 20, height 5' (1.524 m), weight 59.3 kg, last menstrual period 04/12/2011, SpO2 100%. Body mass index is 25.55 kg/m.  Treatment Plan Summary: Daily contact with patient to assess and evaluate symptoms and progress in treatment and Medication management  Physician Treatment Plan for Primary Diagnosis: Schizophrenia  Long Term Goal(s): Improvement in symptoms so as ready for discharge   Short Term Goals: Ability to identify changes in lifestyle to reduce recurrence of condition will improve, Ability to verbalize feelings will improve, Ability to disclose and discuss suicidal ideas, Ability to demonstrate self-control will improve, Ability to identify and develop effective coping behaviors will improve, Ability to maintain clinical measurements within normal limits will improve, Compliance with prescribed medications will improve, and Ability to identify triggers associated with substance abuse/mental health issues will improve   Physician Treatment Plan for Secondary Diagnosis:  Assessment: ADALEY KIENE is a 48 year old African-American female with prior psychiatric history significant for MDD recurrent severe without psychotic features, schizoaffective disorder mixed type, depression, and bipolar 1 disorder.  [facility] to [facility] from [facility] health ED at [facility] for worsening depression resulting in suicidal ideation with overdose intent in the context of  uninformed break-up by the fianc.  The patient  reports experiencing auditory and visual hallucinations since  the age of 46.    The fact that she has no accompanying delusions, thought disorganization, describes full bodied, clear, colorful demons and devils, and describes hallucinations that are quite organized and coherent, and reports vivid visual hallucinations without any accompanying perceptual disturbance, raises concerns for factitious disorder.  Principal Problem: Schizophrenia, rule out factitious disorder   Plans: Medications: --Discontinue Zyprexa  15 mg nightly due to patient's concerns for lack of efficacy. --Continue Haldol  15 mg po at bedtime only --Discontinue Trileptal  tablet 300 mg p.o. daily for mood stabilization due to no indication for use and 3A for induction we will lower the serum level of haloperidol .  --Continue gabapentin  capsule 200 mg p.o. twice daily --Continue trazodone  tablet 100 mg daily p.o. q. nightly as needed for insomnia --Continue hydroxyzine  25 mg p.o. 3 times daily as needed for anxiety.   Other PRN Medications  -Acetaminophen  650 mg every 6 as needed/mild pain  -Maalox 30 mL oral every 4 as needed/digestion  -Magnesium  hydroxide 30 mL daily as needed/mild constipation    --The risks/benefits/side-effects/alternatives to this medication were discussed in detail with the patient and time was given for questions. The patient consents to medication trial.   -- Metabolic profile and EKG monitoring obtained while on an atypical antipsychotic (BMI: Lipid Panel: HbgA1c: QTc:)   -- Encouraged patient to participate in unit milieu and in scheduled group therapies    Admission labs reviewed: CMP: Potassium level 3.3 replaced at the hospital, BMP ordered.  Otherwise normal.  CBC with differential: WBC 13.3 elevated, RBC 3.77 low, MCV 100.3 elevated, neutrophils 8.8 elevated, otherwise normal.  UDS positive for marijuana.   New labs ordered: BMP: Potassium 3.8 within normal limits, hemoglobin A1c, 4.8 within  normal limits, vitamin D  25-hydroxy: 21.39 low, vitamin B12: 226 within normal limits, lipid panel: LDL 118 elevated, TSH: 0.679 within normal limits.   EKG reviewed: Sinus bradycardia, ventricular rate 55, QT/QTc 404/386.   Continue BH Agitation Protocol  --Haldol  5 mg, oral, 3 times daily as needed, mild agitation  --Benadryl  50 mg, oral, 3 times daily as needed, mild agitation                                      OR   --Haldol  injection 5 mg, IM, 3 times daily as needed, moderate agitation  --Benadryl  injection 50 mg, IM, 3 times daily as needed, moderate agitation  --Ativan  injection 2 mg, IM, 3 times daily as needed, moderate agitation                                     OR  --Haldol  injection 10 mg, IM, 3 times daily as needed, severe agitation  --Benadryl  injection 50 mg, IM, 3 times daily as needed, severe agitation  --Ativan  injection 2 mg, IM, 3 times daily as needed, severe agitation    Safety and Monitoring:  Voluntary admission to inpatient psychiatric unit for safety, stabilization and treatment  Daily contact with patient to assess and evaluate symptoms and progress in treatment  Patient's case to be discussed in multi-disciplinary team meeting  Observation Level : q15 minute checks  Vital signs: q12 hours  Precautions: suicide, but pt currently verbally contracts for safety on unit?    Discharge Planning:  Social work and case management to assist with discharge planning and identification of hospital follow-up  needs prior to discharge  Estimated LOS: 5-7?days    Discharge Concerns: Need to establish a safety plan; Medication compliance and effectiveness  Discharge Goals: Return home with outpatient referrals for mental health follow-up including medication management/psychotherapy.    Laurence Pons, FNP 08/16/2023, 3:18 PM  Patient ID: Cyrena Drilling, female   DOB: Sep 06, 1975, 48 y.o.   MRN: [id number] Patient ID: SHERIAN VALENZA, female   DOB: Sep 04, 1975, 48  y.o.   MRN: [id number] Patient ID: STARKISHA TULLIS, female   DOB: 08/21/1975, 48 y.o.   MRN: [id number]

## 2023-08-16 NOTE — Progress Notes (Signed)
   08/16/23 2000  Psych Admission Type (Psych Patients Only)  Admission Status Voluntary  Psychosocial Assessment  Patient Complaints None  Eye Contact Fair  Facial Expression Animated  Affect Appropriate to circumstance  Speech Logical/coherent  Interaction Assertive  Motor Activity Other (Comment) (appropriate)  Appearance/Hygiene Unremarkable  Behavior Characteristics Cooperative  Mood Pleasant  Thought Process  Coherency WDL  Content WDL  Delusions None reported or observed  Perception WDL  Hallucination None reported or observed  Judgment Poor  Confusion None  Danger to Self  Current suicidal ideation? Denies  Self-Injurious Behavior No self-injurious ideation or behavior indicators observed or expressed   Agreement Not to Harm Self Yes  Description of Agreement verbal  Danger to Others  Danger to Others None reported or observed

## 2023-08-16 NOTE — Plan of Care (Signed)
  Problem: Education: Goal: Emotional status will improve Outcome: Progressing Goal: Mental status will improve Outcome: Progressing Goal: Verbalization of understanding the information provided will improve Outcome: Progressing   Problem: Activity: Goal: Interest or engagement in activities will improve Outcome: Progressing Goal: Sleeping patterns will improve Outcome: Progressing   Problem: Coping: Goal: Ability to verbalize frustrations and anger appropriately will improve Outcome: Progressing Goal: Ability to demonstrate self-control will improve Outcome: Progressing   Problem: Health Behavior/Discharge Planning: Goal: Identification of resources available to assist in meeting health care needs will improve Outcome: Progressing Goal: Compliance with treatment plan for underlying cause of condition will improve Outcome: Progressing   Problem: Physical Regulation: Goal: Ability to maintain clinical measurements within normal limits will improve Outcome: Progressing   Problem: Safety: Goal: Periods of time without injury will increase Outcome: Progressing

## 2023-08-16 NOTE — Progress Notes (Signed)
 Kierra did not attend NA wrap up group

## 2023-08-16 NOTE — Progress Notes (Signed)
   08/16/23 0551  15 Minute Checks  Location Bedroom  Visual Appearance Calm  Behavior Composed  Sleep (Behavioral Health Patients Only)  Calculate sleep? (Click Yes once per 24 hr at 0600 safety check) Yes  Documented sleep last 24 hours 6.5

## 2023-08-16 NOTE — Plan of Care (Signed)
  Problem: Education: Goal: Emotional status will improve Outcome: Progressing Goal: Verbalization of understanding the information provided will improve Outcome: Progressing   Problem: Activity: Goal: Sleeping patterns will improve Outcome: Progressing   Problem: Coping: Goal: Ability to verbalize frustrations and anger appropriately will improve Outcome: Progressing

## 2023-08-16 NOTE — Progress Notes (Signed)
   08/16/23 0730  Psych Admission Type (Psych Patients Only)  Admission Status Voluntary  Psychosocial Assessment  Patient Complaints None  Eye Contact Fair  Facial Expression Anxious  Affect Appropriate to circumstance  Speech Logical/coherent  Interaction Assertive  Motor Activity Other (Comment) (WNL)  Appearance/Hygiene Unremarkable  Behavior Characteristics Cooperative  Mood Pleasant  Thought Process  Coherency WDL  Content WDL  Delusions None reported or observed  Perception WDL  Hallucination None reported or observed  Judgment Poor  Confusion None  Danger to Self  Current suicidal ideation? Denies  Self-Injurious Behavior No self-injurious ideation or behavior indicators observed or expressed   Agreement Not to Harm Self Yes  Description of Agreement verbal  Danger to Others  Danger to Others None reported or observed

## 2023-08-16 NOTE — Group Note (Signed)
 Recreation Therapy Group Note   Group Topic:Communication  Group Date: 08/16/2023 Start Time: 8657 End Time: 1000 Facilitators: Vesna Kable-McCall, LRT,CTRS Location: [address] Dayroom   Group Topic: Communication, Team Building, Problem Solving  Goal Area(s) Addresses:  Patient will effectively work with peer towards shared goal.  Patient will identify skills used to make activity successful.  Patient will identify how skills used during activity can be applied to reach post d/c goals.   Behavioral Response: N/A  Intervention: STEM Activity- Glass blower/designer  Activity: Tallest [employer]. In teams of 5-6, patients were given 11 craft pipe cleaners. Using the materials provided, patients were instructed to compete again the opposing team(s) to build the tallest free-standing structure from floor level. The activity was timed; difficulty increased by Clinical research associate as Production designer, theatre/television/film continued.  Systematically resources were removed with additional directions for example, placing one arm behind their back, working in silence, and shape stipulations. LRT facilitated post-activity discussion reviewing team processes and necessary communication skills involved in completion. Patients were encouraged to reflect how the skills utilized, or not utilized, in this activity can be incorporated to positively impact support systems post discharge.  Education: Pharmacist, community, Scientist, physiological, Discharge Planning   Education Outcome: Acknowledges education/In group clarification offered/Needs additional education.    Affect/Mood: N/A   Participation Level: Did not attend    Clinical Observations/Individualized Feedback:     Plan: Continue to engage patient in RT group sessions 2-3x/week.   Chidubem Chaires-McCall, LRT,CTRS  08/16/2023 1:03 PM

## 2023-08-17 MED ORDER — HALOPERIDOL 20 MG PO TABS
20.0000 mg | ORAL_TABLET | Freq: Every day | ORAL | 0 refills | Status: AC
Start: 2023-08-17 — End: ?

## 2023-08-17 MED ORDER — NICOTINE 14 MG/24HR TD PT24
14.0000 mg | MEDICATED_PATCH | Freq: Every day | TRANSDERMAL | 0 refills | Status: AC
Start: 2023-08-18 — End: ?

## 2023-08-17 MED ORDER — VITAMIN D (ERGOCALCIFEROL) 1.25 MG (50000 UNIT) PO CAPS: 50000.0000 [IU] | ORAL_CAPSULE | Freq: Every day | ORAL | 0 refills | Status: AC

## 2023-08-17 MED ORDER — TRAZODONE HCL 100 MG PO TABS: 100.0000 mg | ORAL_TABLET | Freq: Every evening | ORAL | 0 refills | Status: AC | PRN

## 2023-08-17 MED ORDER — HYDROXYZINE HCL 25 MG PO TABS
25.0000 mg | ORAL_TABLET | Freq: Three times a day (TID) | ORAL | 0 refills | Status: AC | PRN
Start: 2023-08-17 — End: ?

## 2023-08-17 MED ORDER — GABAPENTIN 100 MG PO CAPS: 200.0000 mg | ORAL_CAPSULE | Freq: Two times a day (BID) | ORAL | 0 refills | Status: AC

## 2023-08-17 MED ORDER — DOCUSATE SODIUM 100 MG PO CAPS: 100.0000 mg | ORAL_CAPSULE | Freq: Two times a day (BID) | ORAL | 0 refills | Status: AC

## 2023-08-17 NOTE — Progress Notes (Signed)
 Pt awake and alert.  Bright affect and congruent mood. Denies SI, HI or AVH.  Pt displaying no s/s of withdrawal.  Given discharge paperwork including MAR and follow up instructions.  Pt verbalized understanding of where to pick up medications.  Pt was given back all belongings from locker 9 and escorted to lobby.  No distress noted.

## 2023-08-17 NOTE — Plan of Care (Signed)
  Problem: Education: Goal: Emotional status will improve Outcome: Adequate for Discharge Goal: Mental status will improve Outcome: Adequate for Discharge Goal: Verbalization of understanding the information provided will improve Outcome: Adequate for Discharge   Problem: Activity: Goal: Interest or engagement in activities will improve Outcome: Adequate for Discharge Goal: Sleeping patterns will improve Outcome: Adequate for Discharge   Problem: Coping: Goal: Ability to verbalize frustrations and anger appropriately will improve Outcome: Adequate for Discharge Goal: Ability to demonstrate self-control will improve Outcome: Adequate for Discharge   Problem: Health Behavior/Discharge Planning: Goal: Identification of resources available to assist in meeting health care needs will improve Outcome: Adequate for Discharge Goal: Compliance with treatment plan for underlying cause of condition will improve Outcome: Adequate for Discharge   Problem: Physical Regulation: Goal: Ability to maintain clinical measurements within normal limits will improve Outcome: Adequate for Discharge   Problem: Safety: Goal: Periods of time without injury will increase Outcome: Adequate for Discharge

## 2023-08-17 NOTE — Transportation (Signed)
 08/17/2023  Cyrena Drilling DOB: [birth year] MRN: [id number]   RIDER WAIVER AND RELEASE OF LIABILITY  For the purposes of helping with transportation needs, [facility] partners with outside transportation providers (taxi companies, [place], Catering manager.) to give [facility] patients or other approved people the choice of on-demand rides [employer]") to our buildings for non-emergency visits.  By using [employer], I, the person signing this document, on behalf of myself and/or any legal minors (in my care using the [employer]), agree:  Science writer given to me are supplied by independent, outside transportation providers who do not work for, or have any affiliation with, [employer]. [facility] is not a transportation company. [facility] has no control over the quality or safety of the rides I get using [employer]. [facility] has no control over whether any outside ride will happen on time or not. [facility] gives no guarantee on the reliability, quality, safety, or availability on any rides, or that no mistakes will happen. I know and accept that traveling by vehicle (car, truck, SVU, [employer] [place], bus, taxi, etc.) has risks of serious injuries such as disability, being paralyzed, and death. I know and agree the risk of using [employer] is mine alone, and not [employer]. Transport Services are provided "as is" and as are available. The transportation providers are in charge for all inspections and care of the vehicles used to provide these rides. I agree not to take legal action against [facility], its agents, employees, officers, directors, representatives, insurers, attorneys, assigns, successors, subsidiaries, and affiliates at any time for any reasons related directly or indirectly to using [employer]. I also agree not to take legal action against  or its affiliates for any injury, death, or damage to property caused by or related to  using [employer]. I have read this Waiver and Release of Liability, and I understand the terms used in it and their legal meaning. This Waiver is freely and voluntarily given with the understanding that my right (or any legal minors) to legal action against  relating to [employer] is knowingly given up to use these services.   I attest that I read the Ride Waiver and Release of Liability to Cyrena Drilling, gave [name]-Davis the opportunity to ask questions and answered the questions asked (if any). I affirm that KINLEY DOZIER then provided consent for assistance with transportation.     Winnona Wargo, LCSWA 08/17/2023

## 2023-08-17 NOTE — Discharge Summary (Addendum)
 Physician Discharge Summary Note  Patient:  Valerie Lee is an 48 y.o., female MRN:  [id number] DOB:  03/04/1976 Patient phone:  [phone] (home)  Patient address:   [address] [place] [address],  Total Time spent with patient: 45 minutes  Date of Admission:  08/07/2023 Date of Discharge:   08/17/2023  Reason for Admission:  Valerie Lee is a 48 year old African-American female with prior psychiatric history significant for MDD recurrent severe without psychotic features, schizoaffective disorder mixed type, depression, and bipolar 1 disorder.  Patient presents [facility] to [facility] from [facility] health ED at [facility] for worsening depression resulting in suicidal ideation with overdose intent, in the context of  uninformed break-up by the fianc.   Principal Problem: Paranoid schizophrenia (HCC) Discharge Diagnoses: Principal Problem:   Paranoid schizophrenia (HCC) Active Problems:   MDD (major depressive disorder), recurrent severe, without psychosis (HCC)   Major depressive disorder, single episode, severe with psychotic features (HCC)  Past Psychiatric History: Previous Psych Diagnoses: Major depressive disorder without psychosis, schizoaffective disorder mixed type, suicidal ideation Prior inpatient treatment: Yes x 3, last admission in 2018 at [facility] Current/prior outpatient treatment: Report being at [facility] for therapy 1-1/2 years ago Prior rehab hx: Denies Psychotherapy hx: Denies History of suicide: X 1 attempted to run in front of oncoming [employer] transportation bus in 2018 History of homicide or aggression: Denies Psychiatric medication history: Patient has been managed in the past with Abilify , gabapentin , Lamictal , Latuda , and trazodone  Psychiatric medication compliance history: Reports noncompliance Neuromodulation history: Denies Current Psychiatrist: Denies Current therapist: Denies     Past Medical History:  Past Medical History:  Diagnosis Date   Anemia    Anxiety    Asthma    rarely uses albuterol  rescue   Bipolar 1 disorder (HCC)    Depression    Paranoia (HCC)    Schizophrenia (HCC)    Schizophrenia (HCC)     Past Surgical History:  Procedure Laterality Date   ABDOMINAL HYSTERECTOMY  05/26/2011   Procedure: HYSTERECTOMY ABDOMINAL;  Surgeon: Verlyn Goad, MD;  Location: [facility];  Service: Gynecology;  Laterality: N/A;   CHOLECYSTECTOMY  2011   SHOULDER ARTHROSCOPY Right 08/13/2020   Procedure: RIGHT SHOULDER ARTHROSCOPY WITH DEBRIDEMENT AND SUBACROMIAL DECOMPRESSION;  Surgeon: Arnie Lao, MD;  Location: [facility];  Service: Orthopedics;  Laterality: Right;   TUBAL LIGATION     TUBAL LIGATION     Family History:  Family History  Problem Relation Age of Onset   Hypertension Mother    Family Psychiatric  History: See H&P Social History:  Social History   Substance and Sexual Activity  Alcohol Use No     Social History   Substance and Sexual Activity  Drug Use Yes   Types: Marijuana    Social History   Socioeconomic History   Marital status: Divorced    Spouse name: Not on file   Number of children: Not on file   Years of education: Not on file   Highest education level: Not on file  Occupational History   Not on file  Tobacco Use   Smoking status: Every Day    Current packs/day: 0.25    Average packs/day: 0.3 packs/day for 24.0 years (6.0 ttl pk-yrs)    Types: Cigarettes   Smokeless tobacco: Never  Vaping Use   Vaping status: Never Used  Substance and Sexual Activity   Alcohol use: No   Drug use:  Yes    Types: Marijuana   Sexual activity: Yes    Birth control/protection: Surgical  Other Topics Concern   Not on file  Social History Narrative   Not on file   Social Drivers of Health   Financial Resource Strain: Not on file  Food Insecurity: No Food Insecurity (08/07/2023)   Hunger Vital Sign     Worried About Running Out of Food in the Last Year: Never true    Ran Out of Food in the Last Year: Never true  Transportation Needs: No Transportation Needs (08/07/2023)   PRAPARE - Administrator, Civil Service (Medical): No    Lack of Transportation (Non-Medical): No  Physical Activity: Not on file  Stress: Not on file  Social Connections: Not on file    Hospital Course:  During the patient's hospitalization, patient had extensive initial psychiatric evaluation, and follow-up psychiatric evaluations every day.  Psychiatric diagnoses provided upon initial assessment:  Major depressive disorder severe with psychotic features MDD (major depressive disorder), recurrent severe, without psychosis (HCC) Suicidal ideation  Patient's psychiatric medications were adjusted on admission:  --Continue Zyprexa  disintegrating tab 5 mg p.o. at bedtime for psychosis --Continue Trileptal  tablet 150 mg p.o. daily for mood stabilization --Continue gabapentin  capsule 200 mg p.o. twice daily -- Continue trazodone  50 mg p.o. q. nightly as needed for insomnia -- Continue hydroxyzine  25 mg p.o. 3 times daily as needed for anxiety.  During the hospitalization, other adjustments were made to the patient's psychiatric medication regimen:  -- Zyprexa  disintegrated tablet 5 mg p.o. at bedtime for psychosis was discontinued due to noneffectiveness --Trileptal  tablet 150 mg p.o. daily for mood stabilization was discontinued --Haldol  20 mg p.o. daily at bedtime for psychosis was initiated Patient's care was discussed during the interdisciplinary team meeting every day during the hospitalization.  The patient denies having side effects to prescribed psychiatric medication.  Gradually, patient started adjusting to milieu. The patient was evaluated each day by a clinical provider to ascertain response to treatment. Improvement was noted by the patient's report of decreasing symptoms, improved sleep and  appetite, affect, medication tolerance, behavior, and participation in unit programming.  Patient was asked each day to complete a self inventory noting mood, mental status, pain, new symptoms, anxiety and concerns.    Symptoms were reported as significantly decreased or resolved completely by discharge.   On day of discharge, the patient reports that their mood is stable. The patient denied having suicidal thoughts for more than 48 hours prior to discharge.  Patient denies having homicidal thoughts.  Patient denies having auditory hallucinations.  Patient denies any visual hallucinations or other symptoms of psychosis. The patient was motivated to continue taking medication with a goal of continued improvement in mental health.   The patient reports their target psychiatric symptoms of psychosis responded well to the psychiatric medications, and the patient reports overall benefit other psychiatric hospitalization. Supportive psychotherapy was provided to the patient. The patient also participated in regular group therapy while hospitalized. Coping skills, problem solving as well as relaxation therapies were also part of the unit programming.  Labs were reviewed with the patient, and abnormal results were discussed with the patient.  The patient is able to verbalize their individual safety plan to this provider.  # It is recommended to the patient to continue psychiatric medications as prescribed, after discharge from the hospital.    # It is recommended to the patient to follow up with your outpatient psychiatric provider and PCP.  #  It was discussed with the patient, the impact of alcohol, drugs, tobacco have been there overall psychiatric and medical wellbeing, and total abstinence from substance use was recommended the patient.ed.  # Prescriptions provided or sent directly to preferred pharmacy at discharge. Patient agreeable to plan. Given opportunity to ask questions. Appears to feel  comfortable with discharge.    # In the event of worsening symptoms, the patient is instructed to call the crisis hotline, 911 and or go to the nearest ED for appropriate evaluation and treatment of symptoms. To follow-up with primary care provider for other medical issues, concerns and or health care needs  # Patient was discharged to home with a plan to follow up as noted below.   Addendum: The patient tolerating her psychotropic medications well without any side effects.  No EPS or cogwheel rigidity.  Discussed the importance of compliance with psychotropic medication to prevent  relapse.  Patient's auditory/visual hallucination has completely resolved.  No bizarre or disorganized behavior upon discharge.  She is discharged to home in stable condition with adequate follow-up resources.  Physical Findings: AIMS:  , ,  ,  ,    CIWA:    COWS:     Musculoskeletal: Strength & Muscle Tone: within normal limits Gait & Station: normal Patient leans: N/A  Psychiatric Specialty Exam:  Presentation  General Appearance:  Appropriate for Environment; Casual; Fairly Groomed  Eye Contact: Good  Speech: Clear and Coherent  Speech Volume: Normal  Handedness: Right  Mood and Affect  Mood: Euthymic  Affect: Congruent  Thought Process  Thought Processes: Coherent; Goal Directed  Descriptions of Associations:Intact  Orientation:Full (Time, Place and Person)  Thought Content:Logical  History of Schizophrenia/Schizoaffective disorder:No data recorded Duration of Psychotic Symptoms:No data recorded Hallucinations:Hallucinations: None Description of Command Hallucinations: None Description of Auditory Hallucinations: None Description of Visual Hallucinations: None  Ideas of Reference:None  Suicidal Thoughts:Suicidal Thoughts: No SI Passive Intent and/or Plan: -- (Denies)  Homicidal Thoughts:Homicidal Thoughts: No  Sensorium  Memory: Immediate Good; Recent  Good  Judgment: Good  Insight: Good  Executive Functions  Concentration: Good  Attention Span: Good  Recall: Good  Fund of Knowledge: Fair  Language: Good  Psychomotor Activity  Psychomotor Activity: Psychomotor Activity: Normal  Assets  Assets: Communication Skills; Desire for Improvement; Physical Health; Resilience  Sleep  Sleep: Sleep: Good Number of Hours of Sleep: 6.75  Physical Exam: Physical Exam Vitals and nursing note reviewed.  Constitutional:      General: She is not in acute distress.    Appearance: She is not toxic-appearing.  HENT:     Head: Normocephalic.     Right Ear: External ear normal.     Left Ear: External ear normal.     Nose: Nose normal.     Mouth/Throat:     Mouth: Mucous membranes are moist.     Pharynx: Oropharynx is clear.  Eyes:     Extraocular Movements: Extraocular movements intact.  Cardiovascular:     Rate and Rhythm: Normal rate.     Pulses: Normal pulses.  Pulmonary:     Effort: Pulmonary effort is normal.  Abdominal:     Comments: Deferred  Genitourinary:    Comments: Deferred Musculoskeletal:        General: Normal range of motion.     Cervical back: Normal range of motion.  Skin:    General: Skin is warm.  Neurological:     General: No focal deficit present.     Mental Status: She is  alert and oriented to person, place, and time.  Psychiatric:        Mood and Affect: Mood normal.        Behavior: Behavior normal.        Thought Content: Thought content normal.    Review of Systems  Constitutional:  Negative for chills and fever.  HENT:  Negative for sore throat.   Eyes:  Negative for blurred vision.  Respiratory:  Negative for cough, sputum production, shortness of breath and wheezing.   Cardiovascular:  Negative for chest pain and palpitations.  Gastrointestinal:  Negative for heartburn, nausea and vomiting.  Genitourinary:  Negative for dysuria and urgency.  Musculoskeletal:  Negative for  falls.  Skin:  Negative for itching and rash.  Endo/Heme/Allergies:        See allergy listing  Psychiatric/Behavioral:  Negative for depression, hallucinations, memory loss, substance abuse and suicidal ideas. The patient is nervous/anxious (Improved with medication). The patient does not have insomnia.    Blood pressure 108/65, pulse 93, temperature 98.8 F (37.1 C), temperature source Oral, resp. rate 16, height 5' (1.524 m), weight 59.3 kg, last menstrual period 04/12/2011, SpO2 99%. Body mass index is 25.55 kg/m.  Social History   Tobacco Use  Smoking Status Every Day   Current packs/day: 0.25   Average packs/day: 0.3 packs/day for 24.0 years (6.0 ttl pk-yrs)   Types: Cigarettes  Smokeless Tobacco Never   Tobacco Cessation:  A prescription for an FDA-approved tobacco cessation medication provided at discharge  Blood Alcohol level:  Lab Results  Component Value Date   [facility] <15 08/05/2023   ETH <5 10/04/2016   Metabolic Disorder Labs:  Lab Results  Component Value Date   HGBA1C 4.8 08/08/2023   MPG 91 08/08/2023   MPG 88 10/06/2016   Lab Results  Component Value Date   PROLACTIN 68.7 (H) 11/26/2014   Lab Results  Component Value Date   CHOL 188 08/08/2023   TRIG 127 08/08/2023   HDL 45 08/08/2023   CHOLHDL 4.2 08/08/2023   VLDL 25 08/08/2023   LDLCALC 118 (H) 08/08/2023   LDLCALC 126 (H) 10/06/2016    See Psychiatric Specialty Exam and Suicide Risk Assessment completed by Attending Physician prior to discharge.  Discharge destination:  Home  Is patient on multiple antipsychotic therapies at discharge:  No   Has Patient had three or more failed trials of antipsychotic monotherapy by history:  No  Recommended Plan for Multiple Antipsychotic Therapies: NA  Discharge Instructions     Increase activity slowly   Complete by: As directed       Allergies as of 08/17/2023       Reactions   Bactrim Anaphylaxis, Itching, Swelling   Blue Dyes (parenteral)  Anaphylaxis, Shortness Of Breath   Codeine Itching, Swelling   Hallucinations Pt can take vicodin with no reaction   Penicillins Anaphylaxis, Itching, Swelling   Has patient had a PCN reaction causing immediate rash, facial/tongue/throat swelling, SOB or lightheadedness with hypotension: Yes Has patient had a PCN reaction causing severe rash involving mucus membranes or skin necrosis: Yes Has patient had a PCN reaction that required hospitalization: Yes Has patient had a PCN reaction occurring within the last 10 years: No If all of the above answers are "NO", then may proceed with Cephalosporin use.   Aspirin Swelling   Ibuprofen Other (See Comments)   Lowers hemoglobin - pt has G6PD deficiency        Medication List     STOP taking  these medications    ARIPiprazole  10 MG tablet Commonly known as: ABILIFY    benzonatate  100 MG capsule Commonly known as: TESSALON    ferrous sulfate 325 (65 FE) MG tablet   furosemide 20 MG tablet Commonly known as: LASIX   lamoTRIgine  25 MG tablet Commonly known as: LAMICTAL    lidocaine  2 % solution Commonly known as: XYLOCAINE    lurasidone  40 MG Tabs tablet Commonly known as: LATUDA    metoCLOPramide  10 MG tablet Commonly known as: Reglan    naproxen  500 MG tablet Commonly known as: NAPROSYN    ondansetron  4 MG disintegrating tablet Commonly known as: ZOFRAN -ODT   tiZANidine  4 MG tablet Commonly known as: Zanaflex        TAKE these medications      Indication  docusate sodium  100 MG capsule Commonly known as: COLACE Take 1 capsule (100 mg total) by mouth 2 (two) times daily.  Indication: Constipation   gabapentin  100 MG capsule Commonly known as: NEURONTIN  Take 2 capsules (200 mg total) by mouth 2 (two) times daily. What changed:  medication strength how much to take Another medication with the same name was removed. Continue taking this medication, and follow the directions you see here.  Indication: Generalized  Anxiety Disorder   haloperidol  20 MG tablet Commonly known as: HALDOL  Take 1 tablet (20 mg total) by mouth at bedtime.  Indication: Psychosis   hydrOXYzine  25 MG tablet Commonly known as: ATARAX  Take 1 tablet (25 mg total) by mouth 3 (three) times daily as needed for anxiety.  Indication: Feeling Anxious   nicotine  14 mg/24hr patch Commonly known as: NICODERM CQ  - dosed in mg/24 hours Place 1 patch (14 mg total) onto the skin daily. Start taking on: Aug 18, 2023  Indication: Nicotine  Addiction   traZODone  100 MG tablet Commonly known as: DESYREL  Take 1 tablet (100 mg total) by mouth at bedtime as needed for sleep. What changed:  medication strength how much to take  Indication: Trouble Sleeping   Vitamin D  (Ergocalciferol ) 1.25 MG (50000 UNIT) Caps capsule Commonly known as: DRISDOL  Take 1 capsule (50,000 Units total) by mouth daily. Start taking on: Aug 18, 2023  Indication: Vitamin D  Deficiency        Follow-up Information     [facility] [facility] Go on 08/18/2023.   Why: Please go to this provider on 08/18/23 at 9:00 am for an assessment, to schedule a hospital follow up appointment for therapy and medication management services, Contact information: [address] [place] [address] [phone]                 Follow-up recommendations:   Discharge Recommendations:    The patient is being discharged with her family.   Patient is to take her discharge medications as ordered. ?See follow up above.   We recommend that she participates in individual therapy to target uncontrollable agitation and substance abuse.    We recommend that she participates in therapy to target the conflict with her family, to improve communication skills and conflict resolution skills. ?patient is to initiate/implement a contingency based behavioral model to address patient's behavior.   We recommend that she gets AIMS scale, height, weight, blood pressure, fasting  lipid panel, fasting blood sugar in three months from discharge if she's on atypical antipsychotics.    Patient will benefit from monitoring of recurrent suicidal ideation since patient is on antidepressant medication.   The patient should abstain from all illicit substances and alcohol.   If the patient's symptoms  worsen or do not continue to improve or if the patient becomes actively suicidal or homicidal then it is recommended that the patient return to the closest hospital emergency room or call 911 for further evaluation and treatment. National Suicide Prevention Lifeline 1800-SUICIDE or 570-421-7527.   Please follow up with your primary medical doctor for all other medical needs.    The patient has been educated on the possible side effects to medications and she/her guardian is to contact a medical professional and inform outpatient provider of any new side effects of medication.   She is to take regular diet and activity as tolerated. ?Will benefit from moderate daily exercise.   Patient was educated about removing/locking any firearms, medications or dangerous products from the home.   Activity:  As tolerated   Diet:  Regular Diet   Signed:  Laurence Pons, FNP 08/17/2023, 10:36 AM

## 2023-08-17 NOTE — Progress Notes (Addendum)
  [facility] Adult Case Management Discharge Plan :  Will you be returning to the same living situation after discharge:  Yes,  patient will return home At discharge, do you have transportation home?: Yes,  CSW arranged transportation through [facility] for 1 PM.  Valerie Lee (daughter) [phone] confirmed that she will be home when patient arrives. Do you have the ability to pay for your medications: Yes,  patient has insurance  Release of information consent forms completed and in the chart;  Patient's signature needed at discharge.  Patient to Follow up at:  Follow-up Information     [facility] Go on 08/18/2023.   Why: Please go to this provider on 08/18/23 at 9:00 am for an assessment, to schedule a hospital follow up appointment for therapy and medication management services, Contact information: [address] [place] [address] [phone]                 Next level of care provider has access to [facility] Link:no  Safety Planning and Suicide Prevention discussed: Yes,  Valerie Lee (younger daughter) [phone]  Has patient been referred to the Quitline?: Yes, referral submitted (Quitline website).  Print-out given.  Patient has been referred for addiction treatment:  Patient tested positive for marijuana at admission.  Patient said that she would like to quit using, and will follow up with an outpatient therapist.  Patient said that she will go to NA meetings.     Valerie Lee O Valerie Lee, LCSWA 08/17/2023, 9:34 AM

## 2023-08-17 NOTE — Group Note (Signed)
 LCSW Group Therapy Note   Group Date: 08/17/2023 Start Time: 1100 End Time: 1200   Participation:  did not attend  Type of Therapy:  Group Therapy  Topic:  Shining from Within:  Confidence and Self-love Journey  Objective:  To support participants in developing confidence and self-love through self-awareness, self-compassion, and practical skills that nurture personal growth.   Group Goals Encourage self-reflection and self-acceptance by identifying personal strengths and achievements. Teach skills to challenge negative self-talk and replace it with supportive, truthful self-talk. Foster resilience and self-worth through [employer], gratitude, and self-care practices.   Summary:  This group explores the connection between confidence and self-love by guiding participants through reflection, mindset shifts, and practical tools like affirmations, strength recognition, and goal-setting. Activities are designed to promote self-compassion, build emotional resilience, and normalize the slow, patient journey of inner growth.   Therapeutic Modalities Used: Elements of Cognitive Behavioral Therapy (CBT): Challenging and reframing unhelpful self-talk. Elements of Motivational Interviewing (MI): Encouraging small, achievable goals. Elements of Dialectical Behavioral Therapist (DBT):  Mindfulness and Self-Compassion: Promoting present-moment awareness and kindness toward self.   Jillisa Harris O Ashawna Hanback, LCSWA 08/17/2023  12:12 PM

## 2023-08-17 NOTE — Progress Notes (Signed)
   08/17/23 0558  15 Minute Checks  Location Bedroom  Visual Appearance Calm  Behavior Composed  Sleep (Behavioral Health Patients Only)  Calculate sleep? (Click Yes once per 24 hr at 0600 safety check) Yes  Documented sleep last 24 hours 8

## 2023-08-17 NOTE — Care Management Important Message (Signed)
 Patient informed of right to appeal discharge, provided phone number to [employer]. Patient expressed no interest in appealing discharge at this time. CSW will continue to monitor situation.   Blondine Hottel, LCSWA 08/17/2023

## 2023-08-17 NOTE — BHH Suicide Risk Assessment (Signed)
 [facility] Discharge Suicide Risk Assessment   Principal Problem: Paranoid schizophrenia [facility]) Discharge Diagnoses: Principal Problem:   Paranoid schizophrenia (HCC) Active Problems:   MDD (major depressive disorder), recurrent severe, without psychosis (HCC)   Major depressive disorder, single episode, severe with psychotic features (HCC)   Total Time spent with patient: 30 minutes  Musculoskeletal: Strength & Muscle Tone: within normal limits Gait & Station: normal Patient leans: N/A  Psychiatric Specialty Exam  Presentation  General Appearance and behavior:  Casually dressed, not in any distress, appropriate behavior, engaged politely.  No EPS.  Eye Contact: Good.  Speech: Spontaneous.  Normal rate, tone and volume.  Normal prosody of speech.  Mood and Affect  Mood: Euthymic.  Affect: Full range and appropriate.  Thought Process  Thought Processes: Linear and goal directed.  Descriptions of Associations:Intact  Orientation:Full (Time, Place and Person)  Thought Content: Future oriented.  No current suicidal thoughts.  No homicidal thoughts.  No thoughts of violence.  No negative ruminative flooding.  No guilty ruminations.  No delusional theme.  No obsessions.  Hallucinations: No hallucination in any modality.  Sensorium  Memory: Good.  Judgment: Good.  Insight: Good  Executive Functions  Concentration: Good.  Attention Span: Good.  Recall: Good.  Fund of Knowledge: Good.  Language: Good   Psychomotor Activity  Normal psychomotor activity    Physical Exam: Physical Exam ROS Blood pressure 108/65, pulse 93, temperature 98.8 F (37.1 C), temperature source Oral, resp. rate 16, height 5' (1.524 m), weight 59.3 kg, last menstrual period 04/12/2011, SpO2 99%. Body mass index is 25.55 kg/m.  Mental Status Per Nursing Assessment::   On Admission:  NA  Demographic Factors:  NA  Loss Factors: Financial problems/change in  socioeconomic status  Historical Factors: Family history of mental illness or substance abuse  Risk Reduction Factors:   Sense of responsibility to family, Positive social support, Positive therapeutic relationship, and Positive coping skills or problem solving skills  Continued Clinical Symptoms:  Psychosis has completely resolved.  Patient is tolerating her medications well.  She is more insightful about her illness.  She understands the role of medication in stabilizing her.  She plans to take her medication on a regular basis.  Negative influence with respect to her medication was fueled by a previous relationship which she is no longer engaged.  Her family is very supportive.  No overwhelming anxiety.  Sleep-wake cycle is regulated.  Other biological readings are well-regulated.  Cognitive Features That Contribute To Risk:  None    Suicide Risk:  Minimal: No current suicidal thoughts.  No current homicidal thoughts.  No current thoughts of violence.  Modifiable risk factor targeted during this admission as psychosis.  She is doing well with her current regimen.  Psychosis has completely resolved.  She has good support from her family.  No issues with addiction. Patient is stable for care at the lower setting.  Follow-up Information     [facility] Go on 08/18/2023.   Why: Please go to this provider on 08/18/23 at 9:00 am for an assessment, to schedule a hospital follow up appointment for therapy and medication management services, Contact information: [address] [place] [address] [phone]                 Plan Of Care/Follow-up recommendations:  See discharge summary.  Amelie Jury, MD 08/17/2023, 11:06 AM

## 2023-09-03 ENCOUNTER — Emergency Department (HOSPITAL_COMMUNITY): Payer: Self-pay

## 2023-09-03 ENCOUNTER — Emergency Department (HOSPITAL_COMMUNITY)
Admission: EM | Admit: 2023-09-03 | Discharge: 2023-09-03 | Disposition: A | Discharge status: Home / Self Care | Payer: Self-pay | Attending: Emergency Medicine | Admitting: Emergency Medicine

## 2023-09-03 ENCOUNTER — Other Ambulatory Visit: Payer: Self-pay

## 2023-09-03 ENCOUNTER — Encounter (HOSPITAL_COMMUNITY): Payer: Self-pay

## 2023-09-03 DIAGNOSIS — W2209XA Striking against other stationary object, initial encounter: Secondary | ICD-10-CM | POA: Insufficient documentation

## 2023-09-03 DIAGNOSIS — M79645 Pain in left finger(s): Secondary | ICD-10-CM | POA: Insufficient documentation

## 2023-09-03 DIAGNOSIS — S63619A Unspecified sprain of unspecified finger, initial encounter: Secondary | ICD-10-CM

## 2023-09-03 NOTE — ED Triage Notes (Signed)
 Pt arrives via POV. Pt c/o pain to left index finger after punching a wall about 1 week ago. Pt AxOx4.

## 2023-09-03 NOTE — ED Provider Notes (Signed)
 [place] EMERGENCY DEPARTMENT AT [facility] Provider Note   CSN: [id number] Arrival date & time: 09/03/23  0749     History  Chief Complaint  Patient presents with   Finger Injury    Valerie Lee is a 48 y.o. female.  48 year old female presents with left hand pain after punching a wall about a week ago.  States the pain is localized to her distal part of her left index finger as well as her third digit.  Trouble making a fist related to trouble flexing her left index finger.  Patient states that she was at behavioral Hospital and had a incident where she became upset and then punched a wall.  Patient has noted hand pain since then.  No numbness to her fingertips.  No treatment use prior to arrival       Home Medications Prior to Admission medications   Medication Sig Start Date End Date Taking? Authorizing Provider  docusate sodium  (COLACE) 100 MG capsule Take 1 capsule (100 mg total) by mouth 2 (two) times daily. 08/17/23   Ntuen, Tina C, FNP  gabapentin  (NEURONTIN ) 100 MG capsule Take 2 capsules (200 mg total) by mouth 2 (two) times daily. 08/17/23   Ntuen, Tina C, FNP  haloperidol  (HALDOL ) 20 MG tablet Take 1 tablet (20 mg total) by mouth at bedtime. 08/17/23   Ntuen, Tina C, FNP  hydrOXYzine  (ATARAX ) 25 MG tablet Take 1 tablet (25 mg total) by mouth 3 (three) times daily as needed for anxiety. 08/17/23   Ntuen, Tina C, FNP  nicotine  (NICODERM CQ  - DOSED IN MG/24 HOURS) 14 mg/24hr patch Place 1 patch (14 mg total) onto the skin daily. 08/18/23   Ntuen, Tina C, FNP  traZODone  (DESYREL ) 100 MG tablet Take 1 tablet (100 mg total) by mouth at bedtime as needed for sleep. 08/17/23   Ntuen, Dorinda Garland, FNP  Vitamin D , Ergocalciferol , (DRISDOL ) 1.25 MG (50000 UNIT) CAPS capsule Take 1 capsule (50,000 Units total) by mouth daily. 08/18/23   Ntuen, Tina C, FNP      Allergies    Bactrim, Blue dyes (parenteral), Codeine, Penicillins, Aspirin, and Ibuprofen    Review of Systems    Review of Systems  All other systems reviewed and are negative.   Physical Exam Updated Vital Signs BP 121/64 (BP Location: Right Arm)   Pulse 68   Temp 98.1 F (36.7 C) (Oral)   Resp 16   LMP 04/12/2011 Comment: neg hcg on 07/25/2019  SpO2 100%  Physical Exam Vitals and nursing note reviewed.  Constitutional:      Appearance: She is well-developed. She is not toxic-appearing.  HENT:     Head: Normocephalic and atraumatic.  Eyes:     Conjunctiva/sclera: Conjunctivae normal.     Pupils: Pupils are equal, round, and reactive to light.  Cardiovascular:     Rate and Rhythm: Normal rate.  Pulmonary:     Effort: Pulmonary effort is normal.  Musculoskeletal:     Left hand: Tenderness present. Decreased range of motion.     Cervical back: Normal range of motion.     Comments: Tenderness to palpation along entirety of left index finger.  Difficulty with flexion of the left next finger due to pain.  Sensation normal.  Skin:    General: Skin is warm and dry.  Neurological:     Mental Status: She is alert and oriented to person, place, and time.     ED Results / Procedures / Treatments  Labs (all labs ordered are listed, but only abnormal results are displayed) Labs Reviewed - No data to display  EKG None  Radiology No results found.  Procedures Procedures    Medications Ordered in ED Medications - No data to display  ED Course/ Medical Decision Making/ A&P                                 Medical Decision Making Amount and/or Complexity of Data Reviewed Radiology: ordered.   Left hand x-ray negative.          Final Clinical Impression(s) / ED Diagnoses Final diagnoses:  None    Rx / DC Orders ED Discharge Orders     None         Lind Repine, MD 09/03/23 [phone]

## 2024-04-26 ENCOUNTER — Encounter (HOSPITAL_COMMUNITY): Payer: Self-pay

## 2024-04-26 ENCOUNTER — Emergency Department (HOSPITAL_COMMUNITY)
Admission: EM | Admit: 2024-04-26 | Discharge: 2024-04-26 | Disposition: A | Discharge status: Home / Self Care | Payer: Self-pay | Attending: Emergency Medicine | Admitting: Emergency Medicine

## 2024-04-26 ENCOUNTER — Emergency Department (HOSPITAL_COMMUNITY): Payer: Self-pay

## 2024-04-26 ENCOUNTER — Other Ambulatory Visit: Payer: Self-pay

## 2024-04-26 DIAGNOSIS — J181 Lobar pneumonia, unspecified organism: Secondary | ICD-10-CM | POA: Insufficient documentation

## 2024-04-26 DIAGNOSIS — D72829 Elevated white blood cell count, unspecified: Secondary | ICD-10-CM | POA: Insufficient documentation

## 2024-04-26 DIAGNOSIS — J189 Pneumonia, unspecified organism: Secondary | ICD-10-CM

## 2024-04-26 DIAGNOSIS — J069 Acute upper respiratory infection, unspecified: Secondary | ICD-10-CM | POA: Insufficient documentation

## 2024-04-26 LAB — RESP PANEL BY RT-PCR (RSV, FLU A&B, COVID)  RVPGX2
Influenza A by PCR: NEGATIVE
Influenza B by PCR: NEGATIVE
Resp Syncytial Virus by PCR: NEGATIVE
SARS Coronavirus 2 by RT PCR: NEGATIVE

## 2024-04-26 LAB — CBC
HCT: 35.8 % — ABNORMAL LOW (ref 36.0–46.0)
Hemoglobin: 12 g/dL (ref 12.0–15.0)
MCH: 33.5 pg (ref 26.0–34.0)
MCHC: 33.5 g/dL (ref 30.0–36.0)
MCV: 100 fL (ref 80.0–100.0)
Platelets: 264 10*3/uL (ref 150–400)
RBC: 3.58 MIL/uL — ABNORMAL LOW (ref 3.87–5.11)
RDW: 12.4 % (ref 11.5–15.5)
WBC: 13.8 10*3/uL — ABNORMAL HIGH (ref 4.0–10.5)
nRBC: 0 % (ref 0.0–0.2)

## 2024-04-26 LAB — TROPONIN T, HIGH SENSITIVITY: Troponin T High Sensitivity: <6 ng/L (ref 0–19)

## 2024-04-26 LAB — BASIC METABOLIC PANEL WITH GFR
Anion gap: 10 (ref 5–15)
BUN: 11 mg/dL (ref 6–20)
CO2: 23 mmol/L (ref 22–32)
Calcium: 9.3 mg/dL (ref 8.9–10.3)
Chloride: 106 mmol/L (ref 98–111)
Creatinine, Ser: 0.88 mg/dL (ref 0.44–1.00)
GFR, Estimated: >60 mL/min
Glucose, Bld: 86 mg/dL (ref 70–99)
Potassium: 4.5 mmol/L (ref 3.5–5.1)
Sodium: 139 mmol/L (ref 135–145)

## 2024-04-26 MED ORDER — ACETAMINOPHEN 325 MG PO TABS
650.0000 mg | ORAL_TABLET | Freq: Once | ORAL | Status: AC
  Administered 2024-04-26: 650 mg via ORAL
  Filled 2024-04-26: qty 2

## 2024-04-26 MED ORDER — ONDANSETRON 4 MG PO TBDP
8.0000 mg | ORAL_TABLET | Freq: Once | ORAL | Status: AC
  Administered 2024-04-26: 8 mg via ORAL
  Filled 2024-04-26: qty 2

## 2024-04-26 MED ORDER — ACETAMINOPHEN 500 MG PO TABS: 500.0000 mg | ORAL_TABLET | Freq: Four times a day (QID) | ORAL | 0 refills | Status: AC | PRN

## 2024-04-26 MED ORDER — LOPERAMIDE HCL 2 MG PO CAPS: 4.0000 mg | ORAL_CAPSULE | Freq: Two times a day (BID) | ORAL | 0 refills | Status: AC | PRN

## 2024-04-26 MED ORDER — ONDANSETRON 8 MG PO TBDP: 8.0000 mg | ORAL_TABLET | Freq: Three times a day (TID) | ORAL | 0 refills | Status: AC | PRN

## 2024-04-26 MED ORDER — PREDNISONE 10 MG PO TABS: 40.0000 mg | ORAL_TABLET | Freq: Every day | ORAL | 0 refills | Status: AC

## 2024-04-26 MED ORDER — DEXAMETHASONE SOD PHOSPHATE PF 10 MG/ML IJ SOLN: 10.0000 mg | Freq: Once | INTRAMUSCULAR | Status: DC

## 2024-04-26 MED ORDER — AZITHROMYCIN 250 MG PO TABS: 250.0000 mg | ORAL_TABLET | Freq: Every day | ORAL | 0 refills | Status: AC

## 2024-04-26 NOTE — Discharge Instructions (Signed)
 We think what you have is a viral syndrome, that might be turning into lung pneumonia.  Take the medications prescribed.  Hydrate well and get some rest.  See your primary care doctor in 1 week if the symptoms dont improve. Return to the emergency room if your symptoms get worse -such as worsening shortness of breath, severe dehydration, dizziness or near fainting, severe nausea and vomiting.

## 2024-04-26 NOTE — ED Provider Notes (Signed)
 " [place] EMERGENCY DEPARTMENT AT [facility] Provider Note   CSN: [id number] Arrival date & time: 04/26/24  9194     Patient presents with: Headache, URI, Diarrhea, and Chest Pain   Valerie Lee is a 49 y.o. female.   HPI     48 patient with no concerning past medical history comes to the ER with complains of congestion, sore throat, night sweats, productive cough, myalgias, subjective fevers and headache for 6 days.  Patient is also having nausea, vomiting and diarrhea.  Patient has no acute neurologic symptoms such as one-sided weakness, numbness, slurred speech.  She is accompanied by wife, who provides additional history.  Patient went to work today, and just not improving and the headache was getting worse, therefore he decided to come to the ER.      Prior to Admission medications  Medication Sig Start Date End Date Taking? Authorizing Provider  acetaminophen  (TYLENOL ) 500 MG tablet Take 1 tablet (500 mg total) by mouth every 6 (six) hours as needed. 04/26/24  Yes Jakaylah Schlafer, Burgess, MD  azithromycin  (ZITHROMAX ) 250 MG tablet Take 1 tablet (250 mg total) by mouth daily. Take first 2 tablets together, then 1 every day until finished. 04/26/24  Yes Charlyn Burgess, MD  loperamide  (IMODIUM ) 2 MG capsule Take 2 capsules (4 mg total) by mouth 2 (two) times daily as needed for diarrhea or loose stools. 04/26/24  Yes Charlyn Burgess, MD  ondansetron  (ZOFRAN -ODT) 8 MG disintegrating tablet Take 1 tablet (8 mg total) by mouth every 8 (eight) hours as needed for nausea. 04/26/24  Yes Charlyn Burgess, MD  predniSONE  (DELTASONE ) 10 MG tablet Take 4 tablets (40 mg total) by mouth daily. 04/26/24  Yes Charlyn Burgess, MD  docusate sodium  (COLACE) 100 MG capsule Take 1 capsule (100 mg total) by mouth 2 (two) times daily. 08/17/23   Ntuen, Tina C, FNP  gabapentin  (NEURONTIN ) 100 MG capsule Take 2 capsules (200 mg total) by mouth 2 (two) times daily. 08/17/23   Ntuen, Tina C, FNP   haloperidol  (HALDOL ) 20 MG tablet Take 1 tablet (20 mg total) by mouth at bedtime. 08/17/23   Ntuen, Tina C, FNP  hydrOXYzine  (ATARAX ) 25 MG tablet Take 1 tablet (25 mg total) by mouth 3 (three) times daily as needed for anxiety. 08/17/23   Ntuen, Tina C, FNP  nicotine  (NICODERM CQ  - DOSED IN MG/24 HOURS) 14 mg/24hr patch Place 1 patch (14 mg total) onto the skin daily. 08/18/23   Ntuen, Tina C, FNP  traZODone  (DESYREL ) 100 MG tablet Take 1 tablet (100 mg total) by mouth at bedtime as needed for sleep. 08/17/23   Ntuen, Ellouise BROCKS, FNP  Vitamin D , Ergocalciferol , (DRISDOL ) 1.25 MG (50000 UNIT) CAPS capsule Take 1 capsule (50,000 Units total) by mouth daily. 08/18/23   Ntuen, Tina C, FNP    Allergies: Bactrim, Blue dyes (parenteral), Codeine, Penicillins, Aspirin, and Ibuprofen    Review of Systems  Updated Vital Signs BP 120/67   Pulse 69   Temp 98.6 F (37 C)   Resp 16   Ht 5' 1 (1.549 m)   Wt 52.2 kg   LMP 04/12/2011 Comment: neg hcg on 07/25/2019  SpO2 100%   BMI 21.73 kg/m   Physical Exam Vitals and nursing note reviewed.  Constitutional:      Appearance: She is well-developed. She is ill-appearing. She is not toxic-appearing.  HENT:     Head: Atraumatic.  Cardiovascular:     Rate and Rhythm: Normal rate.  Pulmonary:     Effort: Pulmonary effort is normal.  Musculoskeletal:     Cervical back: Neck supple.  Lymphadenopathy:     Cervical: No cervical adenopathy.  Skin:    General: Skin is warm and dry.  Neurological:     Mental Status: She is alert and oriented to person, place, and time.     (all labs ordered are listed, but only abnormal results are displayed) Labs Reviewed  CBC - Abnormal; Notable for the following components:      Result Value   WBC 13.8 (*)    RBC 3.58 (*)    HCT 35.8 (*)    All other components within normal limits  RESP PANEL BY RT-PCR (RSV, FLU A&B, COVID)  RVPGX2  BASIC METABOLIC PANEL WITH GFR  TROPONIN T, HIGH SENSITIVITY  TROPONIN T,  HIGH SENSITIVITY    EKG: EKG Interpretation Date/Time:  Friday April 26 2024 08:34:41 EST Ventricular Rate:  62 PR Interval:  146 QRS Duration:  74 QT Interval:  384 QTC Calculation: 389 R Axis:   77  Text Interpretation: Normal sinus rhythm Normal ECG When compared with ECG of 05-Aug-2023 16:39, PREVIOUS ECG IS PRESENT Confirmed by Charlyn Sora ([id number]) on 04/26/2024 10:39:38 AM  Radiology: [facility] Chest 2 View Result Date: 04/26/2024 EXAM: PA AND LATERAL (2) VIEW(S) XRAY OF THE CHEST 04/26/2024 09:03:00 AM COMPARISON: PA and lateral radiographs of the chest dated 07/25/2019. CLINICAL HISTORY: Chest pain. FINDINGS: LUNGS AND PLEURA: No focal pulmonary opacity. No pleural effusion. No pneumothorax. HEART AND MEDIASTINUM: The cardiac silhouette is mildly prominent and has increased in size since the previous study. BONES AND SOFT TISSUES: No acute osseous abnormality. IMPRESSION: 1. Mildly prominent cardiac silhouette, increased in size compared to 07/25/2019. Electronically signed by: Evalene Coho MD 04/26/2024 09:21 AM EST RP Workstation: [id number]     Procedures   Medications Ordered in the ED  acetaminophen  (TYLENOL ) tablet 650 mg (has no administration in time range)  ondansetron  (ZOFRAN -ODT) disintegrating tablet 8 mg (has no administration in time range)    Clinical Course as of 04/26/24 1055  Fri Apr 26, 2024  1054 DG Chest 2 View Patient's white count is slightly elevated.  Her flu and COVID test are negative.  Chest x-ray Nuprin interpreted, there is some left-sided increase haziness at the base.  Given that patient's symptoms have now been present for at least 5 days, and she feels like she is getting worse, there could be evolving pneumonia.  We will give her antibiotics.  She has passed oral challenge.  She is nontoxic.  Return precautions discussed. [AN]    Clinical Course User Index [AN] Charlyn Sora, MD                                 Medical Decision  Making Amount and/or Complexity of Data Reviewed Labs: ordered. Radiology: ordered.  Risk OTC drugs. Prescription drug management.   Patient appears ill, but she is non toxic, no respiratory distress, vital signs are as noted.  Throat and pharynx are normal in appearance.  Neck is supple.  There is no meningismus.. The chest is clear, mild wheezing over the left side noted   Differential diagnosis considered for this patient includes: Viral syndrome Influenza Pharyngitis Sinusitis Mononucleosis Electrolyte abnormality   ASSESSMENT:  viral upper respiratory illness with possibly evolving pneumonia  Final diagnoses:  Viral URI with cough  Community acquired pneumonia of left lower lobe  of lung    ED Discharge Orders          Ordered    acetaminophen  (TYLENOL ) 500 MG tablet  Every 6 hours PRN        04/26/24 1045    azithromycin  (ZITHROMAX ) 250 MG tablet  Daily        04/26/24 1045    loperamide  (IMODIUM ) 2 MG capsule  2 times daily PRN        04/26/24 1048    ondansetron  (ZOFRAN -ODT) 8 MG disintegrating tablet  Every 8 hours PRN        04/26/24 1048    predniSONE  (DELTASONE ) 10 MG tablet  Daily        04/26/24 1051               Charlyn Sora, MD 04/26/24 1056  "

## 2024-04-26 NOTE — ED Triage Notes (Signed)
 Pt reporting headache on her right side primarily right sided ear pain. Pt reporting diarrhea and body aches.

## 2024-05-02 ENCOUNTER — Emergency Department (HOSPITAL_COMMUNITY): Payer: Self-pay

## 2024-05-02 ENCOUNTER — Encounter (HOSPITAL_COMMUNITY): Payer: Self-pay

## 2024-05-02 ENCOUNTER — Other Ambulatory Visit: Payer: Self-pay

## 2024-05-02 ENCOUNTER — Emergency Department (HOSPITAL_COMMUNITY)
Admission: EM | Admit: 2024-05-02 | Discharge: 2024-05-02 | Disposition: A | Discharge status: Home / Self Care | Payer: Self-pay | Source: Home / Self Care | Attending: Emergency Medicine | Admitting: Emergency Medicine

## 2024-05-02 DIAGNOSIS — R079 Chest pain, unspecified: Secondary | ICD-10-CM

## 2024-05-02 LAB — CBC
HCT: 39.8 % (ref 36.0–46.0)
Hemoglobin: 12.8 g/dL (ref 12.0–15.0)
MCH: 33.2 pg (ref 26.0–34.0)
MCHC: 32.2 g/dL (ref 30.0–36.0)
MCV: 103.1 fL — ABNORMAL HIGH (ref 80.0–100.0)
Platelets: 278 10*3/uL (ref 150–400)
RBC: 3.86 MIL/uL — ABNORMAL LOW (ref 3.87–5.11)
RDW: 12.6 % (ref 11.5–15.5)
WBC: 16 10*3/uL — ABNORMAL HIGH (ref 4.0–10.5)
nRBC: 0 % (ref 0.0–0.2)

## 2024-05-02 LAB — BASIC METABOLIC PANEL WITH GFR
Anion gap: 10 (ref 5–15)
BUN: 11 mg/dL (ref 6–20)
CO2: 24 mmol/L (ref 22–32)
Calcium: 9.5 mg/dL (ref 8.9–10.3)
Chloride: 106 mmol/L (ref 98–111)
Creatinine, Ser: 0.81 mg/dL (ref 0.44–1.00)
GFR, Estimated: >60 mL/min
Glucose, Bld: 91 mg/dL (ref 70–99)
Potassium: 4.4 mmol/L (ref 3.5–5.1)
Sodium: 139 mmol/L (ref 135–145)

## 2024-05-02 LAB — TROPONIN T, HIGH SENSITIVITY
Troponin T High Sensitivity: <6 ng/L (ref 0–19)
Troponin T High Sensitivity: <6 ng/L (ref 0–19)

## 2024-05-02 LAB — D-DIMER, QUANTITATIVE: D-Dimer, Quant: <0.27 ug{FEU}/mL (ref 0.00–0.50)

## 2024-05-02 MED ORDER — MORPHINE SULFATE (PF) 2 MG/ML IV SOLN
4.0000 mg | Freq: Once | INTRAVENOUS | Status: AC
  Administered 2024-05-02: 4 mg via INTRAVENOUS
  Filled 2024-05-02: qty 2

## 2024-05-02 MED ORDER — NAPROXEN 500 MG PO TABS: 500.0000 mg | ORAL_TABLET | Freq: Two times a day (BID) | ORAL | 0 refills | Status: AC

## 2024-05-02 MED ORDER — ONDANSETRON HCL 4 MG/2ML IJ SOLN
4.0000 mg | Freq: Once | INTRAMUSCULAR | Status: AC
  Administered 2024-05-02: 4 mg via INTRAVENOUS
  Filled 2024-05-02: qty 2

## 2024-05-02 NOTE — ED Notes (Signed)
 Family up to desk asking about wait. Pt continues to c/o pain. Pt, labs, imaging, VS reviewed. Pt/family updated, reassured.

## 2024-05-02 NOTE — Discharge Instructions (Addendum)
 Complete the antibiotic prescription that was sent in when you were seen last in addition to that begin taking Naprosyn  twice daily to help with pain and inflammation.  You can also use Biofreeze gel or apply heat over the area.  Your labs and chest x-ray today were very reassuring and do not suggest an acute problem with your heart or lungs.  I suspect that this is likely inflammation of the cartilage between your ribs and chest bone which can happen after recent cold or infection.

## 2024-05-02 NOTE — ED Provider Triage Note (Signed)
 Emergency Medicine Provider Triage Evaluation Note  Valerie Lee , a 49 y.o. female  was evaluated in triage.  Pt complains of chest pain, cough, congestion. Reports she was here recently for same, diagnosed with pneumonia, however was unable to fill the antibiotics and therefore symptoms are worse.  Review of Systems  Positive:  Negative:   Physical Exam  BP 110/70   Pulse 70   Temp 98 F (36.7 C) (Oral)   Resp 16   LMP 04/12/2011 Comment: neg hcg on 07/25/2019  SpO2 98%  Gen:   Awake, no distress   Resp:  Normal effort  MSK:   Moves extremities without difficulty  Other:    Medical Decision Making  Medically screening exam initiated at 2:00 AM.  Appropriate orders placed.  Valerie Lee was informed that the remainder of the evaluation will be completed by another provider, this initial triage assessment does not replace that evaluation, and the importance of remaining in the ED until their evaluation is complete.     Nora Lauraine LABOR, PA-C 05/02/24 0201

## 2024-05-02 NOTE — ED Triage Notes (Signed)
 Pt BIB [facility] for chest pain with deep breaths and certain movements. Pt diagnosed with pneumonia last week but unable to get her antibiotic filled due to monetary concerns.

## 2024-05-02 NOTE — ED Provider Notes (Incomplete)
 " [place] EMERGENCY DEPARTMENT AT [facility] Provider Note   CSN: [id number] Arrival date & time: 05/02/24  0117     Patient presents with: Chest Pain   Valerie Lee is a 49 y.o. female.  {Add pertinent medical, surgical, social history, OB history to HPI:32947}  Chest Pain      Prior to Admission medications  Medication Sig Start Date End Date Taking? Authorizing Provider  acetaminophen  (TYLENOL ) 500 MG tablet Take 1 tablet (500 mg total) by mouth every 6 (six) hours as needed. 04/26/24   Charlyn Sora, MD  azithromycin  (ZITHROMAX ) 250 MG tablet Take 1 tablet (250 mg total) by mouth daily. Take first 2 tablets together, then 1 every day until finished. 04/26/24   Charlyn Sora, MD  docusate sodium  (COLACE) 100 MG capsule Take 1 capsule (100 mg total) by mouth 2 (two) times daily. 08/17/23   Ntuen, Tina C, FNP  gabapentin  (NEURONTIN ) 100 MG capsule Take 2 capsules (200 mg total) by mouth 2 (two) times daily. 08/17/23   Ntuen, Tina C, FNP  haloperidol  (HALDOL ) 20 MG tablet Take 1 tablet (20 mg total) by mouth at bedtime. 08/17/23   Ntuen, Tina C, FNP  hydrOXYzine  (ATARAX ) 25 MG tablet Take 1 tablet (25 mg total) by mouth 3 (three) times daily as needed for anxiety. 08/17/23   Ntuen, Tina C, FNP  loperamide  (IMODIUM ) 2 MG capsule Take 2 capsules (4 mg total) by mouth 2 (two) times daily as needed for diarrhea or loose stools. 04/26/24   Nanavati, Ankit, MD  nicotine  (NICODERM CQ  - DOSED IN MG/24 HOURS) 14 mg/24hr patch Place 1 patch (14 mg total) onto the skin daily. 08/18/23   Ntuen, Tina C, FNP  ondansetron  (ZOFRAN -ODT) 8 MG disintegrating tablet Take 1 tablet (8 mg total) by mouth every 8 (eight) hours as needed for nausea. 04/26/24   Charlyn Sora, MD  predniSONE  (DELTASONE ) 10 MG tablet Take 4 tablets (40 mg total) by mouth daily. 04/26/24   Charlyn Sora, MD  traZODone  (DESYREL ) 100 MG tablet Take 1 tablet (100 mg total) by mouth at bedtime as needed for sleep.  08/17/23   Ntuen, Ellouise BROCKS, FNP  Vitamin D , Ergocalciferol , (DRISDOL ) 1.25 MG (50000 UNIT) CAPS capsule Take 1 capsule (50,000 Units total) by mouth daily. 08/18/23   Ntuen, Tina C, FNP    Allergies: Bactrim, Blue dyes (parenteral), Codeine, Penicillins, Aspirin, and Ibuprofen    Review of Systems  Cardiovascular:  Positive for chest pain.    Updated Vital Signs BP (!) 112/58 (BP Location: Right Arm)   Pulse 62   Temp 98.6 F (37 C)   Resp 18   LMP 04/12/2011 Comment: neg hcg on 07/25/2019  SpO2 98%   Physical Exam  (all labs ordered are listed, but only abnormal results are displayed) Labs Reviewed  CBC - Abnormal; Notable for the following components:      Result Value   WBC 16.0 (*)    RBC 3.86 (*)    MCV 103.1 (*)    All other components within normal limits  BASIC METABOLIC PANEL WITH GFR  TROPONIN T, HIGH SENSITIVITY  TROPONIN T, HIGH SENSITIVITY    EKG: None  Radiology: DG Chest 2 View Result Date: 05/02/2024 EXAM: 2 VIEW(S) XRAY OF THE CHEST 05/02/2024 02:34:27 AM COMPARISON: 04/26/2024 CLINICAL HISTORY: Chest pain. FINDINGS: LUNGS AND PLEURA: No focal pulmonary opacity. No pleural effusion. No pneumothorax. HEART AND MEDIASTINUM: No acute abnormality of the cardiac and mediastinal silhouettes. BONES AND SOFT  TISSUES: No acute osseous abnormality. Cholecystectomy clips noted. IMPRESSION: 1. No acute cardiopulmonary pathology. Electronically signed by: Greig Pique MD 05/02/2024 02:49 AM EST RP Workstation: [id number]    {Document cardiac monitor, telemetry assessment procedure when appropriate:32947} Procedures   Medications Ordered in the ED - No data to display    {Click here for ABCD2, HEART and other calculators REFRESH Note before signing:1}                              Medical Decision Making Amount and/or Complexity of Data Reviewed Labs: ordered. Radiology: ordered.   ***  {Document critical care time when appropriate  Document review of labs and  clinical decision tools ie [id number], etc  Document your independent review of radiology images and any outside records  Document your discussion with family members, caretakers and with consultants  Document social determinants of health affecting pt's care  Document your decision making why or why not admission, treatments were needed:32947:::1}   Final diagnoses:  None    ED Discharge Orders     None        "
